# Patient Record
Sex: Female | Born: 1985 | Race: White | Hispanic: No | Marital: Single | Smoking: Never smoker
Health system: Southern US, Community
[De-identification: ages and names within clinical notes are randomized; demographics above are authoritative.]

## PROBLEM LIST (undated history)

## (undated) DIAGNOSIS — I471 Supraventricular tachycardia: Secondary | ICD-10-CM

## (undated) DIAGNOSIS — I469 Cardiac arrest, cause unspecified: Secondary | ICD-10-CM

## (undated) DIAGNOSIS — R001 Bradycardia, unspecified: Secondary | ICD-10-CM

## (undated) DIAGNOSIS — K5090 Crohn's disease, unspecified, without complications: Secondary | ICD-10-CM

## (undated) DIAGNOSIS — Z9289 Personal history of other medical treatment: Secondary | ICD-10-CM

## (undated) DIAGNOSIS — F329 Major depressive disorder, single episode, unspecified: Secondary | ICD-10-CM

## (undated) DIAGNOSIS — T7840XA Allergy, unspecified, initial encounter: Secondary | ICD-10-CM

## (undated) DIAGNOSIS — F32A Depression, unspecified: Secondary | ICD-10-CM

## (undated) DIAGNOSIS — G43909 Migraine, unspecified, not intractable, without status migrainosus: Secondary | ICD-10-CM

## (undated) DIAGNOSIS — D61818 Other pancytopenia: Secondary | ICD-10-CM

## (undated) DIAGNOSIS — Z8679 Personal history of other diseases of the circulatory system: Secondary | ICD-10-CM

## (undated) DIAGNOSIS — Z95810 Presence of automatic (implantable) cardiac defibrillator: Secondary | ICD-10-CM

## (undated) DIAGNOSIS — Z9889 Other specified postprocedural states: Secondary | ICD-10-CM

## (undated) HISTORY — PX: ABLATION: SHX5711

## (undated) HISTORY — DX: Migraine, unspecified, not intractable, without status migrainosus: G43.909

## (undated) HISTORY — DX: Other pancytopenia: D61.818

## (undated) HISTORY — DX: Crohn's disease, unspecified, without complications: K50.90

## (undated) HISTORY — DX: Supraventricular tachycardia: I47.1

## (undated) HISTORY — DX: Allergy, unspecified, initial encounter: T78.40XA

---
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
## 2012-06-29 ENCOUNTER — Other Ambulatory Visit: Payer: Self-pay | Admitting: Gastroenterology

## 2012-06-29 DIAGNOSIS — R109 Unspecified abdominal pain: Secondary | ICD-10-CM

## 2012-07-02 ENCOUNTER — Ambulatory Visit
Admission: RE | Admit: 2012-07-02 | Discharge: 2012-07-02 | Disposition: A | Discharge status: Home / Self Care | Payer: 59 | Source: Ambulatory Visit | Attending: Gastroenterology | Admitting: Gastroenterology

## 2012-07-02 DIAGNOSIS — R109 Unspecified abdominal pain: Secondary | ICD-10-CM

## 2012-09-10 DIAGNOSIS — Z9289 Personal history of other medical treatment: Secondary | ICD-10-CM

## 2012-09-10 HISTORY — DX: Personal history of other medical treatment: Z92.89

## 2012-09-15 ENCOUNTER — Telehealth: Payer: Self-pay | Admitting: Oncology

## 2012-09-15 NOTE — Telephone Encounter (Signed)
C/D 09/15/12 for appt. 09/23/12

## 2012-09-15 NOTE — Telephone Encounter (Signed)
S/W pt mother in re NP appt 11/14 @ 10:30 w/Dr. Alen Blew Referring Dr. Donnie Coffin Dx-Pancytopenia Welcome packet mailed.

## 2012-09-23 ENCOUNTER — Encounter: Payer: Self-pay | Admitting: Oncology

## 2012-09-23 ENCOUNTER — Telehealth: Payer: Self-pay | Admitting: Oncology

## 2012-09-23 ENCOUNTER — Other Ambulatory Visit (HOSPITAL_COMMUNITY)
Admission: RE | Admit: 2012-09-23 | Discharge: 2012-09-23 | Disposition: A | Discharge status: Home / Self Care | Payer: 59 | Source: Ambulatory Visit | Attending: Oncology | Admitting: Oncology

## 2012-09-23 ENCOUNTER — Ambulatory Visit (HOSPITAL_BASED_OUTPATIENT_CLINIC_OR_DEPARTMENT_OTHER): Payer: 59

## 2012-09-23 ENCOUNTER — Ambulatory Visit: Payer: 59 | Admitting: Lab

## 2012-09-23 ENCOUNTER — Other Ambulatory Visit (HOSPITAL_BASED_OUTPATIENT_CLINIC_OR_DEPARTMENT_OTHER): Payer: 59 | Admitting: Lab

## 2012-09-23 ENCOUNTER — Ambulatory Visit (HOSPITAL_BASED_OUTPATIENT_CLINIC_OR_DEPARTMENT_OTHER): Payer: 59 | Admitting: Oncology

## 2012-09-23 ENCOUNTER — Encounter (HOSPITAL_COMMUNITY)
Admission: RE | Admit: 2012-09-23 | Discharge: 2012-09-23 | Disposition: A | Discharge status: Home / Self Care | Payer: 59 | Source: Ambulatory Visit | Attending: Oncology | Admitting: Oncology

## 2012-09-23 VITALS — BP 111/69 | HR 68 | Temp 97.7°F | Resp 20 | Ht 65.5 in | Wt 176.9 lb

## 2012-09-23 DIAGNOSIS — D61818 Other pancytopenia: Secondary | ICD-10-CM | POA: Insufficient documentation

## 2012-09-23 DIAGNOSIS — D72820 Lymphocytosis (symptomatic): Secondary | ICD-10-CM

## 2012-09-23 DIAGNOSIS — E876 Hypokalemia: Secondary | ICD-10-CM

## 2012-09-23 DIAGNOSIS — C8589 Other specified types of non-Hodgkin lymphoma, extranodal and solid organ sites: Secondary | ICD-10-CM | POA: Insufficient documentation

## 2012-09-23 LAB — CBC WITH DIFFERENTIAL/PLATELET
BASO%: 0 % (ref 0.0–2.0)
Basophils Absolute: 0 10*3/uL (ref 0.0–0.1)
EOS%: 2 % (ref 0.0–7.0)
HGB: 5.5 g/dL — CL (ref 11.6–15.9)
MCH: 27 pg (ref 25.1–34.0)
MONO%: 7.4 % (ref 0.0–14.0)
RBC: 2.04 10*6/uL — ABNORMAL LOW (ref 3.70–5.45)
RDW: 14.6 % — ABNORMAL HIGH (ref 11.2–14.5)
lymph#: 1.7 10*3/uL (ref 0.9–3.3)
nRBC: 0 % (ref 0–0)

## 2012-09-23 LAB — COMPREHENSIVE METABOLIC PANEL (CC13)
ALT: <6 U/L (ref 0–55)
AST: 7 U/L (ref 5–34)
Albumin: 3.1 g/dL — ABNORMAL LOW (ref 3.5–5.0)
Alkaline Phosphatase: 47 U/L (ref 40–150)
Glucose: 95 mg/dl (ref 70–99)
Potassium: 2.3 mEq/L — CL (ref 3.5–5.1)
Sodium: 139 mEq/L (ref 136–145)
Total Bilirubin: 0.53 mg/dL (ref 0.20–1.20)
Total Protein: 7 g/dL (ref 6.4–8.3)

## 2012-09-23 LAB — ABO/RH: ABO/RH(D): O NEG

## 2012-09-23 LAB — TECHNOLOGIST REVIEW

## 2012-09-23 MED ORDER — POTASSIUM CHLORIDE ER 10 MEQ PO TBCR: 10.0000 meq | EXTENDED_RELEASE_TABLET | Freq: Two times a day (BID) | ORAL | Status: DC

## 2012-09-23 NOTE — Telephone Encounter (Signed)
appts made and printed for pt aom °

## 2012-09-23 NOTE — Progress Notes (Signed)
CC:   Shirley Friar, MD L. Lupe Carney, M.D.  REASON FOR CONSULTATION:  Pancytopenia.  HISTORY OF PRESENT ILLNESS:  Julie Saunders is a pleasant 26 year old woman with past medical history significant for Crohn disease.  She currently lives in [place] with her parents and was diagnosed with Crohn disease about 11 years ago.  At that time she had presented with malnutrition and appeared to be iron deficient and was losing hair and having diarrhea, and at that time, again, diagnosed with that particular disease.  She was treated with Pentasa on a regular basis. She also was started on Azasan a few years ago, but due to insurance issues, she had been off of it for an extended period of time.  She restarted the Azasan back on September 30th or so, as far as she can remember, due to a flare in her Crohn disease that had been up until that point under reasonable control.  Subsequent to that, in the last 2 months she has had major deterioration in her health including weakness, fatigue, tiredness, and progressive pancytopenia.  Her CBC back in September before she started this particular medication she had a white cell count of 14.5, hemoglobin of 12.5, and platelet count was 371. That was on 07/27/2012.  A lymphocyte percentage at that time was normal at 24.6.  A repeat CBC after that on 08/17/2012 showed her white cell count was down to 3.4, hemoglobin was 11.9, and platelet count of 114, after she had been on Azasan for about a few weeks.  A repeat CBC back on November 5th showed her white cell count of down to 2.4, hemoglobin was 7.3, and platelet count of 86.  At that time, her neutrophil percentage was up to 78.7.  At that time, she started developing more symptoms of weakness, fatigue, tiredness, more nausea, poor p.o. intake. The patient was referred to me for evaluation regarding her pancytopenia.  The patient was instructed to stop Azasan at that time and she is  scheduled for an endoscopy by Dr. Bosie Clos.  She presents today accompanied by her parents.  As mentioned, she has been reporting weakness, fatigue, tiredness.  She is not reporting any chest pain.  She is not reporting any shortness of breath, not reporting any syncopal episode, but really has very poor p.o. intake.  She takes soups.  She has not had any dizzy spells, but she had to use a wheelchair today in fear that she might fall.  She did not report any hematochezia, did not have any melena.  She did not report any hemoptysis, did not report any hematemesis.  REVIEW OF SYSTEMS:  Does not report any headaches, blurred vision, double vision.  Does not report any motor or sensory neuropathy.  Does not report any alteration in mental status.  Does not report any psychiatric issues.  Does report worsening headaches.  Does not report any chest pain, orthopnea, PND, palpitation.  Does not report any cough or hemoptysis.  Does not report any hematochezia.  Does not report any melena.  Does not report any abdominal pain.  Does not report any diarrhea.  Does not report any musculoskeletal complaints.  Does report weakness, fatigue, and generalized achiness.  Does not report any abdominal distention.  Rest of review of systems unremarkable.  PAST MEDICAL HISTORY:  Significant for Crohn disease, migraine headaches.  Also has history of allergies.  ALLERGIES:  Sulfa.  MEDICATIONS:  She is on Pentasa.  She is no longer taking Azasan  as of last week.  She is on folic acid, cetirizine, iron sulfate, Imitrex, Zofran, and Tylenol.  She is on Imodium AD as needed.  SOCIAL HISTORY:  She is single.  She lives with her parents.  Denied any alcohol or tobacco abuse.  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  FAMILY HISTORY:  Not significant for any blood disorders or any Crohn disease.  There is history of hypertension and diabetes.  PHYSICAL EXAM:  General:  Alert, awake, pale-appearing woman.  Did  not appear in any active distress.  Vital Signs:  Her blood pressure today is 111/69, pulse 68, respirations 20, weighs 176 pounds.  HEENT:  Head is normocephalic, atraumatic.  Pupils equal, round, and reactive to light.  Oral mucosa moist and pink.  Neck:  Supple without adenopathy. Heart:  Regular rate and rhythm.  S1 and S2.  Lungs:  Clear to auscultation without wheeze or dullness to percussion.  Abdomen:  Soft, nontender.  No hepatosplenomegaly.  Extremities:  No edema.  LABORATORY DATA:  Showed a hemoglobin of 5.5, white cell count of 2.4, platelet count of 42.  Her lymphocyte percentage is up to 68%, absolute neutrophil count of 500.  Peripheral smear was personally reviewed today, showed an overall generalized pancytopenia.  Could not appreciate any blasts.  Lymphocytes appeared reactive, slightly cleaved, but no evidence of any dysplasia.  Not any schistocytosis or red cell fragmentation or platelet clumping.  ASSESSMENT AND PLAN:  A 26 year old woman with the following issues:  1. Pancytopenia with lymphocytosis.  The differential diagnosis     discussed today with Tychelle and her parents.  I feel the most     likely offending agent is the immunosuppressive drug Azasan, or     azathioprine, or Imuran, a medication that is known to cause     progressive cytopenias and some progressive bone marrow     suppression.  However, she does have lymphocytosis.  I am going to     always believe the possibility of a lymphoproliferative disorder or     leukemia as a possibility.  I think these are unlikely, given the     fact that exactly less than 6 weeks ago she had a normal CBC and     the timing of starting this medication corresponds perfectly.  She     could also be developing an aplastic bone marrow related to that.     In terms of a management standpoint, I will send her peripheral     blood for flow cytometry to make sure it does not have a     lymphoproliferative disorder and I  will support her with     transfusion for the time being.  I have talked to her about a bone     marrow biopsy, if her counts do not recover in the next couple of     weeks.  All her questions were answered today.  She is agreeable to     the plan. 2. Hypokalemia.  Her potassium is low today.  It is probably related     to her poor nutrition, possibly due to her diarrhea.  I have given     her potassium supplements.  I have given her clear instructions     today if she develops any progressive weakness, fatigue, tiredness,     syncope chest pain, to present to the emergency department right     away as she is at risk of more severe metabolic derangements.  ______________________________ Benjiman Core, M.D. FNS/MEDQ  D:  09/23/2012  T:  09/23/2012  Job:  [id number]

## 2012-09-23 NOTE — Progress Notes (Signed)
Note dictated

## 2012-09-23 NOTE — Progress Notes (Signed)
Checked in new patient. No financial issues. °

## 2012-09-24 ENCOUNTER — Ambulatory Visit: Payer: 59

## 2012-09-24 VITALS — BP 105/61 | HR 79 | Temp 97.5°F | Resp 20

## 2012-09-24 DIAGNOSIS — D61818 Other pancytopenia: Secondary | ICD-10-CM

## 2012-09-24 LAB — PREPARE RBC (CROSSMATCH)

## 2012-09-24 MED ORDER — SODIUM CHLORIDE 0.9 % IV SOLN
250.0000 mL | Freq: Once | INTRAVENOUS | Status: AC
  Administered 2012-09-24: 250 mL via INTRAVENOUS

## 2012-09-24 NOTE — Patient Instructions (Addendum)
Blood Transfusion Information WHAT IS A BLOOD TRANSFUSION? A transfusion is the replacement of blood or some of its parts. Blood is made up of multiple cells which provide different functions.  Red blood cells carry oxygen and are used for blood loss replacement.  White blood cells fight against infection.  Platelets control bleeding.  Plasma helps clot blood.  Other blood products are available for specialized needs, such as hemophilia or other clotting disorders. BEFORE THE TRANSFUSION  Who gives blood for transfusions?   You may be able to donate blood to be used at a later date on yourself (autologous donation).  Relatives can be asked to donate blood. This is generally not any safer than if you have received blood from a stranger. The same precautions are taken to ensure safety when a relative's blood is donated.  Healthy volunteers who are fully evaluated to make sure their blood is safe. This is blood bank blood. Transfusion therapy is the safest it has ever been in the practice of medicine. Before blood is taken from a donor, a complete history is taken to make sure that person has no history of diseases nor engages in risky social behavior (examples are intravenous drug use or sexual activity with multiple partners). The donor's travel history is screened to minimize risk of transmitting infections, such as malaria. The donated blood is tested for signs of infectious diseases, such as HIV and hepatitis. The blood is then tested to be sure it is compatible with you in order to minimize the chance of a transfusion reaction. If you or a relative donates blood, this is often done in anticipation of surgery and is not appropriate for emergency situations. It takes many days to process the donated blood. RISKS AND COMPLICATIONS Although transfusion therapy is very safe and saves many lives, the main dangers of transfusion include:   Getting an infectious disease.  Developing a  transfusion reaction. This is an allergic reaction to something in the blood you were given. Every precaution is taken to prevent this. The decision to have a blood transfusion has been considered carefully by your caregiver before blood is given. Blood is not given unless the benefits outweigh the risks. AFTER THE TRANSFUSION  Right after receiving a blood transfusion, you will usually feel much better and more energetic. This is especially true if your red blood cells have gotten low (anemic). The transfusion raises the level of the red blood cells which carry oxygen, and this usually causes an energy increase.  The nurse administering the transfusion will monitor you carefully for complications. HOME CARE INSTRUCTIONS  No special instructions are needed after a transfusion. You may find your energy is better. Speak with your caregiver about any limitations on activity for underlying diseases you may have. SEEK MEDICAL CARE IF:   Your condition is not improving after your transfusion.  You develop redness or irritation at the intravenous (IV) site. SEEK IMMEDIATE MEDICAL CARE IF:  Any of the following symptoms occur over the next 12 hours:  Shaking chills.  You have a temperature by mouth above 102 F (38.9 C), not controlled by medicine.  Chest, back, or muscle pain.  People around you feel you are not acting correctly or are confused.  Shortness of breath or difficulty breathing.  Dizziness and fainting.  You get a rash or develop hives.  You have a decrease in urine output.  Your urine turns a dark color or changes to pink, red, or brown. Any of the following   symptoms occur over the next 10 days:  You have a temperature by mouth above 102 F (38.9 C), not controlled by medicine.  Shortness of breath.  Weakness after normal activity.  The white part of the eye turns yellow (jaundice).  You have a decrease in the amount of urine or are urinating less often.  Your  urine turns a dark color or changes to pink, red, or brown. Document Released: 10/24/2000 Document Revised: 01/19/2012 Document Reviewed: 06/12/2008 Exit[employer] Patient Information 2013 [employer].  

## 2012-09-25 LAB — TYPE AND SCREEN
Antibody Screen: NEGATIVE
Unit division: 0

## 2012-09-29 ENCOUNTER — Other Ambulatory Visit: Payer: Self-pay | Admitting: Gastroenterology

## 2012-09-30 ENCOUNTER — Other Ambulatory Visit: Payer: Self-pay | Admitting: Oncology

## 2012-09-30 DIAGNOSIS — D649 Anemia, unspecified: Secondary | ICD-10-CM

## 2012-10-06 ENCOUNTER — Other Ambulatory Visit (HOSPITAL_BASED_OUTPATIENT_CLINIC_OR_DEPARTMENT_OTHER): Payer: 59 | Admitting: Lab

## 2012-10-06 ENCOUNTER — Ambulatory Visit (HOSPITAL_BASED_OUTPATIENT_CLINIC_OR_DEPARTMENT_OTHER): Payer: 59 | Admitting: Oncology

## 2012-10-06 ENCOUNTER — Telehealth: Payer: Self-pay | Admitting: Oncology

## 2012-10-06 ENCOUNTER — Telehealth: Payer: Self-pay | Admitting: *Deleted

## 2012-10-06 VITALS — BP 118/77 | HR 85 | Temp 98.5°F | Resp 18 | Ht 65.5 in | Wt 179.2 lb

## 2012-10-06 DIAGNOSIS — D61818 Other pancytopenia: Secondary | ICD-10-CM

## 2012-10-06 DIAGNOSIS — D72820 Lymphocytosis (symptomatic): Secondary | ICD-10-CM

## 2012-10-06 DIAGNOSIS — D649 Anemia, unspecified: Secondary | ICD-10-CM

## 2012-10-06 DIAGNOSIS — E876 Hypokalemia: Secondary | ICD-10-CM

## 2012-10-06 LAB — CBC WITH DIFFERENTIAL/PLATELET
BASO%: 0.2 % (ref 0.0–2.0)
EOS%: 0.4 % (ref 0.0–7.0)
HCT: 27 % — ABNORMAL LOW (ref 34.8–46.6)
HGB: 9.2 g/dL — ABNORMAL LOW (ref 11.6–15.9)
MCH: 30.3 pg (ref 25.1–34.0)
MCHC: 34.1 g/dL (ref 31.5–36.0)
MONO#: 1.1 10*3/uL — ABNORMAL HIGH (ref 0.1–0.9)
NEUT%: 43.9 % (ref 38.4–76.8)
RDW: 16.3 % — ABNORMAL HIGH (ref 11.2–14.5)
WBC: 6.6 10*3/uL (ref 3.9–10.3)
lymph#: 2.6 10*3/uL (ref 0.9–3.3)

## 2012-10-06 LAB — COMPREHENSIVE METABOLIC PANEL (CC13)
ALT: 8 U/L (ref 0–55)
AST: 10 U/L (ref 5–34)
Albumin: 3 g/dL — ABNORMAL LOW (ref 3.5–5.0)
Alkaline Phosphatase: 54 U/L (ref 40–150)
BUN: 7 mg/dL (ref 7.0–26.0)
Calcium: 8.9 mg/dL (ref 8.4–10.4)
Chloride: 103 mEq/L (ref 98–107)
Potassium: 4.2 mEq/L (ref 3.5–5.1)
Sodium: 138 mEq/L (ref 136–145)
Total Protein: 6.9 g/dL (ref 6.4–8.3)

## 2012-10-06 NOTE — Progress Notes (Signed)
Hematology and Oncology Follow Up Visit  ADEA GEISEL [id number] Mar 21, 1986 26 y.o. 10/06/2012 8:43 AM No primary provider on file.No ref. provider found   Principle Diagnosis: 26 year old with pancytopenia due to Azasan, resolving now.    Prior Therapy: S/P 2 units of PRBC on 11/14 due to symptomatic anemia.   Current therapy: Supportive care.  Interim History: Ms. Julie Saunders presents today for a follow up visit. She is a nice women I saw back in on 11/14 for pancytopenia which was likely related to Azasan. When I saw her on 11/14 her hgb 5.5 and very symptomatic. After 2 units of transfusion, she felt a lot better. Her headaches has resolved. She is eating better. No diarrhea , weakness or vomiting. No bleeding noted.  Her energy and performance status has improved in the last 2 weeks.   Medications: I have reviewed the patient's current medications. Current outpatient prescriptions:acetaminophen (TYLENOL) 325 MG tablet, Take 650 mg by mouth every 6 (six) hours as needed., Disp: , Rfl: ;  cetirizine (ZYRTEC) 10 MG tablet, Take 10 mg by mouth daily., Disp: , Rfl: ;  fluticasone (FLONASE) 50 MCG/ACT nasal spray, Place 50 mcg into the nose Daily. Stop taking when symptoms improve, Disp: , Rfl: ;  folic acid (FOLVITE) 1 MG tablet, Take 1 mg by mouth daily., Disp: , Rfl:  loperamide (IMODIUM A-D) 2 MG tablet, Take 2 mg by mouth 4 (four) times daily as needed., Disp: , Rfl: ;  mesalamine (PENTASA) 250 MG CR capsule, Take 1,000 mg by mouth 4 (four) times daily., Disp: , Rfl: ;  ondansetron (ZOFRAN) 8 MG tablet, Take 8 mg by mouth Three times daily as needed., Disp: , Rfl: ;  potassium chloride (K-DUR) 10 MEQ tablet, Take 1 tablet (10 mEq total) by mouth 2 (two) times daily., Disp: 60 tablet, Rfl: 1 SUMAtriptan (IMITREX) 100 MG tablet, Take 100 mg by mouth every 2 (two) hours as needed. migraine, Disp: , Rfl:   Allergies:  Allergies  Allergen Reactions  . Sulfa Antibiotics     Past Medical History,  Surgical history, Social history, and Family History were reviewed and updated.  Review of Systems: Constitutional:  Negative for fever, chills, night sweats, anorexia, weight loss, pain. Cardiovascular: no chest pain or dyspnea on exertion Respiratory: no cough, shortness of breath, or wheezing Neurological: negative Dermatological: negative ENT: negative Skin: Negative. Gastrointestinal: negative Genito-Urinary: negative Hematological and Lymphatic: negative Breast: negative Musculoskeletal: negative Remaining ROS negative. Physical Exam: Blood pressure 118/77, pulse 85, temperature 98.5 F (36.9 C), temperature source Oral, resp. rate 18, height 5' 5.5" (1.664 m), weight 179 lb 3.2 oz (81.285 kg). ECOG: 1 General appearance: alert Head: Normocephalic, without obvious abnormality, atraumatic Neck: no adenopathy, no carotid bruit, no JVD, supple, symmetrical, trachea midline and thyroid not enlarged, symmetric, no tenderness/mass/nodules Lymph nodes: Cervical, supraclavicular, and axillary nodes normal. Heart:regular rate and rhythm, S1, S2 normal, no murmur, click, rub or gallop Lung:chest clear, no wheezing, rales, normal symmetric air entry Abdomin: soft, non-tender, without masses or organomegaly EXT:no erythema, induration, or nodules   Lab Results: Lab Results  Component Value Date   WBC 6.6 10/06/2012   HGB 9.2* 10/06/2012   HCT 27.0* 10/06/2012   MCV 88.9 10/06/2012   PLT 303 10/06/2012            Impression and Plan:  A 26 year old woman with the following issues:  1. Pancytopenia with lymphocytosis. This is most likely related to Azasan. Since stopping this medication, her counts  nearly back to normal.  I do not think she has a bone marrow disorder (lymphoma, leukemia, MDS, etc..) Her flow cytometry is normal.  She does not need any transfusions or growth factor support.  I would recommend not to treat her with Azasan any more and try a  non-myelosuppressive agent to treat her IBD.  I will check he counts in 6 weeks. If all still normal, no further hematology work up or follow up will be needed.   2. Hypokalemia: this is related to poor po intake or dehydration. We will check her K levels today and determine weather she needs more supplement.   [facility], MD 11/27/20138:43 AM

## 2012-10-06 NOTE — Progress Notes (Signed)
Pt seen by provider before RN able to do assessment.

## 2012-10-06 NOTE — Telephone Encounter (Signed)
gv and printed pt appt for Jan 2014

## 2012-10-06 NOTE — Telephone Encounter (Signed)
Left pt message that potassium level is normal per Dr Clelia Croft. Instructed to stop taking potassium, to call if has questions

## 2012-11-23 ENCOUNTER — Encounter: Payer: Self-pay | Admitting: Oncology

## 2012-11-23 ENCOUNTER — Ambulatory Visit (HOSPITAL_BASED_OUTPATIENT_CLINIC_OR_DEPARTMENT_OTHER): Payer: 59 | Admitting: Oncology

## 2012-11-23 ENCOUNTER — Telehealth: Payer: Self-pay | Admitting: Oncology

## 2012-11-23 ENCOUNTER — Other Ambulatory Visit (HOSPITAL_BASED_OUTPATIENT_CLINIC_OR_DEPARTMENT_OTHER): Payer: 59

## 2012-11-23 VITALS — BP 136/72 | HR 55 | Temp 98.3°F | Resp 20 | Ht 65.5 in | Wt 190.0 lb

## 2012-11-23 DIAGNOSIS — E876 Hypokalemia: Secondary | ICD-10-CM

## 2012-11-23 DIAGNOSIS — D61818 Other pancytopenia: Secondary | ICD-10-CM

## 2012-11-23 DIAGNOSIS — D649 Anemia, unspecified: Secondary | ICD-10-CM

## 2012-11-23 HISTORY — DX: Other pancytopenia: D61.818

## 2012-11-23 LAB — COMPREHENSIVE METABOLIC PANEL (CC13)
ALT: 10 U/L (ref 0–55)
AST: 11 U/L (ref 5–34)
Albumin: 3.2 g/dL — ABNORMAL LOW (ref 3.5–5.0)
Alkaline Phosphatase: 61 U/L (ref 40–150)
BUN: 7 mg/dL (ref 7.0–26.0)
Chloride: 102 mEq/L (ref 98–107)
Potassium: 3 mEq/L — ABNORMAL LOW (ref 3.5–5.1)
Sodium: 139 mEq/L (ref 136–145)

## 2012-11-23 LAB — CBC WITH DIFFERENTIAL/PLATELET
BASO%: 0.2 % (ref 0.0–2.0)
Basophils Absolute: 0 10*3/uL (ref 0.0–0.1)
EOS%: 1.1 % (ref 0.0–7.0)
MCH: 35.4 pg — ABNORMAL HIGH (ref 25.1–34.0)
MCHC: 34.5 g/dL (ref 31.5–36.0)
MCV: 102.6 fL — ABNORMAL HIGH (ref 79.5–101.0)
MONO%: 13 % (ref 0.0–14.0)
RBC: 3.47 10*6/uL — ABNORMAL LOW (ref 3.70–5.45)
RDW: 14.3 % (ref 11.2–14.5)
lymph#: 2.3 10*3/uL (ref 0.9–3.3)

## 2012-11-23 NOTE — Progress Notes (Signed)
Hematology and Oncology Follow Up Visit  Julie Saunders [id number] 1986-02-18 26 y.o. 11/23/2012 11:39 AM No primary provider on file.No ref. provider found   Principle Diagnosis: 27 year old with pancytopenia due to Azasan, resolving now.   Prior Therapy: S/P 2 units of PRBC on 11/14 due to symptomatic anemia.   Current therapy: Supportive care.  Interim History: Julie Saunders presents today for a follow up visit. She is a nice women seen in 09/2012 for pancytopenia which was likely related to Azasan. When I saw her on 11/14 her hgb 5.5 and very symptomatic. After 2 units of transfusion, she felt a lot better. Her headaches has resolved. She is eating better. No diarrhea , weakness or vomiting. No bleeding noted. She remains off Azasan. Fatigue is better.  Medications: I have reviewed the patient's current medications. Current outpatient prescriptions:acetaminophen (TYLENOL) 325 MG tablet, Take 650 mg by mouth every 6 (six) hours as needed., Disp: , Rfl: ;  cetirizine (ZYRTEC) 10 MG tablet, Take 10 mg by mouth daily., Disp: , Rfl: ;  folic acid (FOLVITE) 1 MG tablet, Take 1 mg by mouth daily., Disp: , Rfl: ;  loperamide (IMODIUM A-D) 2 MG tablet, Take 2 mg by mouth 4 (four) times daily as needed., Disp: , Rfl:  mesalamine (PENTASA) 250 MG CR capsule, Take 1,000 mg by mouth 4 (four) times daily., Disp: , Rfl: ;  ondansetron (ZOFRAN) 8 MG tablet, Take 8 mg by mouth Three times daily as needed., Disp: , Rfl: ;  SUMAtriptan (IMITREX) 100 MG tablet, Take 100 mg by mouth every 2 (two) hours as needed. migraine, Disp: , Rfl:   Allergies:  Allergies  Allergen Reactions  . Sulfa Antibiotics     Past Medical History, Surgical history, Social history, and Family History were reviewed and updated.  Review of Systems: Constitutional:  Negative for fever, chills, night sweats, anorexia, weight loss, pain. Cardiovascular: no chest pain or dyspnea on exertion Respiratory: no cough, shortness of breath, or  wheezing Neurological: negative Dermatological: negative ENT: negative Skin: Negative. Gastrointestinal: negative Genito-Urinary: negative Hematological and Lymphatic: negative Breast: negative Musculoskeletal: negative Remaining ROS negative.  Physical Exam: Blood pressure 136/72, pulse 55, temperature 98.3 F (36.8 C), temperature source Oral, resp. rate 20, height 5' 5.5" (1.664 m), weight 189 lb 15.4 oz (86.165 kg). ECOG: 1 General appearance: alert Head: Normocephalic, without obvious abnormality, atraumatic Neck: no adenopathy, no carotid bruit, no JVD, supple, symmetrical, trachea midline and thyroid not enlarged, symmetric, no tenderness/mass/nodules Lymph nodes: Cervical, supraclavicular, and axillary nodes normal. Heart:regular rate and rhythm, S1, S2 normal, no murmur, click, rub or gallop Lung:chest clear, no wheezing, rales, normal symmetric air entry Abdomen: soft, non-tender, without masses or organomegaly EXT:no erythema, induration, or nodules   Lab Results: Lab Results  Component Value Date   WBC 7.8 11/23/2012   HGB 12.3 11/23/2012   HCT 35.6 11/23/2012   MCV 102.6* 11/23/2012   PLT 220 11/23/2012            Impression and Plan:  A 27 year old woman with the following issues:  1. Pancytopenia with lymphocytosis. This is most likely related to Azasan. Since stopping this medication, her counts have normalized. I do not think she has a bone marrow disorder (lymphoma, leukemia, MDS, etc..) Her flow cytometry is normal. She does not need any transfusions or growth factor support. I would recommend not to treat her with Azasan any more and try a non-myelosuppressive agent to treat her IBD.  2. Hypokalemia: this is  related to poor po intake or dehydration. We will check her K levels today and determine weather she needs more supplement.   3. Follow-up. In 3 months.  [place], [place] 1/14/201411:39 AM

## 2012-11-23 NOTE — Telephone Encounter (Signed)
gv and printed appt schedule for pt for April...the patient aware...the patient requested 4.17.14 due to work schedule

## 2012-11-23 NOTE — Patient Instructions (Addendum)
Results for AMIRACLE, NEISES (MRN [id number]) as of 11/23/2012 10:46  Ref. Range 11/23/2012 10:21  WBC Latest Range: 3.9-10.3 10e3/uL 7.8  RBC Latest Range: 3.70-5.45 10e6/uL 3.47 (L)  Hemoglobin Latest Range: 11.6-15.9 g/dL 40.9  HCT Latest Range: 34.8-46.6 % 35.6  MCV Latest Range: 79.5-101.0 fL 102.6 (H)  MCH Latest Range: 25.1-34.0 pg 35.4 (H)  MCHC Latest Range: 31.5-36.0 g/dL 81.1  RDW Latest Range: 11.2-14.5 % 14.3  Platelets Latest Range: 145-400 10e3/uL 220

## 2013-02-24 ENCOUNTER — Ambulatory Visit (HOSPITAL_BASED_OUTPATIENT_CLINIC_OR_DEPARTMENT_OTHER): Payer: 59 | Admitting: Oncology

## 2013-02-24 ENCOUNTER — Other Ambulatory Visit (HOSPITAL_BASED_OUTPATIENT_CLINIC_OR_DEPARTMENT_OTHER): Payer: 59 | Admitting: Lab

## 2013-02-24 VITALS — BP 146/82 | HR 69 | Temp 97.7°F | Resp 20 | Ht 65.5 in | Wt 199.2 lb

## 2013-02-24 DIAGNOSIS — D61818 Other pancytopenia: Secondary | ICD-10-CM

## 2013-02-24 LAB — CBC WITH DIFFERENTIAL/PLATELET
BASO%: 0.3 % (ref 0.0–2.0)
Basophils Absolute: 0 10*3/uL (ref 0.0–0.1)
EOS%: 1.9 % (ref 0.0–7.0)
HCT: 40.3 % (ref 34.8–46.6)
HGB: 13 g/dL (ref 11.6–15.9)
MCH: 30.7 pg (ref 25.1–34.0)
MCHC: 32.3 g/dL (ref 31.5–36.0)
MCV: 95 fL (ref 79.5–101.0)
MONO%: 12.3 % (ref 0.0–14.0)
NEUT%: 55.9 % (ref 38.4–76.8)
RDW: 12.8 % (ref 11.2–14.5)

## 2013-02-24 LAB — COMPREHENSIVE METABOLIC PANEL (CC13)
ALT: 9 U/L (ref 0–55)
AST: 13 U/L (ref 5–34)
Alkaline Phosphatase: 57 U/L (ref 40–150)
BUN: 6.9 mg/dL — ABNORMAL LOW (ref 7.0–26.0)
Creatinine: 0.8 mg/dL (ref 0.6–1.1)

## 2013-02-24 NOTE — Progress Notes (Signed)
Hematology and Oncology Follow Up Visit  Julie Saunders [id number] 27-Aug-1986 26 y.o. 02/24/2013 10:50 AM Benita Stabile, MDNo ref. provider found   Principle Diagnosis: 27 year old with pancytopenia due to Azasan, resolved now.   Prior Therapy: S/P 2 units of PRBC on 11/14 due to symptomatic anemia.   Current therapy: Supportive care.  Interim History: Julie Saunders presents today for a follow up visit. She is a nice women seen in 09/2012 for pancytopenia which was likely related to Azasan. When I saw her on 11/14 her hgb 5.5 and very symptomatic. After 2 units of transfusion, she felt a lot better. Her headaches has resolved. She is eating better. No diarrhea , weakness or vomiting. No bleeding noted. She remains off Azasan. Fatigue is better. No new complaints at this time and not reporting any colitis flair up.   Medications: I have reviewed the patient's current medications. Current outpatient prescriptions:acetaminophen (TYLENOL) 325 MG tablet, Take 650 mg by mouth every 6 (six) hours as needed., Disp: , Rfl: ;  cetirizine (ZYRTEC) 10 MG tablet, Take 10 mg by mouth daily., Disp: , Rfl: ;  folic acid (FOLVITE) 1 MG tablet, Take 1 mg by mouth daily., Disp: , Rfl: ;  loperamide (IMODIUM A-D) 2 MG tablet, Take 2 mg by mouth 4 (four) times daily as needed., Disp: , Rfl:  mesalamine (PENTASA) 250 MG CR capsule, Take 1,000 mg by mouth 4 (four) times daily., Disp: , Rfl: ;  ondansetron (ZOFRAN) 8 MG tablet, Take 8 mg by mouth Three times daily as needed., Disp: , Rfl: ;  SUMAtriptan (IMITREX) 100 MG tablet, Take 100 mg by mouth every 2 (two) hours as needed. migraine, Disp: , Rfl:   Allergies:  Allergies  Allergen Reactions  . Sulfa Antibiotics     Past Medical History, Surgical history, Social history, and Family History were reviewed and updated.  Review of Systems: Constitutional:  Negative for fever, chills, night sweats, anorexia, weight loss, pain. Cardiovascular: no chest pain or  dyspnea on exertion Respiratory: no cough, shortness of breath, or wheezing Neurological: negative Dermatological: negative ENT: negative Skin: Negative. Gastrointestinal: negative Genito-Urinary: negative Hematological and Lymphatic: negative Breast: negative Musculoskeletal: negative Remaining ROS negative.  Physical Exam: Blood pressure 146/82, pulse 69, temperature 97.7 F (36.5 C), temperature source Oral, resp. rate 20, height 5' 5.5" (1.664 m), weight 199 lb 3.2 oz (90.357 kg). ECOG: 1 General appearance: alert Head: Normocephalic, without obvious abnormality, atraumatic Neck: no adenopathy, no carotid bruit, no JVD, supple, symmetrical, trachea midline and thyroid not enlarged, symmetric, no tenderness/mass/nodules Lymph nodes: Cervical, supraclavicular, and axillary nodes normal. Heart:regular rate and rhythm, S1, S2 normal, no murmur, click, rub or gallop Lung:chest clear, no wheezing, rales, normal symmetric air entry Abdomen: soft, non-tender, without masses or organomegaly EXT:no erythema, induration, or nodules   Lab Results: Lab Results  Component Value Date   WBC 9.8 02/24/2013   HGB 13.0 02/24/2013   HCT 40.3 02/24/2013   MCV 95.0 02/24/2013   PLT 240 02/24/2013            Impression and Plan:  A 27 year old woman with the following issues:  1. Pancytopenia with lymphocytosis. This is most likely related to Azasan. Since stopping this medication, her counts have normalized. I do not think she has a bone marrow disorder (lymphoma, leukemia, MDS, etc..) Her flow cytometry is normal. She does not need any transfusions or growth factor support. I would recommend not to treat her with Azasan any more and try  a non-myelosuppressive agent to treat her IBD.  2. Hypokalemia: this is resolved.   3. Follow-up. As needed.  Arlie Posch 4/17/201410:50 AM

## 2014-08-25 ENCOUNTER — Other Ambulatory Visit: Payer: Self-pay | Admitting: Gastroenterology

## 2014-08-31 ENCOUNTER — Inpatient Hospital Stay (HOSPITAL_COMMUNITY): Payer: 59

## 2014-08-31 ENCOUNTER — Emergency Department (HOSPITAL_COMMUNITY): Payer: 59

## 2014-08-31 ENCOUNTER — Inpatient Hospital Stay (HOSPITAL_COMMUNITY)
Admission: EM | Admit: 2014-08-31 | Discharge: 2014-09-14 | DRG: 226 | Disposition: A | Discharge status: Home Health | Payer: 59 | Attending: Internal Medicine | Admitting: Internal Medicine

## 2014-08-31 ENCOUNTER — Encounter (HOSPITAL_COMMUNITY): Payer: Self-pay | Admitting: Emergency Medicine

## 2014-08-31 DIAGNOSIS — R1010 Upper abdominal pain, unspecified: Secondary | ICD-10-CM

## 2014-08-31 DIAGNOSIS — R739 Hyperglycemia, unspecified: Secondary | ICD-10-CM | POA: Diagnosis present

## 2014-08-31 DIAGNOSIS — K2980 Duodenitis without bleeding: Secondary | ICD-10-CM | POA: Diagnosis present

## 2014-08-31 DIAGNOSIS — R570 Cardiogenic shock: Secondary | ICD-10-CM | POA: Diagnosis present

## 2014-08-31 DIAGNOSIS — D61818 Other pancytopenia: Secondary | ICD-10-CM | POA: Diagnosis present

## 2014-08-31 DIAGNOSIS — R1310 Dysphagia, unspecified: Secondary | ICD-10-CM | POA: Diagnosis present

## 2014-08-31 DIAGNOSIS — D649 Anemia, unspecified: Secondary | ICD-10-CM | POA: Diagnosis present

## 2014-08-31 DIAGNOSIS — I429 Cardiomyopathy, unspecified: Secondary | ICD-10-CM | POA: Diagnosis present

## 2014-08-31 DIAGNOSIS — R451 Restlessness and agitation: Secondary | ICD-10-CM | POA: Diagnosis not present

## 2014-08-31 DIAGNOSIS — Z9889 Other specified postprocedural states: Secondary | ICD-10-CM

## 2014-08-31 DIAGNOSIS — J9601 Acute respiratory failure with hypoxia: Secondary | ICD-10-CM

## 2014-08-31 DIAGNOSIS — D72829 Elevated white blood cell count, unspecified: Secondary | ICD-10-CM | POA: Diagnosis present

## 2014-08-31 DIAGNOSIS — Z781 Physical restraint status: Secondary | ICD-10-CM | POA: Diagnosis not present

## 2014-08-31 DIAGNOSIS — K2970 Gastritis, unspecified, without bleeding: Secondary | ICD-10-CM | POA: Diagnosis present

## 2014-08-31 DIAGNOSIS — R569 Unspecified convulsions: Secondary | ICD-10-CM | POA: Diagnosis present

## 2014-08-31 DIAGNOSIS — K50918 Crohn's disease, unspecified, with other complication: Secondary | ICD-10-CM | POA: Diagnosis present

## 2014-08-31 DIAGNOSIS — G9340 Encephalopathy, unspecified: Secondary | ICD-10-CM

## 2014-08-31 DIAGNOSIS — Z452 Encounter for adjustment and management of vascular access device: Secondary | ICD-10-CM

## 2014-08-31 DIAGNOSIS — I5023 Acute on chronic systolic (congestive) heart failure: Secondary | ICD-10-CM | POA: Diagnosis present

## 2014-08-31 DIAGNOSIS — R4701 Aphasia: Secondary | ICD-10-CM | POA: Diagnosis present

## 2014-08-31 DIAGNOSIS — K5000 Crohn's disease of small intestine without complications: Secondary | ICD-10-CM | POA: Diagnosis present

## 2014-08-31 DIAGNOSIS — G931 Anoxic brain damage, not elsewhere classified: Secondary | ICD-10-CM | POA: Diagnosis present

## 2014-08-31 DIAGNOSIS — Z79899 Other long term (current) drug therapy: Secondary | ICD-10-CM | POA: Diagnosis not present

## 2014-08-31 DIAGNOSIS — E46 Unspecified protein-calorie malnutrition: Secondary | ICD-10-CM | POA: Diagnosis present

## 2014-08-31 DIAGNOSIS — I469 Cardiac arrest, cause unspecified: Secondary | ICD-10-CM

## 2014-08-31 DIAGNOSIS — K7201 Acute and subacute hepatic failure with coma: Secondary | ICD-10-CM | POA: Diagnosis present

## 2014-08-31 DIAGNOSIS — E876 Hypokalemia: Secondary | ICD-10-CM | POA: Diagnosis present

## 2014-08-31 DIAGNOSIS — Z6827 Body mass index (BMI) 27.0-27.9, adult: Secondary | ICD-10-CM | POA: Diagnosis not present

## 2014-08-31 DIAGNOSIS — K7290 Hepatic failure, unspecified without coma: Secondary | ICD-10-CM | POA: Diagnosis present

## 2014-08-31 DIAGNOSIS — E669 Obesity, unspecified: Secondary | ICD-10-CM | POA: Diagnosis present

## 2014-08-31 DIAGNOSIS — M7989 Other specified soft tissue disorders: Secondary | ICD-10-CM | POA: Diagnosis present

## 2014-08-31 DIAGNOSIS — I519 Heart disease, unspecified: Secondary | ICD-10-CM

## 2014-08-31 DIAGNOSIS — I5022 Chronic systolic (congestive) heart failure: Secondary | ICD-10-CM

## 2014-08-31 DIAGNOSIS — E871 Hypo-osmolality and hyponatremia: Secondary | ICD-10-CM | POA: Diagnosis present

## 2014-08-31 DIAGNOSIS — Z95810 Presence of automatic (implantable) cardiac defibrillator: Secondary | ICD-10-CM

## 2014-08-31 DIAGNOSIS — D638 Anemia in other chronic diseases classified elsewhere: Secondary | ICD-10-CM | POA: Diagnosis present

## 2014-08-31 DIAGNOSIS — K50018 Crohn's disease of small intestine with other complication: Secondary | ICD-10-CM

## 2014-08-31 DIAGNOSIS — A419 Sepsis, unspecified organism: Secondary | ICD-10-CM

## 2014-08-31 DIAGNOSIS — N179 Acute kidney failure, unspecified: Secondary | ICD-10-CM | POA: Diagnosis not present

## 2014-08-31 DIAGNOSIS — E872 Acidosis: Secondary | ICD-10-CM | POA: Diagnosis present

## 2014-08-31 DIAGNOSIS — Z9289 Personal history of other medical treatment: Secondary | ICD-10-CM

## 2014-08-31 DIAGNOSIS — R9431 Abnormal electrocardiogram [ECG] [EKG]: Secondary | ICD-10-CM | POA: Diagnosis present

## 2014-08-31 DIAGNOSIS — I4901 Ventricular fibrillation: Principal | ICD-10-CM | POA: Diagnosis present

## 2014-08-31 DIAGNOSIS — E878 Other disorders of electrolyte and fluid balance, not elsewhere classified: Secondary | ICD-10-CM | POA: Diagnosis present

## 2014-08-31 DIAGNOSIS — N170 Acute kidney failure with tubular necrosis: Secondary | ICD-10-CM

## 2014-08-31 DIAGNOSIS — J9600 Acute respiratory failure, unspecified whether with hypoxia or hypercapnia: Secondary | ICD-10-CM

## 2014-08-31 DIAGNOSIS — Z978 Presence of other specified devices: Secondary | ICD-10-CM

## 2014-08-31 DIAGNOSIS — R109 Unspecified abdominal pain: Secondary | ICD-10-CM

## 2014-08-31 DIAGNOSIS — R001 Bradycardia, unspecified: Secondary | ICD-10-CM

## 2014-08-31 DIAGNOSIS — N19 Unspecified kidney failure: Secondary | ICD-10-CM

## 2014-08-31 HISTORY — DX: Cardiac arrest, cause unspecified: I46.9

## 2014-08-31 HISTORY — DX: Bradycardia, unspecified: R00.1

## 2014-08-31 LAB — COMPREHENSIVE METABOLIC PANEL
ALK PHOS: 90 U/L (ref 39–117)
ALT: 24 U/L (ref 0–35)
AST: 66 U/L — ABNORMAL HIGH (ref 0–37)
Albumin: 1.5 g/dL — ABNORMAL LOW (ref 3.5–5.2)
Anion gap: 32 — ABNORMAL HIGH (ref 5–15)
BUN: 4 mg/dL — ABNORMAL LOW (ref 6–23)
CHLORIDE: 93 meq/L — AB (ref 96–112)
CO2: 14 mEq/L — ABNORMAL LOW (ref 19–32)
CREATININE: 0.86 mg/dL (ref 0.50–1.10)
Calcium: 7.6 mg/dL — ABNORMAL LOW (ref 8.4–10.5)
GFR calc non Af Amer: >90 mL/min (ref >90)
GLUCOSE: 415 mg/dL — AB (ref 70–99)
Potassium: 2.6 mEq/L — CL (ref 3.7–5.3)
Sodium: 139 mEq/L (ref 137–147)
TOTAL PROTEIN: 4.7 g/dL — AB (ref 6.0–8.3)
Total Bilirubin: <0.2 mg/dL — ABNORMAL LOW (ref 0.3–1.2)

## 2014-08-31 LAB — CBG MONITORING, ED: Glucose-Capillary: 226 mg/dL — ABNORMAL HIGH (ref 70–99)

## 2014-08-31 LAB — PROTIME-INR
INR: 1.4 (ref 0.00–1.49)
INR: 1.14 (ref 0.00–1.49)
INR: 1.12 (ref 0.00–1.49)
Prothrombin Time: 17.3 seconds — ABNORMAL HIGH (ref 11.6–15.2)
Prothrombin Time: 14.7 seconds (ref 11.6–15.2)
Prothrombin Time: 14.5 seconds (ref 11.6–15.2)

## 2014-08-31 LAB — PHOSPHORUS
PHOSPHORUS: 2.2 mg/dL — AB (ref 2.3–4.6)
Phosphorus: 3.1 mg/dL (ref 2.3–4.6)

## 2014-08-31 LAB — BASIC METABOLIC PANEL
Anion gap: 14 (ref 5–15)
Anion gap: 17 — ABNORMAL HIGH (ref 5–15)
BUN: 4 mg/dL — AB (ref 6–23)
BUN: 4 mg/dL — AB (ref 6–23)
CALCIUM: 6.8 mg/dL — AB (ref 8.4–10.5)
CALCIUM: 6.8 mg/dL — AB (ref 8.4–10.5)
CHLORIDE: 103 meq/L (ref 96–112)
CHLORIDE: 104 meq/L (ref 96–112)
CO2: 22 mEq/L (ref 19–32)
CO2: 18 mEq/L — ABNORMAL LOW (ref 19–32)
CREATININE: 0.98 mg/dL (ref 0.50–1.10)
Creatinine, Ser: 0.91 mg/dL (ref 0.50–1.10)
GFR calc Af Amer: >90 mL/min (ref >90)
GFR calc non Af Amer: 78 mL/min — ABNORMAL LOW (ref >90)
GFR calc non Af Amer: 86 mL/min — ABNORMAL LOW (ref >90)
Glucose, Bld: 172 mg/dL — ABNORMAL HIGH (ref 70–99)
Glucose, Bld: 281 mg/dL — ABNORMAL HIGH (ref 70–99)
Potassium: 2.7 mEq/L — CL (ref 3.7–5.3)
Potassium: 3.2 mEq/L — ABNORMAL LOW (ref 3.7–5.3)
Sodium: 139 mEq/L (ref 137–147)
Sodium: 139 mEq/L (ref 137–147)

## 2014-08-31 LAB — CBC WITH DIFFERENTIAL/PLATELET
Basophils Absolute: 0.2 10*3/uL — ABNORMAL HIGH (ref 0.0–0.1)
Basophils Relative: 1 % (ref 0–1)
Eosinophils Absolute: 0.2 10*3/uL (ref 0.0–0.7)
Eosinophils Relative: 1 % (ref 0–5)
HCT: 37.4 % (ref 36.0–46.0)
Hemoglobin: 12 g/dL (ref 12.0–15.0)
LYMPHS PCT: 49 % — AB (ref 12–46)
Lymphs Abs: 9.3 10*3/uL — ABNORMAL HIGH (ref 0.7–4.0)
MCH: 29.8 pg (ref 26.0–34.0)
MCHC: 32.1 g/dL (ref 30.0–36.0)
MCV: 92.8 fL (ref 78.0–100.0)
MONOS PCT: 7 % (ref 3–12)
Monocytes Absolute: 1.3 10*3/uL — ABNORMAL HIGH (ref 0.1–1.0)
NEUTROS PCT: 42 % — AB (ref 43–77)
Neutro Abs: 8 10*3/uL — ABNORMAL HIGH (ref 1.7–7.7)
PLATELETS: 203 10*3/uL (ref 150–400)
RBC: 4.03 MIL/uL (ref 3.87–5.11)
RDW: 13.8 % (ref 11.5–15.5)
WBC: 19 10*3/uL — AB (ref 4.0–10.5)

## 2014-08-31 LAB — POCT I-STAT, CHEM 8
BUN: <3 mg/dL — ABNORMAL LOW (ref 6–23)
BUN: <3 mg/dL — ABNORMAL LOW (ref 6–23)
BUN: <3 mg/dL — ABNORMAL LOW (ref 6–23)
CALCIUM ION: 1.08 mmol/L — AB (ref 1.12–1.23)
CALCIUM ION: 1 mmol/L — AB (ref 1.12–1.23)
Calcium, Ion: 1.06 mmol/L — ABNORMAL LOW (ref 1.12–1.23)
Calcium, Ion: 1.01 mmol/L — ABNORMAL LOW (ref 1.12–1.23)
Chloride: 100 mEq/L (ref 96–112)
Chloride: 103 mEq/L (ref 96–112)
Chloride: 104 mEq/L (ref 96–112)
Chloride: 103 mEq/L (ref 96–112)
Creatinine, Ser: 1 mg/dL (ref 0.50–1.10)
Creatinine, Ser: 0.9 mg/dL (ref 0.50–1.10)
Creatinine, Ser: 0.9 mg/dL (ref 0.50–1.10)
Creatinine, Ser: 0.8 mg/dL (ref 0.50–1.10)
Glucose, Bld: 176 mg/dL — ABNORMAL HIGH (ref 70–99)
Glucose, Bld: 197 mg/dL — ABNORMAL HIGH (ref 70–99)
Glucose, Bld: 271 mg/dL — ABNORMAL HIGH (ref 70–99)
Glucose, Bld: 347 mg/dL — ABNORMAL HIGH (ref 70–99)
HCT: 40 % (ref 36.0–46.0)
HCT: 44 % (ref 36.0–46.0)
HEMATOCRIT: 41 % (ref 36.0–46.0)
HEMATOCRIT: 44 % (ref 36.0–46.0)
HEMOGLOBIN: 15 g/dL (ref 12.0–15.0)
HEMOGLOBIN: 15 g/dL (ref 12.0–15.0)
Hemoglobin: 13.9 g/dL (ref 12.0–15.0)
Hemoglobin: 13.6 g/dL (ref 12.0–15.0)
POTASSIUM: 2.4 meq/L — AB (ref 3.7–5.3)
POTASSIUM: 2.4 meq/L — AB (ref 3.7–5.3)
POTASSIUM: 3.1 meq/L — AB (ref 3.7–5.3)
Potassium: 2.8 mEq/L — CL (ref 3.7–5.3)
SODIUM: 140 meq/L (ref 137–147)
Sodium: 141 mEq/L (ref 137–147)
Sodium: 143 mEq/L (ref 137–147)
Sodium: 137 mEq/L (ref 137–147)
TCO2: 22 mmol/L (ref 0–100)
TCO2: 20 mmol/L (ref 0–100)
TCO2: 17 mmol/L (ref 0–100)
TCO2: 14 mmol/L (ref 0–100)

## 2014-08-31 LAB — POCT I-STAT 3, ART BLOOD GAS (G3+)
Acid-base deficit: 1 mmol/L (ref 0.0–2.0)
BICARBONATE: 23.6 meq/L (ref 20.0–24.0)
O2 SAT: 96 %
PCO2 ART: 36.7 mmHg (ref 35.0–45.0)
TCO2: 25 mmol/L (ref 0–100)
pH, Arterial: 7.416 (ref 7.350–7.450)
pO2, Arterial: 83 mmHg (ref 80.0–100.0)

## 2014-08-31 LAB — LACTIC ACID, PLASMA: Lactic Acid, Venous: 17.2 mmol/L — ABNORMAL HIGH (ref 0.5–2.2)

## 2014-08-31 LAB — GLUCOSE, CAPILLARY
GLUCOSE-CAPILLARY: 173 mg/dL — AB (ref 70–99)
Glucose-Capillary: 161 mg/dL — ABNORMAL HIGH (ref 70–99)
Glucose-Capillary: 184 mg/dL — ABNORMAL HIGH (ref 70–99)

## 2014-08-31 LAB — AMMONIA
Ammonia: 312 umol/L — ABNORMAL HIGH (ref 11–60)
Ammonia: 43 umol/L (ref 11–60)

## 2014-08-31 LAB — OCCULT BLOOD X 1 CARD TO LAB, STOOL: FECAL OCCULT BLD: POSITIVE — AB

## 2014-08-31 LAB — D-DIMER, QUANTITATIVE (NOT AT ARMC)

## 2014-08-31 LAB — I-STAT CG4 LACTIC ACID, ED: Lactic Acid, Venous: 15.78 mmol/L — ABNORMAL HIGH (ref 0.5–2.2)

## 2014-08-31 LAB — MRSA PCR SCREENING: MRSA BY PCR: NEGATIVE

## 2014-08-31 LAB — MAGNESIUM
MAGNESIUM: 1.5 mg/dL (ref 1.5–2.5)
MAGNESIUM: 1.9 mg/dL (ref 1.5–2.5)

## 2014-08-31 LAB — APTT
APTT: 32 s (ref 24–37)
APTT: 33 s (ref 24–37)

## 2014-08-31 LAB — TROPONIN I: Troponin I: 1.27 ng/mL (ref <0.30)

## 2014-08-31 LAB — PREGNANCY, URINE: Preg Test, Ur: NEGATIVE

## 2014-08-31 LAB — TSH: TSH: 5.65 u[IU]/mL — ABNORMAL HIGH (ref 0.350–4.500)

## 2014-08-31 LAB — ACETAMINOPHEN LEVEL: Acetaminophen (Tylenol), Serum: <15 ug/mL (ref 10–30)

## 2014-08-31 LAB — SALICYLATE LEVEL

## 2014-08-31 MED ORDER — LORAZEPAM 2 MG/ML IJ SOLN
INTRAMUSCULAR | Status: AC
  Filled 2014-08-31: qty 1

## 2014-08-31 MED ORDER — MAGNESIUM SULFATE 50 % IJ SOLN
2.0000 g | Freq: Once | INTRAVENOUS | Status: DC
  Filled 2014-08-31: qty 4

## 2014-08-31 MED ORDER — PIPERACILLIN-TAZOBACTAM 3.375 G IVPB 30 MIN
3.3750 g | Freq: Once | INTRAVENOUS | Status: AC
  Administered 2014-08-31: 3.375 g via INTRAVENOUS
  Filled 2014-08-31: qty 50

## 2014-08-31 MED ORDER — CISATRACURIUM BOLUS VIA INFUSION
0.1000 mg/kg | Freq: Once | INTRAVENOUS | Status: AC
  Administered 2014-08-31: 8.2 mg via INTRAVENOUS
  Filled 2014-08-31: qty 9

## 2014-08-31 MED ORDER — NALOXONE HCL 0.4 MG/ML IJ SOLN
0.4000 mg | Freq: Once | INTRAMUSCULAR | Status: AC
  Administered 2014-08-31: 0.4 mg via INTRAVENOUS

## 2014-08-31 MED ORDER — POTASSIUM CHLORIDE 20 MEQ/15ML (10%) PO LIQD
40.0000 meq | Freq: Once | ORAL | Status: AC
  Administered 2014-08-31: 40 meq via ORAL
  Filled 2014-08-31: qty 30

## 2014-08-31 MED ORDER — CISATRACURIUM BOLUS VIA INFUSION
0.0500 mg/kg | INTRAVENOUS | Status: DC | PRN
  Filled 2014-08-31: qty 5

## 2014-08-31 MED ORDER — SODIUM CHLORIDE 0.9 % IV SOLN
2000.0000 mL | Freq: Once | INTRAVENOUS | Status: AC
  Administered 2014-08-31: 2000 mL via INTRAVENOUS

## 2014-08-31 MED ORDER — SODIUM BICARBONATE 8.4 % IV SOLN
INTRAVENOUS | Status: AC
  Administered 2014-08-31: 50 meq
  Filled 2014-08-31: qty 50

## 2014-08-31 MED ORDER — MAGNESIUM SULFATE 40 MG/ML IJ SOLN
2.0000 g | Freq: Once | INTRAMUSCULAR | Status: AC
  Administered 2014-08-31: 2 g via INTRAVENOUS
  Filled 2014-08-31: qty 50

## 2014-08-31 MED ORDER — ASPIRIN 300 MG RE SUPP: 300.0000 mg | RECTAL | Status: AC

## 2014-08-31 MED ORDER — FENTANYL CITRATE 0.05 MG/ML IJ SOLN
25.0000 ug/h | INTRAMUSCULAR | Status: DC
  Administered 2014-08-31 – 2014-09-02 (×2): 75 ug/h via INTRAVENOUS
  Administered 2014-09-03: 100 ug/h via INTRAVENOUS
  Filled 2014-08-31 (×3): qty 50

## 2014-08-31 MED ORDER — FLUCONAZOLE IN SODIUM CHLORIDE 400-0.9 MG/200ML-% IV SOLN: 400.0000 mg | INTRAVENOUS | Status: DC

## 2014-08-31 MED ORDER — SODIUM CHLORIDE 0.9 % IV SOLN
1.0000 g | Freq: Once | INTRAVENOUS | Status: AC
  Administered 2014-08-31: 1 g via INTRAVENOUS
  Filled 2014-08-31: qty 10

## 2014-08-31 MED ORDER — SODIUM CHLORIDE 0.9 % IV SOLN
1.0000 mg/h | INTRAVENOUS | Status: DC
  Administered 2014-08-31: 2 mg/h via INTRAVENOUS
  Filled 2014-08-31: qty 10

## 2014-08-31 MED ORDER — FENTANYL BOLUS VIA INFUSION
50.0000 ug | INTRAVENOUS | Status: DC | PRN
  Administered 2014-09-02 – 2014-09-05 (×5): 50 ug via INTRAVENOUS
  Filled 2014-08-31: qty 50

## 2014-08-31 MED ORDER — SODIUM CHLORIDE 0.9 % IV SOLN
1.0000 mg/h | INTRAVENOUS | Status: DC
  Administered 2014-08-31 – 2014-09-01 (×3): 2 mg/h via INTRAVENOUS
  Filled 2014-08-31 (×2): qty 10

## 2014-08-31 MED ORDER — INSULIN ASPART 100 UNIT/ML ~~LOC~~ SOLN
0.0000 [IU] | SUBCUTANEOUS | Status: DC
  Administered 2014-08-31: 5 [IU] via SUBCUTANEOUS
  Administered 2014-09-01: 8 [IU] via SUBCUTANEOUS
  Administered 2014-09-01: 11 [IU] via SUBCUTANEOUS
  Administered 2014-09-01: 15 [IU] via SUBCUTANEOUS

## 2014-08-31 MED ORDER — SODIUM CHLORIDE 0.9 % IV SOLN
25.0000 ug/h | INTRAVENOUS | Status: DC
  Administered 2014-08-31: 75 ug/h via INTRAVENOUS
  Filled 2014-08-31: qty 50

## 2014-08-31 MED ORDER — POTASSIUM CHLORIDE 10 MEQ/100ML IV SOLN
10.0000 meq | INTRAVENOUS | Status: AC
  Administered 2014-08-31: 10 meq via INTRAVENOUS
  Filled 2014-08-31: qty 100

## 2014-08-31 MED ORDER — AMIODARONE HCL IN DEXTROSE 360-4.14 MG/200ML-% IV SOLN
INTRAVENOUS | Status: AC
  Filled 2014-08-31: qty 200

## 2014-08-31 MED ORDER — NALOXONE HCL 1 MG/ML IJ SOLN
INTRAMUSCULAR | Status: AC
  Filled 2014-08-31: qty 2

## 2014-08-31 MED ORDER — HEPARIN SODIUM (PORCINE) 5000 UNIT/ML IJ SOLN
5000.0000 [IU] | Freq: Three times a day (TID) | INTRAMUSCULAR | Status: DC
  Administered 2014-08-31 – 2014-09-14 (×40): 5000 [IU] via SUBCUTANEOUS
  Filled 2014-08-31 (×48): qty 1

## 2014-08-31 MED ORDER — PHENYLEPHRINE HCL 10 MG/ML IJ SOLN
30.0000 ug/min | INTRAVENOUS | Status: DC
  Administered 2014-08-31: 120 ug/min via INTRAVENOUS
  Administered 2014-08-31: 30 ug/min via INTRAVENOUS
  Filled 2014-08-31 (×3): qty 1

## 2014-08-31 MED ORDER — AMIODARONE HCL IN DEXTROSE 360-4.14 MG/200ML-% IV SOLN
60.0000 mg/h | INTRAVENOUS | Status: AC
  Administered 2014-08-31: 60 mg/h via INTRAVENOUS
  Filled 2014-08-31: qty 200

## 2014-08-31 MED ORDER — SODIUM BICARBONATE 4.2 % IV SOLN
100.0000 meq | Freq: Once | INTRAVENOUS | Status: AC
  Filled 2014-08-31: qty 200

## 2014-08-31 MED ORDER — SODIUM CHLORIDE 0.9 % IV SOLN
1.0000 ug/kg/min | INTRAVENOUS | Status: DC
  Filled 2014-08-31: qty 20

## 2014-08-31 MED ORDER — ARTIFICIAL TEARS OP OINT
1.0000 "application " | TOPICAL_OINTMENT | Freq: Three times a day (TID) | OPHTHALMIC | Status: DC
  Administered 2014-08-31 – 2014-09-02 (×6): 1 via OPHTHALMIC
  Filled 2014-08-31 (×3): qty 3.5

## 2014-08-31 MED ORDER — FLUCONAZOLE IN SODIUM CHLORIDE 400-0.9 MG/200ML-% IV SOLN
800.0000 mg | Freq: Once | INTRAVENOUS | Status: DC
  Filled 2014-08-31: qty 400

## 2014-08-31 MED ORDER — NALOXONE HCL 0.4 MG/ML IJ SOLN
INTRAMUSCULAR | Status: AC
  Filled 2014-08-31: qty 1

## 2014-08-31 MED ORDER — PHENYLEPHRINE HCL 10 MG/ML IJ SOLN
30.0000 ug/min | INTRAVENOUS | Status: DC
  Administered 2014-09-01: 200 ug/min via INTRAVENOUS
  Administered 2014-09-01: 150 ug/min via INTRAVENOUS
  Administered 2014-09-01 (×2): 200 ug/min via INTRAVENOUS
  Administered 2014-09-01: 150 ug/min via INTRAVENOUS
  Administered 2014-09-01 – 2014-09-02 (×4): 200 ug/min via INTRAVENOUS
  Filled 2014-08-31 (×9): qty 4

## 2014-08-31 MED ORDER — POTASSIUM CHLORIDE 10 MEQ/50ML IV SOLN
10.0000 meq | INTRAVENOUS | Status: AC
  Administered 2014-08-31 – 2014-09-01 (×3): 10 meq via INTRAVENOUS
  Filled 2014-08-31 (×3): qty 50

## 2014-08-31 MED ORDER — SODIUM CHLORIDE 0.9 % IV SOLN: 2000.0000 mL | Freq: Once | INTRAVENOUS | Status: AC

## 2014-08-31 MED ORDER — MIDAZOLAM HCL 2 MG/2ML IJ SOLN: 2.0000 mg | Freq: Once | INTRAMUSCULAR | Status: AC | PRN

## 2014-08-31 MED ORDER — LORAZEPAM 2 MG/ML IJ SOLN
1.0000 mg | Freq: Once | INTRAMUSCULAR | Status: AC
  Administered 2014-08-31: 1 mg via INTRAVENOUS

## 2014-08-31 MED ORDER — NOREPINEPHRINE BITARTRATE 1 MG/ML IV SOLN
0.0000 ug/min | INTRAVENOUS | Status: DC
  Administered 2014-09-01: 40 ug/min via INTRAVENOUS
  Administered 2014-09-01: 20 ug/min via INTRAVENOUS
  Administered 2014-09-02: 26 ug/min via INTRAVENOUS
  Administered 2014-09-02: 40 ug/min via INTRAVENOUS
  Administered 2014-09-02: 26 ug/min via INTRAVENOUS
  Administered 2014-09-04: 6 ug/min via INTRAVENOUS
  Filled 2014-08-31 (×8): qty 16

## 2014-08-31 MED ORDER — PIPERACILLIN-TAZOBACTAM 3.375 G IVPB
3.3750 g | Freq: Three times a day (TID) | INTRAVENOUS | Status: AC
  Administered 2014-08-31 – 2014-09-07 (×22): 3.375 g via INTRAVENOUS
  Filled 2014-08-31 (×22): qty 50

## 2014-08-31 MED ORDER — CISATRACURIUM BOLUS VIA INFUSION
0.1000 mg/kg | Freq: Once | INTRAVENOUS | Status: DC
  Filled 2014-08-31: qty 9

## 2014-08-31 MED ORDER — SODIUM CHLORIDE 0.9 % IV SOLN
1.0000 ug/kg/min | INTRAVENOUS | Status: DC
Start: 2014-08-31 — End: 2014-09-02
  Administered 2014-09-01: 1 ug/kg/min via INTRAVENOUS
  Filled 2014-08-31 (×2): qty 20

## 2014-08-31 MED ORDER — FAMOTIDINE IN NACL 20-0.9 MG/50ML-% IV SOLN
20.0000 mg | Freq: Two times a day (BID) | INTRAVENOUS | Status: DC
  Administered 2014-09-01 – 2014-09-03 (×7): 20 mg via INTRAVENOUS
  Filled 2014-08-31 (×9): qty 50

## 2014-08-31 MED ORDER — SODIUM PHOSPHATE 3 MMOLE/ML IV SOLN
10.0000 mmol | Freq: Once | INTRAVENOUS | Status: AC
  Administered 2014-08-31: 10 mmol via INTRAVENOUS
  Filled 2014-08-31: qty 3.33

## 2014-08-31 MED ORDER — MIDAZOLAM BOLUS VIA INFUSION
2.0000 mg | INTRAVENOUS | Status: DC | PRN
  Filled 2014-08-31: qty 2

## 2014-08-31 MED ORDER — POTASSIUM CHLORIDE 10 MEQ/50ML IV SOLN
10.0000 meq | INTRAVENOUS | Status: AC
  Administered 2014-08-31 (×6): 10 meq via INTRAVENOUS
  Filled 2014-08-31 (×6): qty 50

## 2014-08-31 MED ORDER — AMIODARONE HCL IN DEXTROSE 360-4.14 MG/200ML-% IV SOLN
30.0000 mg/h | INTRAVENOUS | Status: DC
  Administered 2014-08-31 – 2014-09-01 (×3): 30 mg/h via INTRAVENOUS
  Filled 2014-08-31 (×6): qty 200

## 2014-08-31 MED ORDER — NOREPINEPHRINE BITARTRATE 1 MG/ML IV SOLN
0.5000 ug/min | INTRAVENOUS | Status: DC
  Filled 2014-08-31: qty 4

## 2014-08-31 MED ORDER — NOREPINEPHRINE BITARTRATE 1 MG/ML IV SOLN
0.0000 ug/min | INTRAVENOUS | Status: DC
Start: 2014-08-31 — End: 2014-08-31
  Administered 2014-08-31: 20 ug/min via INTRAVENOUS
  Filled 2014-08-31: qty 4

## 2014-08-31 NOTE — Progress Notes (Signed)
Pt heart rate increasing, oxygen saturation decreasing.  Noe Gens notified and here at bedside to assess pt.  Dr. Lake Bells notified of situation.  Will continue to monitor pt closely.  Orders received.

## 2014-08-31 NOTE — Procedures (Signed)
Central Venous Catheter Insertion Procedure Note JOELLA SAEFONG [id number] 1986-01-03  Procedure: Insertion of Central Venous Catheter Indications: Assessment of intravascular volume, Drug and/or fluid administration and Frequent blood sampling  Procedure Details Consent: Risks of procedure as well as the alternatives and risks of each were explained to the (patient/caregiver).  Consent for procedure obtained.  Time Out: Verified patient identification, verified procedure, site/side was marked, verified correct patient position, special equipment/implants available, medications/allergies/relevent history reviewed, required imaging and test results available.  Performed  Maximum sterile technique was used including antiseptics, cap, gloves, gown, hand hygiene, mask and sheet. Skin prep: Chlorhexidine; local anesthetic administered  A triple lumen catheter was placed in the left internal jugular vein using the Seldinger technique to 20 cm, sutured at hub.  Evaluation Blood flow good Complications: No apparent complications Patient tolerated the procedure well. Chest X-ray ordered to verify placement.  CXR: pending.    Procedure performed under direct supervision of Dr. Lake Bells and with ultrasound guidance for real time vessel cannulation.      Noe Gens, NP-C [facility] Pgr: [phone] or [phone]    08/31/2014, 2:30 PM  Attending:  I supervised the procedure  Roselie Awkward, MD [facility] Pager: [phone] Cell: [phone] If no response, call [phone]

## 2014-08-31 NOTE — Progress Notes (Signed)
Chaplain responded to page to provide support to family who had been placed in consultation room A. Per father and grandmother patient collapsed at home. Patient arrived at ED as post CPR. Patient is intubated and going to unit 2 heart.  Provided empathetic listening, ministry of presence, hospitality emotional and spiritual support to family.  Supported Biochemist, clinical and promoted information sharing between staff and family.Will follow as needed and will pass on to unit chaplain to continue support to patient and family.   08/31/14 1400  Clinical Encounter Type  Visited With Patient;Family;Health care provider  Visit Type Initial;Spiritual support;Code;Critical Care;ED  Referral From Nurse  Spiritual Encounters  Spiritual Needs Emotional  Stress Factors  Family Stress Factors Exhausted;Health changes  Jaclynn Major, [place]

## 2014-08-31 NOTE — Procedures (Signed)
Arterial Line Insertion Procedure Note Julie Saunders [id number] 09/16/1986  Procedure: Insertion of Arterial Line Indications: BP monitoring, ABGs  Procedure Details Consent: Unable to obtain consent because of emergent medical necessity. Time Out: Verified patient identification, verified procedure, site/side was marked, verified correct patient position, special equipment/implants available, medications/allergies/relevent history reviewed, required imaging and test results available.  Performed  Maximum sterile technique was used including antiseptics, cap, gloves, gown, hand hygiene, mask and sheet. Skin prep: Chlorhexidine; local anesthetic administered A single lumen arterial catheter was placed in the R femoral vein using the Seldinger technique.  Ultrasound was used to verify the patency of the vein and for real time needle guidance.  Evaluation Blood flow good Complications: No apparent complications Patient did tolerate procedure well.  MCQUAID, DOUGLAS 08/31/2014, 6:09 PM

## 2014-08-31 NOTE — ED Notes (Signed)
Lactic acid results given to Dr. Rulon Abide and Dr. Jacelyn Grip.

## 2014-08-31 NOTE — Progress Notes (Signed)
[facility] Progress Note Patient Name: Julie Saunders DOB: 22-Aug-1986 MRN: [id number]   Date of Service  08/31/2014  HPI/Events of Note  Low K , still for 3 runs Cooled, cold diuresis affect, prior qtc noted  eICU Interventions  Add 4 runs to the 3, await Mag and repeat k after above        Raylene Miyamoto. 08/31/2014, 8:29 PM

## 2014-08-31 NOTE — Progress Notes (Addendum)
ANTIBIOTIC CONSULT NOTE - INITIAL  Pharmacy Consult for Zosyn, Diflucan Indication: r/o abdominal infection  Allergies  Allergen Reactions  . Sulfa Antibiotics     Patient Measurements: Height: 5\' 8"  (172.7 cm) Weight: 180 lb (81.647 kg) IBW/kg (Calculated) : 63.9 Vital Signs: Temp: 96.6 F (35.9 C) (10/22 1427) Temp Source: Oral (10/22 1427) BP: 107/56 mmHg (10/22 1430) Pulse Rate: 81 (10/22 1430) Intake/Output from previous day:   Intake/Output from this shift:    Labs:  Recent Labs  08/31/14 1219  WBC 19.0*  HGB 12.0  PLT 203  CREATININE 0.86   Estimated Creatinine Clearance: 110.1 ml/min (by C-G formula based on Cr of 0.86). No results found for this basename: VANCOTROUGH, VANCOPEAK, VANCORANDOM, GENTTROUGH, GENTPEAK, GENTRANDOM, TOBRATROUGH, TOBRAPEAK, TOBRARND, AMIKACINPEAK, AMIKACINTROU, AMIKACIN,  in the last 72 hours   Microbiology: No results found for this or any previous visit (from the past 720 hour(s)).  Medical History: Past Medical History  Diagnosis Date  . Crohn disease   . Migraine   . Allergy   . Pancytopenia 11/23/2012    Medications:  Anti-infectives   None     Assessment: 28 year old female admitted 08/31/14 after a collapse at home and subsequent cardiac arrest with prolonged CPR now on hypothermia protocol to start Zosyn and Diflucan per pharmacy dosing consult. Notable PMH includes crohn's disease.   WBC is elevated at 19. Lactic acid is 15.78. Currently being cooled.  SCr is 0.86/estimated CrCl >100 mL/min on admission.   Goal of Therapy:  Clinical resolution of infection  Plan:  1. Zosyn 3.375g IV now over 30 min, then 3.375g IV every 8 hours - each dose over 4 hours. 2. Diflucan 800mg  IV now over 240 min, then 400 mg IV every 24 hours.  3. Follow-up culture results and sensitivities.  4. Follow-up renal function and adjust therapy as needed.   Sloan Leiter, PharmD, BCPS Clinical Pharmacist [phone] 08/31/2014,3:46  PM   Addendum: Due to prolonged QT and need for amiodarone, discussed with CCM NP Noe Gens) and will discontinue fluconazole for now.   Sloan Leiter, PharmD, BCPS Clinical Pharmacist [phone] 08/31/2014, 5:02 PM

## 2014-08-31 NOTE — ED Notes (Signed)
Per EMS- Pt was at home, family heard he collapse in the next room, went to find her and found her unresponsive. Fire department arrived started CPR (was down for about 10-15 mins before CPR was started). EMs arrived noted fine v-fib was defibrillated total of 4 times, given 4 EPIs, 1 D 50, narcan, 150 mg amnio. Total of 17 mins CPR from EMS. Return of pulses.  Initial BP 809 systolic. HR 80 SR upon arrival here, EKG unremarkable. Initiated cool NS (250 cc). Only hx is Crohn's. Pt appears pale, feels cool to touch.

## 2014-08-31 NOTE — ED Provider Notes (Signed)
CSN: [id number]     Arrival date & time 08/31/14  1208 History   First MD Initiated Contact with Patient 08/31/14 1216     Chief Complaint  Patient presents with  . Cardiac Arrest     (Consider location/radiation/quality/duration/timing/severity/associated sxs/prior Treatment) Patient is a 28 y.o. female presenting with general illness. The history is provided by the EMS personnel. No language interpreter was used.  Illness Location:  Home Quality:  Witnessed arrest Severity:  Severe Onset quality:  Sudden Timing:  Constant Progression:  Worsening Chronicity:  New Context:  Per father witnessed arrest. heard patient fall. patient found unresponsive. 10-15 minutes prior to CPR initiated by fire department. down for 10-15 minutes prior to ROSC. defib x4 for vfib. intubated. after ROSC itnermittently spontaneous respirations. given amiodarone en route Relieved by:  Nothing Worsened by:  Nothing Ineffective treatments:  None Risk factors:  Crohn's   Past Medical History  Diagnosis Date  . Crohn disease   . Migraine   . Allergy   . Pancytopenia 11/23/2012   History reviewed. No pertinent past surgical history. No family history on file. History  Substance Use Topics  . Smoking status: Never Smoker   . Smokeless tobacco: Not on file  . Alcohol Use: 0.5 oz/week    1 drink(s) per week   OB History   Grav Para Term Preterm Abortions TAB SAB Ect Mult Living                 Review of Systems  Unable to perform ROS: Acuity of condition      Allergies  Sulfa antibiotics  Home Medications   Prior to Admission medications   Medication Sig Start Date End Date Taking? Authorizing Provider  acetaminophen (TYLENOL) 325 MG tablet Take 650 mg by mouth every 6 (six) hours as needed.    Historical Provider, MD  cetirizine (ZYRTEC) 10 MG tablet Take 10 mg by mouth daily.    Historical Provider, MD  folic acid (FOLVITE) 1 MG tablet Take 1 mg by mouth daily.    Historical  Provider, MD  loperamide (IMODIUM A-D) 2 MG tablet Take 2 mg by mouth 4 (four) times daily as needed.    Historical Provider, MD  mesalamine (PENTASA) 250 MG CR capsule Take 1,000 mg by mouth 4 (four) times daily.    Historical Provider, MD  ondansetron (ZOFRAN) 8 MG tablet Take 8 mg by mouth Three times daily as needed. 09/01/12   Historical Provider, MD  promethazine-codeine (PHENERGAN WITH CODEINE) 6.25-10 MG/5ML syrup Take 5 mLs by mouth every 6 (six) hours as needed. 6.25-10 mg per 5 mls 01/17/13   Historical Provider, MD  SUMAtriptan (IMITREX) 100 MG tablet Take 100 mg by mouth every 2 (two) hours as needed. migraine    Historical Provider, MD   BP 95/56  Pulse 67  Temp(Src) 97.4 F (36.3 C) (Rectal)  Resp 23  Ht 5\' 8"  (1.727 m)  Wt 180 lb (81.647 kg)  BMI 27.38 kg/m2  SpO2 100% Physical Exam  Nursing note and vitals reviewed. Constitutional:  Ill appearing  HENT:  Head: Normocephalic.  ETT deep: pulled back 2 cm  Eyes:  Pupils equal 50mm bilaterally sluggish  Neck: Neck supple.  c-collar placed  Cardiovascular: Normal rate.  Exam reveals no gallop and no friction rub.   No murmur heard. Pulmonary/Chest: No respiratory distress. She has no wheezes. She has rales (bases).  Occasional spontaneous breath, course bilaterally in the bases  Abdominal: Soft. She exhibits no distension. There  is no rebound and no guarding.  Musculoskeletal:  Unable to assess: patient unresponsive  Neurological:  intubated  Skin:  Cool, mottled    ED Course  Procedures (including critical care time) Labs Review Labs Reviewed  CBC WITH DIFFERENTIAL - Abnormal; Notable for the following:    WBC 19.0 (*)    Neutrophils Relative % 42 (*)    Lymphocytes Relative 49 (*)    Neutro Abs 8.0 (*)    Lymphs Abs 9.3 (*)    Monocytes Absolute 1.3 (*)    Basophils Absolute 0.2 (*)    All other components within normal limits  COMPREHENSIVE METABOLIC PANEL - Abnormal; Notable for the following:     Potassium 2.6 (*)    Chloride 93 (*)    CO2 14 (*)    Glucose, Bld 415 (*)    BUN 4 (*)    Calcium 7.6 (*)    Total Protein 4.7 (*)    Albumin 1.5 (*)    AST 66 (*)    Total Bilirubin <0.2 (*)    Anion gap 32 (*)    All other components within normal limits  PROTIME-INR - Abnormal; Notable for the following:    Prothrombin Time 17.3 (*)    All other components within normal limits  LACTIC ACID, PLASMA - Abnormal; Notable for the following:    Lactic Acid, Venous 17.2 (*)    All other components within normal limits  AMMONIA - Abnormal; Notable for the following:    Ammonia 312 (*)    All other components within normal limits  SALICYLATE LEVEL - Abnormal; Notable for the following:    Salicylate Lvl <9.3 (*)    All other components within normal limits  TSH - Abnormal; Notable for the following:    TSH 5.650 (*)    All other components within normal limits  D-DIMER, QUANTITATIVE - Abnormal; Notable for the following:    D-Dimer, Quant >20.00 (*)    All other components within normal limits  I-STAT CG4 LACTIC ACID, ED - Abnormal; Notable for the following:    Lactic Acid, Venous 15.78 (*)    All other components within normal limits  CBG MONITORING, ED - Abnormal; Notable for the following:    Glucose-Capillary 226 (*)    All other components within normal limits  POCT I-STAT, CHEM 8 - Abnormal; Notable for the following:    Potassium 2.4 (*)    BUN <3 (*)    Glucose, Bld 176 (*)    Calcium, Ion 1.08 (*)    All other components within normal limits  MRSA PCR SCREENING  PREGNANCY, URINE  ACETAMINOPHEN LEVEL  BLOOD GAS, ARTERIAL  BASIC METABOLIC PANEL  BASIC METABOLIC PANEL  BASIC METABOLIC PANEL  BASIC METABOLIC PANEL  BASIC METABOLIC PANEL  BASIC METABOLIC PANEL  PROTIME-INR  APTT  BASIC METABOLIC PANEL  I-STAT ARTERIAL BLOOD GAS, ED    Imaging Review Ct Head Wo Contrast  08/31/2014   CLINICAL DATA:  28YOF was at home, family heard he collapse in the next  room, went to find her and found her unresponsive. Fire department arrived started CPR (was down for about 10-15 mins before CPR was started). EMs arrived noted fine v-fib was defibrillated total of 4 times  EXAM: CT HEAD WITHOUT CONTRAST  CT CERVICAL SPINE WITHOUT CONTRAST  TECHNIQUE: Multidetector CT imaging of the head and cervical spine was performed following the standard protocol without intravenous contrast. Multiplanar CT image reconstructions of the cervical spine were also  generated.  COMPARISON:  None.  FINDINGS: CT HEAD FINDINGS  There is no evidence of mass effect, midline shift or extra-axial fluid collections. There is no evidence of a space-occupying lesion or intracranial hemorrhage. There is no evidence of a cortical-based area of acute infarction.  The ventricles and sulci are appropriate for the patient's age. Incidental note is made of cavum septum pellucidum. The basal cisterns are patent.  Visualized portions of the orbits are unremarkable. The visualized portions of the paranasal sinuses and mastoid air cells are unremarkable.  The osseous structures are unremarkable.  CT CERVICAL SPINE FINDINGS  The alignment is anatomic. The vertebral body heights are maintained. There is no acute fracture. There is no static listhesis. The prevertebral soft tissues are normal. The intraspinal soft tissues are not fully imaged on this examination due to poor soft tissue contrast, but there is no gross soft tissue abnormality.  The disc spaces are maintained.  There are bilateral apical interstitial and alveolar airspace opacities most consistent with pulmonary edema given the patient's history.  IMPRESSION: 1. No acute intracranial pathology. 2. No acute osseous injury of the cervical spine. 3. Biapical interstitial and alveolar airspace opacities most concerning for pulmonary edema given the patient's history.   Electronically Signed   By: Kathreen Devoid   On: 08/31/2014 13:31   Ct Cervical Spine Wo  Contrast  08/31/2014   CLINICAL DATA:  28YOF was at home, family heard he collapse in the next room, went to find her and found her unresponsive. Fire department arrived started CPR (was down for about 10-15 mins before CPR was started). EMs arrived noted fine v-fib was defibrillated total of 4 times  EXAM: CT HEAD WITHOUT CONTRAST  CT CERVICAL SPINE WITHOUT CONTRAST  TECHNIQUE: Multidetector CT imaging of the head and cervical spine was performed following the standard protocol without intravenous contrast. Multiplanar CT image reconstructions of the cervical spine were also generated.  COMPARISON:  None.  FINDINGS: CT HEAD FINDINGS  There is no evidence of mass effect, midline shift or extra-axial fluid collections. There is no evidence of a space-occupying lesion or intracranial hemorrhage. There is no evidence of a cortical-based area of acute infarction.  The ventricles and sulci are appropriate for the patient's age. Incidental note is made of cavum septum pellucidum. The basal cisterns are patent.  Visualized portions of the orbits are unremarkable. The visualized portions of the paranasal sinuses and mastoid air cells are unremarkable.  The osseous structures are unremarkable.  CT CERVICAL SPINE FINDINGS  The alignment is anatomic. The vertebral body heights are maintained. There is no acute fracture. There is no static listhesis. The prevertebral soft tissues are normal. The intraspinal soft tissues are not fully imaged on this examination due to poor soft tissue contrast, but there is no gross soft tissue abnormality.  The disc spaces are maintained.  There are bilateral apical interstitial and alveolar airspace opacities most consistent with pulmonary edema given the patient's history.  IMPRESSION: 1. No acute intracranial pathology. 2. No acute osseous injury of the cervical spine. 3. Biapical interstitial and alveolar airspace opacities most concerning for pulmonary edema given the patient's history.    Electronically Signed   By: Kathreen Devoid   On: 08/31/2014 13:31   Dg Chest Port 1 View  08/31/2014   CLINICAL DATA:  Central line placement, endotracheal tube placement  EXAM: PORTABLE CHEST - 1 VIEW  COMPARISON:  08/31/2014  FINDINGS: Endotracheal tube with the tip 1.7 above the carina. Left jugular  central venous catheter with the tip projecting over the cavoatrial junction. Nasogastric 2 projecting over the stomach.  Bilateral patchy upper lobe interstitial and alveolar airspace opacities. No pleural effusion or pneumothorax. Stable cardiomediastinal silhouette. Unremarkable osseous structures.  IMPRESSION: 1. Endotracheal tube with the tip 1.7 cm above the carina. 2. Left jugular central venous catheter with the tip projecting over the cavoatrial junction. No pneumothorax. 3. Bilateral upper lobe interstitial and alveolar airspace opacities which may be secondary to pulmonary edema versus pneumonia.   Electronically Signed   By: Kathreen Devoid   On: 08/31/2014 14:40   Dg Chest Portable 1 View  08/31/2014   CLINICAL DATA:  Cardiac arrest.  Endotracheal tube placement.  EXAM: PORTABLE CHEST - 1 VIEW  COMPARISON:  None.  FINDINGS: Endotracheal tube is in place with tip 1.9 cm above the carina. NG tube courses into the stomach and below the inferior margin of the film. Bilateral perihilar and upper lobe airspace opacities are identified. Heart size is normal.  IMPRESSION: ET tube and NG tube project in good position.  Bilateral mid and upper lobe airspace disease could be due to pneumonia or pulmonary edema.   Electronically Signed   By: Inge Rise M.D.   On: 08/31/2014 13:02     EKG Interpretation None      MDM   Final diagnoses:  None    1:02 PM Pt is a 28 y.o. female with pertinent PMHX of Crohn's disease, pancytopenia who presents to the ED with cardiac arrest. Patient was in separate room when father heard patient fall. Patient found unresponsive. Patient down approximately 10-15  minutes until fire department was on scene to initiate CPR. ACLS: by ESM for 15 minutes priro to ROSC. Patient noted to be in fin vfib defibrillated and then course vfib and defibrillated. Patient defrillated a total of 4 times with EMS. Intubated on scene. Given a bolus of amiodarone 150 mg. Narcan and D50 given without change in mental status.   On arrival: patient intermittently breathing spontaneously with 2 mm pupils bilaterally sluggish. Ill appearing. Concern for possible acute hemorrhage versus possible respiratory arrest plan for Ct head wo contrast to rule out possible SAH/SDH. Will place c-collar and also scan cervical spine to rule out acute fracture or dislocation given unwitnessed fall from likely standing. Plan for co-ingestant work up, UDS. CBc, CMP, lactic acid: ABG, ammonia and lactic acid  EKG 1 personally reviewed by myself showed NSR, prolonged QT Rate of 84, PR 167ms, QRS 140ms QT/QTC 488/597ms, normal axis, without evidence of new ischemia. No Comparison showed, indication: cardiac arrest  EKG 2 personally reviewed by myself showed afib with bigeminy, flatening of  Rate of 84, PR NAms, QRS 145ms QT/QTC483/441 ms, normal axis, without evidence of new ischemia. Comparison showed NSR, indication: cardiac arrest  CXR AP portable per my read bilateral pulmonary edema concerning for ARDs, ET tube and OG tube in satisfactory position  Review of labs: Lactic acid: 15.78 UPT: negative INR:1.4 CBC: leukocytosis, H&H 12.0/37.4 CMP: severe hypokalemia: hypochloremia Ammonia: 312 Tylenol: neg TSH: 6.063 Salicylate: <0.1  Ct head wo contrast negative for intracranial abnormalities  Severe hypokalemia: plan for 40 of potassium through Og tube and 20 IV. Will also give 2G of mag for concern for hypomag  Collateral per family: multiple episodes of diarrhea likely cause of hypokalemia and subsequent arrest. Plan for post arrest hypothermia protocol and consult critical care for  admission  Plan for admission to ICU. Critical care at bedside for central  line placement.  Labs, EKG and imaging reviewed by myself and considered in medical decision making if ordered.  Imaging interpreted by radiology. Pt was discussed with my attending, Dr. Michelle Nasuti, MD 08/31/14 [place], MD 08/31/14 [phone]

## 2014-08-31 NOTE — Progress Notes (Signed)
  Echocardiogram 2D Echocardiogram has been performed.  Julie Saunders 08/31/2014, 4:17 PM

## 2014-08-31 NOTE — Progress Notes (Signed)
[place] Progress Note Patient Name: Julie Saunders DOB: 01-23-1986 MRN: [id number]   Date of Service  08/31/2014  HPI/Events of Note  k low ,  Phos low, mag low  eICU Interventions  k supp is on going Have ordered na phos, mag     Intervention Category Intermediate Interventions: Electrolyte abnormality - evaluation and management  Raylene Miyamoto. 08/31/2014, 10:11 PM

## 2014-08-31 NOTE — ED Notes (Signed)
Patient transported to CT 

## 2014-08-31 NOTE — ED Notes (Addendum)
Inward posturing noted to bilateral hands and feet. Critical care at bedside. Question seizure? Spoke with EEG, who is calling Neurology to determine if pt temp is too cool.

## 2014-08-31 NOTE — Progress Notes (Signed)
Due to hypothermia protocol and patients current temp of 96.6 EEG per Dr. Armida Sans in Neurology to be completed when patient is rewarmed to assess for seizure and brain function. ED nurse Sarah notified.

## 2014-08-31 NOTE — Progress Notes (Addendum)
  Amiodarone Drug - Drug Interaction Consult Note  Recommendations: Patient is on both amiodarone and fluconazole which can result in increased amiodarone concentrations and potential for QT prolongation, torsades de pointes, cardiac arrest. Attempt to narrow of fluconazole as soon as possible. Consider change to alternative agent (Micafungin) to avoid this interaction. Patient has prolonged QT on EKG in ED. Also, monitor QT while on therapy.   ** Called and discussed with Noe Gens, NP - Decision to hold Fluconazole at this time.   Amiodarone is metabolized by the cytochrome P450 system and therefore has the potential to cause many drug interactions. Amiodarone has an average plasma half-life of 50 days (range 20 to 100 days).   There is potential for drug interactions to occur several weeks or months after stopping treatment and the onset of drug interactions may be slow after initiating amiodarone.   [x]  Drugs that prolong the QT interval:  Torsades de pointes risk may be increased with concurrent use - avoid if possible.  Monitor QTc, also keep magnesium/potassium WNL if concurrent therapy can't be avoided. Marland Kitchen Antibiotics: e.g. fluoroquinolones, erythromycin. . Antiarrhythmics: e.g. quinidine, procainamide, disopyramide, sotalol. . Antipsychotics: e.g. phenothiazines, haloperidol.  . Lithium, tricyclic antidepressants, and methadone. Thank You,  Brain Hilts  08/31/2014 4:43 PM

## 2014-08-31 NOTE — H&P (Signed)
PULMONARY / CRITICAL CARE MEDICINE   Name: Julie Saunders MRN: [id number] DOB: [birth year]    ADMISSION DATE:  08/31/2014  REFERRING MD :  Dr. Jacelyn Grip   CHIEF COMPLAINT:  Cardiac Arrest    INITIAL PRESENTATION:  28 y/o female admitted 10/22 after collapse with subsequent cardiac arrest at home and prolonged CPR (prolonged down time) when presenting to the ER.  To ICU for hypothermia protocol.  STUDIES:   10/16  Colonoscopy (Per [facility]) >> displays active duodenitis with areas of villous blunting/atrophy in addition to a small granuloma with multinucleated giant cells as seen in one of the fragments. 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  This event was heard by family in next room.  The family was not physically able to assist her as they were elderly. She was unresponsive for approx. 10-15 minutes before fire department arrived and initiated CPR for 15 minutes until EMS arrived and initiated ACLS with CPR for 15 more minutes prior to ROSC.  Pt was defibrillated 4x for vfib, and received 4 rounds of epinephrine, 150 mg amiodarone, narcan, D50, and was intubated, without any increase in MS/responsiveness.  ER evaluation noted altered mental status, tachycardia with episodes of PSVT, posturing and slugish 25m pupils (GCS 5).  Head and C-spine CT were neg for acute process, CXR showed bilateral upper airspace disease worrisome for edema vs infiltrate.  Labs showed a leukocytosis WBC 19.0 with increased lymphocytes,  hypokalemia (2.6), elevated ammonia 312, lactic acid 17.2, and an anion gap of 32.  PCCM called for ICU admission.   At baseline, family reports she does not use drugs or smoke.  Currently enrolled in school but had to take a break due to health issues.  She underwent a colonoscopy on 10/16 with a negative biopsy.  Findings consistent with duodenitis.  Family reports ongoing diarrhea for 3-4 days at home.  Pt had one beer on Sunday prior to admission but not a regular ETOH consumer.    PAST MEDICAL HISTORY :   has a past medical history of Crohn disease; Migraine; Allergy; and Pancytopenia (11/23/2012).  has no past surgical history on file.  HOME MEDICATIONS: Prior to Admission medications   Medication Sig Start Date End Date Taking? Authorizing Provider  acetaminophen (TYLENOL) 325 MG tablet Take 650 mg by mouth every 6 (six) hours as needed.    Historical Provider, MD  cetirizine (ZYRTEC) 10 MG tablet Take 10 mg by mouth daily.    Historical Provider, MD  folic acid (FOLVITE) 1 MG tablet Take 1 mg by mouth daily.    Historical Provider, MD  loperamide (IMODIUM A-D) 2 MG tablet Take 2 mg by mouth 4 (four) times daily as needed.    Historical Provider, MD  mesalamine (PENTASA) 250 MG CR capsule Take 1,000 mg by mouth 4 (four) times daily.    Historical Provider, MD  ondansetron (ZOFRAN) 8 MG tablet Take 8 mg by mouth Three times daily as needed. 09/01/12   Historical Provider, MD  promethazine-codeine (PHENERGAN WITH  CODEINE) 6.25-10 MG/5ML syrup Take 5 mLs by mouth every 6 (six) hours as needed. 6.25-10 mg per 5 mls 01/17/13   Historical Provider, MD  SUMAtriptan (IMITREX) 100 MG tablet Take 100 mg by mouth every 2 (two) hours as needed. migraine    Historical Provider, MD   Allergies  Allergen Reactions  . Sulfa Antibiotics     FAMILY HISTORY:  has no family status information on file.   SOCIAL HISTORY:  reports that she has never smoked. She does not have any smokeless tobacco history  on file. She reports that she drinks about .5 ounces of alcohol per week. She reports that she does not use illicit drugs.  REVIEW OF SYSTEMS:  Unable to assess  SUBJECTIVE:   VITAL SIGNS: Temp:  [96.6 F (35.9 C)-97.4 F (36.3 C)] 96.6 F (35.9 C) (10/22 1427) Pulse Rate:  [36-128] 128 (10/22 1630) Resp:  [12-40] 12 (10/22 1630) BP: (90-146)/(43-95) 146/91 mmHg (10/22 1630) SpO2:  [90 %-100 %] 90 % (10/22 1630) FiO2 (%):  [40 %-100 %] 40 % (10/22 1601) Weight:  [180 lb (81.647 kg)] 180 lb (81.647 kg) (10/22 1228)  HEMODYNAMICS:    VENTILATOR SETTINGS: Vent Mode:  [-] PRVC FiO2 (%):  [40 %-100 %] 40 % Set Rate:  [12 bmp] 12 bmp Vt Set:  [450 mL] 450 mL PEEP:  [5 cmH20] 5 cmH20 Plateau Pressure:  [18 cmH20] 18 cmH20  INTAKE / OUTPUT: No intake or output data in the 24 hours ending 08/31/14 1730  PHYSICAL EXAMINATION: General:  Obese white female, toxic appearing and pale, intubated Neuro:  GCS 5, flexed to painful stimuli, pupils 40m & equal, brisk reactivity to light, general decorticate posturing, some thrusting of tongue / chin  HEENT:  Eyes- fixed upward gaze, pt intubated in C-collar, neck and face with petechia,  Cardiovascular: s1s2 tachy, monitor with bigeminy  Lungs:  Thorax bruised from "thumper", resp's non-labored, Coarse crackles bilaterally A&L Abdomen:  Soft, pale, non-distended +BS Musculoskeletal:  LE warm and pale with pitting edema 1+, bilateral feet in plantar flexion, left arm flexed at side, right arm extended with fist, BL UE weak radial pulses   LABS:  CBC  Recent Labs Lab 08/31/14 1219 08/31/14 1546  WBC 19.0*  --   HGB 12.0 13.9  HCT 37.4 41.0  PLT 203  --    Coag's  Recent Labs Lab 08/31/14 1219  INR 1.40   BMET  Recent Labs Lab 08/31/14 1219 08/31/14 1546 08/31/14 1559  NA 139 141 139  K 2.6* 2.4* 2.7*  CL 93* 100 103  CO2 14*  --  22  BUN 4* <3* 4*  CREATININE 0.86 1.00 0.98  GLUCOSE 415* 176* 172*    Electrolytes  Recent Labs Lab 08/31/14 1219 08/31/14 1559  CALCIUM 7.6* 6.8*   Sepsis Markers  Recent Labs Lab 08/31/14 1219 08/31/14 1233  LATICACIDVEN 17.2* 15.78*   ABG No results found for this basename: PHART, PCO2ART, PO2ART,  in the last 168 hours Liver Enzymes  Recent Labs Lab 08/31/14 1219  AST 66*  ALT 24  ALKPHOS 90  BILITOT <0.2*  ALBUMIN 1.5*   Cardiac Enzymes No results found for this basename: TROPONINI, PROBNP,  in the last 168 hours Glucose  Recent Labs Lab 08/31/14 1327  GLUCAP 226*    Imaging No results found.   ASSESSMENT / PLAN:  PULMONARY  OETT 10/22 >>  A:   Acute respiratory failure - in the setting of cardiac arrest, unclear etiology.  Pulmonary edema - bilateral upper lobe opacities, likely edema but consider infiltrate with aspiration R/O Aspiration R/O PE - hx Chron's P:   Full vent support 8cc/kg Trend ABG per hypothermia protocol  CXR in am - monitor opacities - edema vs infiltrate vs aspiration Assess D-Dimer, LE venous duplex  CARDIOVASCULAR CVL A:  Cardiac Arrest - unclear etiology, volume depletion, electrolyte disturbances  Shock  Tachycardia - with burst of PSVT Bigeminy Prolonged QTc P:  Hypothermia protocol Assess ECHO  Hold Cardiology consult for now Bilateral LE doppler to r/o DVT Neo to support MAP >85 NS per protocol  Amiodarone gtt  Repeat EKG in am   RENAL A:    Hypokalemia Hypochloremia   Lactic Acidosis - in setting of cardiac arrest with prolonged downtime.   Likely Non-Gap Acidosis - with hx of 3-4 days diarrhea PTA.    P:   6 runs IV K 10 mEq now Trend BMP, replace lytes as indicated  Ensure adequate perfusion, NS + MAP > 85 2 amps bicarbonate now, may need bicarb gtt  GASTROINTESTINAL A:  Crohns - colonoscopy done 10/16 with multiple bx sites, neg for malignancy.  C/W Active Gastritis.   LLQ ABD Pain  Diarrhea  Elevated Ammonia   Protein Calorie Malnutrition  P:   GI  prophylaxis with IV pepcid Stool culture, FOB Trend LFT's  CT abd / pelvis once stabilized  STAT 2 view KUB now to assess for free air Monitor stool for melena / hematochezia  Consider TF in am 10/23  Repeat ammonia now, consider cautious usage of lactulose  Hold home medications: pentasa, imodium  HEMATOLOGIC A:   No acute process  P:  Monitor CBC  DVT: SCD's + heparin sq  INFECTIOUS A:   Diarrhea - Crohns vs enterocolitis Possible Peritonitis -post colonoscopy 10/16 P:   BCx2 10/22 >> Stool Culture 10/22 >>  Abx: Zosyn, start date10/22, day 1/x Hold anti-fungal coverage for now with amiodarone and initial EKG with prolonged Qtc  ENDOCRINE A:   Hyperglycemia - likely stress response, no hx of DM P:   Consider SSI if CBG remains > 180  NEUROLOGIC A:   Acute encephalopathy - post arrest + hepatic encephalopathy (ammonia 312) P:   RASS goal: -5 with hypothermia protocol  Possible seizure activity and decorticate posturing Continuous EEG monitoring Neuro consult after hypothermia   GLOBAL:    - Updates: Family updated at bedside 10/22.       TODAY'S SUMMARY: 28 y/o F admitted with cardiac arrest of unclear etiology.  Prolonged down time.  Admitted for hypothermia protocol.  Significant electrolyte abnormalities. Await KUB to r/o free air, ABG, ammonia and troponin.    Noe Gens, NP-C [facility] Pgr: [phone] or [phone]  Attending:  I have seen and examined the patient with nurse practitioner/resident and agree with the note above.   Very sick lady, pupils reactive, GCS 5 on my exam Presumably this is hypokalemia causing Vfib, but will check stat LE doppler given high d-dimer Hypothermia protocol Use neo, not levophed given ectopy Amiodarone, may need cardiology to see [facility] notified of admission  Father updated at bedside.  I have personally obtained a history, examined the patient, evaluated laboratory and imaging  results, formulated the assessment and plan and placed orders. CRITICAL CARE: The patient is critically ill with multiple organ systems failure and requires high complexity decision making for assessment and support, frequent evaluation and titration of therapies, application of advanced monitoring technologies and  extensive interpretation of multiple databases. Critical Care Time devoted to patient care services described in this note is 90_ minutes.   Roselie Awkward, MD [place] [facility] Pager: [phone] Cell: [phone] If no response, call [phone]   08/31/2014, 5:30 PM

## 2014-08-31 NOTE — ED Notes (Signed)
Critical care at bedside. Placing central line.

## 2014-08-31 NOTE — ED Notes (Signed)
Pt. Transported to/from CT-uneventful, RT to monitor.

## 2014-08-31 NOTE — Progress Notes (Signed)
Utilization Review Completed.Julie Saunders Un T10/22/2015  

## 2014-09-01 ENCOUNTER — Inpatient Hospital Stay (HOSPITAL_COMMUNITY): Payer: 59

## 2014-09-01 ENCOUNTER — Encounter (HOSPITAL_COMMUNITY): Payer: Self-pay | Admitting: *Deleted

## 2014-09-01 DIAGNOSIS — R600 Localized edema: Secondary | ICD-10-CM

## 2014-09-01 LAB — GLUCOSE, CAPILLARY
GLUCOSE-CAPILLARY: 316 mg/dL — AB (ref 70–99)
GLUCOSE-CAPILLARY: 288 mg/dL — AB (ref 70–99)
GLUCOSE-CAPILLARY: 242 mg/dL — AB (ref 70–99)
GLUCOSE-CAPILLARY: 224 mg/dL — AB (ref 70–99)
GLUCOSE-CAPILLARY: 156 mg/dL — AB (ref 70–99)
GLUCOSE-CAPILLARY: 138 mg/dL — AB (ref 70–99)
GLUCOSE-CAPILLARY: 109 mg/dL — AB (ref 70–99)
Glucose-Capillary: 121 mg/dL — ABNORMAL HIGH (ref 70–99)
Glucose-Capillary: 127 mg/dL — ABNORMAL HIGH (ref 70–99)
Glucose-Capillary: 132 mg/dL — ABNORMAL HIGH (ref 70–99)
Glucose-Capillary: 168 mg/dL — ABNORMAL HIGH (ref 70–99)
Glucose-Capillary: 194 mg/dL — ABNORMAL HIGH (ref 70–99)
Glucose-Capillary: 208 mg/dL — ABNORMAL HIGH (ref 70–99)
Glucose-Capillary: 250 mg/dL — ABNORMAL HIGH (ref 70–99)
Glucose-Capillary: 281 mg/dL — ABNORMAL HIGH (ref 70–99)
Glucose-Capillary: 331 mg/dL — ABNORMAL HIGH (ref 70–99)
Glucose-Capillary: 350 mg/dL — ABNORMAL HIGH (ref 70–99)
Glucose-Capillary: 357 mg/dL — ABNORMAL HIGH (ref 70–99)
Glucose-Capillary: 364 mg/dL — ABNORMAL HIGH (ref 70–99)

## 2014-09-01 LAB — BASIC METABOLIC PANEL
ANION GAP: 20 — AB (ref 5–15)
ANION GAP: 20 — AB (ref 5–15)
ANION GAP: 18 — AB (ref 5–15)
ANION GAP: 17 — AB (ref 5–15)
ANION GAP: 16 — AB (ref 5–15)
BUN: 6 mg/dL (ref 6–23)
BUN: 7 mg/dL (ref 6–23)
BUN: 7 mg/dL (ref 6–23)
BUN: 7 mg/dL (ref 6–23)
BUN: 7 mg/dL (ref 6–23)
CALCIUM: 6.9 mg/dL — AB (ref 8.4–10.5)
CHLORIDE: 100 meq/L (ref 96–112)
CHLORIDE: 102 meq/L (ref 96–112)
CHLORIDE: 101 meq/L (ref 96–112)
CO2: 13 meq/L — AB (ref 19–32)
CO2: 11 meq/L — AB (ref 19–32)
CO2: 11 meq/L — AB (ref 19–32)
CO2: 11 meq/L — AB (ref 19–32)
CO2: 12 meq/L — AB (ref 19–32)
Calcium: 6.6 mg/dL — ABNORMAL LOW (ref 8.4–10.5)
Calcium: 6.7 mg/dL — ABNORMAL LOW (ref 8.4–10.5)
Calcium: 7.1 mg/dL — ABNORMAL LOW (ref 8.4–10.5)
Calcium: 6.9 mg/dL — ABNORMAL LOW (ref 8.4–10.5)
Chloride: 100 mEq/L (ref 96–112)
Chloride: 100 mEq/L (ref 96–112)
Creatinine, Ser: 1.07 mg/dL (ref 0.50–1.10)
Creatinine, Ser: 1.15 mg/dL — ABNORMAL HIGH (ref 0.50–1.10)
Creatinine, Ser: 1.16 mg/dL — ABNORMAL HIGH (ref 0.50–1.10)
Creatinine, Ser: 1.26 mg/dL — ABNORMAL HIGH (ref 0.50–1.10)
Creatinine, Ser: 1.28 mg/dL — ABNORMAL HIGH (ref 0.50–1.10)
GFR calc Af Amer: 82 mL/min — ABNORMAL LOW (ref >90)
GFR calc Af Amer: 75 mL/min — ABNORMAL LOW (ref >90)
GFR calc Af Amer: 74 mL/min — ABNORMAL LOW (ref >90)
GFR calc Af Amer: 67 mL/min — ABNORMAL LOW (ref >90)
GFR calc Af Amer: 66 mL/min — ABNORMAL LOW (ref >90)
GFR calc non Af Amer: 70 mL/min — ABNORMAL LOW (ref >90)
GFR calc non Af Amer: 65 mL/min — ABNORMAL LOW (ref >90)
GFR calc non Af Amer: 64 mL/min — ABNORMAL LOW (ref >90)
GFR calc non Af Amer: 58 mL/min — ABNORMAL LOW (ref >90)
GFR calc non Af Amer: 57 mL/min — ABNORMAL LOW (ref >90)
Glucose, Bld: 331 mg/dL — ABNORMAL HIGH (ref 70–99)
Glucose, Bld: 257 mg/dL — ABNORMAL HIGH (ref 70–99)
Glucose, Bld: 202 mg/dL — ABNORMAL HIGH (ref 70–99)
Glucose, Bld: 135 mg/dL — ABNORMAL HIGH (ref 70–99)
Glucose, Bld: 107 mg/dL — ABNORMAL HIGH (ref 70–99)
Potassium: 3.2 mEq/L — ABNORMAL LOW (ref 3.7–5.3)
Potassium: 3.3 mEq/L — ABNORMAL LOW (ref 3.7–5.3)
Potassium: 4.7 mEq/L (ref 3.7–5.3)
Potassium: 3.5 mEq/L — ABNORMAL LOW (ref 3.7–5.3)
Potassium: 3.5 mEq/L — ABNORMAL LOW (ref 3.7–5.3)
SODIUM: 133 meq/L — AB (ref 137–147)
SODIUM: 131 meq/L — AB (ref 137–147)
SODIUM: 129 meq/L — AB (ref 137–147)
SODIUM: 130 meq/L — AB (ref 137–147)
Sodium: 129 mEq/L — ABNORMAL LOW (ref 137–147)

## 2014-09-01 LAB — HEPATIC FUNCTION PANEL
ALT: 48 U/L — ABNORMAL HIGH (ref 0–35)
AST: 62 U/L — AB (ref 0–37)
Albumin: 1.5 g/dL — ABNORMAL LOW (ref 3.5–5.2)
Alkaline Phosphatase: 97 U/L (ref 39–117)
BILIRUBIN TOTAL: 0.3 mg/dL (ref 0.3–1.2)
Bilirubin, Direct: <0.2 mg/dL (ref 0.0–0.3)
Total Protein: 5.2 g/dL — ABNORMAL LOW (ref 6.0–8.3)

## 2014-09-01 LAB — POCT I-STAT, CHEM 8
BUN: <3 mg/dL — ABNORMAL LOW (ref 6–23)
CHLORIDE: 103 meq/L (ref 96–112)
Calcium, Ion: 1.03 mmol/L — ABNORMAL LOW (ref 1.12–1.23)
Creatinine, Ser: 0.9 mg/dL (ref 0.50–1.10)
Glucose, Bld: 351 mg/dL — ABNORMAL HIGH (ref 70–99)
HEMATOCRIT: 52 % — AB (ref 36.0–46.0)
Hemoglobin: 17.7 g/dL — ABNORMAL HIGH (ref 12.0–15.0)
Potassium: 3 mEq/L — ABNORMAL LOW (ref 3.7–5.3)
Sodium: 135 mEq/L — ABNORMAL LOW (ref 137–147)
TCO2: 13 mmol/L (ref 0–100)

## 2014-09-01 LAB — POCT I-STAT 3, ART BLOOD GAS (G3+)
Acid-base deficit: 13 mmol/L — ABNORMAL HIGH (ref 0.0–2.0)
Bicarbonate: 13.5 mEq/L — ABNORMAL LOW (ref 20.0–24.0)
O2 Saturation: 100 %
PCO2 ART: 28.1 mmHg — AB (ref 35.0–45.0)
PH ART: 7.269 — AB (ref 7.350–7.450)
Patient temperature: 33.6
TCO2: 14 mmol/L (ref 0–100)
pO2, Arterial: 213 mmHg — ABNORMAL HIGH (ref 80.0–100.0)

## 2014-09-01 LAB — CLOSTRIDIUM DIFFICILE BY PCR: Toxigenic C. Difficile by PCR: NEGATIVE

## 2014-09-01 LAB — TROPONIN I
TROPONIN I: 0.97 ng/mL — AB (ref <0.30)
Troponin I: 1.02 ng/mL (ref <0.30)

## 2014-09-01 LAB — MAGNESIUM: MAGNESIUM: 4 mg/dL — AB (ref 1.5–2.5)

## 2014-09-01 LAB — LACTIC ACID, PLASMA: Lactic Acid, Venous: 4.6 mmol/L — ABNORMAL HIGH (ref 0.5–2.2)

## 2014-09-01 MED ORDER — SODIUM CHLORIDE 0.9 % IV SOLN: INTRAVENOUS | Status: DC | PRN

## 2014-09-01 MED ORDER — POTASSIUM CHLORIDE 10 MEQ/50ML IV SOLN: 10.0000 meq | INTRAVENOUS | Status: DC

## 2014-09-01 MED ORDER — SODIUM CHLORIDE 0.9 % IV SOLN
INTRAVENOUS | Status: DC | PRN
  Administered 2014-09-10 – 2014-09-12 (×4): via INTRAVENOUS

## 2014-09-01 MED ORDER — CETYLPYRIDINIUM CHLORIDE 0.05 % MT LIQD
7.0000 mL | Freq: Four times a day (QID) | OROMUCOSAL | Status: DC
  Administered 2014-09-01 – 2014-09-05 (×19): 7 mL via OROMUCOSAL

## 2014-09-01 MED ORDER — SODIUM CHLORIDE 0.9 % IJ SOLN
10.0000 mL | Freq: Two times a day (BID) | INTRAMUSCULAR | Status: DC
  Administered 2014-09-02 – 2014-09-07 (×9): 10 mL

## 2014-09-01 MED ORDER — SODIUM CHLORIDE 0.9 % IJ SOLN
10.0000 mL | INTRAMUSCULAR | Status: DC | PRN
  Administered 2014-09-09 – 2014-09-13 (×5): 10 mL
  Filled 2014-09-01 (×4): qty 40

## 2014-09-01 MED ORDER — MAGNESIUM SULFATE 40 MG/ML IJ SOLN
2.0000 g | Freq: Once | INTRAMUSCULAR | Status: AC
  Administered 2014-09-01: 2 g via INTRAVENOUS
  Filled 2014-09-01: qty 50

## 2014-09-01 MED ORDER — CHLORHEXIDINE GLUCONATE 0.12 % MT SOLN
15.0000 mL | Freq: Two times a day (BID) | OROMUCOSAL | Status: DC
  Administered 2014-09-01 – 2014-09-05 (×11): 15 mL via OROMUCOSAL
  Filled 2014-09-01 (×11): qty 15

## 2014-09-01 MED ORDER — POTASSIUM CHLORIDE 10 MEQ/50ML IV SOLN
INTRAVENOUS | Status: AC
  Filled 2014-09-01: qty 50

## 2014-09-01 MED ORDER — INSULIN GLARGINE 100 UNIT/ML ~~LOC~~ SOLN
10.0000 [IU] | SUBCUTANEOUS | Status: DC
  Administered 2014-09-01 – 2014-09-02 (×2): 10 [IU] via SUBCUTANEOUS
  Filled 2014-09-01 (×3): qty 0.1

## 2014-09-01 MED ORDER — SODIUM CHLORIDE 0.9 % IV BOLUS (SEPSIS)
500.0000 mL | Freq: Once | INTRAVENOUS | Status: AC
  Administered 2014-09-01: 500 mL via INTRAVENOUS

## 2014-09-01 MED ORDER — DEXTROSE 10 % IV SOLN
INTRAVENOUS | Status: DC | PRN
Start: 2014-09-01 — End: 2014-09-03

## 2014-09-01 MED ORDER — SODIUM CHLORIDE 0.9 % IV SOLN
INTRAVENOUS | Status: DC
  Administered 2014-09-01: 1.5 [IU]/h via INTRAVENOUS
  Filled 2014-09-01: qty 2.5

## 2014-09-01 MED ORDER — SODIUM CHLORIDE 0.9 % IV SOLN
1.0000 g | Freq: Once | INTRAVENOUS | Status: AC
  Administered 2014-09-01: 1 g via INTRAVENOUS
  Filled 2014-09-01: qty 10

## 2014-09-01 MED ORDER — POTASSIUM CHLORIDE 20 MEQ/15ML (10%) PO LIQD
40.0000 meq | Freq: Once | ORAL | Status: AC
  Administered 2014-09-01: 40 meq
  Filled 2014-09-01 (×2): qty 30

## 2014-09-01 MED ORDER — POTASSIUM CHLORIDE 10 MEQ/50ML IV SOLN
10.0000 meq | Freq: Once | INTRAVENOUS | Status: AC
  Administered 2014-09-01: 10 meq via INTRAVENOUS

## 2014-09-01 MED ORDER — POTASSIUM CHLORIDE 10 MEQ/50ML IV SOLN
10.0000 meq | INTRAVENOUS | Status: AC
  Administered 2014-09-01 (×2): 10 meq via INTRAVENOUS
  Filled 2014-09-01 (×2): qty 50

## 2014-09-01 MED ORDER — POTASSIUM CHLORIDE 10 MEQ/50ML IV SOLN
10.0000 meq | INTRAVENOUS | Status: AC
  Administered 2014-09-01 (×3): 10 meq via INTRAVENOUS
  Filled 2014-09-01 (×3): qty 50

## 2014-09-01 NOTE — Progress Notes (Signed)
Pt 2000 BMET lab, K+ 2.8.E Link MD made aware; new order for additional 4 runs of potassium. Pt CBG 250, E Link MD ordered sliding scale with coverage Q4; Orders instruct to call MD again if CBG >400.

## 2014-09-01 NOTE — Progress Notes (Signed)
Pt's pedal and radial pulses absent; unable to palpate or doppler. E-Link MD made aware. Critical Care NP at bedside; No orders placed; Nurse instructed to continue to monitor pt, but findings are to be expected due to hypothermia protocol and pressures running.

## 2014-09-01 NOTE — Progress Notes (Signed)
Versed 1 mg/ml 35cc wasted in sink followed by water flush by 2 RN's.

## 2014-09-01 NOTE — Progress Notes (Signed)
Elink RN notified that for the second hour, glucostabilizer has ordered no insulin.  Requested orders on how to proceed.

## 2014-09-01 NOTE — ED Provider Notes (Signed)
Medical screening examination/treatment/procedure(s) were conducted as a shared visit with resident-physician practitioner(s) and myself.  I personally evaluated the patient during the encounter.  Pt is a 28 y.o. female with pmhx as above presenting after cardiopulmonary arrest. Per EMS report family heard the patient collapsed the bathroom. He went in to find her and she was unresponsive. EMS called. In our department on scene approximately 10 to the Dr. call and initiated CPR. By time of EMS arrival, patient found to be in coarse V. fib. In route she received a total of 4 defibrillations and 3 doses of epinephrine. Upon arrival to the ED patient is unresponsive, intubated, and sinus rhythm with stable blood pressure. EKG w/ bigeminy, T wave flattening.  GCS is 3. Pupils are 2 mm, equal, sluggish.  Pt found to have no signs of external trauma.  She has crackles at bilateral lung bases. Pt found ot have elevated WBC, K 2.6, LA 17.2. Will replaced K, will aslo given mag while waiting for result. CT head w/ acute findings. Plan for hypothermia protocol. CXR w/ possible developing pulm edema (likely early ARDS). CCM consulted for admission.   Spoke w/ family, pt had not been ill recently, had no falls or injuries, but does have chronic d/a from her Crohn's which I believe is the cause of her severe hypokalemia and likely cause of her arrest.   CRITICAL CARE Performed by: Ernestina Patches, E Total critical care time: 40 Critical care time was exclusive of separately billable procedures and treating other patients. Critical care was necessary to treat or prevent imminent or life-threatening deterioration. Critical care was time spent personally by me on the following activities: development of treatment plan with patient and/or surrogate as well as nursing, discussions with consultants, evaluation of patient's response to treatment, examination of patient, obtaining history from patient or surrogate, ordering  and performing treatments and interventions, ordering and review of laboratory studies, ordering and review of radiographic studies, pulse oximetry and re-evaluation of patient's condition.     EKG Interpretation  Date/Time:  Thursday August 31 2014 12:17:39 EDT Ventricular Rate:  84 PR Interval:  154 QRS Duration: 105 QT Interval:  488 QTC Calculation: 577 R Axis:   58 Text Interpretation:  Sinus rhythm Abnormal Q suggests anterior infarct Nonspecific repol abnormality, diffuse leads Prolonged QT interval No prior for comparison Confirmed by DOCHERTY  MD, MEGAN ([id number]) on 09/01/2014 7:20:38 AM        Ernestina Patches, MD 09/01/14 2575

## 2014-09-01 NOTE — Progress Notes (Signed)
Pt maxed on Neo at 200 mcg unable to titrate Levo off. E-link MD made aware and instructed to leave Levo on. Pt. Normal sinus rhythm few PVC's. Will continue to monitor.

## 2014-09-01 NOTE — Progress Notes (Signed)
EEG completed, results pending. 

## 2014-09-01 NOTE — Progress Notes (Signed)
*  PRELIMINARY RESULTS* Vascular Ultrasound Lower extremity venous duplex has been completed.  Preliminary findings: no evidence of DVT.  Landry Mellow, RDMS, RVT  09/01/2014, 10:40 AM

## 2014-09-01 NOTE — Progress Notes (Signed)
PULMONARY / CRITICAL CARE MEDICINE   Name: Julie Saunders MRN: [id number] DOB: 05-22-1986    ADMISSION DATE:  08/31/2014  REFERRING MD :  Dr. Jacelyn Grip   CHIEF COMPLAINT:  Cardiac Arrest    INITIAL PRESENTATION:  28 y/o female admitted 10/22 after VFib arrest at home and prolonged CPR (prolonged down time) when presenting to the ER.  Etiology felt to be hypokalemia from diarrhea.  To ICU for hypothermia protocol.  STUDIES:   10/16  Colonoscopy (Per [facility]) >> displays active duodenitis with areas of villous blunting/atrophy in addition to a small granuloma with multinucleated giant cells as seen in one of the fragments. Given the history of Crohn's disease, the changes are most compatible with involvement by the same disease process 10/22  CT head >> neg 10/22  CT cervical spine >> neg 10/22  ECHO >> RV normal, LVEF 20-25% 10/23 LE Doppler >>  SIGNIFICANT EVENTS: 10/22  Admit after several days diarrhea, cardiac arrest at home with prolonged CPR   SUBJECTIVE: Pulses weak in all four ext, otherwise undergoing hypothermia protocol as written  VITAL SIGNS: Temp:  [89.2 F (31.8 C)-98.4 F (36.9 C)] 91.8 F (33.2 C) (10/23 0700) Pulse Rate:  [36-137] 94 (10/23 0810) Resp:  [12-40] 22 (10/23 0810) BP: (90-147)/(25-95) 116/84 mmHg (10/23 0810) SpO2:  [84 %-100 %] 100 % (10/23 0810) Arterial Line BP: (97-134)/(65-94) 111/76 mmHg (10/23 0700) FiO2 (%):  [40 %-100 %] 60 % (10/23 0810) Weight:  [77 kg (169 lb 12.1 oz)-81.647 kg (180 lb)] 77 kg (169 lb 12.1 oz) (10/22 1503)  HEMODYNAMICS: CVP:  [8 mmHg-24 mmHg] 8 mmHg  VENTILATOR SETTINGS: Vent Mode:  [-] PRVC FiO2 (%):  [40 %-100 %] 60 % Set Rate:  [12 bmp-22 bmp] 22 bmp Vt Set:  [450 mL-460 mL] 460 mL PEEP:  [5 cmH20] 5 cmH20 Plateau Pressure:  [18 cmH20-25 cmH20] 22 cmH20  INTAKE / OUTPUT:  Intake/Output Summary (Last 24 hours) at 09/01/14 0914 Last data filed at 09/01/14 0700  Gross per 24 hour  Intake 3216.43 ml  Output     600 ml  Net 2616.43 ml    PHYSICAL EXAMINATION: General:  Sedated, paralyzed on vent HEENT: NCAT, ETT  PULM: CTA B CV: RRR, no mgr AB: limited exam due to pads Ext: cool, cannot palpate DP/PT pulses bilaterally but good cap refill Neuro: sedated, paralyzed on vent  LABS:  CBC  Recent Labs Lab 08/31/14 1219  08/31/14 2016 08/31/14 2214 09/01/14 0209  WBC 19.0*  --   --   --   --   HGB 12.0  < > 15.0 15.0 17.7*  HCT 37.4  < > 44.0 44.0 52.0*  PLT 203  --   --   --   --   < > = values in this interval not displayed. Coag's  Recent Labs Lab 08/31/14 1219 08/31/14 2015  APTT  --  33  INR 1.40 1.12   BMET  Recent Labs Lab 08/31/14 1559  08/31/14 2043 08/31/14 2214 09/01/14 0209 09/01/14 0609  NA 139  < > 139 137 135* 133*  K 2.7*  < > 3.2* 3.1* 3.0* 3.2*  CL 103  < > 104 103 103 100  CO2 22  --  18*  --   --  13*  BUN 4*  < > 4* <3* <3* 6  CREATININE 0.98  < > 0.91 0.80 0.90 1.07  GLUCOSE 172*  < > 281* 347* 351* 331*  < > =  values in this interval not displayed. Electrolytes  Recent Labs Lab 08/31/14 1559 08/31/14 2043 08/31/14 2121 09/01/14 0609  CALCIUM 6.8* 6.8*  --  6.6*  MG  --   --  1.9  --   PHOS  --   --  2.2*  --    Sepsis Markers  Recent Labs Lab 08/31/14 1219 08/31/14 1233  LATICACIDVEN 17.2* 15.78*   ABG  Recent Labs Lab 08/31/14 1757  PHART 7.416  PCO2ART 36.7  PO2ART 83.0   Liver Enzymes  Recent Labs Lab 08/31/14 1219 09/01/14 0359  AST 66* 62*  ALT 24 48*  ALKPHOS 90 97  BILITOT <0.2* 0.3  ALBUMIN 1.5* 1.5*   Cardiac Enzymes  Recent Labs Lab 08/31/14 1900 08/31/14 2328 09/01/14 0420  TROPONINI 1.27* 1.02* 0.97*   Glucose  Recent Labs Lab 08/31/14 2213 08/31/14 2312 09/01/14 0005 09/01/14 0206 09/01/14 0409 09/01/14 0608  GLUCAP 331* 364* 357* 316* 350* 288*    Imaging 10/22 CXR> ETT, CVL in place, bilateral upper lobe airspace disease  ASSESSMENT / PLAN:  PULMONARY  OETT 10/22 >>   A:   Acute hypoxemic respiratory failure - in the setting of cardiac arrest, doubt PE but d-dimer elevated Bilateral upper lobe opacities> likely edema but consider infiltrate with aspiration P:   Full vent support 8cc/kg Trend ABG per hypothermia protocol  CXR daily Assess LE venous duplex  CARDIOVASCULAR CVL L IJ 10/22 >> A:  VFib arrest - most likely hypokalemia, PE unlikely with normal RV  Shock > cardiogenic Prolonged QTc > resolved NSTEMI> stunned myocardium from  P:  Hypothermia protocol Neo/levophed to support MAP >85 Amiodarone gtt  Repeat EKG in am   RENAL A:    Hypokalemia Hypocalcemia Mixed metabolic acidosis> from diarrhea and lactic acidosis P:   500cc saline bolus now ABG now Lactic acid now Replace calcium now Replace potassium now Check magnesium now   GASTROINTESTINAL A:  Crohns - colonoscopy done 10/16 with multiple bx sites, neg for malignancy.  C/W Active Gastritis.   LLQ ABD Pain  Diarrhea  Elevated Ammonia resolved, due to shock Protein Calorie Malnutrition admission albumine 1.8 P:   GI prophylaxis with IV pepcid KUB now to assess for free air Monitor stool for melena / hematochezia  Consider TF in am 10/24 Hold home medications: pentasa, imodium  HEMATOLOGIC A:   Positive d-dimer with some leg swelling P:  Monitor CBC  DVT: SCD's + heparin sq LE doppler now  INFECTIOUS A:   Diarrhea - Crohns vs enterocolitis Doubt Peritonitis but had recent colonoscopy (admission belly exam normal)  P:   BCx2 10/22 >> Stool Culture 10/22 >>  Abx: Zosyn, start date10/22, day 1/x > consider stopping on 10/24  ENDOCRINE A:   Hyperglycemia - likely stress response, no hx of DM P:   Insulin gtt  NEUROLOGIC A:   Acute anoxic encephalopathy - post arrest  Possible seizure activity and decorticate posturing prior to hypothermia P:   RASS goal: -5 with hypothermia protocol  EEG x1    GLOBAL:    - Updates: Family updated at bedside  10/22.      TODAY'S SUMMARY: 28 y/o F admitted with cardiac arrest due to hypokalemia.  Prolonged down time.  Admitted for hypothermia protocol. High risk for severe anoxic brain injury.    EEG, KUB, LE doppler today   I have personally obtained a history, examined the patient, evaluated laboratory and imaging results, formulated the assessment and plan and placed orders. CRITICAL CARE:  The patient is critically ill with multiple organ systems failure and requires high complexity decision making for assessment and support, frequent evaluation and titration of therapies, application of advanced monitoring technologies and extensive interpretation of multiple databases. Critical Care Time devoted to patient care services described in this note is 45 minutes.   Roselie Awkward, MD [place] [facility] Pager: [phone] Cell: [phone] If no response, call [phone]   09/01/2014, 9:14 AM

## 2014-09-01 NOTE — Progress Notes (Signed)
Chaplain responded to a consult from nurse for support for pt family. Family not present at this time, but chaplain asked nurse to page her when family arrives. Will continue to follow. Pager [phone] Rolly Salter, Chaplain 09/01/2014 1:50 PM

## 2014-09-01 NOTE — Progress Notes (Signed)
[place] Progress Note Patient Name: Julie Saunders DOB: 1986-07-12 MRN: [id number]   Date of Service  09/01/2014  HPI/Events of Note  Called by bedside RN concern for inability to palpate pulses.    eICU Interventions  NP evaluated, difficult to palpate but on two pressors and being cooled.  Will not reverse cooling for now.  Left note for daytime MD to evaluate, will not contact vascular for now.        Toniqua Melamed 09/01/2014, 2:27 AM

## 2014-09-01 NOTE — Progress Notes (Signed)
Chaplain responded to page from nurse. Chaplain introduced herself to pt father. Pt father told chaplain about how he just lost his wife (pt mother) in August. Pt father was emotional at bedside as he told chaplain about his daughter. Chaplain offered prayer for pt. Will refer this to unit chaplain.   09/01/14 2200  Clinical Encounter Type  Visited With Patient and family together  Visit Type Follow-up;Spiritual support  Referral From Nurse;Family  Consult/Referral To Chaplain  Recommendations Follow Up  Spiritual Encounters  Spiritual Needs Emotional;Prayer;Grief support  Stress Factors  Family Stress Factors Major life changes;Loss;Health changes;Family relationships  Stepfanie Yott, Epifanio Lesches 09/01/2014 10:56 PM

## 2014-09-01 NOTE — Progress Notes (Signed)
[place] Progress Note Patient Name: Julie Saunders DOB: 1986-03-02 MRN: [id number]   Date of Service  09/01/2014  HPI/Events of Note  Post arrest patient in rewarming phase of hypothermia with MAP in 60's.  Patient has accelerated junctional rhythm on amiodarone drip, levophed, and neosynephrine.  Cause of arrest was vfib precipitated by hypokalemia.  eICU Interventions  Hold amiodarone drip 500 cc fluid bolus     Intervention Category Intermediate Interventions: Arrhythmia - evaluation and management  Mauri [place], P 09/01/2014, 11:37 PM

## 2014-09-01 NOTE — Progress Notes (Signed)
INITIAL NUTRITION ASSESSMENT  DOCUMENTATION CODES Per approved criteria  -Not Applicable   INTERVENTION: Once pt re-warmed, if unable to be extubated, recommend initiation of Vital AF 1.2 @ 20 ml/hr via OG tube and increase by 10 ml every 4 hours to goal rate of 65 ml/hr.   Tube feeding regimen provides 1872 kcal (103% of needs), 117 grams of protein, and 1316 ml of H2O.   NUTRITION DIAGNOSIS: Inadequate oral intake related to inability to eat as evidenced by NPO.   Goal: Pt to meet >/= 90% of their estimated nutrition needs   Monitor:  Weight trend, NPO, initiation of TF?, I/O's, vent status/settings  Reason for Assessment: Vent  28 y.o. female  Admitting Dx: <principal problem not specified>  ASSESSMENT: 28 y/o female admitted 10/22 after collapse with subsequent cardiac arrest at home and prolonged CPR (prolonged down time) when presenting to the ER. To ICU for hypothermia protocol.  - Pt with history of Crohn's Disease with ongoing diarrhea from 3-4 days at home prior to admission.  - Pt currently on hypothermia protocol. Per RN, pt to start re-warming this evening. Pt with no signs of fat or muscle wasting. Spoke with RN who double-checked that bed was zeroed and weight was accurate. 11 lb difference from weight three hours prior. Suspect admission wt error.   Patient is currently intubated on ventilator support MV: 12.9 L/min Temp (24hrs), Avg:93.5 F (34.2 C), Min:89.2 F (31.8 C), Max:98.4 F (36.9 C) - standard temp of 37 C used in calculation for needs.   Height: Ht Readings from Last 1 Encounters:  08/31/14 5\' 6"  (1.676 m)    Weight: Wt Readings from Last 1 Encounters:  08/31/14 169 lb 12.1 oz (77 kg)    Ideal Body Weight: 63.8 kg  % Ideal Body Weight: 121%  Wt Readings from Last 10 Encounters:  08/31/14 169 lb 12.1 oz (77 kg)  02/24/13 199 lb 3.2 oz (90.357 kg)  11/23/12 189 lb 15.4 oz (86.165 kg)  10/06/12 179 lb 3.2 oz (81.285 kg)  09/23/12 176  lb 14.4 oz (80.241 kg)    Usual Body Weight: unknown  % Usual Body Weight: n/a  BMI:  Body mass index is 27.41 kg/(m^2).  Estimated Nutritional Needs: Kcal: 1826 Protein: 100-120 g Fluid: Per MD  Skin: intact  Diet Order:    EDUCATION NEEDS: -Education not appropriate at this time   Intake/Output Summary (Last 24 hours) at 09/01/14 1013 Last data filed at 09/01/14 0928  Gross per 24 hour  Intake 3266.43 ml  Output    680 ml  Net 2586.43 ml    Last BM: 10/22   Labs:   Recent Labs Lab 08/31/14 1559  08/31/14 2043 08/31/14 2121 08/31/14 2214 09/01/14 0209 09/01/14 0609  NA 139  < > 139  --  137 135* 133*  K 2.7*  < > 3.2*  --  3.1* 3.0* 3.2*  CL 103  < > 104  --  103 103 100  CO2 22  --  18*  --   --   --  13*  BUN 4*  < > 4*  --  <3* <3* 6  CREATININE 0.98  < > 0.91  --  0.80 0.90 1.07  CALCIUM 6.8*  --  6.8*  --   --   --  6.6*  MG  --   --   --  1.9  --   --   --   PHOS  --   --   --  2.2*  --   --   --   GLUCOSE 172*  < > 281*  --  347* 351* 331*  < > = values in this interval not displayed.  CBG (last 3)   Recent Labs  09/01/14 0206 09/01/14 0409 09/01/14 0608  GLUCAP 316* 350* 288*    Scheduled Meds: . antiseptic oral rinse  7 mL Mouth Rinse QID  . artificial tears  1 application Both Eyes 3 times per day  . aspirin  300 mg Rectal NOW  . calcium gluconate  1 g Intravenous Once  . chlorhexidine  15 mL Mouth Rinse BID  . famotidine (PEPCID) IV  20 mg Intravenous Q12H  . heparin subcutaneous  5,000 Units Subcutaneous 3 times per day  . piperacillin-tazobactam (ZOSYN)  IV  3.375 g Intravenous 3 times per day  . potassium chloride  10 mEq Intravenous Q1 Hr x 3  . sodium bicarbonate  100 mEq Intravenous Once    Continuous Infusions: . amiodarone 30 mg/hr (08/31/14 2300)  . cisatracurium (NIMBEX) infusion 1 mcg/kg/min (08/31/14 1543)  . fentaNYL infusion INTRAVENOUS 75 mcg/hr (08/31/14 1601)  . insulin (NOVOLIN-R) infusion    . midazolam  (VERSED) infusion 2 mg/hr (09/01/14 0510)  . norepinephrine (LEVOPHED) Adult infusion 40 mcg/min (09/01/14 0814)  . phenylephrine (NEO-SYNEPHRINE) Adult infusion 200 mcg/min (09/01/14 0915)    Past Medical History  Diagnosis Date  . Crohn disease   . Migraine   . Allergy   . Pancytopenia 11/23/2012    History reviewed. No pertinent past surgical history.  Laurette Schimke RD, LDN

## 2014-09-01 NOTE — Progress Notes (Signed)
CRITICAL VALUE ALERT  Critical value received:  Tropin    Date of notification:  08/31/14  Time of notification:  1900  Critical value read back: yes  Nurse who received alert:  Duanne Moron RN  Responding MD: Titus Mould MD   Time MD responded:  1915 08/31/14

## 2014-09-02 ENCOUNTER — Inpatient Hospital Stay (HOSPITAL_COMMUNITY): Payer: 59

## 2014-09-02 DIAGNOSIS — R1010 Upper abdominal pain, unspecified: Secondary | ICD-10-CM

## 2014-09-02 DIAGNOSIS — J9600 Acute respiratory failure, unspecified whether with hypoxia or hypercapnia: Secondary | ICD-10-CM

## 2014-09-02 LAB — HEPATIC FUNCTION PANEL
ALT: 44 U/L — ABNORMAL HIGH (ref 0–35)
AST: 69 U/L — ABNORMAL HIGH (ref 0–37)
Albumin: 2.1 g/dL — ABNORMAL LOW (ref 3.5–5.2)
Alkaline Phosphatase: 68 U/L (ref 39–117)
BILIRUBIN TOTAL: 0.4 mg/dL (ref 0.3–1.2)
Total Protein: 5.1 g/dL — ABNORMAL LOW (ref 6.0–8.3)

## 2014-09-02 LAB — BASIC METABOLIC PANEL
ANION GAP: 16 — AB (ref 5–15)
Anion gap: 14 (ref 5–15)
Anion gap: 15 (ref 5–15)
Anion gap: 20 — ABNORMAL HIGH (ref 5–15)
BUN: 8 mg/dL (ref 6–23)
BUN: 8 mg/dL (ref 6–23)
BUN: 8 mg/dL (ref 6–23)
BUN: 10 mg/dL (ref 6–23)
CALCIUM: 7.1 mg/dL — AB (ref 8.4–10.5)
CHLORIDE: 95 meq/L — AB (ref 96–112)
CHLORIDE: 92 meq/L — AB (ref 96–112)
CO2: 14 meq/L — AB (ref 19–32)
CO2: 13 mEq/L — ABNORMAL LOW (ref 19–32)
CO2: 13 mEq/L — ABNORMAL LOW (ref 19–32)
CO2: 15 mEq/L — ABNORMAL LOW (ref 19–32)
CREATININE: 1.37 mg/dL — AB (ref 0.50–1.10)
Calcium: 7 mg/dL — ABNORMAL LOW (ref 8.4–10.5)
Calcium: 7.2 mg/dL — ABNORMAL LOW (ref 8.4–10.5)
Calcium: 7.1 mg/dL — ABNORMAL LOW (ref 8.4–10.5)
Chloride: 100 mEq/L (ref 96–112)
Chloride: 98 mEq/L (ref 96–112)
Creatinine, Ser: 1.43 mg/dL — ABNORMAL HIGH (ref 0.50–1.10)
Creatinine, Ser: 1.59 mg/dL — ABNORMAL HIGH (ref 0.50–1.10)
Creatinine, Ser: 1.9 mg/dL — ABNORMAL HIGH (ref 0.50–1.10)
GFR calc Af Amer: 58 mL/min — ABNORMAL LOW (ref >90)
GFR calc Af Amer: 51 mL/min — ABNORMAL LOW (ref >90)
GFR calc Af Amer: 41 mL/min — ABNORMAL LOW (ref >90)
GFR calc non Af Amer: 52 mL/min — ABNORMAL LOW (ref >90)
GFR calc non Af Amer: 44 mL/min — ABNORMAL LOW (ref >90)
GFR calc non Af Amer: 35 mL/min — ABNORMAL LOW (ref >90)
GFR, EST AFRICAN AMERICAN: 61 mL/min — AB (ref >90)
GFR, EST NON AFRICAN AMERICAN: 50 mL/min — AB (ref >90)
GLUCOSE: 90 mg/dL (ref 70–99)
GLUCOSE: 102 mg/dL — AB (ref 70–99)
Glucose, Bld: 98 mg/dL (ref 70–99)
Glucose, Bld: 87 mg/dL (ref 70–99)
POTASSIUM: 4.4 meq/L (ref 3.7–5.3)
Potassium: 3.3 mEq/L — ABNORMAL LOW (ref 3.7–5.3)
Potassium: 4.4 mEq/L (ref 3.7–5.3)
Potassium: 4.2 mEq/L (ref 3.7–5.3)
SODIUM: 128 meq/L — AB (ref 137–147)
SODIUM: 127 meq/L — AB (ref 137–147)
Sodium: 123 mEq/L — ABNORMAL LOW (ref 137–147)
Sodium: 127 mEq/L — ABNORMAL LOW (ref 137–147)

## 2014-09-02 LAB — MAGNESIUM: Magnesium: 2.3 mg/dL (ref 1.5–2.5)

## 2014-09-02 LAB — GLUCOSE, CAPILLARY
GLUCOSE-CAPILLARY: 82 mg/dL (ref 70–99)
Glucose-Capillary: 114 mg/dL — ABNORMAL HIGH (ref 70–99)
Glucose-Capillary: 124 mg/dL — ABNORMAL HIGH (ref 70–99)
Glucose-Capillary: 82 mg/dL (ref 70–99)
Glucose-Capillary: 85 mg/dL (ref 70–99)
Glucose-Capillary: 96 mg/dL (ref 70–99)

## 2014-09-02 LAB — CBC WITH DIFFERENTIAL/PLATELET
BASOS ABS: 0 10*3/uL (ref 0.0–0.1)
Basophils Relative: 0 % (ref 0–1)
EOS PCT: 0 % (ref 0–5)
Eosinophils Absolute: 0 10*3/uL (ref 0.0–0.7)
HCT: 41.6 % (ref 36.0–46.0)
Hemoglobin: 14.9 g/dL (ref 12.0–15.0)
Lymphocytes Relative: 6 % — ABNORMAL LOW (ref 12–46)
Lymphs Abs: 2 10*3/uL (ref 0.7–4.0)
MCH: 30.2 pg (ref 26.0–34.0)
MCHC: 35.8 g/dL (ref 30.0–36.0)
MCV: 84.2 fL (ref 78.0–100.0)
Monocytes Absolute: 2 10*3/uL — ABNORMAL HIGH (ref 0.1–1.0)
Monocytes Relative: 6 % (ref 3–12)
NEUTROS PCT: 88 % — AB (ref 43–77)
Neutro Abs: 29.1 10*3/uL — ABNORMAL HIGH (ref 1.7–7.7)
PLATELETS: 171 10*3/uL (ref 150–400)
RBC: 4.94 MIL/uL (ref 3.87–5.11)
RDW: 13.5 % (ref 11.5–15.5)
WBC MORPHOLOGY: INCREASED
WBC: 33.1 10*3/uL — ABNORMAL HIGH (ref 4.0–10.5)

## 2014-09-02 LAB — CBC
HCT: 40.6 % (ref 36.0–46.0)
Hemoglobin: 14.8 g/dL (ref 12.0–15.0)
MCH: 30.6 pg (ref 26.0–34.0)
MCHC: 36.5 g/dL — ABNORMAL HIGH (ref 30.0–36.0)
MCV: 84.1 fL (ref 78.0–100.0)
Platelets: 172 10*3/uL (ref 150–400)
RBC: 4.83 MIL/uL (ref 3.87–5.11)
RDW: 13.4 % (ref 11.5–15.5)
WBC: 35.8 10*3/uL — ABNORMAL HIGH (ref 4.0–10.5)

## 2014-09-02 LAB — POCT I-STAT 3, ART BLOOD GAS (G3+)
Acid-base deficit: 11 mmol/L — ABNORMAL HIGH (ref 0.0–2.0)
BICARBONATE: 12.4 meq/L — AB (ref 20.0–24.0)
O2 Saturation: 98 %
PCO2 ART: 22.8 mmHg — AB (ref 35.0–45.0)
PH ART: 7.345 — AB (ref 7.350–7.450)
PO2 ART: 110 mmHg — AB (ref 80.0–100.0)
Patient temperature: 37.2
TCO2: 13 mmol/L (ref 0–100)

## 2014-09-02 LAB — CORTISOL: Cortisol, Plasma: 110.7 ug/dL

## 2014-09-02 MED ORDER — VITAL HIGH PROTEIN PO LIQD
1000.0000 mL | ORAL | Status: DC
  Filled 2014-09-02 (×2): qty 1000

## 2014-09-02 MED ORDER — VANCOMYCIN HCL IN DEXTROSE 750-5 MG/150ML-% IV SOLN
750.0000 mg | Freq: Two times a day (BID) | INTRAVENOUS | Status: DC
  Administered 2014-09-02 – 2014-09-03 (×3): 750 mg via INTRAVENOUS
  Filled 2014-09-02 (×4): qty 150

## 2014-09-02 MED ORDER — VITAL AF 1.2 CAL PO LIQD
1000.0000 mL | ORAL | Status: DC
  Filled 2014-09-02 (×2): qty 1000

## 2014-09-02 MED ORDER — HYDROCORTISONE NA SUCCINATE PF 100 MG IJ SOLR
50.0000 mg | Freq: Four times a day (QID) | INTRAMUSCULAR | Status: DC
  Administered 2014-09-02 – 2014-09-03 (×4): 50 mg via INTRAVENOUS
  Filled 2014-09-02 (×8): qty 1

## 2014-09-02 MED ORDER — SODIUM CHLORIDE 0.9 % IV BOLUS (SEPSIS)
500.0000 mL | Freq: Once | INTRAVENOUS | Status: AC
  Administered 2014-09-01: 500 mL via INTRAVENOUS

## 2014-09-02 MED ORDER — VITAL AF 1.2 CAL PO LIQD
1000.0000 mL | ORAL | Status: DC
  Administered 2014-09-03: 1000 mL
  Filled 2014-09-02 (×8): qty 1000

## 2014-09-02 MED ORDER — POTASSIUM CHLORIDE 10 MEQ/50ML IV SOLN
10.0000 meq | INTRAVENOUS | Status: AC
  Administered 2014-09-02 (×3): 10 meq via INTRAVENOUS
  Filled 2014-09-02 (×3): qty 50

## 2014-09-02 MED ORDER — SODIUM CHLORIDE 0.9 % IV BOLUS (SEPSIS)
500.0000 mL | Freq: Once | INTRAVENOUS | Status: AC
  Administered 2014-09-02: 500 mL via INTRAVENOUS

## 2014-09-02 MED ORDER — VASOPRESSIN 20 UNIT/ML IV SOLN
0.0300 [IU]/min | INTRAVENOUS | Status: DC
  Administered 2014-09-02 – 2014-09-03 (×2): 0.03 [IU]/min via INTRAVENOUS
  Filled 2014-09-02 (×2): qty 2

## 2014-09-02 MED ORDER — ALBUMIN HUMAN 25 % IV SOLN
25.0000 g | Freq: Once | INTRAVENOUS | Status: AC
  Administered 2014-09-02: 25 g via INTRAVENOUS
  Filled 2014-09-02: qty 100

## 2014-09-02 MED ORDER — IOHEXOL 300 MG/ML  SOLN
25.0000 mL | INTRAMUSCULAR | Status: AC
  Administered 2014-09-02 (×2): 25 mL via ORAL

## 2014-09-02 MED ORDER — ONDANSETRON HCL 4 MG/2ML IJ SOLN
4.0000 mg | Freq: Once | INTRAMUSCULAR | Status: AC
  Administered 2014-09-02: 4 mg via INTRAVENOUS
  Filled 2014-09-02: qty 2

## 2014-09-02 MED ORDER — SODIUM CHLORIDE 0.9 % IV SOLN
1250.0000 mg | Freq: Once | INTRAVENOUS | Status: AC
  Administered 2014-09-02: 1250 mg via INTRAVENOUS
  Filled 2014-09-02: qty 1250

## 2014-09-02 MED ORDER — SODIUM CHLORIDE 0.9 % IV SOLN
100.0000 mg | INTRAVENOUS | Status: DC
  Administered 2014-09-02: 100 mg via INTRAVENOUS
  Filled 2014-09-02 (×2): qty 100

## 2014-09-02 MED ORDER — IOHEXOL 300 MG/ML  SOLN
80.0000 mL | Freq: Once | INTRAMUSCULAR | Status: AC | PRN
  Administered 2014-09-02: 80 mL via INTRAVENOUS

## 2014-09-02 MED ORDER — SODIUM CHLORIDE 0.9 % IV SOLN: 100.0000 mg | Freq: Every day | INTRAVENOUS | Status: DC

## 2014-09-02 MED ORDER — SODIUM BICARBONATE 8.4 % IV SOLN
INTRAVENOUS | Status: DC
  Administered 2014-09-02 – 2014-09-03 (×2): via INTRAVENOUS
  Filled 2014-09-02 (×8): qty 150

## 2014-09-02 MED ORDER — POTASSIUM CHLORIDE 10 MEQ/100ML IV SOLN
10.0000 meq | INTRAVENOUS | Status: DC
  Administered 2014-09-02: 10 meq via INTRAVENOUS
  Filled 2014-09-02: qty 100

## 2014-09-02 NOTE — Progress Notes (Signed)
Patient again hypotensive with BP upper 70s/40s to 90s/50s. MAP not at goal of 85. Dr. Tamala Julian notified, will start vasopressin gtt and give albumin

## 2014-09-02 NOTE — Progress Notes (Signed)
NUTRITION FOLLOW UP  Intervention:   Initiate Vital AF 1.2 @ 20 ml/hr via OG tube and increase by 10 ml every 4 hours to goal rate of 65 ml/hr.   Tube feeding regimen provides 1872 kcal (103% of needs), 117 grams of protein, and 1316 ml of H2O.   Nutrition Dx:   Inadequate oral intake related to inability to eat as evidenced by NPO.   Goal:   Pt to meet >/= 90% of their estimated nutrition needs   Monitor:   Weight trend, NPO, initiation of TF?, I/O's, vent status/settings  Assessment:   28 y/o female admitted 10/22 after collapse with subsequent cardiac arrest at home and prolonged CPR (prolonged down time) when presenting to the ER. To ICU for hypothermia protocol. RD received consult for TF initiation and management.  Reviewed RD note on 09/01/14. Hypothermia protocol was activated. Pt has now been rewarmed.  Patient remains intubated on ventilator support. OGT remains in place.  MV: 13.2 L/min Temp (24hrs), Avg:93 F (33.9 C), Min:89.2 F (31.8 C), Max:98.2 F (36.8 C)  Propofol: n/a   Adult TF Protocol has been initiated. Noted orders for Vital High Protein @ 40 ml/hr, which will provide 960 kcals, 84 grams protein, and 803 grams protein daily.  Labs reviewed. Na: 127, CO2: 13, Creat: 1.43 Calcium: 7.1, Phos: 2.2 (08/31/14). Mg and K WDL.   Height: Ht Readings from Last 1 Encounters:  09/01/14 5\' 6"  (1.676 m)    Weight Status:   Wt Readings from Last 1 Encounters:  08/31/14 169 lb 12.1 oz (77 kg)    Re-estimated needs:  Kcal: 1804.8 Protein: 100-110 grams Fluid: >1.8 L  Skin: Intact  Diet Order:  NPO   Intake/Output Summary (Last 24 hours) at 09/02/14 1030 Last data filed at 09/02/14 0700  Gross per 24 hour  Intake 4234.86 ml  Output    624 ml  Net 3610.86 ml    Last BM: 09/01/14   Labs:   Recent Labs Lab 08/31/14 1219  08/31/14 1413  08/31/14 2121  09/01/14 1000  09/01/14 2148 09/02/14 0157 09/02/14 0525  NA 139  --   --   < >  --   < >  131*  < > 129* 128* 127*  K 2.6*  --   --   < >  --   < > 3.3*  < > 3.5* 3.3* 4.4  CL 93*  --   --   < >  --   < > 100  < > 101 100 98  CO2 14*  --   --   < >  --   < > 11*  < > 12* 14* 13*  BUN 4*  --   --   < >  --   < > 7  < > 7 8 8   CREATININE 0.86  --   --   < >  --   < > 1.15*  < > 1.28* 1.37* 1.43*  CALCIUM 7.6*  --   --   < >  --   < > 6.7*  < > 6.9* 7.0* 7.1*  MG  --   < > 1.5  --  1.9  --  4.0*  --   --   --  2.3  PHOS  --   --  3.1  --  2.2*  --   --   --   --   --   --   GLUCOSE 415*  --   --   < >  --   < >  257*  < > 107* 98 87  < > = values in this interval not displayed.  CBG (last 3)   Recent Labs  09/02/14 0006 09/02/14 0407 09/02/14 0729  GLUCAP 96 85 82    Scheduled Meds: . antiseptic oral rinse  7 mL Mouth Rinse QID  . artificial tears  1 application Both Eyes 3 times per day  . chlorhexidine  15 mL Mouth Rinse BID  . famotidine (PEPCID) IV  20 mg Intravenous Q12H  . feeding supplement (VITAL HIGH PROTEIN)  1,000 mL Per Tube Q24H  . heparin subcutaneous  5,000 Units Subcutaneous 3 times per day  . hydrocortisone sodium succinate  50 mg Intravenous Q6H  . insulin glargine  10 Units Subcutaneous Q24H  . micafungin (MYCAMINE) IV  100 mg Intravenous Q24H  . piperacillin-tazobactam (ZOSYN)  IV  3.375 g Intravenous 3 times per day  . sodium chloride  500 mL Intravenous Once  . sodium chloride  10-40 mL Intracatheter Q12H  . vancomycin  1,250 mg Intravenous Once  . vancomycin  750 mg Intravenous Q12H    Continuous Infusions: . sodium chloride    . sodium chloride    . amiodarone Stopped (09/01/14 2330)  . dextrose    . fentaNYL infusion INTRAVENOUS Stopped (09/02/14 0645)  . norepinephrine (LEVOPHED) Adult infusion 26 mcg/min (09/02/14 0859)  . phenylephrine (NEO-SYNEPHRINE) Adult infusion 200 mcg/min (09/02/14 0845)  .  sodium bicarbonate  infusion 1000 mL    . vasopressin (PITRESSIN) infusion - *FOR SHOCK* 0.03 Units/min (09/02/14 0412)    Jaden Abreu A.  Jimmye Norman, RD, LDN Pager: [phone] After hours Pager: [phone]

## 2014-09-02 NOTE — Progress Notes (Signed)
[facility] Progress Note Patient Name: FLORRIE RAMIRES DOB: Jun 18, 1986 MRN: [id number]   Date of Service  09/02/2014  HPI/Events of Note  CT noted.  Edema, no perf.  I see no reason to not restart TF.  eICU Interventions  Start TF per nutrition recommendations and check residuals per protocol.        YACOUB,WESAM 09/02/2014, 3:54 PM

## 2014-09-02 NOTE — Progress Notes (Addendum)
PULMONARY / CRITICAL CARE MEDICINE   Name: Julie Saunders MRN: [id number] DOB: 07-Sep-1986    ADMISSION DATE:  08/31/2014  REFERRING MD :  Dr. Jacelyn Grip   CHIEF COMPLAINT:  Cardiac Arrest    INITIAL PRESENTATION:  28 y/o female admitted 10/22 after VFib arrest at home and prolonged CPR (prolonged down time) when presenting to the ER.  Etiology felt to be hypokalemia from diarrhea.  To ICU for hypothermia protocol.  STUDIES:   10/16  Colonoscopy (Per [facility]) >> displays active duodenitis with areas of villous blunting/atrophy in addition to a small granuloma with multinucleated giant cells as seen in one of the fragments. Given the history of Crohn's disease, the changes are most compatible with involvement by the same disease process 10/22  CT head >> neg 10/22  CT cervical spine >> neg 10/22  ECHO >> RV normal, LVEF 20-25% 10/23 LE Doppler >>neg  SIGNIFICANT EVENTS: 10/22  Admit after several days diarrhea, cardiac arrest at home with prolonged CPR 10/24- rewarmed, follows commands  SUBJECTIVE: follows commands  VITAL SIGNS: Temp:  [89.2 F (31.8 C)-98.2 F (36.8 C)] 98.2 F (36.8 C) (10/24 0800) Pulse Rate:  [73-104] 104 (10/24 0752) Resp:  [22-29] 28 (10/24 0752) BP: (95-112)/(66-82) 109/66 mmHg (10/24 0752) SpO2:  [93 %-100 %] 100 % (10/24 0800) Arterial Line BP: (82-119)/(53-88) 106/64 mmHg (10/24 0700) FiO2 (%):  [40 %] 40 % (10/24 0800)  HEMODYNAMICS: CVP:  [6 mmHg-11 mmHg] 10 mmHg  VENTILATOR SETTINGS: Vent Mode:  [-] PRVC FiO2 (%):  [40 %] 40 % Set Rate:  [28 bmp] 28 bmp Vt Set:  [420 mL] 420 mL PEEP:  [5 cmH20] 5 cmH20 Plateau Pressure:  [18 cmH20-21 cmH20] 18 cmH20  INTAKE / OUTPUT:  Intake/Output Summary (Last 24 hours) at 09/02/14 0958 Last data filed at 09/02/14 0700  Gross per 24 hour  Intake 4424.76 ml  Output    624 ml  Net 3800.76 ml    PHYSICAL EXAMINATION: General:  No pain, awakens, vented HEENT: no pain to palpation neck, collar PULM:  ronchi CV: RRR, no mgr AB: BS hypo, no r/g, soft Ext: good cap refil, color wnl and improved all ext Neuro: rass -1, sedation was given  LABS:  CBC  Recent Labs Lab 08/31/14 1219  09/01/14 0209 09/02/14 0525 09/02/14 0735  WBC 19.0*  --   --  33.1* 35.8*  HGB 12.0  < > 17.7* 14.9 14.8  HCT 37.4  < > 52.0* 41.6 40.6  PLT 203  --   --  171 172  < > = values in this interval not displayed. Coag's  Recent Labs Lab 08/31/14 1219 08/31/14 1413 08/31/14 2015  APTT  --  32 33  INR 1.40 1.14 1.12   BMET  Recent Labs Lab 09/01/14 2148 09/02/14 0157 09/02/14 0525  NA 129* 128* 127*  K 3.5* 3.3* 4.4  CL 101 100 98  CO2 12* 14* 13*  BUN 7 8 8   CREATININE 1.28* 1.37* 1.43*  GLUCOSE 107* 98 87   Electrolytes  Recent Labs Lab 08/31/14 1219  08/31/14 1413  08/31/14 2121  09/01/14 1000  09/01/14 2148 09/02/14 0157 09/02/14 0525  CALCIUM 7.6*  --   --   < >  --   < > 6.7*  < > 6.9* 7.0* 7.1*  MG  --   < > 1.5  --  1.9  --  4.0*  --   --   --  2.3  PHOS  --   --  3.1  --  2.2*  --   --   --   --   --   --   < > = values in this interval not displayed. Sepsis Markers  Recent Labs Lab 08/31/14 1219 08/31/14 1233 09/01/14 1027  LATICACIDVEN 17.2* 15.78* 4.6*   ABG  Recent Labs Lab 08/31/14 1757 09/01/14 0955  PHART 7.416 7.269*  PCO2ART 36.7 28.1*  PO2ART 83.0 213.0*   Liver Enzymes  Recent Labs Lab 08/31/14 1219 09/01/14 0359 09/02/14 0525  AST 66* 62* 69*  ALT 24 48* 44*  ALKPHOS 90 97 68  BILITOT <0.2* 0.3 0.4  ALBUMIN 1.5* 1.5* 2.1*   Cardiac Enzymes  Recent Labs Lab 08/31/14 1900 08/31/14 2328 09/01/14 0420  TROPONINI 1.27* 1.02* 0.97*   Glucose  Recent Labs Lab 09/01/14 1753 09/01/14 1854 09/01/14 2000 09/02/14 0006 09/02/14 0407 09/02/14 0729  GLUCAP 132* 121* 127* 96 85 82    Imaging 10/24CXR> ETT, cvl noted, diffuse int infiltrate rt greater left  ASSESSMENT / PLAN:  PULMONARY  OETT 10/22 >>  A:   Acute  hypoxemic respiratory failure - in the setting of cardiac arrest, likely secondary to QTC from hypoK Bilateral upper lobe opacities> likely edema but consider infiltrate with aspiration P:   Full vent support 8cc/kg abg now for correction acidosis, goal to reduce guaranteed MV Would like to wean cpap 5 ps 5, goal 2 hrs, hemodynamics may be an issue for extubaition Assess LE venous duplex - neg pcxr follow up in am Even balance remains as goal or pos with current BP  CARDIOVASCULAR CVL L IJ 10/22 >> A:  VFib arrest - most likely hypokalemia, PE unlikely with normal RV  Shock > cardiogenic likely IS this septic shock? See ID Prolonged QTc > resolved NSTEMI> stunned myocardium from  R/o rel AI Reduced EF, septic cardiomyoapthy? takasub? P:  Hypothermia protocol off, now rewarmed MAP 65 remains with ext hypoperfusion Amiodarone gtt off as JXNAL / brady association Repeat EKG in am  Consider dc neo and increase levo if needed as neo weak pressor Keep vaso Cortisol then empiric roids tsh ensure done cvp noted, bolus Will need echo repeat in 1 week Will need cards input soon  RENAL A:    Hypokalemia Hypocalcemia Mixed metabolic acidosis> from diarrhea and lactic acidosis P:   500cc saline bolus now ABG now Lactic acid is encouraging Replace potassium to stop as K 4.4 Add bicarb drip for NONA G with active stools Chem in pm and am   GASTROINTESTINAL A:  Crohns - colonoscopy done 10/16 with multiple bx sites, neg for malignancy.  C/W Active Gastritis.   LLQ ABD Pain - r/o septic source Diarrhea  Elevated Ammonia resolved, due to shock Protein Calorie Malnutrition admission albumine 1.8 P:   GI prophylaxis with IV pepcid Start TF Hold home medications: pentasa, imodium  HEMATOLOGIC A:   dvt prevention P:  Monitor CBC in am   DVT: SCD's + heparin sq LE doppler neg  INFECTIOUS A:   Diarrhea - Crohns vs enterocolitis R/o abdo source (remains in shock,  leukocytosis) P:   BCx2 10/22 >> Stool Culture 10/22 >>  Abx: Zosyn 10/22>>> vanc 10/24>> casp 10/24>>>  Add vanc Add caspo CT abdo required  ENDOCRINE A:   Hyperglycemia - likely stress response, no hx of DM P:   Insulin gtt off ssi Get cortisol  NEUROLOGIC A:   Acute anoxic encephalopathy - post arrest  Possible seizure activity and decorticate posturing prior to  hypothermia Improved neuro P:   RASS goal: 0 fent wua Dc collar as clinical and CT cleared   GLOBAL:    - Updates: Family updated at bedside 10/24   TODAY'S SUMMARY: remains in shock, add vanc, caspo, CT abdo, wenaing, NO extubation, dc neo, use levo / vaso   I have personally obtained a history, examined the patient, evaluated laboratory and imaging results, formulated the assessment and plan and placed orders. CRITICAL CARE: The patient is critically ill with multiple organ systems failure and requires high complexity decision making for assessment and support, frequent evaluation and titration of therapies, application of advanced monitoring technologies and extensive interpretation of multiple databases. Critical Care Time devoted to patient care services described in this note is 30 minutes.   Lavon Paganini. Titus Mould, MD, [place] Pgr: [facility]

## 2014-09-02 NOTE — Progress Notes (Signed)
35cc Versed drip (1mg  per ml) wasted in sink followed by water flush by 2  RN.

## 2014-09-02 NOTE — Progress Notes (Signed)
[facility] Physician-Brief Progress Note Patient Name: Julie Saunders DOB: 08-14-1986 MRN: [id number]   Date of Service  09/02/2014  HPI/Events of Note  hypokalemia  eICU Interventions  replaced     Intervention Category Minor Interventions: Electrolytes abnormality - evaluation and management  Mauri Brooklyn, P 09/02/2014, 2:50 AM

## 2014-09-02 NOTE — Progress Notes (Signed)
Dr. Nelda Marseille notified of pt vomiting prior to starting tube feedings.  Orders received to hold tube feedings until tomorrow and place og to low intermittent suction.  Also notified of decreasing urine output.  Will continue to monitor pt closely.

## 2014-09-02 NOTE — Progress Notes (Signed)
ANTIBIOTIC CONSULT NOTE - INITIAL  Pharmacy Consult for Zosyn, Vancomycin Indication: r/o abdominal infection/sepsis  Allergies  Allergen Reactions  . Sulfa Antibiotics     Patient Measurements: Height: 5' 6"  (167.6 cm) Weight: 169 lb 12.1 oz (77 kg) IBW/kg (Calculated) : 59.3 Vital Signs: Temp: 98.2 F (36.8 C) (10/24 0800) Temp Source: Core (Comment) (10/24 0800) BP: 109/66 mmHg (10/24 0752) Pulse Rate: 104 (10/24 0752) Intake/Output from previous day: 10/23 0701 - 10/24 0700 In: 4810.5 [I.V.:3310.5; NG/GT:50; IV Piggyback:1450] Out: 704 [YYQMG:500] Intake/Output from this shift:    Labs:  Recent Labs  08/31/14 1219  09/01/14 0209  09/01/14 2148 09/02/14 0157 09/02/14 0525 09/02/14 0735  WBC 19.0*  --   --   --   --   --  33.1* 35.8*  HGB 12.0  < > 17.7*  --   --   --  14.9 14.8  PLT 203  --   --   --   --   --  171 172  CREATININE 0.86  < > 0.90  < > 1.28* 1.37* 1.43*  --   < > = values in this interval not displayed. Estimated Creatinine Clearance: 61.9 ml/min (by C-G formula based on Cr of 1.43). No results found for this basename: VANCOTROUGH, Corlis Leak, VANCORANDOM, [place], [place], [place], [place], [place], TOBRARND, AMIKACINPEAK, AMIKACINTROU, AMIKACIN,  in the last 72 hours   Microbiology: Recent Results (from the past 720 hour(s))  MRSA PCR SCREENING     Status: None   Collection Time    08/31/14  3:52 PM      Result Value Ref Range Status   MRSA by PCR NEGATIVE  NEGATIVE Final   Comment:            The GeneXpert MRSA Assay (FDA     approved for NASAL specimens     only), is one component of a     comprehensive MRSA colonization     surveillance program. It is not     intended to diagnose MRSA     infection nor to guide or     monitor treatment for     MRSA infections.  CLOSTRIDIUM DIFFICILE BY PCR     Status: None   Collection Time    09/01/14  2:37 AM      Result Value Ref Range Status   C difficile by pcr NEGATIVE   NEGATIVE Final    Medical History: Past Medical History  Diagnosis Date  . Crohn disease   . Migraine   . Allergy   . Pancytopenia 11/23/2012    Medications:  Anti-infectives   Start     Dose/Rate Route Frequency Ordered Stop   09/02/14 2300  vancomycin (VANCOCIN) IVPB 750 mg/150 ml premix     750 mg 150 mL/hr over 60 Minutes Intravenous Every 12 hours 09/02/14 1026     09/02/14 1130  micafungin (MYCAMINE) 100 mg in sodium chloride 0.9 % 100 mL IVPB     100 mg 100 mL/hr over 1 Hours Intravenous Every 24 hours 09/02/14 1014     09/02/14 1100  vancomycin (VANCOCIN) 1,250 mg in sodium chloride 0.9 % 250 mL IVPB     1,250 mg 166.7 mL/hr over 90 Minutes Intravenous  Once 09/02/14 1026     09/02/14 1015  micafungin (MYCAMINE) 100 mg in sodium chloride 0.9 % 100 mL IVPB  Status:  Discontinued     100 mg 100 mL/hr over 1 Hours Intravenous Daily 09/02/14 1014 09/02/14 1015  09/01/14 1600  fluconazole (DIFLUCAN) IVPB 400 mg  Status:  Discontinued     400 mg 100 mL/hr over 120 Minutes Intravenous Every 24 hours 08/31/14 1556 08/31/14 1701   08/31/14 2200  piperacillin-tazobactam (ZOSYN) IVPB 3.375 g     3.375 g 12.5 mL/hr over 240 Minutes Intravenous 3 times per day 08/31/14 1556     08/31/14 1600  piperacillin-tazobactam (ZOSYN) IVPB 3.375 g     3.375 g 100 mL/hr over 30 Minutes Intravenous  Once 08/31/14 1550 08/31/14 1902   08/31/14 1600  fluconazole (DIFLUCAN) IVPB 800 mg  Status:  Discontinued     800 mg 100 mL/hr over 240 Minutes Intravenous  Once 08/31/14 1556 08/31/14 1701     Assessment: 28 year old female admitted 08/31/14 after a collapse at home and subsequent cardiac arrest with prolonged CPR initiated on hypothermia protocol to start IV abx per pharmacy for r/o abdominal infection. Notable PMH includes crohn's disease.   Patient is now rewarmed, WBC continue to trend up to 35.89 (noted patient on Solu-Cortef), tmax 98.2. With continued leukocytosis, antibiotics broadened  to include vancomycin and micafungin. SCr continues to trend up to 1.43 (CrCl ~60 ml/min).  10/22 Zosyn>> 10/24 Vanc>> 10/24 micafungin>>  Stool Culture 10/22 >>  Cdiff PCR 10/23>>NEG  Goal of Therapy:  Vancomycin level: 15-20  Clinical resolution of infection  Plan:  - Start Vancomycin 1250 mg IV x 1, followed by 750 mg IV q12h - Continue Zosyn 3.375 gm IV q8h (4 hr infusion) - Micafungin 100 mg IV q24h per MD - Monitor renal function, temp, WBC, C&S, VT as indicated  Harolyn Rutherford, PharmD Clinical Pharmacist - Resident Pager: [phone] Pharmacy: [phone] 09/02/2014 10:32 AM

## 2014-09-03 ENCOUNTER — Encounter (HOSPITAL_COMMUNITY): Payer: Self-pay | Admitting: Gastroenterology

## 2014-09-03 ENCOUNTER — Inpatient Hospital Stay (HOSPITAL_COMMUNITY): Payer: 59

## 2014-09-03 LAB — GLUCOSE, CAPILLARY
GLUCOSE-CAPILLARY: 85 mg/dL (ref 70–99)
Glucose-Capillary: 103 mg/dL — ABNORMAL HIGH (ref 70–99)
Glucose-Capillary: 120 mg/dL — ABNORMAL HIGH (ref 70–99)
Glucose-Capillary: 129 mg/dL — ABNORMAL HIGH (ref 70–99)
Glucose-Capillary: 85 mg/dL (ref 70–99)

## 2014-09-03 LAB — BLOOD GAS, ARTERIAL
ACID-BASE DEFICIT: 3.8 mmol/L — AB (ref 0.0–2.0)
Bicarbonate: 18.8 mEq/L — ABNORMAL LOW (ref 20.0–24.0)
FIO2: 0.4 %
LHR: 28 {breaths}/min
O2 SAT: 99 %
PCO2 ART: 23.6 mmHg — AB (ref 35.0–45.0)
PEEP: 5 cmH2O
Patient temperature: 98.6
TCO2: 19.5 mmol/L (ref 0–100)
VT: 400 mL
pH, Arterial: 7.511 — ABNORMAL HIGH (ref 7.350–7.450)
pO2, Arterial: 142 mmHg — ABNORMAL HIGH (ref 80.0–100.0)

## 2014-09-03 LAB — COMPREHENSIVE METABOLIC PANEL
ALK PHOS: 88 U/L (ref 39–117)
ALT: 59 U/L — ABNORMAL HIGH (ref 0–35)
AST: 127 U/L — AB (ref 0–37)
Albumin: 1.6 g/dL — ABNORMAL LOW (ref 3.5–5.2)
Anion gap: 18 — ABNORMAL HIGH (ref 5–15)
BILIRUBIN TOTAL: 0.6 mg/dL (ref 0.3–1.2)
BUN: 12 mg/dL (ref 6–23)
CO2: 18 mEq/L — ABNORMAL LOW (ref 19–32)
CREATININE: 2 mg/dL — AB (ref 0.50–1.10)
Calcium: 7.1 mg/dL — ABNORMAL LOW (ref 8.4–10.5)
Chloride: 92 mEq/L — ABNORMAL LOW (ref 96–112)
GFR calc Af Amer: 38 mL/min — ABNORMAL LOW (ref >90)
GFR calc non Af Amer: 33 mL/min — ABNORMAL LOW (ref >90)
Glucose, Bld: 117 mg/dL — ABNORMAL HIGH (ref 70–99)
POTASSIUM: 4.3 meq/L (ref 3.7–5.3)
Sodium: 128 mEq/L — ABNORMAL LOW (ref 137–147)
Total Protein: 4.4 g/dL — ABNORMAL LOW (ref 6.0–8.3)

## 2014-09-03 LAB — POCT I-STAT 3, ART BLOOD GAS (G3+)
Acid-base deficit: 5 mmol/L — ABNORMAL HIGH (ref 0.0–2.0)
Bicarbonate: 19.9 mEq/L — ABNORMAL LOW (ref 20.0–24.0)
O2 Saturation: 100 %
PCO2 ART: 34.8 mmHg — AB (ref 35.0–45.0)
PO2 ART: 176 mmHg — AB (ref 80.0–100.0)
Patient temperature: 36.9
TCO2: 21 mmol/L (ref 0–100)
pH, Arterial: 7.364 (ref 7.350–7.450)

## 2014-09-03 LAB — BASIC METABOLIC PANEL
Anion gap: 15 (ref 5–15)
BUN: 14 mg/dL (ref 6–23)
CHLORIDE: 93 meq/L — AB (ref 96–112)
CO2: 21 meq/L (ref 19–32)
Calcium: 7.1 mg/dL — ABNORMAL LOW (ref 8.4–10.5)
Creatinine, Ser: 2.13 mg/dL — ABNORMAL HIGH (ref 0.50–1.10)
GFR calc Af Amer: 36 mL/min — ABNORMAL LOW (ref >90)
GFR calc non Af Amer: 31 mL/min — ABNORMAL LOW (ref >90)
Glucose, Bld: 98 mg/dL (ref 70–99)
Potassium: 4 mEq/L (ref 3.7–5.3)
Sodium: 129 mEq/L — ABNORMAL LOW (ref 137–147)

## 2014-09-03 LAB — MAGNESIUM: MAGNESIUM: 2.1 mg/dL (ref 1.5–2.5)

## 2014-09-03 MED ORDER — FENTANYL CITRATE 0.05 MG/ML IJ SOLN
50.0000 ug/h | INTRAMUSCULAR | Status: DC
  Administered 2014-09-04: 25 ug/h via INTRAVENOUS
  Filled 2014-09-03: qty 50

## 2014-09-03 MED ORDER — MIDAZOLAM HCL 2 MG/2ML IJ SOLN
2.0000 mg | INTRAMUSCULAR | Status: DC | PRN
  Administered 2014-09-03 – 2014-09-04 (×2): 2 mg via INTRAVENOUS
  Filled 2014-09-03: qty 2

## 2014-09-03 MED ORDER — MIDAZOLAM HCL 2 MG/2ML IJ SOLN
INTRAMUSCULAR | Status: AC
  Filled 2014-09-03: qty 2

## 2014-09-03 MED ORDER — PROPOFOL 10 MG/ML IV EMUL
INTRAVENOUS | Status: AC
  Filled 2014-09-03: qty 100

## 2014-09-03 MED ORDER — PROPOFOL 10 MG/ML IV EMUL
5.0000 ug/kg/min | INTRAVENOUS | Status: DC
Start: 2014-09-03 — End: 2014-09-06
  Administered 2014-09-03 – 2014-09-04 (×2): 10 ug/kg/min via INTRAVENOUS
  Filled 2014-09-03: qty 100

## 2014-09-03 NOTE — Progress Notes (Signed)
Pt placed on CPAP/PS( 5/10) to wean.  Pt tolerating well. RT will continue to monitor.

## 2014-09-03 NOTE — Progress Notes (Signed)
[facility] Progress Note Patient Name: Julie Saunders DOB: Aug 05, 1986 MRN: [id number]   Date of Service  09/03/2014  HPI/Events of Note  Patient with intermittent agitation resulting in pulling out feeding tube and attempting to pull out other tube.  She is restrained for her safety.  She is on fentanyl drip at 122mcg/hr and intermittent versed also needed.  eICU Interventions  Will restart propofol drip to avoid increasing need for benzo in hopes of possible extubation tomorrow     Intervention Category Minor Interventions: Agitation / anxiety - evaluation and management  Mauri Brooklyn, P 09/03/2014, 5:38 PM

## 2014-09-03 NOTE — Progress Notes (Signed)
PULMONARY / CRITICAL CARE MEDICINE   Name: Julie Saunders MRN: [id number] DOB: 08/15/1986    ADMISSION DATE:  08/31/2014  REFERRING MD :  Dr. Jacelyn Grip   CHIEF COMPLAINT:  Cardiac Arrest    INITIAL PRESENTATION:  28 y/o female admitted 10/22 after VFib arrest at home and prolonged CPR (prolonged down time) when presenting to the ER.  Etiology felt to be hypokalemia from diarrhea.  To ICU for hypothermia protocol.  STUDIES:   10/16  Colonoscopy (Per [facility]) >> displays active duodenitis with areas of villous blunting/atrophy in addition to a small granuloma with multinucleated giant cells as seen in one of the fragments. Given the history of Crohn's disease, the changes are most compatible with involvement by the same disease process 10/22  CT head >> neg 10/22  CT cervical spine >> neg 10/22  ECHO >> RV normal, LVEF 20-25% 10/23 LE Doppler >>neg 10./24 ct>>Bowel wall thickening of terminal ileum and significant portions ofparticularly the distal colon compatible with enteritis, favor Crohn's disease in light of history.<Bibasilar effusions, atelectasis and suspected lower lobe  SIGNIFICANT EVENTS: 10/22  Admit after several days diarrhea, cardiac arrest at home with prolonged CPR 10/24- rewarmed, follows commands 10/25- improved levo major  SUBJECTIVE: pressors down sig  VITAL SIGNS: Temp:  [97.5 F (36.4 C)-99 F (37.2 C)] 98.6 F (37 C) (10/25 0800) Pulse Rate:  [59-88] 88 (10/25 0750) Resp:  [25-30] 28 (10/25 0750) BP: (95-125)/(50-72) 103/55 mmHg (10/25 0750) SpO2:  [70 %-100 %] 98 % (10/25 0750) Arterial Line BP: (83-115)/(44-67) 99/56 mmHg (10/25 0730) FiO2 (%):  [40 %] 40 % (10/25 0800) Weight:  [87.7 kg (193 lb 5.5 oz)] 87.7 kg (193 lb 5.5 oz) (10/25 0321)  HEMODYNAMICS: CVP:  [10 mmHg-15 mmHg] 10 mmHg  VENTILATOR SETTINGS: Vent Mode:  [-] PRVC FiO2 (%):  [40 %] 40 % Set Rate:  [28 bmp] 28 bmp Vt Set:  [420 mL] 420 mL PEEP:  [5 cmH20] 5 cmH20 Plateau Pressure:   [16 cmH20-18 cmH20] 16 cmH20  INTAKE / OUTPUT:  Intake/Output Summary (Last 24 hours) at 09/03/14 0944 Last data filed at 09/03/14 0800  Gross per 24 hour  Intake 4390.67 ml  Output    580 ml  Net 3810.67 ml    PHYSICAL EXAMINATION: General:  No pain, awake follows commands HEENT: no pain, jvd wnl and increased PULM: coarse CV: RRR, no mgr AB: BS wnl, no r/g, soft, pads wnl Ext: good cap refil, color wnl and improved all ext, dopler wnl now on ext Neuro: rass -1, follows commands  LABS:  CBC  Recent Labs Lab 09/02/14 0525 09/02/14 0735 09/03/14 0335  WBC 33.1* 35.8* 34.8*  HGB 14.9 14.8 11.8*  HCT 41.6 40.6 32.5*  PLT 171 172 175   Coag's  Recent Labs Lab 08/31/14 1219 08/31/14 1413 08/31/14 2015  APTT  --  32 33  INR 1.40 1.14 1.12   BMET  Recent Labs Lab 09/02/14 1052 09/02/14 2230 09/03/14 0335  NA 123* 127* 128*  K 4.4 4.2 4.3  CL 95* 92* 92*  CO2 13* 15* 18*  BUN 8 10 12   CREATININE 1.59* 1.90* 2.00*  GLUCOSE 90 102* 117*   Electrolytes  Recent Labs Lab 08/31/14 1219  08/31/14 1413  08/31/14 2121  09/01/14 1000  09/02/14 0525 09/02/14 1052 09/02/14 2230 09/03/14 0335  CALCIUM 7.6*  --   --   < >  --   < > 6.7*  < > 7.1* 7.2* 7.1* 7.1*  MG  --   < > 1.5  --  1.9  --  4.0*  --  2.3  --   --  2.1  PHOS  --   --  3.1  --  2.2*  --   --   --   --   --   --   --   < > = values in this interval not displayed. Sepsis Markers  Recent Labs Lab 08/31/14 1219 08/31/14 1233 09/01/14 1027  LATICACIDVEN 17.2* 15.78* 4.6*   ABG  Recent Labs Lab 09/01/14 0955 09/02/14 1119 09/03/14 0355  PHART 7.269* 7.345* 7.511*  PCO2ART 28.1* 22.8* 23.6*  PO2ART 213.0* 110.0* 142.0*   Liver Enzymes  Recent Labs Lab 09/01/14 0359 09/02/14 0525 09/03/14 0335  AST 62* 69* 127*  ALT 48* 44* 59*  ALKPHOS 97 68 88  BILITOT 0.3 0.4 0.6  ALBUMIN 1.5* 2.1* 1.6*   Cardiac Enzymes  Recent Labs Lab 08/31/14 1900 08/31/14 2328 09/01/14 0420   TROPONINI 1.27* 1.02* 0.97*   Glucose  Recent Labs Lab 09/02/14 1105 09/02/14 1747 09/02/14 2031 09/03/14 0019 09/03/14 0335 09/03/14 0729  GLUCAP 82 114* 124* 129* 120* 85    Imaging 10/24CXR> ETT, cvl noted, diffuse int infiltrate rt greater left  ASSESSMENT / PLAN:  PULMONARY  OETT 10/22 >>  A:   Acute hypoxemic respiratory failure - in the setting of cardiac arrest, likely secondary to QTC from hypoK Bilateral basilar infiltrates / atx P:   abg reviewed, reduce MV now, rate to 12 Wean cpap 5 ps 5, goal 2 hrs, assess rsbi abg to follow pcxr in am  Adjust ET out 1 cm  CARDIOVASCULAR CVL L IJ 10/22 >> A:  VFib arrest - most likely hypokalemia, PE unlikely with normal RV  Shock > cardiogenic likely IS this septic shock? See ID Prolonged QTc > resolved NSTEMI> stunned myocardium from  No evidence of rel AI Reduced EF, septic cardiomyoapthy? takasub? P:  MAP 65 remains with ext hypoperfusion Will need echo repeat in 1 week Will need cards input in am for EF evaluation follow up needed Dc vaso, levo to off as goal today likely lasix when off pressors  RENAL A:    Hypokalemia Hypocalcemia Mixed metabolic acidosis> from diarrhea and lactic acidosis P:   Maintain bicarb for NONAG until co2 to 24 to 50 cc/hr Lasix likely in am   GASTROINTESTINAL A:  Crohns - colonoscopy done 10/16 with multiple bx sites, neg for malignancy.  C/W Active Gastritis.   LLQ ABD Pain - r/o septic source Diarrhea  Elevated Ammonia resolved, due to shock Protein Calorie Malnutrition admission albumine 1.8 P:   GI prophylaxis with IV pepcid Hold home medications: pentasa, imodium Re attempt TF at 10 cc/hr May need GI consult for treatment chrons flare, will call Will consider steroids  HEMATOLOGIC A:   dvt prevention, leukocytosis P:  Monitor CBC in am   DVT: SCD's + heparin sq LE doppler neg  INFECTIOUS A:   Diarrhea - Crohns vs enterocolitis R/o abdo source  (remains in shock, leukocytosis) P:   BCx2 10/22 >> Stool Culture 10/22 >>  Abx: Zosyn 10/22>>> vanc 10/24>> casp 10/24>>>  Dc caspo as neg perf abscess CT CT reviewed  ENDOCRINE A:   Hyperglycemia - likely stress response, no hx of DM P:   ssi No role stress steroids  NEUROLOGIC A:   Acute anoxic encephalopathy - post arrest  Possible seizure activity and decorticate posturing prior to hypothermia Improved neuro  Neck cleared clinically and by CT P:   RASS goal: 0 fent wua   GLOBAL:    - Updates: Family updated at bedside 10/24, 10/25   TODAY'S SUMMARY: WBC remains up, GI consult to treat chron flare, weaning, lasix in am, dc pressors in full as able  I have personally obtained a history, examined the patient, evaluated laboratory and imaging results, formulated the assessment and plan and placed orders. CRITICAL CARE: The patient is critically ill with multiple organ systems failure and requires high complexity decision making for assessment and support, frequent evaluation and titration of therapies, application of advanced monitoring technologies and extensive interpretation of multiple databases. Critical Care Time devoted to patient care services described in this note is 30 minutes.   Lavon Paganini. Titus Mould, MD, [place] Pgr: [facility]

## 2014-09-03 NOTE — Consult Note (Signed)
Reason for Consult: Crohn's disease Referring Physician: ICU team  Julie Saunders is an 28 y.o. female.  HPI: Patient known to my partner Dr. Bosie Clos with history of Crohn's disease and recent colonoscopy and endoscopy in the office on October 16 with biopsy results on this hospital computer and only very minimal signs of active Crohn's disease who had the unfortunate arrest and is now intubated in the ICU and is not yet answering questions and her CT shows mild Crohn's disease as well and we are consulted to assist with management as she improves and is weaning from the ventilator and she was on just Pentasa as an outpatient according to our office chart  Past Medical History  Diagnosis Date  . Crohn disease   . Migraine   . Allergy   . Pancytopenia 11/23/2012    History reviewed. No pertinent past surgical history.  Family History  Problem Relation Age of Onset  . Heart attack Mother     Social History:  reports that she has never smoked. She does not have any smokeless tobacco history on file. She reports that she drinks about .5 ounces of alcohol per week. She reports that she does not use illicit drugs.  Allergies:  Allergies  Allergen Reactions  . Sulfa Antibiotics     Medications: I have reviewed the patient's current medications.  Results for orders placed during the hospital encounter of 08/31/14 (from the past 48 hour(s))  GLUCOSE, CAPILLARY     Status: Abnormal   Collection Time    09/01/14  1:11 PM      Result Value Ref Range   Glucose-Capillary 168 (*) 70 - 99 mg/dL   Comment 1 Arterial Sample    GLUCOSE, CAPILLARY     Status: Abnormal   Collection Time    09/01/14  2:04 PM      Result Value Ref Range   Glucose-Capillary 194 (*) 70 - 99 mg/dL   Comment 1 Arterial Sample    BASIC METABOLIC PANEL     Status: Abnormal   Collection Time    09/01/14  2:05 PM      Result Value Ref Range   Sodium 129 (*) 137 - 147 mEq/L   Potassium 4.7  3.7 - 5.3 mEq/L    Comment: DELTA CHECK NOTED   Chloride 100  96 - 112 mEq/L   CO2 11 (*) 19 - 32 mEq/L   Glucose, Bld 202 (*) 70 - 99 mg/dL   BUN 7  6 - 23 mg/dL   Creatinine, Ser 8.41 (*) 0.50 - 1.10 mg/dL   Calcium 7.1 (*) 8.4 - 10.5 mg/dL   GFR calc non Af Amer 64 (*) >90 mL/min   GFR calc Af Amer 74 (*) >90 mL/min   Comment: (NOTE)     The eGFR has been calculated using the CKD EPI equation.     This calculation has not been validated in all clinical situations.     eGFR's persistently <90 mL/min signify possible Chronic Kidney     Disease.   Anion gap 18 (*) 5 - 15  GLUCOSE, CAPILLARY     Status: Abnormal   Collection Time    09/01/14  3:02 PM      Result Value Ref Range   Glucose-Capillary 156 (*) 70 - 99 mg/dL  GLUCOSE, CAPILLARY     Status: Abnormal   Collection Time    09/01/14  3:59 PM      Result Value Ref Range   Glucose-Capillary  138 (*) 70 - 99 mg/dL   Comment 1 Arterial Sample    GLUCOSE, CAPILLARY     Status: Abnormal   Collection Time    09/01/14  4:54 PM      Result Value Ref Range   Glucose-Capillary 109 (*) 70 - 99 mg/dL  BASIC METABOLIC PANEL     Status: Abnormal   Collection Time    09/01/14  5:35 PM      Result Value Ref Range   Sodium 130 (*) 137 - 147 mEq/L   Potassium 3.5 (*) 3.7 - 5.3 mEq/L   Chloride 102  96 - 112 mEq/L   CO2 11 (*) 19 - 32 mEq/L   Glucose, Bld 135 (*) 70 - 99 mg/dL   BUN 7  6 - 23 mg/dL   Creatinine, Ser 1.26 (*) 0.50 - 1.10 mg/dL   Calcium 6.9 (*) 8.4 - 10.5 mg/dL   GFR calc non Af Amer 58 (*) >90 mL/min   GFR calc Af Amer 67 (*) >90 mL/min   Comment: (NOTE)     The eGFR has been calculated using the CKD EPI equation.     This calculation has not been validated in all clinical situations.     eGFR's persistently <90 mL/min signify possible Chronic Kidney     Disease.   Anion gap 17 (*) 5 - 15  GLUCOSE, CAPILLARY     Status: Abnormal   Collection Time    09/01/14  5:53 PM      Result Value Ref Range   Glucose-Capillary 132 (*) 70 - 99  mg/dL  GLUCOSE, CAPILLARY     Status: Abnormal   Collection Time    09/01/14  6:54 PM      Result Value Ref Range   Glucose-Capillary 121 (*) 70 - 99 mg/dL  GLUCOSE, CAPILLARY     Status: Abnormal   Collection Time    09/01/14  8:00 PM      Result Value Ref Range   Glucose-Capillary 127 (*) 70 - 99 mg/dL  BASIC METABOLIC PANEL     Status: Abnormal   Collection Time    09/01/14  9:48 PM      Result Value Ref Range   Sodium 129 (*) 137 - 147 mEq/L   Potassium 3.5 (*) 3.7 - 5.3 mEq/L   Chloride 101  96 - 112 mEq/L   CO2 12 (*) 19 - 32 mEq/L   Glucose, Bld 107 (*) 70 - 99 mg/dL   BUN 7  6 - 23 mg/dL   Creatinine, Ser 1.28 (*) 0.50 - 1.10 mg/dL   Calcium 6.9 (*) 8.4 - 10.5 mg/dL   GFR calc non Af Amer 57 (*) >90 mL/min   GFR calc Af Amer 66 (*) >90 mL/min   Comment: (NOTE)     The eGFR has been calculated using the CKD EPI equation.     This calculation has not been validated in all clinical situations.     eGFR's persistently <90 mL/min signify possible Chronic Kidney     Disease.   Anion gap 16 (*) 5 - 15  GLUCOSE, CAPILLARY     Status: None   Collection Time    09/02/14 12:06 AM      Result Value Ref Range   Glucose-Capillary 96  70 - 99 mg/dL   Comment 1 Arterial Sample    BASIC METABOLIC PANEL     Status: Abnormal   Collection Time    09/02/14  1:57 AM  Result Value Ref Range   Sodium 128 (*) 137 - 147 mEq/L   Potassium 3.3 (*) 3.7 - 5.3 mEq/L   Chloride 100  96 - 112 mEq/L   CO2 14 (*) 19 - 32 mEq/L   Glucose, Bld 98  70 - 99 mg/dL   BUN 8  6 - 23 mg/dL   Creatinine, Ser 1.37 (*) 0.50 - 1.10 mg/dL   Calcium 7.0 (*) 8.4 - 10.5 mg/dL   GFR calc non Af Amer 52 (*) >90 mL/min   GFR calc Af Amer 61 (*) >90 mL/min   Comment: (NOTE)     The eGFR has been calculated using the CKD EPI equation.     This calculation has not been validated in all clinical situations.     eGFR's persistently <90 mL/min signify possible Chronic Kidney     Disease.   Anion gap 14  5 -  15  GLUCOSE, CAPILLARY     Status: None   Collection Time    09/02/14  4:07 AM      Result Value Ref Range   Glucose-Capillary 85  70 - 99 mg/dL   Comment 1 Arterial Sample    CBC WITH DIFFERENTIAL     Status: Abnormal   Collection Time    09/02/14  5:25 AM      Result Value Ref Range   WBC 33.1 (*) 4.0 - 10.5 K/uL   RBC 4.94  3.87 - 5.11 MIL/uL   Hemoglobin 14.9  12.0 - 15.0 g/dL   HCT 41.6  36.0 - 46.0 %   MCV 84.2  78.0 - 100.0 fL   Comment: VERIFIED WITH RECOLLECT   MCH 30.2  26.0 - 34.0 pg   MCHC 35.8  30.0 - 36.0 g/dL   RDW 13.5  11.5 - 15.5 %   Platelets 171  150 - 400 K/uL   Neutrophils Relative % 88 (*) 43 - 77 %   Lymphocytes Relative 6 (*) 12 - 46 %   Monocytes Relative 6  3 - 12 %   Eosinophils Relative 0  0 - 5 %   Basophils Relative 0  0 - 1 %   Neutro Abs 29.1 (*) 1.7 - 7.7 K/uL   Lymphs Abs 2.0  0.7 - 4.0 K/uL   Monocytes Absolute 2.0 (*) 0.1 - 1.0 K/uL   Eosinophils Absolute 0.0  0.0 - 0.7 K/uL   Basophils Absolute 0.0  0.0 - 0.1 K/uL   RBC Morphology BURR CELLS     Comment: POLYCHROMASIA PRESENT   WBC Morphology INCREASED BANDS (>20% BANDS)     Smear Review LARGE PLATELETS PRESENT    MAGNESIUM     Status: None   Collection Time    09/02/14  5:25 AM      Result Value Ref Range   Magnesium 2.3  1.5 - 2.5 mg/dL  HEPATIC FUNCTION PANEL     Status: Abnormal   Collection Time    09/02/14  5:25 AM      Result Value Ref Range   Total Protein 5.1 (*) 6.0 - 8.3 g/dL   Albumin 2.1 (*) 3.5 - 5.2 g/dL   AST 69 (*) 0 - 37 U/L   ALT 44 (*) 0 - 35 U/L   Alkaline Phosphatase 68  39 - 117 U/L   Total Bilirubin 0.4  0.3 - 1.2 mg/dL   Bilirubin, Direct <0.2  0.0 - 0.3 mg/dL   Indirect Bilirubin NOT CALCULATED  0.3 - 0.9 mg/dL  BASIC METABOLIC PANEL     Status: Abnormal   Collection Time    09/02/14  5:25 AM      Result Value Ref Range   Sodium 127 (*) 137 - 147 mEq/L   Potassium 4.4  3.7 - 5.3 mEq/L   Comment: DELTA CHECK NOTED   Chloride 98  96 - 112 mEq/L    CO2 13 (*) 19 - 32 mEq/L   Glucose, Bld 87  70 - 99 mg/dL   BUN 8  6 - 23 mg/dL   Creatinine, Ser 1.43 (*) 0.50 - 1.10 mg/dL   Calcium 7.1 (*) 8.4 - 10.5 mg/dL   GFR calc non Af Amer 50 (*) >90 mL/min   GFR calc Af Amer 58 (*) >90 mL/min   Comment: (NOTE)     The eGFR has been calculated using the CKD EPI equation.     This calculation has not been validated in all clinical situations.     eGFR's persistently <90 mL/min signify possible Chronic Kidney     Disease.   Anion gap 16 (*) 5 - 15  GLUCOSE, CAPILLARY     Status: None   Collection Time    09/02/14  7:29 AM      Result Value Ref Range   Glucose-Capillary 82  70 - 99 mg/dL   Comment 1 Arterial Sample    CBC     Status: Abnormal   Collection Time    09/02/14  7:35 AM      Result Value Ref Range   WBC 35.8 (*) 4.0 - 10.5 K/uL   RBC 4.83  3.87 - 5.11 MIL/uL   Hemoglobin 14.8  12.0 - 15.0 g/dL   HCT 40.6  36.0 - 46.0 %   MCV 84.1  78.0 - 100.0 fL   Comment: RESULTS VERIFIED VIA RECOLLECT   MCH 30.6  26.0 - 34.0 pg   MCHC 36.5 (*) 30.0 - 36.0 g/dL   RDW 13.4  11.5 - 15.5 %   Platelets 172  150 - 400 K/uL  CORTISOL     Status: None   Collection Time    09/02/14 10:52 AM      Result Value Ref Range   Cortisol, Plasma 110.7     Comment: (NOTE)     Result confirmed by automatic dilution.     AM:  4.3 - 22.4 ug/dL     PM:  3.1 - 16.7 ug/dL     Performed at [place]     Status: Abnormal   Collection Time    09/02/14 10:52 AM      Result Value Ref Range   Sodium 123 (*) 137 - 147 mEq/L   Potassium 4.4  3.7 - 5.3 mEq/L   Chloride 95 (*) 96 - 112 mEq/L   CO2 13 (*) 19 - 32 mEq/L   Glucose, Bld 90  70 - 99 mg/dL   BUN 8  6 - 23 mg/dL   Creatinine, Ser 1.59 (*) 0.50 - 1.10 mg/dL   Calcium 7.2 (*) 8.4 - 10.5 mg/dL   GFR calc non Af Amer 44 (*) >90 mL/min   GFR calc Af Amer 51 (*) >90 mL/min   Comment: (NOTE)     The eGFR has been calculated using the CKD EPI equation.     This  calculation has not been validated in all clinical situations.     eGFR's persistently <90 mL/min signify possible Chronic Kidney  Disease.   Anion gap 15  5 - 15  GLUCOSE, CAPILLARY     Status: None   Collection Time    09/02/14 11:05 AM      Result Value Ref Range   Glucose-Capillary 82  70 - 99 mg/dL   Comment 1 Arterial Sample    POCT I-STAT 3, ART BLOOD GAS (G3+)     Status: Abnormal   Collection Time    09/02/14 11:19 AM      Result Value Ref Range   pH, Arterial 7.345 (*) 7.350 - 7.450   pCO2 arterial 22.8 (*) 35.0 - 45.0 mmHg   pO2, Arterial 110.0 (*) 80.0 - 100.0 mmHg   Bicarbonate 12.4 (*) 20.0 - 24.0 mEq/L   TCO2 13  0 - 100 mmol/L   O2 Saturation 98.0     Acid-base deficit 11.0 (*) 0.0 - 2.0 mmol/L   Patient temperature 37.2 C     Collection site RADIAL, ALLEN'S TEST ACCEPTABLE     Drawn by VENIPUNCTURE     Sample type ARTERIAL    GLUCOSE, CAPILLARY     Status: Abnormal   Collection Time    09/02/14  5:47 PM      Result Value Ref Range   Glucose-Capillary 114 (*) 70 - 99 mg/dL   Comment 1 Arterial Sample    GLUCOSE, CAPILLARY     Status: Abnormal   Collection Time    09/02/14  8:31 PM      Result Value Ref Range   Glucose-Capillary 124 (*) 70 - 99 mg/dL  BASIC METABOLIC PANEL     Status: Abnormal   Collection Time    09/02/14 10:30 PM      Result Value Ref Range   Sodium 127 (*) 137 - 147 mEq/L   Potassium 4.2  3.7 - 5.3 mEq/L   Chloride 92 (*) 96 - 112 mEq/L   CO2 15 (*) 19 - 32 mEq/L   Glucose, Bld 102 (*) 70 - 99 mg/dL   BUN 10  6 - 23 mg/dL   Creatinine, Ser 1.90 (*) 0.50 - 1.10 mg/dL   Calcium 7.1 (*) 8.4 - 10.5 mg/dL   GFR calc non Af Amer 35 (*) >90 mL/min   GFR calc Af Amer 41 (*) >90 mL/min   Comment: (NOTE)     The eGFR has been calculated using the CKD EPI equation.     This calculation has not been validated in all clinical situations.     eGFR's persistently <90 mL/min signify possible Chronic Kidney     Disease.   Anion gap 20 (*) 5  - 15  GLUCOSE, CAPILLARY     Status: Abnormal   Collection Time    09/03/14 12:19 AM      Result Value Ref Range   Glucose-Capillary 129 (*) 70 - 99 mg/dL  MAGNESIUM     Status: None   Collection Time    09/03/14  3:35 AM      Result Value Ref Range   Magnesium 2.1  1.5 - 2.5 mg/dL  COMPREHENSIVE METABOLIC PANEL     Status: Abnormal   Collection Time    09/03/14  3:35 AM      Result Value Ref Range   Sodium 128 (*) 137 - 147 mEq/L   Potassium 4.3  3.7 - 5.3 mEq/L   Chloride 92 (*) 96 - 112 mEq/L   CO2 18 (*) 19 - 32 mEq/L   Glucose, Bld 117 (*) 70 - 99  mg/dL   BUN 12  6 - 23 mg/dL   Creatinine, Ser 2.00 (*) 0.50 - 1.10 mg/dL   Calcium 7.1 (*) 8.4 - 10.5 mg/dL   Total Protein 4.4 (*) 6.0 - 8.3 g/dL   Albumin 1.6 (*) 3.5 - 5.2 g/dL   AST 127 (*) 0 - 37 U/L   ALT 59 (*) 0 - 35 U/L   Alkaline Phosphatase 88  39 - 117 U/L   Total Bilirubin 0.6  0.3 - 1.2 mg/dL   GFR calc non Af Amer 33 (*) >90 mL/min   GFR calc Af Amer 38 (*) >90 mL/min   Comment: (NOTE)     The eGFR has been calculated using the CKD EPI equation.     This calculation has not been validated in all clinical situations.     eGFR's persistently <90 mL/min signify possible Chronic Kidney     Disease.   Anion gap 18 (*) 5 - 15  CBC WITH DIFFERENTIAL     Status: Abnormal   Collection Time    09/03/14  3:35 AM      Result Value Ref Range   WBC 34.8 (*) 4.0 - 10.5 K/uL   RBC 3.97  3.87 - 5.11 MIL/uL   Hemoglobin 11.8 (*) 12.0 - 15.0 g/dL   HCT 32.5 (*) 36.0 - 46.0 %   MCV 81.9  78.0 - 100.0 fL   MCH 29.7  26.0 - 34.0 pg   MCHC 36.3 (*) 30.0 - 36.0 g/dL   RDW 13.8  11.5 - 15.5 %   Platelets 175  150 - 400 K/uL   Neutrophils Relative % 90 (*) 43 - 77 %   Neutro Abs 31.3 (*) 1.7 - 7.7 K/uL   Lymphocytes Relative 4 (*) 12 - 46 %   Lymphs Abs 1.4  0.7 - 4.0 K/uL   Monocytes Relative 6  3 - 12 %   Monocytes Absolute 2.0 (*) 0.1 - 1.0 K/uL   Eosinophils Relative 0  0 - 5 %   Eosinophils Absolute 0.0  0.0 - 0.7  K/uL   Basophils Relative 0  0 - 1 %   Basophils Absolute 0.0  0.0 - 0.1 K/uL  GLUCOSE, CAPILLARY     Status: Abnormal   Collection Time    09/03/14  3:35 AM      Result Value Ref Range   Glucose-Capillary 120 (*) 70 - 99 mg/dL  BLOOD GAS, ARTERIAL     Status: Abnormal   Collection Time    09/03/14  3:55 AM      Result Value Ref Range   FIO2 0.40     Delivery systems VENTILATOR     Mode PRESSURE REGULATED VOLUME CONTROL     VT 400     Rate 28     Peep/cpap 5.0     pH, Arterial 7.511 (*) 7.350 - 7.450   pCO2 arterial 23.6 (*) 35.0 - 45.0 mmHg   pO2, Arterial 142.0 (*) 80.0 - 100.0 mmHg   Bicarbonate 18.8 (*) 20.0 - 24.0 mEq/L   TCO2 19.5  0 - 100 mmol/L   Acid-base deficit 3.8 (*) 0.0 - 2.0 mmol/L   O2 Saturation 99.0     Patient temperature 98.6     Collection site A-LINE     Drawn by RN     Sample type ARTERIAL DRAW    GLUCOSE, CAPILLARY     Status: None   Collection Time    09/03/14  7:29 AM  Result Value Ref Range   Glucose-Capillary 85  70 - 99 mg/dL   Comment 1 Capillary Sample    BASIC METABOLIC PANEL     Status: Abnormal   Collection Time    09/03/14 10:35 AM      Result Value Ref Range   Sodium 129 (*) 137 - 147 mEq/L   Potassium 4.0  3.7 - 5.3 mEq/L   Chloride 93 (*) 96 - 112 mEq/L   CO2 21  19 - 32 mEq/L   Glucose, Bld 98  70 - 99 mg/dL   BUN 14  6 - 23 mg/dL   Creatinine, Ser 2.13 (*) 0.50 - 1.10 mg/dL   Calcium 7.1 (*) 8.4 - 10.5 mg/dL   GFR calc non Af Amer 31 (*) >90 mL/min   GFR calc Af Amer 36 (*) >90 mL/min   Comment: (NOTE)     The eGFR has been calculated using the CKD EPI equation.     This calculation has not been validated in all clinical situations.     eGFR's persistently <90 mL/min signify possible Chronic Kidney     Disease.   Anion gap 15  5 - 15  POCT I-STAT 3, ART BLOOD GAS (G3+)     Status: Abnormal   Collection Time    09/03/14 12:06 PM      Result Value Ref Range   pH, Arterial 7.364  7.350 - 7.450   pCO2 arterial 34.8  (*) 35.0 - 45.0 mmHg   pO2, Arterial 176.0 (*) 80.0 - 100.0 mmHg   Bicarbonate 19.9 (*) 20.0 - 24.0 mEq/L   TCO2 21  0 - 100 mmol/L   O2 Saturation 100.0     Acid-base deficit 5.0 (*) 0.0 - 2.0 mmol/L   Patient temperature 36.9 C     Collection site RADIAL, ALLEN'S TEST ACCEPTABLE     Drawn by VENIPUNCTURE     Sample type ARTERIAL      Ct Abdomen Pelvis W Contrast  09/02/2014   CLINICAL DATA:  Patient with Crohn's disease and acute sepsis, cardiac arrest, high suspicion of intra-abdominal abscess due to Crohn's and sepsis, impaired renal function  EXAM: CT ABDOMEN AND PELVIS WITH CONTRAST  TECHNIQUE: Multidetector CT imaging of the abdomen and pelvis was performed using the standard protocol following bolus administration of intravenous contrast. Sagittal and coronal MPR images reconstructed from axial data set.  CONTRAST:  17mL OMNIPAQUE IOHEXOL 300 MG/ML SOLN. Dilute oral contrast.  COMPARISON:  None  FINDINGS: BILATERAL pleural effusions and basilar volume loss with consolidation in both lower lobes.  Diffuse fatty infiltration of liver.  Scattered beam hardening artifacts from inclusion of patient's arms in imaged field.  Liver, spleen, pancreas, kidneys, and adrenal glands otherwise normal.  Diffuse wall thickening of the terminal ileum and multiple sites of the colon consistent with history of Crohn's disease.  Air and Foley catheter within decompressed urinary bladder.  Unremarkable uterus and adnexae for age.  Significant free fluid in abdomen and pelvis including perihepatic and perisplenic.  No definite free intraperitoneal air identified however.  Rectal tube and Foley catheter present.  No mass, adenopathy, or hernia.  No acute osseous findings.  Gallbladder appears distended without calcification.  Stomach and remaining bowel loops unremarkable.  Scattered subcutaneous edema.  IMPRESSION: Bowel wall thickening of terminal ileum and significant portions of particularly the distal colon  compatible with enteritis, favor Crohn's disease in light of history.  Bibasilar effusions, atelectasis and suspected lower lobe consolidation.  Associated significant  ascites but no discrete abscess collection or free air identified.  Scattered subcutaneous edema and soft tissue edema which may be due to hypoproteinemia, anasarca or fluid overload.   Electronically Signed   By: Lavonia Dana M.D.   On: 09/02/2014 14:38   Dg Chest Port 1 View  09/03/2014   CLINICAL DATA:  28 year old female with acute respiratory failure.  EXAM: PORTABLE CHEST - 1 VIEW  COMPARISON:  Chest x-ray 09/02/2014.  FINDINGS: An endotracheal tube is in place with tip 2.3 cm above the carina. There is a left-sided internal jugular central venous catheter with tip terminating in the right atrium approximately 1.5 cm distal to the superior cavoatrial junction. A nasogastric tube is seen extending into the stomach, however, the tip of the nasogastric tube extends below the lower margin of the image. Transcutaneous defibrillator pads project over the lower thorax and upper abdomen on the left. Lung volumes are low. Patchy bibasilar opacities are noted, compatible with a combination of atelectasis and/or consolidation. Small bilateral pleural effusions. There is a lucency and peripheral linear opacity in the lateral aspect of the right mid to lower lung, favored to represent a skin fold. Heart size is normal. The patient is rotated to the left on today's exam, resulting in distortion of the mediastinal contours and reduced diagnostic sensitivity and specificity for mediastinal pathology.  IMPRESSION: 1. Support apparatus, as above. 2. Persistent multifocal patchy opacities throughout the mid to lower lungs bilaterally, compatible with a combination of atelectasis and/or consolidation, with superimposed small bilateral pleural effusions. 3. New low vertically oriented lucency and opacity in the periphery of the right lung compatible with a skin  fold artifact. This is present in a different configuration on the subsequent follow-up chest x-ray obtained at 6:27 a.m.   Electronically Signed   By: Vinnie Langton M.D.   On: 09/03/2014 07:21   Dg Chest Port 1 View  09/03/2014   CLINICAL DATA:  Adjustment of endotracheal tube.  EXAM: PORTABLE CHEST - 1 VIEW  COMPARISON:  09/03/2014 at 4:19 a.m.  FINDINGS: Endotracheal tube tip projects 1 cm above the carina. This is without significant change from the earlier exam.  Left internal jugular central venous line tip lies at the caval atrial junction, stable. Nasogastric tube passes below the diaphragm into the stomach.  Cardiac silhouette is normal in size. Normal mediastinal and hilar contours.  Lung base opacity, most evident on the left, may all be atelectasis. Pneumonia is possible. Hazy airspace opacity noted on the previous day's study, most evident in the right upper lobe, has mildly improved. This suggest improved pulmonary edema. No pneumothorax.  IMPRESSION: 1. Endotracheal tube is well positioned. Tip projects 1 cm above the carina. Remaining support apparatus is stable and well positioned. 2. Remaining support apparatus is also well positioned. 3. Left lung base opacity stable from the previous day's exam, likely atelectasis. Pneumonia is possible. Other areas of lung opacity that is likely due to asymmetric edema mildly improved.   Electronically Signed   By: Lajean Manes M.D.   On: 09/03/2014 07:10   Dg Chest Port 1 View  09/02/2014   CLINICAL DATA:  Acute respiratory failure.  Hypoxemia.  EXAM: PORTABLE CHEST - 1 VIEW  COMPARISON:  08/31/2014.  FINDINGS: Endotracheal tube tip now projects at the origin of the right mainstem bronchus, within the right margin of the carina. Recommend retracting 1-2 cm for more optimal positioning.  Nasogastric tube extends below the diaphragm into the stomach. Right internal jugular central  venous line tip lies just above the caval atrial junction.  Right upper  lobe airspace opacity is less dense than on the prior study. Left upper lobe airspace opacity seen previously has mostly resolved. There is also perihilar and lower lung zone airspace opacity, greatest at the left lung base, increased from the prior exam. Lung findings may reflect pulmonary edema with an altered pattern secondary to differences in patient positioning. This is favored. Multifocal pneumonia is felt less likely.  Cardiac silhouette is normal in size. No mediastinal or hilar masses. No pneumothorax.  IMPRESSION: 1. Endotracheal tube tip now lies at the origin the right mainstem bronchus. It does not fully into the right mainstem bronchus. Recommend retracting 1-2 cm. Critical Value/emergent results were called by telephone at the time of interpretation on 09/02/2014 at 8:31 am to this patient's nurse, who verbally acknowledged these results. 2. Changing pattern of lung infiltrates as detailed above. This supports pulmonary edema as the etiology of the pulmonary infiltrates. Upper lung airspace opacities have improved, while there are new lower lung zone airspace opacities.   Electronically Signed   By: Lajean Manes M.D.   On: 09/02/2014 08:32    ROS unable to obtain no family available in the room Blood pressure 93/51, pulse 77, temperature 98.6 F (37 C), temperature source Core (Comment), resp. rate 14, height _0  (1.676 m), weight 87.7 kg (193 lb 5.5 oz), SpO2 99.00%. Physical Exam seemingly stable overall heavily sedated abdomen is soft no obvious tenderness office computer and hospital computer reviewed  Assessment/Plan: Crohn's disease Plan: When okay from an ICU standpoint would restart her Pentasa orally and if significant Crohn's symptoms when she resumes feeding would use steroids first and could use Solu-Medrol now if signs of Crohn's disease which I cannot tell if she has currently based on her current condition and we will follow with you  [facility] E 09/03/2014, 12:42 PM

## 2014-09-03 NOTE — Progress Notes (Signed)
Patient attempted to self-extubate.  ET tube was pulled to 23 at the lip.  RT advanced tube back to 26 at teeth.  BBS showed good air movement and bilateral chest rise.  CXR ordered to check for correct placement. Patient SPO2 at 100% with tidal volumes of 4.3 L. RT will continue to monitor.

## 2014-09-03 NOTE — Progress Notes (Addendum)
Pt became agitated on 50 mcg fent during vent weaning pulling out foley catheter and left AC IV with safety mitts on. Rass +2, HR 120's. Gradually increased Fent to 150 mcg until patient seemed comfortable. Replaced foley catheter per protocol with Devoria Glassing, RN and Coralie Keens, RN. Pt tolerated procedure. Pt resting now and vent turned back to full support. Educated pt's father at bedside.  RN will continue to monitor.

## 2014-09-04 ENCOUNTER — Inpatient Hospital Stay (HOSPITAL_COMMUNITY): Payer: 59

## 2014-09-04 DIAGNOSIS — Z789 Other specified health status: Secondary | ICD-10-CM

## 2014-09-04 DIAGNOSIS — I059 Rheumatic mitral valve disease, unspecified: Secondary | ICD-10-CM

## 2014-09-04 LAB — GLUCOSE, CAPILLARY
GLUCOSE-CAPILLARY: 93 mg/dL (ref 70–99)
GLUCOSE-CAPILLARY: 93 mg/dL (ref 70–99)
GLUCOSE-CAPILLARY: 87 mg/dL (ref 70–99)
Glucose-Capillary: 70 mg/dL (ref 70–99)
Glucose-Capillary: 66 mg/dL — ABNORMAL LOW (ref 70–99)
Glucose-Capillary: 94 mg/dL (ref 70–99)
Glucose-Capillary: 90 mg/dL (ref 70–99)

## 2014-09-04 LAB — CBC WITH DIFFERENTIAL/PLATELET
BASOS ABS: 0 10*3/uL (ref 0.0–0.1)
BASOS ABS: 0 10*3/uL (ref 0.0–0.1)
Basophils Relative: 0 % (ref 0–1)
Basophils Relative: 0 % (ref 0–1)
EOS PCT: 0 % (ref 0–5)
EOS PCT: 0 % (ref 0–5)
Eosinophils Absolute: 0 10*3/uL (ref 0.0–0.7)
Eosinophils Absolute: 0 10*3/uL (ref 0.0–0.7)
HCT: 32.5 % — ABNORMAL LOW (ref 36.0–46.0)
HCT: 28.4 % — ABNORMAL LOW (ref 36.0–46.0)
Hemoglobin: 11.8 g/dL — ABNORMAL LOW (ref 12.0–15.0)
Hemoglobin: 10 g/dL — ABNORMAL LOW (ref 12.0–15.0)
LYMPHS ABS: 2.8 10*3/uL (ref 0.7–4.0)
LYMPHS PCT: 10 % — AB (ref 12–46)
Lymphocytes Relative: 4 % — ABNORMAL LOW (ref 12–46)
Lymphs Abs: 1.4 10*3/uL (ref 0.7–4.0)
MCH: 29.7 pg (ref 26.0–34.0)
MCH: 29.9 pg (ref 26.0–34.0)
MCHC: 36.3 g/dL — ABNORMAL HIGH (ref 30.0–36.0)
MCHC: 35.2 g/dL (ref 30.0–36.0)
MCV: 81.9 fL (ref 78.0–100.0)
MCV: 84.8 fL (ref 78.0–100.0)
MONOS PCT: 6 % (ref 3–12)
Monocytes Absolute: 2 10*3/uL — ABNORMAL HIGH (ref 0.1–1.0)
Monocytes Absolute: 1.7 10*3/uL — ABNORMAL HIGH (ref 0.1–1.0)
Monocytes Relative: 6 % (ref 3–12)
NEUTROS PCT: 84 % — AB (ref 43–77)
Neutro Abs: 31.3 10*3/uL — ABNORMAL HIGH (ref 1.7–7.7)
Neutro Abs: 23.4 10*3/uL — ABNORMAL HIGH (ref 1.7–7.7)
Neutrophils Relative %: 90 % — ABNORMAL HIGH (ref 43–77)
PLATELETS: 152 10*3/uL (ref 150–400)
Platelets: 175 10*3/uL (ref 150–400)
RBC: 3.97 MIL/uL (ref 3.87–5.11)
RBC: 3.35 MIL/uL — AB (ref 3.87–5.11)
RDW: 13.8 % (ref 11.5–15.5)
RDW: 14.5 % (ref 11.5–15.5)
WBC: 34.8 10*3/uL — AB (ref 4.0–10.5)
WBC: 27.9 10*3/uL — AB (ref 4.0–10.5)

## 2014-09-04 LAB — BASIC METABOLIC PANEL
Anion gap: 13 (ref 5–15)
BUN: 19 mg/dL (ref 6–23)
CHLORIDE: 92 meq/L — AB (ref 96–112)
CO2: 24 mEq/L (ref 19–32)
CREATININE: 2.44 mg/dL — AB (ref 0.50–1.10)
Calcium: 7.7 mg/dL — ABNORMAL LOW (ref 8.4–10.5)
GFR calc Af Amer: 30 mL/min — ABNORMAL LOW (ref >90)
GFR calc non Af Amer: 26 mL/min — ABNORMAL LOW (ref >90)
GLUCOSE: 92 mg/dL (ref 70–99)
Potassium: 3.7 mEq/L (ref 3.7–5.3)
Sodium: 129 mEq/L — ABNORMAL LOW (ref 137–147)

## 2014-09-04 LAB — COMPREHENSIVE METABOLIC PANEL
ALT: 97 U/L — ABNORMAL HIGH (ref 0–35)
ANION GAP: 11 (ref 5–15)
AST: 211 U/L — AB (ref 0–37)
Albumin: 1.5 g/dL — ABNORMAL LOW (ref 3.5–5.2)
Alkaline Phosphatase: 66 U/L (ref 39–117)
BUN: 20 mg/dL (ref 6–23)
CALCIUM: 7.8 mg/dL — AB (ref 8.4–10.5)
CO2: 25 meq/L (ref 19–32)
CREATININE: 2.49 mg/dL — AB (ref 0.50–1.10)
Chloride: 93 mEq/L — ABNORMAL LOW (ref 96–112)
GFR calc Af Amer: 29 mL/min — ABNORMAL LOW (ref >90)
GFR, EST NON AFRICAN AMERICAN: 25 mL/min — AB (ref >90)
Glucose, Bld: 91 mg/dL (ref 70–99)
Potassium: 3.2 mEq/L — ABNORMAL LOW (ref 3.7–5.3)
Sodium: 129 mEq/L — ABNORMAL LOW (ref 137–147)
Total Bilirubin: 0.5 mg/dL (ref 0.3–1.2)
Total Protein: 4.2 g/dL — ABNORMAL LOW (ref 6.0–8.3)

## 2014-09-04 LAB — STOOL CULTURE

## 2014-09-04 LAB — VANCOMYCIN, TROUGH: Vancomycin Tr: 38.2 ug/mL (ref 10.0–20.0)

## 2014-09-04 LAB — MAGNESIUM: MAGNESIUM: 2.2 mg/dL (ref 1.5–2.5)

## 2014-09-04 MED ORDER — FUROSEMIDE 10 MG/ML IJ SOLN
40.0000 mg | Freq: Three times a day (TID) | INTRAMUSCULAR | Status: AC
  Administered 2014-09-04 (×2): 40 mg via INTRAVENOUS
  Filled 2014-09-04 (×2): qty 4

## 2014-09-04 MED ORDER — SODIUM CHLORIDE 0.9 % IJ SOLN
10.0000 mL | Freq: Two times a day (BID) | INTRAMUSCULAR | Status: DC
Start: 2014-09-04 — End: 2014-09-04

## 2014-09-04 MED ORDER — ACETAMINOPHEN 160 MG/5ML PO SOLN
650.0000 mg | Freq: Four times a day (QID) | ORAL | Status: DC | PRN
  Filled 2014-09-04: qty 20.3

## 2014-09-04 MED ORDER — POTASSIUM CHLORIDE 10 MEQ/50ML IV SOLN: 10.0000 meq | INTRAVENOUS | Status: DC

## 2014-09-04 MED ORDER — SODIUM CHLORIDE 0.9 % IJ SOLN: 10.0000 mL | INTRAMUSCULAR | Status: DC | PRN

## 2014-09-04 MED ORDER — DEXTROSE 50 % IV SOLN
25.0000 mL | Freq: Once | INTRAVENOUS | Status: AC | PRN
  Administered 2014-09-04: 25 mL via INTRAVENOUS

## 2014-09-04 MED ORDER — FAMOTIDINE IN NACL 20-0.9 MG/50ML-% IV SOLN
20.0000 mg | INTRAVENOUS | Status: DC
  Administered 2014-09-04 – 2014-09-07 (×4): 20 mg via INTRAVENOUS
  Filled 2014-09-04 (×5): qty 50

## 2014-09-04 MED ORDER — DEXTROSE 50 % IV SOLN
INTRAVENOUS | Status: AC
  Filled 2014-09-04: qty 50

## 2014-09-04 MED ORDER — POTASSIUM CHLORIDE 10 MEQ/50ML IV SOLN
10.0000 meq | INTRAVENOUS | Status: AC
  Administered 2014-09-04 (×4): 10 meq via INTRAVENOUS
  Filled 2014-09-04 (×4): qty 50

## 2014-09-04 MED ORDER — POTASSIUM CHLORIDE 20 MEQ/15ML (10%) PO LIQD
40.0000 meq | Freq: Once | ORAL | Status: AC
  Administered 2014-09-04: 40 meq
  Filled 2014-09-04 (×2): qty 30

## 2014-09-04 NOTE — Progress Notes (Signed)
PULMONARY / CRITICAL CARE MEDICINE   Name: Julie Saunders MRN: [id number] DOB: 24-Nov-1985    ADMISSION DATE:  08/31/2014  REFERRING MD :  Dr. Jacelyn Grip   CHIEF COMPLAINT:  Cardiac Arrest    INITIAL PRESENTATION:  28 y/o female admitted 10/22 after VFib arrest at home and prolonged CPR (prolonged down time) when presenting to the ER.  Etiology felt to be hypokalemia from diarrhea.  To ICU for hypothermia protocol.  STUDIES:   10/16  Colonoscopy (Per [facility]) >> displays active duodenitis with areas of villous blunting/atrophy in addition to a small granuloma with multinucleated giant cells as seen in one of the fragments. Given the history of Crohn's disease, the changes are most compatible with involvement by the same disease process 10/22  CT head >> neg 10/22  CT cervical spine >> neg 10/22  ECHO >> RV normal, LVEF 20-25% 10/23 LE Doppler >>neg 10./24 ct>>Bowel wall thickening of terminal ileum and significant portions ofparticularly the distal colon compatible with enteritis, favor Crohn's disease in light of history.<Bibasilar effusions, atelectasis and suspected lower lobe  SIGNIFICANT EVENTS: 10/22  Admit after several days diarrhea, cardiac arrest at home with prolonged CPR 10/24- rewarmed, follows commands 10/25- improved levo major  SUBJECTIVE: pressors down sig  VITAL SIGNS: Temp:  [97.5 F (36.4 C)-99.1 F (37.3 C)] 98.4 F (36.9 C) (10/26 0400) Pulse Rate:  [52-127] 67 (10/26 0900) Resp:  [8-28] 13 (10/26 0900) BP: (93-121)/(51-60) 121/60 mmHg (10/26 0839) SpO2:  [99 %-100 %] 100 % (10/26 0900) Arterial Line BP: (83-136)/(46-87) 115/57 mmHg (10/26 0900) FiO2 (%):  [40 %] 40 % (10/26 0839)  HEMODYNAMICS: CVP:  [6 mmHg-9 mmHg] 9 mmHg  VENTILATOR SETTINGS: Vent Mode:  [-] PSV;CPAP FiO2 (%):  [40 %] 40 % Set Rate:  [12 bmp] 12 bmp Vt Set:  [420 mL] 420 mL PEEP:  [5 cmH20] 5 cmH20 Pressure Support:  [5 cmH20-10 cmH20] 5 cmH20 Plateau Pressure:  [14 cmH20-17 cmH20]  17 cmH20  INTAKE / OUTPUT:  Intake/Output Summary (Last 24 hours) at 09/04/14 0959 Last data filed at 09/04/14 0900  Gross per 24 hour  Intake 2663.24 ml  Output   2170 ml  Net 493.24 ml    PHYSICAL EXAMINATION: General:  No pain, awake follows commands HEENT: no pain, jvd wnl and increased PULM: coarse CV: RRR, no mgr AB: BS wnl, no r/g, soft, pads wnl Ext: good cap refil, color wnl and improved all ext, dopler wnl now on ext Neuro: rass -1, follows commands  LABS:  CBC  Recent Labs Lab 09/02/14 0735 09/03/14 0335 09/04/14 0515  WBC 35.8* 34.8* 27.9*  HGB 14.8 11.8* 10.0*  HCT 40.6 32.5* 28.4*  PLT 172 175 152   Coag's  Recent Labs Lab 08/31/14 1219 08/31/14 1413 08/31/14 2015  APTT  --  32 33  INR 1.40 1.14 1.12   BMET  Recent Labs Lab 09/03/14 1035 09/03/14 2325 09/04/14 0515  NA 129* 129* 129*  K 4.0 3.7 3.2*  CL 93* 92* 93*  CO2 21 24 25   BUN 14 19 20   CREATININE 2.13* 2.44* 2.49*  GLUCOSE 98 92 91   Electrolytes  Recent Labs Lab 08/31/14 1219  08/31/14 1413  08/31/14 2121  09/02/14 0525  09/03/14 0335 09/03/14 1035 09/03/14 2325 09/04/14 0515  CALCIUM 7.6*  --   --   < >  --   < > 7.1*  < > 7.1* 7.1* 7.7* 7.8*  MG  --   < > 1.5  --  1.9  < > 2.3  --  2.1  --   --  2.2  PHOS  --   --  3.1  --  2.2*  --   --   --   --   --   --   --   < > = values in this interval not displayed. Sepsis Markers  Recent Labs Lab 08/31/14 1219 08/31/14 1233 09/01/14 1027  LATICACIDVEN 17.2* 15.78* 4.6*   ABG  Recent Labs Lab 09/02/14 1119 09/03/14 0355 09/03/14 1206  PHART 7.345* 7.511* 7.364  PCO2ART 22.8* 23.6* 34.8*  PO2ART 110.0* 142.0* 176.0*   Liver Enzymes  Recent Labs Lab 09/02/14 0525 09/03/14 0335 09/04/14 0515  AST 69* 127* 211*  ALT 44* 59* 97*  ALKPHOS 68 88 66  BILITOT 0.4 0.6 0.5  ALBUMIN 2.1* 1.6* 1.5*   Cardiac Enzymes  Recent Labs Lab 08/31/14 1900 08/31/14 2328 09/01/14 0420  TROPONINI 1.27* 1.02*  0.97*   Glucose  Recent Labs Lab 09/03/14 0729 09/03/14 1145 09/03/14 2105 09/03/14 2328 09/04/14 0514 09/04/14 0722  GLUCAP 85 103* 85 93 93 87    Imaging 10/24CXR> ETT, cvl noted, diffuse int infiltrate rt greater left  ASSESSMENT / PLAN:  PULMONARY  OETT 10/22 >>  A:   Acute hypoxemic respiratory failure - in the setting of cardiac arrest, likely secondary to QTC from hypoK Bilateral basilar infiltrates / atx P:   Continue PS trials but no extubation given mental status and agitation ABG to follow pCXR in am   CARDIOVASCULAR CVL L IJ 10/22 >> A:  VFib arrest - most likely hypokalemia, PE unlikely with normal RV  Shock > cardiogenic likely IS this septic shock? See ID Prolonged QTc > resolved NSTEMI> stunned myocardium from  No evidence of rel AI Reduced EF, septic cardiomyoapthy? takasub? P:  Wean pressors as able. Repeat echo today. Cards consult called 10/26  RENAL A:    Hypokalemia Hypocalcemia Mixed metabolic acidosis> from diarrhea and lactic acidosis P:   D/C bicarb drip Low dose lasix today  GASTROINTESTINAL A:  Crohns - colonoscopy done 10/16 with multiple bx sites, neg for malignancy.  C/W Active Gastritis.   LLQ ABD Pain - r/o septic source Diarrhea  Elevated Ammonia resolved, due to shock Protein Calorie Malnutrition admission albumine 1.8 P:   GI prophylaxis with IV pepcid Hold home medications: pentasa, imodium Increase TF to goal Will consider steroids if GI feels necessary but hold off for now.  HEMATOLOGIC A:   dvt prevention, leukocytosis P:  Monitor CBC in am   DVT: SCD's + heparin sq LE doppler neg  INFECTIOUS A:   Diarrhea - Crohns vs enterocolitis R/o abdo source (remains in shock, leukocytosis) P:   BCx2 10/22 >> Stool Culture 10/22 >>  Abx: Zosyn 10/22>>> vanc 10/24>>10/26 casp 10/24>>>10/25  Dced caspo as neg perf abscess CT CT reviewed Vanc d/ced on 10/26  ENDOCRINE A:   Hyperglycemia - likely  stress response, no hx of DM P:   SSI No role stress steroids  NEUROLOGIC A:   Acute anoxic encephalopathy - post arrest  Possible seizure activity and decorticate posturing prior to hypothermia Improved neuro Neck cleared clinically and by CT P:   RASS goal: 0 Fent drip. Propofol drip WUA  GLOBAL:    - Updates: Family updated at bedside 10/26  TODAY'S SUMMARY: Continue zosyn for now, will repeat echo and call cards for consult today.  I have personally obtained a history, examined the patient, evaluated laboratory and  imaging results, formulated the assessment and plan and placed orders.  CRITICAL CARE: The patient is critically ill with multiple organ systems failure and requires high complexity decision making for assessment and support, frequent evaluation and titration of therapies, application of advanced monitoring technologies and extensive interpretation of multiple databases. Critical Care Time devoted to patient care services described in this note is 35 minutes.   Rush Farmer, M.D. [facility] Pulmonary/Critical Care Medicine. Pager: [phone]. After hours pager: [phone].

## 2014-09-04 NOTE — Progress Notes (Signed)
Echocardiogram 2D Echocardiogram has been performed.   Julie Saunders 09/04/2014, 11:44 AM

## 2014-09-04 NOTE — Progress Notes (Signed)
[facility] PROGRESS NOTE Subjective Still some diarrhea, ? Waking some  Objective: Vital signs in last 24 hours: Temp:  [98 F (36.7 C)-99.1 F (37.3 C)] 98 F (36.7 C) (10/26 1108) Pulse Rate:  [52-127] 58 (10/26 1200) Resp:  [8-24] 15 (10/26 1200) BP: (105-121)/(52-60) 106/52 mmHg (10/26 1108) SpO2:  [100 %] 100 % (10/26 1200) Arterial Line BP: (83-136)/(46-87) 116/57 mmHg (10/26 1200) FiO2 (%):  [40 %] 40 % (10/26 1137) Last BM Date: 09/04/14 (flexi seal)  Intake/Output from previous day: 10/25 0701 - 10/26 0700 In: 2755.1 [I.V.:1925.1; NG/GT:280; IV Piggyback:550] Out: 2030 [Urine:1330; Stool:700] Intake/Output this shift: Total I/O In: 346.3 [I.V.:228.8; NG/GT:67.5; IV Piggyback:50] Out: 200 [Urine:200]  PE: General--entubated nonresponsive  Abdomen--soft nontender  Lab Results:  Recent Labs  09/02/14 0525 09/02/14 0735 09/03/14 0335 09/04/14 0515  WBC 33.1* 35.8* 34.8* 27.9*  HGB 14.9 14.8 11.8* 10.0*  HCT 41.6 40.6 32.5* 28.4*  PLT 171 172 175 152   BMET  Recent Labs  09/02/14 2230 09/03/14 0335 09/03/14 1035 09/03/14 2325 09/04/14 0515  NA 127* 128* 129* 129* 129*  K 4.2 4.3 4.0 3.7 3.2*  CL 92* 92* 93* 92* 93*  CO2 15* 18* 21 24 25   CREATININE 1.90* 2.00* 2.13* 2.44* 2.49*   LFT  Recent Labs  09/02/14 0525 09/03/14 0335 09/04/14 0515  PROT 5.1* 4.4* 4.2*  AST 69* 127* 211*  ALT 44* 59* 97*  ALKPHOS 68 88 66  BILITOT 0.4 0.6 0.5  BILIDIR <0.2  --   --   IBILI NOT CALCULATED  --   --    PT/INR No results found for this basename: LABPROT, INR,  in the last 72 hours PANCREAS No results found for this basename: LIPASE,  in the last 72 hours       Studies/Results: Ct Abdomen Pelvis W Contrast  09/02/2014   CLINICAL DATA:  Patient with Crohn's disease and acute sepsis, cardiac arrest, high suspicion of intra-abdominal abscess due to Crohn's and sepsis, impaired renal function  EXAM: CT ABDOMEN AND PELVIS WITH  CONTRAST  TECHNIQUE: Multidetector CT imaging of the abdomen and pelvis was performed using the standard protocol following bolus administration of intravenous contrast. Sagittal and coronal MPR images reconstructed from axial data set.  CONTRAST:  71mL OMNIPAQUE IOHEXOL 300 MG/ML SOLN. Dilute oral contrast.  COMPARISON:  None  FINDINGS: BILATERAL pleural effusions and basilar volume loss with consolidation in both lower lobes.  Diffuse fatty infiltration of liver.  Scattered beam hardening artifacts from inclusion of patient's arms in imaged field.  Liver, spleen, pancreas, kidneys, and adrenal glands otherwise normal.  Diffuse wall thickening of the terminal ileum and multiple sites of the colon consistent with history of Crohn's disease.  Air and Foley catheter within decompressed urinary bladder.  Unremarkable uterus and adnexae for age.  Significant free fluid in abdomen and pelvis including perihepatic and perisplenic.  No definite free intraperitoneal air identified however.  Rectal tube and Foley catheter present.  No mass, adenopathy, or hernia.  No acute osseous findings.  Gallbladder appears distended without calcification.  Stomach and remaining bowel loops unremarkable.  Scattered subcutaneous edema.  IMPRESSION: Bowel wall thickening of terminal ileum and significant portions of particularly the distal colon compatible with enteritis, favor Crohn's disease in light of history.  Bibasilar effusions, atelectasis and suspected lower lobe consolidation.  Associated significant ascites but no discrete abscess collection or free air identified.  Scattered subcutaneous edema and soft tissue edema which may be due to hypoproteinemia, anasarca  or fluid overload.   Electronically Signed   By: Lavonia Dana M.D.   On: 09/02/2014 14:38   Dg Chest Port 1 View  09/04/2014   CLINICAL DATA:  Cardiac arrest.  Assess support apparatus.  EXAM: PORTABLE CHEST - 1 VIEW  COMPARISON:  09/03/2014.  FINDINGS: Support  apparatus: Unchanged.  Cardiomediastinal Silhouette:  Unchanged.  Lungs: Slight improvement in bilateral airspace disease compatible with pulmonary edema. Some of the airspace disease has shifted. More prominent basilar atelectasis. No pneumothorax.  Effusions:  None.  Other: LEFT costophrenic angle excluded from view. Defibrillator pads remain present.  IMPRESSION: 1. Unchanged support apparatus. 2. Shifting airspace disease compatible with pulmonary edema.   Electronically Signed   By: Dereck Ligas M.D.   On: 09/04/2014 07:54   Dg Chest Port 1 View  09/03/2014   CLINICAL DATA:  28 year old female with acute respiratory failure.  EXAM: PORTABLE CHEST - 1 VIEW  COMPARISON:  Chest x-ray 09/02/2014.  FINDINGS: An endotracheal tube is in place with tip 2.3 cm above the carina. There is a left-sided internal jugular central venous catheter with tip terminating in the right atrium approximately 1.5 cm distal to the superior cavoatrial junction. A nasogastric tube is seen extending into the stomach, however, the tip of the nasogastric tube extends below the lower margin of the image. Transcutaneous defibrillator pads project over the lower thorax and upper abdomen on the left. Lung volumes are low. Patchy bibasilar opacities are noted, compatible with a combination of atelectasis and/or consolidation. Small bilateral pleural effusions. There is a lucency and peripheral linear opacity in the lateral aspect of the right mid to lower lung, favored to represent a skin fold. Heart size is normal. The patient is rotated to the left on today's exam, resulting in distortion of the mediastinal contours and reduced diagnostic sensitivity and specificity for mediastinal pathology.  IMPRESSION: 1. Support apparatus, as above. 2. Persistent multifocal patchy opacities throughout the mid to lower lungs bilaterally, compatible with a combination of atelectasis and/or consolidation, with superimposed small bilateral pleural  effusions. 3. New low vertically oriented lucency and opacity in the periphery of the right lung compatible with a skin fold artifact. This is present in a different configuration on the subsequent follow-up chest x-ray obtained at 6:27 a.m.   Electronically Signed   By: Vinnie Langton M.D.   On: 09/03/2014 07:21   Dg Chest Port 1 View  09/03/2014   CLINICAL DATA:  Adjustment of endotracheal tube.  EXAM: PORTABLE CHEST - 1 VIEW  COMPARISON:  09/03/2014 at 4:19 a.m.  FINDINGS: Endotracheal tube tip projects 1 cm above the carina. This is without significant change from the earlier exam.  Left internal jugular central venous line tip lies at the caval atrial junction, stable. Nasogastric tube passes below the diaphragm into the stomach.  Cardiac silhouette is normal in size. Normal mediastinal and hilar contours.  Lung base opacity, most evident on the left, may all be atelectasis. Pneumonia is possible. Hazy airspace opacity noted on the previous day's study, most evident in the right upper lobe, has mildly improved. This suggest improved pulmonary edema. No pneumothorax.  IMPRESSION: 1. Endotracheal tube is well positioned. Tip projects 1 cm above the carina. Remaining support apparatus is stable and well positioned. 2. Remaining support apparatus is also well positioned. 3. Left lung base opacity stable from the previous day's exam, likely atelectasis. Pneumonia is possible. Other areas of lung opacity that is likely due to asymmetric edema mildly improved.  Electronically Signed   By: Lajean Manes M.D.   On: 09/03/2014 07:10    Medications: I have reviewed the patient's current medications.  Assessment/Plan: 1. Diarrhea. Probably crohns but will check for c diff   Doreatha Offer JR,Rayn Shorb L 09/04/2014, 12:26 PM

## 2014-09-04 NOTE — Consult Note (Signed)
Primary cardiologist: New  HPI: 28 year old female with past medical history of Crohn's and migraine headaches for evaluation status post cardiac arrest. Patient is presently intubated and sedated. Minimal response to questions. She was admitted on October 22 after collapsing at home. She apparently was down for 10-15 minutes before emergency personnel arrived. CPR was initiated and continued for 15 minutes by fire department personnel. EMS continued ACLS protocol for 15 minutes after their arrival with return of spontaneous circulation. She was defibrillated 4 times for ventricular fibrillation but no strips are available. The patient was cooled by critical care medicine. An echocardiogram has revealed an ejection fraction of 25-30%. Cardiology was asked to evaluate. Note her potassium on admission was 2.6. Initial electrocardiogram showed sinus rhythm with prolonged QT interval. The patient slowly shakes her head no to chest pain or dyspnea.  Medications Prior to Admission  Medication Sig Dispense Refill  . acetaminophen (TYLENOL) 325 MG tablet Take 650 mg by mouth every 6 (six) hours as needed for mild pain.       Marland Kitchen ALPRAZolam (XANAX) 0.25 MG tablet Take 0.25 mg by mouth 3 (three) times daily as needed for anxiety.      . dimenhyDRINATE (DRAMAMINE) 50 MG tablet Take 50 mg by mouth daily as needed for dizziness.      . mesalamine (PENTASA) 250 MG CR capsule Take 1,000 mg by mouth 4 (four) times daily.      . promethazine (PHENERGAN) 25 MG tablet Take 25 mg by mouth every 4 (four) hours as needed for nausea or vomiting.      Marland Kitchen amoxicillin-clavulanate (AUGMENTIN) 875-125 MG per tablet Take 1 tablet by mouth every 12 (twelve) hours. Farther states she had to take for a short amount of time. He thinks she completed course of medication two weeks ago from today (08-31-14)      . doxycycline (VIBRA-TABS) 100 MG tablet Take 100 mg by mouth 2 (two) times daily. Take for short amount of time per farther.  Farther states he thinks she completed course of medication  couple weeks ago from today (08-31-14)        Allergies  Allergen Reactions  . Sulfa Antibiotics     Past Medical History  Diagnosis Date  . Crohn disease   . Migraine   . Allergy   . Pancytopenia 11/23/2012    History reviewed. No pertinent past surgical history.  History   Social History  . Marital Status: Single    Spouse Name: N/A    Number of Children: N/A  . Years of Education: N/A   Occupational History  . Not on file.   Social History Main Topics  . Smoking status: Never Smoker   . Smokeless tobacco: Not on file  . Alcohol Use: 0.5 oz/week    1 drink(s) per week  . Drug Use: No  . Sexual Activity: Not on file   Other Topics Concern  . Not on file   Social History Narrative  . No narrative on file    Family History  Problem Relation Age of Onset  . Heart attack Mother     ROS:  Unobtainable as patient is intubated and sedated.  Physical Exam:   Blood pressure 106/52, pulse 58, temperature 98 F (36.7 C), temperature source Core (Comment), resp. rate 15, height 5' 6"  (1.676 m), weight 193 lb 5.5 oz (87.7 kg), SpO2 100.00%.  General:  Well developed/well nourished; intubated Skin warm/dry Patient not depressed No peripheral clubbing Back-normal HEENT-normal/normal eyelids  Neck supple/normal carotid upstroke bilaterally; no bruits; no JVD; no thyromegaly chest - CTA/ normal expansion CV - RRR/normal S1 and S2; no murmurs, rubs or gallops;  PMI nondisplaced Abdomen -NT/ND, no HSM, no mass, + bowel sounds, no bruit 2+ femoral pulses, no bruits Ext-1+ ankle edema, no chords, 2+ DP Neuro-grossly nonfocal  Admission-ECG Sinus rhythm, prolonged QT interval. Electrocardiogram today shows sinus rhythm, anterior lateral T-wave inversion and markedly prolonged QT interval.  Results for orders placed during the hospital encounter of 08/31/14 (from the past 48 hour(s))  GLUCOSE, CAPILLARY      Status: Abnormal   Collection Time    09/02/14  5:47 PM      Result Value Ref Range   Glucose-Capillary 114 (*) 70 - 99 mg/dL   Comment 1 Arterial Sample    GLUCOSE, CAPILLARY     Status: Abnormal   Collection Time    09/02/14  8:31 PM      Result Value Ref Range   Glucose-Capillary 124 (*) 70 - 99 mg/dL  BASIC METABOLIC PANEL     Status: Abnormal   Collection Time    09/02/14 10:30 PM      Result Value Ref Range   Sodium 127 (*) 137 - 147 mEq/L   Potassium 4.2  3.7 - 5.3 mEq/L   Chloride 92 (*) 96 - 112 mEq/L   CO2 15 (*) 19 - 32 mEq/L   Glucose, Bld 102 (*) 70 - 99 mg/dL   BUN 10  6 - 23 mg/dL   Creatinine, Ser 1.90 (*) 0.50 - 1.10 mg/dL   Calcium 7.1 (*) 8.4 - 10.5 mg/dL   GFR calc non Af Amer 35 (*) >90 mL/min   GFR calc Af Amer 41 (*) >90 mL/min   Comment: (NOTE)     The eGFR has been calculated using the CKD EPI equation.     This calculation has not been validated in all clinical situations.     eGFR's persistently <90 mL/min signify possible Chronic Kidney     Disease.   Anion gap 20 (*) 5 - 15  GLUCOSE, CAPILLARY     Status: Abnormal   Collection Time    09/03/14 12:19 AM      Result Value Ref Range   Glucose-Capillary 129 (*) 70 - 99 mg/dL  MAGNESIUM     Status: None   Collection Time    09/03/14  3:35 AM      Result Value Ref Range   Magnesium 2.1  1.5 - 2.5 mg/dL  COMPREHENSIVE METABOLIC PANEL     Status: Abnormal   Collection Time    09/03/14  3:35 AM      Result Value Ref Range   Sodium 128 (*) 137 - 147 mEq/L   Potassium 4.3  3.7 - 5.3 mEq/L   Chloride 92 (*) 96 - 112 mEq/L   CO2 18 (*) 19 - 32 mEq/L   Glucose, Bld 117 (*) 70 - 99 mg/dL   BUN 12  6 - 23 mg/dL   Creatinine, Ser 2.00 (*) 0.50 - 1.10 mg/dL   Calcium 7.1 (*) 8.4 - 10.5 mg/dL   Total Protein 4.4 (*) 6.0 - 8.3 g/dL   Albumin 1.6 (*) 3.5 - 5.2 g/dL   AST 127 (*) 0 - 37 U/L   ALT 59 (*) 0 - 35 U/L   Alkaline Phosphatase 88  39 - 117 U/L   Total Bilirubin 0.6  0.3 - 1.2 mg/dL   GFR  calc non  Af Amer 33 (*) >90 mL/min   GFR calc Af Amer 38 (*) >90 mL/min   Comment: (NOTE)     The eGFR has been calculated using the CKD EPI equation.     This calculation has not been validated in all clinical situations.     eGFR's persistently <90 mL/min signify possible Chronic Kidney     Disease.   Anion gap 18 (*) 5 - 15  CBC WITH DIFFERENTIAL     Status: Abnormal   Collection Time    09/03/14  3:35 AM      Result Value Ref Range   WBC 34.8 (*) 4.0 - 10.5 K/uL   RBC 3.97  3.87 - 5.11 MIL/uL   Hemoglobin 11.8 (*) 12.0 - 15.0 g/dL   HCT 32.5 (*) 36.0 - 46.0 %   MCV 81.9  78.0 - 100.0 fL   MCH 29.7  26.0 - 34.0 pg   MCHC 36.3 (*) 30.0 - 36.0 g/dL   RDW 13.8  11.5 - 15.5 %   Platelets 175  150 - 400 K/uL   Neutrophils Relative % 90 (*) 43 - 77 %   Neutro Abs 31.3 (*) 1.7 - 7.7 K/uL   Lymphocytes Relative 4 (*) 12 - 46 %   Lymphs Abs 1.4  0.7 - 4.0 K/uL   Monocytes Relative 6  3 - 12 %   Monocytes Absolute 2.0 (*) 0.1 - 1.0 K/uL   Eosinophils Relative 0  0 - 5 %   Eosinophils Absolute 0.0  0.0 - 0.7 K/uL   Basophils Relative 0  0 - 1 %   Basophils Absolute 0.0  0.0 - 0.1 K/uL  GLUCOSE, CAPILLARY     Status: Abnormal   Collection Time    09/03/14  3:35 AM      Result Value Ref Range   Glucose-Capillary 120 (*) 70 - 99 mg/dL  BLOOD GAS, ARTERIAL     Status: Abnormal   Collection Time    09/03/14  3:55 AM      Result Value Ref Range   FIO2 0.40     Delivery systems VENTILATOR     Mode PRESSURE REGULATED VOLUME CONTROL     VT 400     Rate 28     Peep/cpap 5.0     pH, Arterial 7.511 (*) 7.350 - 7.450   pCO2 arterial 23.6 (*) 35.0 - 45.0 mmHg   pO2, Arterial 142.0 (*) 80.0 - 100.0 mmHg   Bicarbonate 18.8 (*) 20.0 - 24.0 mEq/L   TCO2 19.5  0 - 100 mmol/L   Acid-base deficit 3.8 (*) 0.0 - 2.0 mmol/L   O2 Saturation 99.0     Patient temperature 98.6     Collection site A-LINE     Drawn by RN     Sample type ARTERIAL DRAW    GLUCOSE, CAPILLARY     Status: None    Collection Time    09/03/14  7:29 AM      Result Value Ref Range   Glucose-Capillary 85  70 - 99 mg/dL   Comment 1 Capillary Sample    BASIC METABOLIC PANEL     Status: Abnormal   Collection Time    09/03/14 10:35 AM      Result Value Ref Range   Sodium 129 (*) 137 - 147 mEq/L   Potassium 4.0  3.7 - 5.3 mEq/L   Chloride 93 (*) 96 - 112 mEq/L   CO2 21  19 - 32 mEq/L  Glucose, Bld 98  70 - 99 mg/dL   BUN 14  6 - 23 mg/dL   Creatinine, Ser 2.13 (*) 0.50 - 1.10 mg/dL   Calcium 7.1 (*) 8.4 - 10.5 mg/dL   GFR calc non Af Amer 31 (*) >90 mL/min   GFR calc Af Amer 36 (*) >90 mL/min   Comment: (NOTE)     The eGFR has been calculated using the CKD EPI equation.     This calculation has not been validated in all clinical situations.     eGFR's persistently <90 mL/min signify possible Chronic Kidney     Disease.   Anion gap 15  5 - 15  GLUCOSE, CAPILLARY     Status: Abnormal   Collection Time    09/03/14 11:45 AM      Result Value Ref Range   Glucose-Capillary 103 (*) 70 - 99 mg/dL   Comment 1 Capillary Sample    POCT I-STAT 3, ART BLOOD GAS (G3+)     Status: Abnormal   Collection Time    09/03/14 12:06 PM      Result Value Ref Range   pH, Arterial 7.364  7.350 - 7.450   pCO2 arterial 34.8 (*) 35.0 - 45.0 mmHg   pO2, Arterial 176.0 (*) 80.0 - 100.0 mmHg   Bicarbonate 19.9 (*) 20.0 - 24.0 mEq/L   TCO2 21  0 - 100 mmol/L   O2 Saturation 100.0     Acid-base deficit 5.0 (*) 0.0 - 2.0 mmol/L   Patient temperature 36.9 C     Collection site RADIAL, ALLEN'S TEST ACCEPTABLE     Drawn by VENIPUNCTURE     Sample type ARTERIAL    GLUCOSE, CAPILLARY     Status: None   Collection Time    09/03/14  9:05 PM      Result Value Ref Range   Glucose-Capillary 85  70 - 99 mg/dL  BASIC METABOLIC PANEL     Status: Abnormal   Collection Time    09/03/14 11:25 PM      Result Value Ref Range   Sodium 129 (*) 137 - 147 mEq/L   Potassium 3.7  3.7 - 5.3 mEq/L   Chloride 92 (*) 96 - 112 mEq/L   CO2  24  19 - 32 mEq/L   Glucose, Bld 92  70 - 99 mg/dL   BUN 19  6 - 23 mg/dL   Creatinine, Ser 2.44 (*) 0.50 - 1.10 mg/dL   Calcium 7.7 (*) 8.4 - 10.5 mg/dL   GFR calc non Af Amer 26 (*) >90 mL/min   GFR calc Af Amer 30 (*) >90 mL/min   Comment: (NOTE)     The eGFR has been calculated using the CKD EPI equation.     This calculation has not been validated in all clinical situations.     eGFR's persistently <90 mL/min signify possible Chronic Kidney     Disease.   Anion gap 13  5 - 15  GLUCOSE, CAPILLARY     Status: None   Collection Time    09/03/14 11:28 PM      Result Value Ref Range   Glucose-Capillary 93  70 - 99 mg/dL  GLUCOSE, CAPILLARY     Status: None   Collection Time    09/04/14  5:14 AM      Result Value Ref Range   Glucose-Capillary 93  70 - 99 mg/dL   Comment 1 Arterial Sample    MAGNESIUM     Status:  None   Collection Time    09/04/14  5:15 AM      Result Value Ref Range   Magnesium 2.2  1.5 - 2.5 mg/dL  COMPREHENSIVE METABOLIC PANEL     Status: Abnormal   Collection Time    09/04/14  5:15 AM      Result Value Ref Range   Sodium 129 (*) 137 - 147 mEq/L   Potassium 3.2 (*) 3.7 - 5.3 mEq/L   Chloride 93 (*) 96 - 112 mEq/L   CO2 25  19 - 32 mEq/L   Glucose, Bld 91  70 - 99 mg/dL   BUN 20  6 - 23 mg/dL   Creatinine, Ser 2.49 (*) 0.50 - 1.10 mg/dL   Calcium 7.8 (*) 8.4 - 10.5 mg/dL   Total Protein 4.2 (*) 6.0 - 8.3 g/dL   Albumin 1.5 (*) 3.5 - 5.2 g/dL   AST 211 (*) 0 - 37 U/L   ALT 97 (*) 0 - 35 U/L   Alkaline Phosphatase 66  39 - 117 U/L   Total Bilirubin 0.5  0.3 - 1.2 mg/dL   GFR calc non Af Amer 25 (*) >90 mL/min   GFR calc Af Amer 29 (*) >90 mL/min   Comment: (NOTE)     The eGFR has been calculated using the CKD EPI equation.     This calculation has not been validated in all clinical situations.     eGFR's persistently <90 mL/min signify possible Chronic Kidney     Disease.   Anion gap 11  5 - 15  CBC WITH DIFFERENTIAL     Status: Abnormal    Collection Time    09/04/14  5:15 AM      Result Value Ref Range   WBC 27.9 (*) 4.0 - 10.5 K/uL   RBC 3.35 (*) 3.87 - 5.11 MIL/uL   Hemoglobin 10.0 (*) 12.0 - 15.0 g/dL   HCT 28.4 (*) 36.0 - 46.0 %   MCV 84.8  78.0 - 100.0 fL   MCH 29.9  26.0 - 34.0 pg   MCHC 35.2  30.0 - 36.0 g/dL   RDW 14.5  11.5 - 15.5 %   Platelets 152  150 - 400 K/uL   Neutrophils Relative % 84 (*) 43 - 77 %   Lymphocytes Relative 10 (*) 12 - 46 %   Monocytes Relative 6  3 - 12 %   Eosinophils Relative 0  0 - 5 %   Basophils Relative 0  0 - 1 %   Neutro Abs 23.4 (*) 1.7 - 7.7 K/uL   Lymphs Abs 2.8  0.7 - 4.0 K/uL   Monocytes Absolute 1.7 (*) 0.1 - 1.0 K/uL   Eosinophils Absolute 0.0  0.0 - 0.7 K/uL   Basophils Absolute 0.0  0.0 - 0.1 K/uL   RBC Morphology BURR CELLS     WBC Morphology TOXIC GRANULATION    GLUCOSE, CAPILLARY     Status: None   Collection Time    09/04/14  7:22 AM      Result Value Ref Range   Glucose-Capillary 87  70 - 99 mg/dL   Comment 1 Capillary Sample    GLUCOSE, CAPILLARY     Status: None   Collection Time    09/04/14 11:11 AM      Result Value Ref Range   Glucose-Capillary 70  70 - 99 mg/dL   Comment 1 Capillary Sample      Ct Abdomen Pelvis W Contrast  09/02/2014  CLINICAL DATA:  Patient with Crohn's disease and acute sepsis, cardiac arrest, high suspicion of intra-abdominal abscess due to Crohn's and sepsis, impaired renal function  EXAM: CT ABDOMEN AND PELVIS WITH CONTRAST  TECHNIQUE: Multidetector CT imaging of the abdomen and pelvis was performed using the standard protocol following bolus administration of intravenous contrast. Sagittal and coronal MPR images reconstructed from axial data set.  CONTRAST:  55m OMNIPAQUE IOHEXOL 300 MG/ML SOLN. Dilute oral contrast.  COMPARISON:  None  FINDINGS: BILATERAL pleural effusions and basilar volume loss with consolidation in both lower lobes.  Diffuse fatty infiltration of liver.  Scattered beam hardening artifacts from inclusion of  patient's arms in imaged field.  Liver, spleen, pancreas, kidneys, and adrenal glands otherwise normal.  Diffuse wall thickening of the terminal ileum and multiple sites of the colon consistent with history of Crohn's disease.  Air and Foley catheter within decompressed urinary bladder.  Unremarkable uterus and adnexae for age.  Significant free fluid in abdomen and pelvis including perihepatic and perisplenic.  No definite free intraperitoneal air identified however.  Rectal tube and Foley catheter present.  No mass, adenopathy, or hernia.  No acute osseous findings.  Gallbladder appears distended without calcification.  Stomach and remaining bowel loops unremarkable.  Scattered subcutaneous edema.  IMPRESSION: Bowel wall thickening of terminal ileum and significant portions of particularly the distal colon compatible with enteritis, favor Crohn's disease in light of history.  Bibasilar effusions, atelectasis and suspected lower lobe consolidation.  Associated significant ascites but no discrete abscess collection or free air identified.  Scattered subcutaneous edema and soft tissue edema which may be due to hypoproteinemia, anasarca or fluid overload.   Electronically Signed   By: MLavonia DanaM.D.   On: 09/02/2014 14:38   Dg Chest Port 1 View  09/04/2014   CLINICAL DATA:  Cardiac arrest.  Assess support apparatus.  EXAM: PORTABLE CHEST - 1 VIEW  COMPARISON:  09/03/2014.  FINDINGS: Support apparatus: Unchanged.  Cardiomediastinal Silhouette:  Unchanged.  Lungs: Slight improvement in bilateral airspace disease compatible with pulmonary edema. Some of the airspace disease has shifted. More prominent basilar atelectasis. No pneumothorax.  Effusions:  None.  Other: LEFT costophrenic angle excluded from view. Defibrillator pads remain present.  IMPRESSION: 1. Unchanged support apparatus. 2. Shifting airspace disease compatible with pulmonary edema.   Electronically Signed   By: GDereck LigasM.D.   On: 09/04/2014  07:54   Dg Chest Port 1 View  09/03/2014   CLINICAL DATA:  27year old female with acute respiratory failure.  EXAM: PORTABLE CHEST - 1 VIEW  COMPARISON:  Chest x-ray 09/02/2014.  FINDINGS: An endotracheal tube is in place with tip 2.3 cm above the carina. There is a left-sided internal jugular central venous catheter with tip terminating in the right atrium approximately 1.5 cm distal to the superior cavoatrial junction. A nasogastric tube is seen extending into the stomach, however, the tip of the nasogastric tube extends below the lower margin of the image. Transcutaneous defibrillator pads project over the lower thorax and upper abdomen on the left. Lung volumes are low. Patchy bibasilar opacities are noted, compatible with a combination of atelectasis and/or consolidation. Small bilateral pleural effusions. There is a lucency and peripheral linear opacity in the lateral aspect of the right mid to lower lung, favored to represent a skin fold. Heart size is normal. The patient is rotated to the left on today's exam, resulting in distortion of the mediastinal contours and reduced diagnostic sensitivity and specificity for mediastinal pathology.  IMPRESSION: 1. Support apparatus, as above. 2. Persistent multifocal patchy opacities throughout the mid to lower lungs bilaterally, compatible with a combination of atelectasis and/or consolidation, with superimposed small bilateral pleural effusions. 3. New low vertically oriented lucency and opacity in the periphery of the right lung compatible with a skin fold artifact. This is present in a different configuration on the subsequent follow-up chest x-ray obtained at 6:27 a.m.   Electronically Signed   By: Vinnie Langton M.D.   On: 09/03/2014 07:21   Dg Chest Port 1 View  09/03/2014   CLINICAL DATA:  Adjustment of endotracheal tube.  EXAM: PORTABLE CHEST - 1 VIEW  COMPARISON:  09/03/2014 at 4:19 a.m.  FINDINGS: Endotracheal tube tip projects 1 cm above the  carina. This is without significant change from the earlier exam.  Left internal jugular central venous line tip lies at the caval atrial junction, stable. Nasogastric tube passes below the diaphragm into the stomach.  Cardiac silhouette is normal in size. Normal mediastinal and hilar contours.  Lung base opacity, most evident on the left, may all be atelectasis. Pneumonia is possible. Hazy airspace opacity noted on the previous day's study, most evident in the right upper lobe, has mildly improved. This suggest improved pulmonary edema. No pneumothorax.  IMPRESSION: 1. Endotracheal tube is well positioned. Tip projects 1 cm above the carina. Remaining support apparatus is stable and well positioned. 2. Remaining support apparatus is also well positioned. 3. Left lung base opacity stable from the previous day's exam, likely atelectasis. Pneumonia is possible. Other areas of lung opacity that is likely due to asymmetric edema mildly improved.   Electronically Signed   By: Lajean Manes M.D.   On: 09/03/2014 07:10    Assessment/Plan 28 year old female admitted following cardiac arrest at home. Prolonged down time. Patient was cooled. Initial electrocardiogram showed prolonged QT interval and initial potassium 2.6. Potassium supplemented but QT remains prolonged. Ejection fraction decreased at approximately 25%.  1 status post cardiac arrest-I do not have strips available but by report the patient was in ventricular fibrillation. Her initial potassium was 2.6 and her QT was prolonged. However her potassium has been supplemented and her QT remains prolonged. She also has a cardiomyopathy. Note she had a prolonged downtime but her mental status appears to be improving. We will follow and if she normalizes following extubation she would most likely require ICD (while her cardiomyopathy may be stress related her QT is prolonged despite supplementing potassium.). I will need to discuss with electrophysiology. 2  elevated troponin-most likely related to CPR prior to admission. However she would most likely require cardiac catheterization prior to ICD. 3 prolonged QT interval-it does not appear she was on significant medications that would cause prolonged QT interval at the time of admission. I do not have an old electrocardiogram for comparison. There is no family present to provide family history. She will most likely require ICD. 4 Cardiomyopathy-etiology unclear. Question stress related. TSH was mildly elevated at time of admission. Would add beta blockade and ACE inhibitor later once blood pressure allows and renal function improves. 5 acute renal failure-possibly secondary to ATN from recent cardiac arrest. 6 acute systolic congestive heart failure-the patient is volume overloaded. Continue Lasix and follow renal function. 7 VDRF-management per critical care medicine. 8 Crohn's disease 9. Anoxic encephalopathy-appears to be improving  Kirk Ruths MD 09/04/2014, 12:15 PM

## 2014-09-04 NOTE — Progress Notes (Signed)
eLink Physician-Brief Progress Note Patient Name: Julie Saunders DOB: November 03, 1986 MRN: [id number]   Date of Service  09/04/2014  HPI/Events of Note  Hypokalemia Received 2 dose of furosemide today Received 4 runs KCl today On HCO3 gtt (Note indicates was to be stopped) Serum HCO3 25  eICU Interventions  KCl 40 mEq per tube X 1 DC HCO3 gtt Repeat BMET @ 10 PM     Intervention Category Major Interventions: Electrolyte abnormality - evaluation and management  Merton Border 09/04/2014, 7:23 PM

## 2014-09-05 ENCOUNTER — Inpatient Hospital Stay (HOSPITAL_COMMUNITY): Payer: 59

## 2014-09-05 DIAGNOSIS — I519 Heart disease, unspecified: Secondary | ICD-10-CM

## 2014-09-05 DIAGNOSIS — A419 Sepsis, unspecified organism: Secondary | ICD-10-CM

## 2014-09-05 LAB — BLOOD GAS, ARTERIAL
ACID-BASE DEFICIT: 0.8 mmol/L (ref 0.0–2.0)
Bicarbonate: 24.7 mEq/L — ABNORMAL HIGH (ref 20.0–24.0)
DRAWN BY: 41977
FIO2: 0.4 %
LHR: 12 {breaths}/min
O2 SAT: 98.8 %
PATIENT TEMPERATURE: 98.5
PCO2 ART: 51 mmHg — AB (ref 35.0–45.0)
PEEP: 5 cmH2O
TCO2: 26.3 mmol/L (ref 0–100)
VT: 420 mL
pH, Arterial: 7.307 — ABNORMAL LOW (ref 7.350–7.450)
pO2, Arterial: 152 mmHg — ABNORMAL HIGH (ref 80.0–100.0)

## 2014-09-05 LAB — POCT I-STAT 3, ART BLOOD GAS (G3+)
Acid-Base Excess: 7 mmol/L — ABNORMAL HIGH (ref 0.0–2.0)
Bicarbonate: 33.3 mEq/L — ABNORMAL HIGH (ref 20.0–24.0)
O2 Saturation: 99 %
PH ART: 7.418 (ref 7.350–7.450)
TCO2: 35 mmol/L (ref 0–100)
pCO2 arterial: 51.1 mmHg — ABNORMAL HIGH (ref 35.0–45.0)
pO2, Arterial: 154 mmHg — ABNORMAL HIGH (ref 80.0–100.0)

## 2014-09-05 LAB — GLUCOSE, CAPILLARY
GLUCOSE-CAPILLARY: 83 mg/dL (ref 70–99)
GLUCOSE-CAPILLARY: 88 mg/dL (ref 70–99)
Glucose-Capillary: 100 mg/dL — ABNORMAL HIGH (ref 70–99)
Glucose-Capillary: 96 mg/dL (ref 70–99)
Glucose-Capillary: 82 mg/dL (ref 70–99)
Glucose-Capillary: 76 mg/dL (ref 70–99)
Glucose-Capillary: 77 mg/dL (ref 70–99)

## 2014-09-05 LAB — BASIC METABOLIC PANEL
Anion gap: 9 (ref 5–15)
BUN: 24 mg/dL — AB (ref 6–23)
CO2: 31 meq/L (ref 19–32)
Calcium: 7.9 mg/dL — ABNORMAL LOW (ref 8.4–10.5)
Chloride: 98 mEq/L (ref 96–112)
Creatinine, Ser: 2.83 mg/dL — ABNORMAL HIGH (ref 0.50–1.10)
GFR calc Af Amer: 25 mL/min — ABNORMAL LOW (ref >90)
GFR calc non Af Amer: 22 mL/min — ABNORMAL LOW (ref >90)
GLUCOSE: 83 mg/dL (ref 70–99)
POTASSIUM: 3.9 meq/L (ref 3.7–5.3)
Sodium: 138 mEq/L (ref 137–147)

## 2014-09-05 LAB — MAGNESIUM: MAGNESIUM: 2.1 mg/dL (ref 1.5–2.5)

## 2014-09-05 LAB — CBC
HEMATOCRIT: 28.5 % — AB (ref 36.0–46.0)
HEMOGLOBIN: 9.8 g/dL — AB (ref 12.0–15.0)
MCH: 29.3 pg (ref 26.0–34.0)
MCHC: 34.4 g/dL (ref 30.0–36.0)
MCV: 85.3 fL (ref 78.0–100.0)
Platelets: 96 10*3/uL — ABNORMAL LOW (ref 150–400)
RBC: 3.34 MIL/uL — AB (ref 3.87–5.11)
RDW: 14.4 % (ref 11.5–15.5)
WBC: 14.3 10*3/uL — ABNORMAL HIGH (ref 4.0–10.5)

## 2014-09-05 LAB — PHOSPHORUS: Phosphorus: 3.6 mg/dL (ref 2.3–4.6)

## 2014-09-05 MED ORDER — FUROSEMIDE 10 MG/ML IJ SOLN: 40.0000 mg | Freq: Four times a day (QID) | INTRAMUSCULAR | Status: DC

## 2014-09-05 MED ORDER — POTASSIUM CHLORIDE 10 MEQ/50ML IV SOLN
10.0000 meq | INTRAVENOUS | Status: AC
  Administered 2014-09-05 (×3): 10 meq via INTRAVENOUS
  Filled 2014-09-05 (×3): qty 50

## 2014-09-05 MED ORDER — CETYLPYRIDINIUM CHLORIDE 0.05 % MT LIQD
7.0000 mL | Freq: Two times a day (BID) | OROMUCOSAL | Status: DC
  Administered 2014-09-06 – 2014-09-14 (×17): 7 mL via OROMUCOSAL

## 2014-09-05 MED ORDER — FUROSEMIDE 10 MG/ML IJ SOLN
40.0000 mg | Freq: Three times a day (TID) | INTRAMUSCULAR | Status: AC
  Administered 2014-09-05 (×2): 40 mg via INTRAVENOUS
  Filled 2014-09-05 (×2): qty 4

## 2014-09-05 NOTE — Procedures (Signed)
Extubation Procedure Note  Patient Details:   Name: Julie Saunders DOB: 11-10-1986 MRN: [id number]   Airway Documentation:  Airway 7 mm (Active)  Secured at (cm) 24 cm 09/05/2014  7:35 AM  Measured From Lips 09/05/2014  7:35 AM  Secured Location Left 09/05/2014  7:35 AM  Secured By [employer] 09/05/2014  7:35 AM  Tube Holder Repositioned Yes 09/05/2014  7:35 AM  Cuff Pressure (cm H2O) 24 cm H2O 09/04/2014 11:50 PM  Site Condition Dry 09/05/2014  3:38 AM    Evaluation  O2 sats: stable throughout Complications: No apparent complications Patient did tolerate procedure well. Bilateral Breath Sounds: Diminished Suctioning: Airway Yes- Pt is oriented to person, and place.     No complications and patient is stableon 4L   Beatris Si D 09/05/2014, 10:28 AM

## 2014-09-05 NOTE — Progress Notes (Signed)
PULMONARY / CRITICAL CARE MEDICINE   Name: Julie Saunders MRN: [id number] DOB: October 25, 1986    ADMISSION DATE:  08/31/2014  REFERRING MD :  Dr. Jacelyn Grip   CHIEF COMPLAINT:  Cardiac Arrest    INITIAL PRESENTATION:  28 y/o female admitted 10/22 after VFib arrest at home and prolonged CPR (prolonged down time) when presenting to the ER.  Etiology felt to be hypokalemia from diarrhea.  To ICU for hypothermia protocol.  STUDIES:   10/16  Colonoscopy (Per [facility]) >> displays active duodenitis with areas of villous blunting/atrophy in addition to a small granuloma with multinucleated giant cells as seen in one of the fragments. Given the history of Crohn's disease, the changes are most compatible with involvement by the same disease process 10/22  CT head >> neg 10/22  CT cervical spine >> neg 10/22  ECHO >> RV normal, LVEF 20-25% 10/23 LE Doppler >>neg 10/24 ct>>Bowel wall thickening of terminal ileum and significant portions ofparticularly the distal colon compatible with enteritis, favor Crohn's disease in light of history.<Bibasilar effusions, atelectasis and suspected lower lobe  SIGNIFICANT EVENTS: 10/22  Admit after several days diarrhea, cardiac arrest at home with prolonged CPR 10/24- rewarmed, follows commands 10/25- improved levo major 10/26: weaned on PS all day 10/27: extubate  SUBJECTIVE: pressors down sig  VITAL SIGNS: Temp:  [98 F (36.7 C)-98.7 F (37.1 C)] 98.7 F (37.1 C) (10/27 0715) Pulse Rate:  [50-89] 88 (10/27 0800) Resp:  [10-27] 27 (10/27 0800) BP: (106-143)/(52-70) 130/70 mmHg (10/27 0735) SpO2:  [100 %] 100 % (10/27 0800) Arterial Line BP: (100-165)/(49-89) 156/84 mmHg (10/27 0800) FiO2 (%):  [30 %-40 %] 30 % (10/27 0735) Weight:  [84.9 kg (187 lb 2.7 oz)] 84.9 kg (187 lb 2.7 oz) (10/27 0500)  HEMODYNAMICS: CVP:  [9 mmHg-12 mmHg] 10 mmHg  VENTILATOR SETTINGS: Vent Mode:  [-] PSV;CPAP FiO2 (%):  [30 %-40 %] 30 % Set Rate:  [12 bmp] 12 bmp Vt Set:   [420 mL] 420 mL PEEP:  [5 cmH20] 5 cmH20 Pressure Support:  [5 cmH20-8 cmH20] 5 cmH20 Plateau Pressure:  [11 cmH20-14 cmH20] 12 cmH20  INTAKE / OUTPUT:  Intake/Output Summary (Last 24 hours) at 09/05/14 0916 Last data filed at 09/05/14 0800  Gross per 24 hour  Intake 1541.33 ml  Output   5100 ml  Net -3558.67 ml    PHYSICAL EXAMINATION: General:  No pain, awake follows commands HEENT: no pain PULM: diminshed at bases CV: RRR, no mgr AB: BS wnl, no r/g, soft, pads wnl Ext: good cap refil, color wnl  Neuro: alert, FSC MAE  LABS:  CBC  Recent Labs Lab 09/03/14 0335 09/04/14 0515 09/05/14 0400  WBC 34.8* 27.9* 14.3*  HGB 11.8* 10.0* 9.8*  HCT 32.5* 28.4* 28.5*  PLT 175 152 96*   Coag's  Recent Labs Lab 08/31/14 1219 08/31/14 1413 08/31/14 2015  APTT  --  32 33  INR 1.40 1.14 1.12   BMET  Recent Labs Lab 09/03/14 2325 09/04/14 0515 09/05/14 0400  NA 129* 129* 138  K 3.7 3.2* 3.9  CL 92* 93* 98  CO2 24 25 31   BUN 19 20 24*  CREATININE 2.44* 2.49* 2.83*  GLUCOSE 92 91 83   Electrolytes  Recent Labs Lab 08/31/14 1413  08/31/14 2121  09/03/14 0335  09/03/14 2325 09/04/14 0515 09/05/14 0400  CALCIUM  --   < >  --   < > 7.1*  < > 7.7* 7.8* 7.9*  MG 1.5  --  1.9  < > 2.1  --   --  2.2 2.1  PHOS 3.1  --  2.2*  --   --   --   --   --  3.6  < > = values in this interval not displayed. Sepsis Markers  Recent Labs Lab 08/31/14 1219 08/31/14 1233 09/01/14 1027  LATICACIDVEN 17.2* 15.78* 4.6*   ABG  Recent Labs Lab 09/03/14 1206 09/05/14 0442 09/05/14 0831  PHART 7.364 7.307* 7.418  PCO2ART 34.8* 51.0* 51.1*  PO2ART 176.0* 152.0* 154.0*   Liver Enzymes  Recent Labs Lab 09/02/14 0525 09/03/14 0335 09/04/14 0515  AST 69* 127* 211*  ALT 44* 59* 97*  ALKPHOS 68 88 66  BILITOT 0.4 0.6 0.5  ALBUMIN 2.1* 1.6* 1.5*   Cardiac Enzymes  Recent Labs Lab 08/31/14 1900 08/31/14 2328 09/01/14 0420  TROPONINI 1.27* 1.02* 0.97*    Glucose  Recent Labs Lab 09/04/14 1111 09/04/14 1643 09/04/14 1716 09/04/14 1932 09/05/14 0011 09/05/14 0343  GLUCAP 70 66* 94 90 83 88    Imaging 10/24CXR> ETT, cvl noted, diffuse int infiltrate rt greater left  ASSESSMENT / PLAN:  PULMONARY  OETT 10/22 >> 10/27 A:   Acute hypoxemic respiratory failure - in the setting of cardiac arrest, likely secondary to QTC from hypoK Bilateral basilar infiltrates / atx ABG on wean acceptable, SBI 43, nodding appropriate, no secretions P:   - Extubate - Titrate O2. - If fails then will reintubate. - IS per RT protocol.  CARDIOVASCULAR CVL L IJ 10/22 >> A:  VFib arrest - most likely hypokalemia  Shock > cardiogenic likely Prolonged QTc > resolved NSTEMI> stunned myocardium from  No evidence of rel AI Reduced EF, septic cardiomyoapthy? takasub? P:  - Cards consult - Rec EP eval and discussion of ICD and angio prior to DC. Hold beta blocker now for bradycardia - Anti-HTN as ordered.  RENAL A:    AKI- creatinine at 2.83, on admission 0.9 Hypokalemia Hypocalcemia Hyponatremia resolved since diuretics  P:   - Trend BMET and BMP - Replace electrolytes as indicated. - Lasix 40 mg IV q8 hours x2 hours. - Renal U/S.  GASTROINTESTINAL A:  Crohns - colonoscopy done 10/16 with multiple bx sites, neg for malignancy.  C/W Active Gastritis.   Diarrhea  Elevated Ammonia resolved, due to shock Protein Calorie Malnutrition admission albumin 1.8 P:   - SUP: pepcid - Hold home medications: pentasa, imodium - Will consider steroids if GI feels necessary but hold off for now. - NPO after extubation and holding for swallow eval  HEMATOLOGIC A:   dvt prevention Leukocytosis improving Anemia with no overt bleeding at this time P:  - Monitor CBC in am   - DVT: SCD's + heparin sq - LE doppler neg  INFECTIOUS A:   Diarrhea - Crohns- improving with minimal output today  P:   BCx2 10/22 >> Stool Culture 10/22 >>  negative Abx: Zosyn 10/22>>> vanc 10/24>>10/26 casp 10/24>>>10/25  ENDOCRINE A:   Hyperglycemia - likely stress response, no hx of DM P:   - SSI - No role stress steroids  NEUROLOGIC A:   Acute anoxic encephalopathy - post arrest  Now alert and oriented to all; slow to respond  P:   - RASS goal: 0 - Fent drip DC - Propofol drip DC - Fentanyl pushes prn as needed; careful with sedative medications  GLOBAL:    - Updates: Family updated at bedside 10/27, if fails extubation will reintubate and trach/peg.  TODAY'S SUMMARY:  Extubation and support, if fails then reintubate and trach/peg.   CC time 35 min.  Patient seen and examined, agree with above note.  I dictated the care and orders written for this patient under my direction.  Rush Farmer, MD [phone]

## 2014-09-05 NOTE — Progress Notes (Signed)
Subjective:  Day  #5 SCD/Artic Sun/VDRF. Intubated. Alert and follows commands  Objective:  Temp:  [98 F (36.7 C)-98.7 F (37.1 C)] 98.7 F (37.1 C) (10/27 0715) Pulse Rate:  [50-89] 63 (10/27 0735) Resp:  [10-24] 14 (10/27 0735) BP: (106-143)/(52-70) 130/70 mmHg (10/27 0735) SpO2:  [100 %] 100 % (10/27 0735) Arterial Line BP: (100-165)/(49-89) 133/62 mmHg (10/27 0700) FiO2 (%):  [30 %-40 %] 30 % (10/27 0735) Weight:  [187 lb 2.7 oz (84.9 kg)] 187 lb 2.7 oz (84.9 kg) (10/27 0500) Weight change: -16 lb 15.6 oz (-7.7 kg)  Intake/Output from previous day: 10/26 0701 - 10/27 0700 In: 1632.7 [I.V.:479.3; NG/GT:803.3; IV Piggyback:350] Out: 5300 [Urine:5200; Stool:100]  Intake/Output from this shift:    Physical Exam: General appearance: alert, no distress, mild distress and toxic Neck: no adenopathy, no carotid bruit, no JVD, supple, symmetrical, trachea midline and thyroid not enlarged, symmetric, no tenderness/mass/nodules Lungs: clear to auscultation bilaterally Heart: regular rate and rhythm, S1, S2 normal, no murmur, click, rub or gallop Extremities: edema No edema  Lab Results: Results for orders placed during the hospital encounter of 08/31/14 (from the past 48 hour(s))  BASIC METABOLIC PANEL     Status: Abnormal   Collection Time    09/03/14 10:35 AM      Result Value Ref Range   Sodium 129 (*) 137 - 147 mEq/L   Potassium 4.0  3.7 - 5.3 mEq/L   Chloride 93 (*) 96 - 112 mEq/L   CO2 21  19 - 32 mEq/L   Glucose, Bld 98  70 - 99 mg/dL   BUN 14  6 - 23 mg/dL   Creatinine, Ser 2.13 (*) 0.50 - 1.10 mg/dL   Calcium 7.1 (*) 8.4 - 10.5 mg/dL   GFR calc non Af Amer 31 (*) >90 mL/min   GFR calc Af Amer 36 (*) >90 mL/min   Comment: (NOTE)     The eGFR has been calculated using the CKD EPI equation.     This calculation has not been validated in all clinical situations.     eGFR's persistently <90 mL/min signify possible Chronic Kidney     Disease.   Anion gap 15   5 - 15  GLUCOSE, CAPILLARY     Status: Abnormal   Collection Time    09/03/14 11:45 AM      Result Value Ref Range   Glucose-Capillary 103 (*) 70 - 99 mg/dL   Comment 1 Capillary Sample    POCT I-STAT 3, ART BLOOD GAS (G3+)     Status: Abnormal   Collection Time    09/03/14 12:06 PM      Result Value Ref Range   pH, Arterial 7.364  7.350 - 7.450   pCO2 arterial 34.8 (*) 35.0 - 45.0 mmHg   pO2, Arterial 176.0 (*) 80.0 - 100.0 mmHg   Bicarbonate 19.9 (*) 20.0 - 24.0 mEq/L   TCO2 21  0 - 100 mmol/L   O2 Saturation 100.0     Acid-base deficit 5.0 (*) 0.0 - 2.0 mmol/L   Patient temperature 36.9 C     Collection site RADIAL, ALLEN'S TEST ACCEPTABLE     Drawn by VENIPUNCTURE     Sample type ARTERIAL    GLUCOSE, CAPILLARY     Status: None   Collection Time    09/03/14  9:05 PM      Result Value Ref Range   Glucose-Capillary 85  70 - 99 mg/dL  BASIC METABOLIC PANEL  Status: Abnormal   Collection Time    09/03/14 11:25 PM      Result Value Ref Range   Sodium 129 (*) 137 - 147 mEq/L   Potassium 3.7  3.7 - 5.3 mEq/L   Chloride 92 (*) 96 - 112 mEq/L   CO2 24  19 - 32 mEq/L   Glucose, Bld 92  70 - 99 mg/dL   BUN 19  6 - 23 mg/dL   Creatinine, Ser 2.44 (*) 0.50 - 1.10 mg/dL   Calcium 7.7 (*) 8.4 - 10.5 mg/dL   GFR calc non Af Amer 26 (*) >90 mL/min   GFR calc Af Amer 30 (*) >90 mL/min   Comment: (NOTE)     The eGFR has been calculated using the CKD EPI equation.     This calculation has not been validated in all clinical situations.     eGFR's persistently <90 mL/min signify possible Chronic Kidney     Disease.   Anion gap 13  5 - 15  GLUCOSE, CAPILLARY     Status: None   Collection Time    09/03/14 11:28 PM      Result Value Ref Range   Glucose-Capillary 93  70 - 99 mg/dL  GLUCOSE, CAPILLARY     Status: None   Collection Time    09/04/14  5:14 AM      Result Value Ref Range   Glucose-Capillary 93  70 - 99 mg/dL   Comment 1 Arterial Sample    MAGNESIUM     Status: None     Collection Time    09/04/14  5:15 AM      Result Value Ref Range   Magnesium 2.2  1.5 - 2.5 mg/dL  COMPREHENSIVE METABOLIC PANEL     Status: Abnormal   Collection Time    09/04/14  5:15 AM      Result Value Ref Range   Sodium 129 (*) 137 - 147 mEq/L   Potassium 3.2 (*) 3.7 - 5.3 mEq/L   Chloride 93 (*) 96 - 112 mEq/L   CO2 25  19 - 32 mEq/L   Glucose, Bld 91  70 - 99 mg/dL   BUN 20  6 - 23 mg/dL   Creatinine, Ser 2.49 (*) 0.50 - 1.10 mg/dL   Calcium 7.8 (*) 8.4 - 10.5 mg/dL   Total Protein 4.2 (*) 6.0 - 8.3 g/dL   Albumin 1.5 (*) 3.5 - 5.2 g/dL   AST 211 (*) 0 - 37 U/L   ALT 97 (*) 0 - 35 U/L   Alkaline Phosphatase 66  39 - 117 U/L   Total Bilirubin 0.5  0.3 - 1.2 mg/dL   GFR calc non Af Amer 25 (*) >90 mL/min   GFR calc Af Amer 29 (*) >90 mL/min   Comment: (NOTE)     The eGFR has been calculated using the CKD EPI equation.     This calculation has not been validated in all clinical situations.     eGFR's persistently <90 mL/min signify possible Chronic Kidney     Disease.   Anion gap 11  5 - 15  CBC WITH DIFFERENTIAL     Status: Abnormal   Collection Time    09/04/14  5:15 AM      Result Value Ref Range   WBC 27.9 (*) 4.0 - 10.5 K/uL   RBC 3.35 (*) 3.87 - 5.11 MIL/uL   Hemoglobin 10.0 (*) 12.0 - 15.0 g/dL   HCT 28.4 (*) 36.0 -  46.0 %   MCV 84.8  78.0 - 100.0 fL   MCH 29.9  26.0 - 34.0 pg   MCHC 35.2  30.0 - 36.0 g/dL   RDW 14.5  11.5 - 15.5 %   Platelets 152  150 - 400 K/uL   Neutrophils Relative % 84 (*) 43 - 77 %   Lymphocytes Relative 10 (*) 12 - 46 %   Monocytes Relative 6  3 - 12 %   Eosinophils Relative 0  0 - 5 %   Basophils Relative 0  0 - 1 %   Neutro Abs 23.4 (*) 1.7 - 7.7 K/uL   Lymphs Abs 2.8  0.7 - 4.0 K/uL   Monocytes Absolute 1.7 (*) 0.1 - 1.0 K/uL   Eosinophils Absolute 0.0  0.0 - 0.7 K/uL   Basophils Absolute 0.0  0.0 - 0.1 K/uL   RBC Morphology BURR CELLS     WBC Morphology TOXIC GRANULATION    GLUCOSE, CAPILLARY     Status: None    Collection Time    09/04/14  7:22 AM      Result Value Ref Range   Glucose-Capillary 87  70 - 99 mg/dL   Comment 1 Capillary Sample    GLUCOSE, CAPILLARY     Status: None   Collection Time    09/04/14 11:11 AM      Result Value Ref Range   Glucose-Capillary 70  70 - 99 mg/dL   Comment 1 Capillary Sample    VANCOMYCIN, TROUGH     Status: Abnormal   Collection Time    09/04/14 11:20 AM      Result Value Ref Range   Vancomycin Tr 38.2 (*) 10.0 - 20.0 ug/mL   Comment: CRITICAL RESULT CALLED TO, READ BACK BY AND VERIFIED WITH:     T.OLEARY,RN 1240 09/04/14 CLARK,S  GLUCOSE, CAPILLARY     Status: Abnormal   Collection Time    09/04/14  4:43 PM      Result Value Ref Range   Glucose-Capillary 66 (*) 70 - 99 mg/dL   Comment 1 Capillary Sample    GLUCOSE, CAPILLARY     Status: None   Collection Time    09/04/14  5:16 PM      Result Value Ref Range   Glucose-Capillary 94  70 - 99 mg/dL  GLUCOSE, CAPILLARY     Status: None   Collection Time    09/04/14  7:32 PM      Result Value Ref Range   Glucose-Capillary 90  70 - 99 mg/dL   Comment 1 Capillary Sample    GLUCOSE, CAPILLARY     Status: None   Collection Time    09/05/14 12:11 AM      Result Value Ref Range   Glucose-Capillary 83  70 - 99 mg/dL   Comment 1 Arterial Sample    GLUCOSE, CAPILLARY     Status: None   Collection Time    09/05/14  3:43 AM      Result Value Ref Range   Glucose-Capillary 88  70 - 99 mg/dL  MAGNESIUM     Status: None   Collection Time    09/05/14  4:00 AM      Result Value Ref Range   Magnesium 2.1  1.5 - 2.5 mg/dL  CBC     Status: Abnormal   Collection Time    09/05/14  4:00 AM      Result Value Ref Range   WBC 14.3 (*) 4.0 -  10.5 K/uL   RBC 3.34 (*) 3.87 - 5.11 MIL/uL   Hemoglobin 9.8 (*) 12.0 - 15.0 g/dL   HCT 28.5 (*) 36.0 - 46.0 %   MCV 85.3  78.0 - 100.0 fL   MCH 29.3  26.0 - 34.0 pg   MCHC 34.4  30.0 - 36.0 g/dL   RDW 14.4  11.5 - 15.5 %   Platelets 96 (*) 150 - 400 K/uL   Comment:  PLATELET COUNT CONFIRMED BY SMEAR  BASIC METABOLIC PANEL     Status: Abnormal   Collection Time    09/05/14  4:00 AM      Result Value Ref Range   Sodium 138  137 - 147 mEq/L   Comment: DELTA CHECK NOTED   Potassium 3.9  3.7 - 5.3 mEq/L   Comment: DELTA CHECK NOTED     NO VISIBLE HEMOLYSIS   Chloride 98  96 - 112 mEq/L   CO2 31  19 - 32 mEq/L   Glucose, Bld 83  70 - 99 mg/dL   BUN 24 (*) 6 - 23 mg/dL   Creatinine, Ser 2.83 (*) 0.50 - 1.10 mg/dL   Calcium 7.9 (*) 8.4 - 10.5 mg/dL   GFR calc non Af Amer 22 (*) >90 mL/min   GFR calc Af Amer 25 (*) >90 mL/min   Comment: (NOTE)     The eGFR has been calculated using the CKD EPI equation.     This calculation has not been validated in all clinical situations.     eGFR's persistently <90 mL/min signify possible Chronic Kidney     Disease.   Anion gap 9  5 - 15  PHOSPHORUS     Status: None   Collection Time    09/05/14  4:00 AM      Result Value Ref Range   Phosphorus 3.6  2.3 - 4.6 mg/dL  BLOOD GAS, ARTERIAL     Status: Abnormal   Collection Time    09/05/14  4:42 AM      Result Value Ref Range   FIO2 0.40     Delivery systems VENTILATOR     Mode PRESSURE REGULATED VOLUME CONTROL     VT 420     Rate 12     Peep/cpap 5.0     pH, Arterial 7.307 (*) 7.350 - 7.450   pCO2 arterial 51.0 (*) 35.0 - 45.0 mmHg   pO2, Arterial 152.0 (*) 80.0 - 100.0 mmHg   Bicarbonate 24.7 (*) 20.0 - 24.0 mEq/L   TCO2 26.3  0 - 100 mmol/L   Acid-base deficit 0.8  0.0 - 2.0 mmol/L   O2 Saturation 98.8     Patient temperature 98.5     Collection site A-LINE     Drawn by [phone]     Sample type ARTERIAL DRAW     Allens test (pass/fail) PASS  PASS    Imaging: Imaging results have been reviewed  Tele: SR 60s  CVP: 9-10 mm HG  Assessment/Plan:   1. Active Problems: 2.   Cardiac arrest 3.   Hypokalemia 4.   Acute respiratory failure with hypoxia 5.   Acute encephalopathy 6. LV dysfunction 7. Long QT 8. Acute Renal failure/ ATN 9. Anoxic  Encephalopathy  Time Spent Directly with Patient:  30 minutes  Length of Stay:  LOS: 5 days   !Marland Kitchen SCD- Pt is Day #5 SCD/ Artic Sun, LV dysfunction by 2D (EF 30%). Trop level low. Suspect stunned myocardium. VSS. EKG shows new AL  TWI worrisome for ischemia vs metabolic. Will need EP to eval and discuss ICD Rx. Will probably require cor angio prior to D/C and or consideration of ICD placement. No BB for now given relative bradycardia.   2. VDRF- Per PCCM, hopefully will be extubatable.today. Getting weaned now. Pt is off of sedation.  3. Anoxic encephalopathy- Despite 30 minutes of down time, the patient is alert and follows commands. Continue to follow and evaluate  4. Acute renal failure/ATN- SCr 2.8. Was 1.07 on admission. Suspect it will return to near baseline.  Good diuresis to IV lasix. I/O total up 7 liters since admission. CXR relatively clear.  Lorretta Harp 09/05/2014, 8:21 AM

## 2014-09-05 NOTE — Progress Notes (Signed)
fANTIBIOTIC CONSULT NOTE - INITIAL  Pharmacy Consult for Zosyn Indication: r/o abdominal infection/sepsis  Allergies  Allergen Reactions  . Sulfa Antibiotics     Patient Measurements: Height: 5' 6"  (167.6 cm) Weight: 187 lb 2.7 oz (84.9 kg) IBW/kg (Calculated) : 59.3 Vital Signs: Temp: 98.7 F (37.1 C) (10/27 0715) Temp Source: Oral (10/27 0715) BP: 130/70 mmHg (10/27 0735) Pulse Rate: 63 (10/27 0735) Intake/Output from previous day: 10/26 0701 - 10/27 0700 In: 1632.7 [I.V.:479.3; NG/GT:803.3; IV Piggyback:350] Out: 5300 [Urine:5200; Stool:100] Intake/Output from this shift:    Labs:  Recent Labs  09/03/14 0335  09/03/14 2325 09/04/14 0515 09/05/14 0400  WBC 34.8*  --   --  27.9* 14.3*  HGB 11.8*  --   --  10.0* 9.8*  PLT 175  --   --  152 96*  CREATININE 2.00*  < > 2.44* 2.49* 2.83*  < > = values in this interval not displayed. Estimated Creatinine Clearance: 32.8 ml/min (by C-G formula based on Cr of 2.83).  Recent Labs  09/04/14 1120  [place] 38.2*     Microbiology: Recent Results (from the past 720 hour(s))  MRSA PCR SCREENING     Status: None   Collection Time    08/31/14  3:52 PM      Result Value Ref Range Status   MRSA by PCR NEGATIVE  NEGATIVE Final   Comment:            The GeneXpert MRSA Assay (FDA     approved for NASAL specimens     only), is one component of a     comprehensive MRSA colonization     surveillance program. It is not     intended to diagnose MRSA     infection nor to guide or     monitor treatment for     MRSA infections.  STOOL CULTURE     Status: None   Collection Time    08/31/14 10:00 PM      Result Value Ref Range Status   Specimen Description STOOL   Final   Special Requests NONE   Final   Culture     Final   Value: NO SALMONELLA, SHIGELLA, CAMPYLOBACTER, YERSINIA, OR E.COLI 0157:H7 ISOLATED     Performed at [facility]   Report Status 09/04/2014 FINAL   Final  CLOSTRIDIUM DIFFICILE BY PCR      Status: None   Collection Time    09/01/14  2:37 AM      Result Value Ref Range Status   C difficile by pcr NEGATIVE  NEGATIVE Final    Medical History: Past Medical History  Diagnosis Date  . Crohn disease   . Migraine   . Allergy   . Pancytopenia 11/23/2012    Medications:  Anti-infectives   Start     Dose/Rate Route Frequency Ordered Stop   09/02/14 2300  vancomycin (VANCOCIN) IVPB 750 mg/150 ml premix  Status:  Discontinued     750 mg 150 mL/hr over 60 Minutes Intravenous Every 12 hours 09/02/14 1026 09/04/14 0755   09/02/14 1130  micafungin (MYCAMINE) 100 mg in sodium chloride 0.9 % 100 mL IVPB  Status:  Discontinued     100 mg 100 mL/hr over 1 Hours Intravenous Every 24 hours 09/02/14 1014 09/03/14 1001   09/02/14 1100  vancomycin (VANCOCIN) 1,250 mg in sodium chloride 0.9 % 250 mL IVPB     1,250 mg 166.7 mL/hr over 90 Minutes Intravenous  Once 09/02/14 1026 09/02/14 1340  09/02/14 1015  micafungin (MYCAMINE) 100 mg in sodium chloride 0.9 % 100 mL IVPB  Status:  Discontinued     100 mg 100 mL/hr over 1 Hours Intravenous Daily 09/02/14 1014 09/02/14 1015   09/01/14 1600  fluconazole (DIFLUCAN) IVPB 400 mg  Status:  Discontinued     400 mg 100 mL/hr over 120 Minutes Intravenous Every 24 hours 08/31/14 1556 08/31/14 1701   08/31/14 2200  piperacillin-tazobactam (ZOSYN) IVPB 3.375 g     3.375 g 12.5 mL/hr over 240 Minutes Intravenous 3 times per day 08/31/14 1556     08/31/14 1600  piperacillin-tazobactam (ZOSYN) IVPB 3.375 g     3.375 g 100 mL/hr over 30 Minutes Intravenous  Once 08/31/14 1550 08/31/14 1902   08/31/14 1600  fluconazole (DIFLUCAN) IVPB 800 mg  Status:  Discontinued     800 mg 100 mL/hr over 240 Minutes Intravenous  Once 08/31/14 1556 08/31/14 1701     Assessment: 28 year old female admitted 08/31/14 after a collapse at home and subsequent cardiac arrest with prolonged CPR initiated on hypothermia protocol to start IV abx per pharmacy for r/o abdominal  infection. Notable PMH includes crohn's disease.   Patient is now rewarmed, WBC has now trended down to 14.3 (now off Solu-Cortef), tmax 98.7. Day #6 Zosyn, now off vanc and micafungin. SCr continues to trend up to 2.83.  10/22 Zosyn>> 10/24 Vanc>> Vanc level= 38.2. vanc d/c 10/26 per MD 10/24 micafungin>>10/25  Stool Culture 10/22 >> NEG Cdiff PCR 10/23>>NEG Stool Cx 10/26>>  Goal of Therapy:  Clinical resolution of infection  Plan:  - Continue Zosyn 3.375 gm IV q8h (4 hr infusion) - Monitor renal function, temp, WBC, C&S, LOT  Harolyn Rutherford, PharmD Clinical Pharmacist - Resident Pager: [phone] Pharmacy: [phone] 09/05/2014 8:19 AM

## 2014-09-05 NOTE — Progress Notes (Signed)
150 mL of fentanyl wasted in sink with Paula Compton, Therapist, sports. Roxan Hockey, RN

## 2014-09-05 NOTE — Progress Notes (Signed)
ET tube had become dislodged to 19cm.  ET tube was pushed back to 23 cm. PT has a strong cough and keeps coughing and this is the reason for dislodge.  RT is aware and will continue to monitor til extubation.

## 2014-09-05 NOTE — Progress Notes (Signed)
[facility] PROGRESS NOTE Subjective Pt extubated and alert. Swallowing study is pending Dad in room and long talk with him Pt was having her baseline diarrhea after the colon but felt well and went to a festival outdoors over the weekend w/o problems with diarrhea and was eating and drinking well. Arrest occurred 1 week later. K in office before the colon was 4.5., was 2.6 on admission to ER  Objective: Vital signs in last 24 hours: Temp:  [98.3 F (36.8 C)-98.7 F (37.1 C)] 98.3 F (36.8 C) (10/27 1127) Pulse Rate:  [50-89] 73 (10/27 1200) Resp:  [10-31] 31 (10/27 1200) BP: (130-143)/(65-70) 136/70 mmHg (10/27 1127) SpO2:  [96 %-100 %] 96 % (10/27 1200) Arterial Line BP: (116-165)/(54-89) 139/71 mmHg (10/27 1200) FiO2 (%):  [30 %-40 %] 30 % (10/27 0735) Weight:  [84.9 kg (187 lb 2.7 oz)] 84.9 kg (187 lb 2.7 oz) (10/27 0500) Last BM Date: 09/04/14  Intake/Output from previous day: 10/26 0701 - 10/27 0700 In: 1632.7 [I.V.:479.3; NG/GT:803.3; IV Piggyback:350] Out: 5300 [Urine:5200; Stool:100] Intake/Output this shift: Total I/O In: 191.5 [I.V.:51.5; NG/GT:90; IV Piggyback:50] Out: 725 [Urine:725]  PE: General--alert oriented answers questions.  Liquid stool in bag  Lungs--clear Abdomen--soft and nontender  Lab Results:  Recent Labs  09/03/14 0335 09/04/14 0515 09/05/14 0400  WBC 34.8* 27.9* 14.3*  HGB 11.8* 10.0* 9.8*  HCT 32.5* 28.4* 28.5*  PLT 175 152 96*   BMET  Recent Labs  09/03/14 0335 09/03/14 1035 09/03/14 2325 09/04/14 0515 09/05/14 0400  NA 128* 129* 129* 129* 138  K 4.3 4.0 3.7 3.2* 3.9  CL 92* 93* 92* 93* 98  CO2 18* 21 24 25 31   CREATININE 2.00* 2.13* 2.44* 2.49* 2.83*   LFT  Recent Labs  09/03/14 0335 09/04/14 0515  PROT 4.4* 4.2*  AST 127* 211*  ALT 59* 97*  ALKPHOS 88 66  BILITOT 0.6 0.5   PT/INR No results found for this basename: LABPROT, INR,  in the last 72 hours PANCREAS No results found for this basename:  LIPASE,  in the last 72 hours       Studies/Results: Dg Chest Port 1 View  09/05/2014   CLINICAL DATA:  Hypoxia  EXAM: PORTABLE CHEST - 1 VIEW  COMPARISON:  September 04, 2014  FINDINGS: Endotracheal tube tip is 6.5 cm above the carina. Central catheter tip is at the cavoatrial junction level. Nasogastric tube tip and side port are below the diaphragm. No pneumothorax. There is persistent left lower lobe consolidation. Right lung is clear. Heart is upper normal in size with pulmonary vascularity within normal limits. No adenopathy. No bone lesions.  IMPRESSION: Tube and catheter positions as described without pneumothorax. Persistent left lower lobe consolidation. No new opacity. No change in cardiac silhouette.   Electronically Signed   By: Lowella Grip M.D.   On: 09/05/2014 07:30   Dg Chest Port 1 View  09/04/2014   CLINICAL DATA:  Cardiac arrest.  Assess support apparatus.  EXAM: PORTABLE CHEST - 1 VIEW  COMPARISON:  09/03/2014.  FINDINGS: Support apparatus: Unchanged.  Cardiomediastinal Silhouette:  Unchanged.  Lungs: Slight improvement in bilateral airspace disease compatible with pulmonary edema. Some of the airspace disease has shifted. More prominent basilar atelectasis. No pneumothorax.  Effusions:  None.  Other: LEFT costophrenic angle excluded from view. Defibrillator pads remain present.  IMPRESSION: 1. Unchanged support apparatus. 2. Shifting airspace disease compatible with pulmonary edema.   Electronically Signed   By: Dereck Ligas M.D.   On:  09/04/2014 07:54    Medications: I have reviewed the patient's current medications.  Assessment/Plan: 1. Diarrhea. Crohns of TI and patchy in colon. Will need to start back on pentasa and? Prednisone if swallowing  Ok. ? If the colon prep whole reason for low K in view of fathers history about week after the colon. ? If something else may be contributing.   [name],Gilma Bessette L 09/05/2014, 12:17 PM

## 2014-09-06 DIAGNOSIS — D61818 Other pancytopenia: Secondary | ICD-10-CM

## 2014-09-06 DIAGNOSIS — N19 Unspecified kidney failure: Secondary | ICD-10-CM

## 2014-09-06 LAB — GLUCOSE, CAPILLARY
GLUCOSE-CAPILLARY: 68 mg/dL — AB (ref 70–99)
GLUCOSE-CAPILLARY: 101 mg/dL — AB (ref 70–99)
GLUCOSE-CAPILLARY: 75 mg/dL (ref 70–99)
GLUCOSE-CAPILLARY: 80 mg/dL (ref 70–99)
GLUCOSE-CAPILLARY: 87 mg/dL (ref 70–99)
Glucose-Capillary: 65 mg/dL — ABNORMAL LOW (ref 70–99)
Glucose-Capillary: 115 mg/dL — ABNORMAL HIGH (ref 70–99)

## 2014-09-06 LAB — PHOSPHORUS: Phosphorus: 3.5 mg/dL (ref 2.3–4.6)

## 2014-09-06 LAB — CBC
HCT: 27.2 % — ABNORMAL LOW (ref 36.0–46.0)
Hemoglobin: 9.3 g/dL — ABNORMAL LOW (ref 12.0–15.0)
MCH: 29.7 pg (ref 26.0–34.0)
MCHC: 34.2 g/dL (ref 30.0–36.0)
MCV: 86.9 fL (ref 78.0–100.0)
PLATELETS: 99 10*3/uL — AB (ref 150–400)
RBC: 3.13 MIL/uL — ABNORMAL LOW (ref 3.87–5.11)
RDW: 14.5 % (ref 11.5–15.5)
WBC: 9.4 10*3/uL (ref 4.0–10.5)

## 2014-09-06 LAB — BASIC METABOLIC PANEL
Anion gap: 12 (ref 5–15)
BUN: 25 mg/dL — ABNORMAL HIGH (ref 6–23)
CALCIUM: 8.2 mg/dL — AB (ref 8.4–10.5)
CO2: 34 mEq/L — ABNORMAL HIGH (ref 19–32)
Chloride: 98 mEq/L (ref 96–112)
Creatinine, Ser: 2.93 mg/dL — ABNORMAL HIGH (ref 0.50–1.10)
GFR calc Af Amer: 24 mL/min — ABNORMAL LOW (ref >90)
GFR, EST NON AFRICAN AMERICAN: 21 mL/min — AB (ref >90)
Glucose, Bld: 71 mg/dL (ref 70–99)
Potassium: 3.5 mEq/L — ABNORMAL LOW (ref 3.7–5.3)
SODIUM: 144 meq/L (ref 137–147)

## 2014-09-06 LAB — PRO B NATRIURETIC PEPTIDE: PRO B NATRI PEPTIDE: 43778 pg/mL — AB (ref 0–125)

## 2014-09-06 LAB — MAGNESIUM: Magnesium: 2 mg/dL (ref 1.5–2.5)

## 2014-09-06 MED ORDER — METHYLPREDNISOLONE SODIUM SUCC 40 MG IJ SOLR
40.0000 mg | Freq: Two times a day (BID) | INTRAMUSCULAR | Status: DC
  Administered 2014-09-06 – 2014-09-10 (×8): 40 mg via INTRAVENOUS
  Filled 2014-09-06 (×10): qty 1

## 2014-09-06 MED ORDER — POTASSIUM CHLORIDE 10 MEQ/50ML IV SOLN
10.0000 meq | INTRAVENOUS | Status: AC
  Administered 2014-09-06 (×2): 10 meq via INTRAVENOUS
  Filled 2014-09-06 (×2): qty 50

## 2014-09-06 MED ORDER — DEXTROSE 50 % IV SOLN
25.0000 mL | Freq: Once | INTRAVENOUS | Status: AC | PRN
  Administered 2014-09-06: 25 mL via INTRAVENOUS

## 2014-09-06 MED ORDER — DEXTROSE 50 % IV SOLN
INTRAVENOUS | Status: AC
  Filled 2014-09-06: qty 50

## 2014-09-06 MED ORDER — RESOURCE THICKENUP CLEAR PO POWD
ORAL | Status: DC | PRN
  Filled 2014-09-06: qty 125

## 2014-09-06 MED ORDER — DEXTROSE 50 % IV SOLN
25.0000 mL | Freq: Once | INTRAVENOUS | Status: AC | PRN
  Administered 2014-09-06: 25 mL via INTRAVENOUS
  Filled 2014-09-06: qty 50

## 2014-09-06 NOTE — Evaluation (Signed)
Physical Therapy Evaluation Patient Details Name: Julie Saunders MRN: [id number] DOB: [birth year] Today's Date: 09/06/2014   History of Present Illness  Pt adm after cardiac arrest at home likely due to low K due to diarrhea. Pt with prolonged down time. Hypothermia protocol. Pt extubated 10/27. Pt with history of Crohns.  Clinical Impression  Pt admitted with above. Pt currently with functional limitations due to the deficits listed below (see PT Problem List).  Pt will benefit from skilled PT to increase their independence and safety with mobility to allow discharge to the venue listed below. Pt with significant motor and cognitive symptoms after extended down time. Feel pt will need extensive rehab. Pt with great difficulty using strength in a functional manner.     Follow Up Recommendations CIR    Equipment Recommendations  Wheelchair (measurements PT);Wheelchair cushion (measurements PT)    Recommendations for Other Services Rehab consult;OT consult     Precautions / Restrictions Precautions Precautions: Fall      Mobility  Bed Mobility Overal bed mobility: Needs Assistance Bed Mobility: Rolling;Supine to Sit;Sit to Sidelying Rolling: Mod assist   Supine to sit: +2 for physical assistance;Max assist   Sit to sidelying: +2 for physical assistance;Max assist General bed mobility comments: Verbal/tactile cues to reach with UE for rolling. Assist to manage trunk and legs.  Transfers                 General transfer comment: Bed to chair with maximove.  Ambulation/Gait                Stairs            Wheelchair Mobility    Modified Rankin (Stroke Patients Only)       Balance Overall balance assessment: Needs assistance Sitting-balance support: Feet supported Sitting balance-Leahy Scale: Zero Sitting balance - Comments: Pt sat EOB x 12-15 minutes with max A. Pt required max A to hold head upright. Not able to use arms functionally in sitting  to support self or assist with balance. Postural control: Posterior lean;Right lateral lean;Left lateral lean                                   Pertinent Vitals/Pain Pain Assessment: No/denies pain    Home Living Family/patient expects to be discharged to:: Private residence Living Arrangements: Parent Available Help at Discharge: Family Type of Home: Mobile home Home Access: Stairs to enter Entrance Stairs-Rails: Right Entrance Stairs-Number of Steps: 3-4 Home Layout: One level Home Equipment: None      Prior Function Level of Independence: Independent               Hand Dominance   Dominant Hand: Left    Extremity/Trunk Assessment   Upper Extremity Assessment: Defer to OT evaluation           Lower Extremity Assessment: RLE deficits/detail;LLE deficits/detail RLE Deficits / Details: Strength grossly 3/5 but very uncoordinated and delayed movements to command. LLE Deficits / Details: Strength grossly 3/5 but very uncoordinated and delayed movements to command.  Cervical / Trunk Assessment: Other exceptions  Communication      Cognition Arousal/Alertness: Awake/alert Behavior During Therapy: WFL for tasks assessed/performed Overall Cognitive Status: Impaired/Different from baseline Area of Impairment: Orientation;Attention;Following commands;Safety/judgement;Awareness;Problem solving Orientation Level: Disoriented to;Time;Situation Current Attention Level: Sustained Memory: Decreased short-term memory Following Commands: Follows one step commands inconsistently (requires tactile cuing) Safety/Judgement: Decreased awareness of safety;Decreased awareness  of deficits   Problem Solving: Slow processing;Decreased initiation;Difficulty sequencing;Requires verbal cues;Requires tactile cues      General Comments      Exercises        Assessment/Plan    PT Assessment Patient needs continued PT services  PT Diagnosis Generalized  weakness;Altered mental status;Difficulty walking   PT Problem List Decreased strength;Decreased activity tolerance;Decreased balance;Decreased mobility;Decreased coordination;Decreased cognition;Decreased knowledge of use of DME;Decreased safety awareness;Decreased knowledge of precautions;Impaired tone  PT Treatment Interventions DME instruction;Functional mobility training;Therapeutic activities;Therapeutic exercise;Wheelchair mobility training;Patient/family education;Cognitive remediation;Neuromuscular re-education;Balance training   PT Goals (Current goals can be found in the Care Plan section) Acute Rehab PT Goals Patient Stated Goal: Pt wants to go home. PT Goal Formulation: With patient/family Time For Goal Achievement: 09/20/14 Potential to Achieve Goals: Fair    Frequency Min 3X/week   Barriers to discharge        Co-evaluation               End of Session Equipment Utilized During Treatment: Other (comment) (maximove) Activity Tolerance: Patient tolerated treatment well Patient left: in chair;with call bell/phone within reach;with family/visitor present Nurse Communication: Mobility status;Need for lift equipment         Time: [phone] PT Time Calculation (min): 40 min   Charges:   PT Evaluation $Initial PT Evaluation Tier I: 1 Procedure PT Treatments $Therapeutic Activity: 23-37 mins   PT G Codes:          Aila Terra 10-06-2014, 11:36 AM  Suanne Marker PT [phone]

## 2014-09-06 NOTE — Progress Notes (Signed)
NUTRITION FOLLOW UP  Intervention:   Magic cup TID with meals, each supplement provides 290 kcal and 9 grams of protein  Nutrition Dx:   Inadequate oral intake now related to cognition and dysphagia as evidenced by dysphagia diet; ongoing.    Goal:   Pt to meet >/= 90% of their estimated nutrition needs; not met.    Monitor:   Weight trend, PO intake, supplement acceptance  Assessment:   28 y/o female admitted 10/22 after collapse with subsequent cardiac arrest at home and prolonged CPR (prolonged down time).  Per MD notes Vfib arrest likely due to diarrhea with resulting hypokalemia. s/p hypothermia protocol.  Pt extubated 10/27. Seen by SLP today (10/28) and starting a PO diet.  Potassium low  Spoke with pt and her dad. Per dad pt had lost a little weight and had a decreased appetite right before admission due to a Chrohn's flare.  Per pt she usually tolerates milk and ice cream.   Height: Ht Readings from Last 1 Encounters:  09/01/14 5' 6" (1.676 m)    Weight Status:   Wt Readings from Last 1 Encounters:  09/06/14 174 lb 13.2 oz (79.3 kg)  Admission weight: 169 lb (77 kg) 10/22  Re-estimated needs:  Kcal: 1700-1900 Protein: 80-100 grams Fluid: >1.8 L  Skin: Intact  Diet Order: Dysphagia 2 with Nectar Thickened Liquids   Intake/Output Summary (Last 24 hours) at 09/06/14 1055 Last data filed at 09/06/14 1000  Gross per 24 hour  Intake    450 ml  Output   3965 ml  Net  -3515 ml    Last BM: rectal pouch inserted 10/22 10/27: no documentation 10/26: 100 ml  10/25: 700 ml  Labs:   Recent Labs Lab 08/31/14 2121  09/04/14 0515 09/05/14 0400 09/06/14 0411  NA  --   < > 129* 138 144  K  --   < > 3.2* 3.9 3.5*  CL  --   < > 93* 98 98  CO2  --   < > 25 31 34*  BUN  --   < > 20 24* 25*  CREATININE  --   < > 2.49* 2.83* 2.93*  CALCIUM  --   < > 7.8* 7.9* 8.2*  MG 1.9  < > 2.2 2.1 2.0  PHOS 2.2*  --   --  3.6 3.5  GLUCOSE  --   < > 91 83 71  < > = values  in this interval not displayed.  CBG (last 3)   Recent Labs  09/06/14 0437 09/06/14 0822 09/06/14 0918  GLUCAP 101* 65* 75    Scheduled Meds: . antiseptic oral rinse  7 mL Mouth Rinse BID  . famotidine (PEPCID) IV  20 mg Intravenous Q24H  . heparin subcutaneous  5,000 Units Subcutaneous 3 times per day  . piperacillin-tazobactam (ZOSYN)  IV  3.375 g Intravenous 3 times per day  . sodium chloride  10-40 mL Intracatheter Q12H    Continuous Infusions: . sodium chloride Stopped (09/05/14 1600)  . sodium chloride Stopped (09/02/14 1152)    [place], [place], CNSC [phone] Pager [phone] After Hours Pager

## 2014-09-06 NOTE — Progress Notes (Signed)
Rehab Admissions Coordinator Note:  Patient was screened by Cleatrice Burke for appropriateness for an Inpatient Acute Rehab Consult per PT recommendation.   At this time, we are recommending Inpatient Rehab consult. For insurance authorization, we will also need SLP cognition evaluation as well as OT eval. Please place orders. Thanks  Cleatrice Burke 09/06/2014, 2:21 PM  I can be reached at [phone].

## 2014-09-06 NOTE — Progress Notes (Signed)
Subjective:  Day # 7 SCD/ Artic Sun/ VDRF-----> Extubated yesterday. Alert and oriented. Follows commands  Objective:  Temp:  [98.3 F (36.8 C)-98.9 F (37.2 C)] 98.3 F (36.8 C) (10/28 0800) Pulse Rate:  [43-88] 49 (10/28 0800) Resp:  [10-22] 14 (10/28 0800) BP: (93-136)/(46-71) 109/56 mmHg (10/28 0800) SpO2:  [94 %-100 %] 98 % (10/28 0800) Arterial Line BP: (133-151)/(62-80) 139/69 mmHg (10/27 1600) Weight:  [174 lb 13.2 oz (79.3 kg)] 174 lb 13.2 oz (79.3 kg) (10/28 0500) Weight change: -12 lb 5.5 oz (-5.6 kg)  Intake/Output from previous day: 10/27 0701 - 10/28 0700 In: 461.5 [I.V.:121.5; NG/GT:90; IV Piggyback:250] Out: 4656 [Urine:4125]  Intake/Output from this shift: Total I/O In: 50 [IV Piggyback:50] Out: 15 [Urine:15]  Physical Exam: General appearance: alert, no distress and slowed mentation Neck: no adenopathy, no carotid bruit, no JVD, supple, symmetrical, trachea midline and thyroid not enlarged, symmetric, no tenderness/mass/nodules Lungs: clear to auscultation bilaterally Heart: regular rate and rhythm, S1, S2 normal, no murmur, click, rub or gallop Extremities: extremities normal, atraumatic, no cyanosis or edema  Lab Results: Results for orders placed during the hospital encounter of 08/31/14 (from the past 48 hour(s))  GLUCOSE, CAPILLARY     Status: None   Collection Time    09/04/14 11:11 AM      Result Value Ref Range   Glucose-Capillary 70  70 - 99 mg/dL   Comment 1 Capillary Sample    VANCOMYCIN, TROUGH     Status: Abnormal   Collection Time    09/04/14 11:20 AM      Result Value Ref Range   Vancomycin Tr 38.2 (*) 10.0 - 20.0 ug/mL   Comment: CRITICAL RESULT CALLED TO, READ BACK BY AND VERIFIED WITH:     T.OLEARY,RN 1240 09/04/14 CLARK,S  GLUCOSE, CAPILLARY     Status: Abnormal   Collection Time    09/04/14  4:43 PM      Result Value Ref Range   Glucose-Capillary 66 (*) 70 - 99 mg/dL   Comment 1 Capillary Sample    GLUCOSE, CAPILLARY      Status: None   Collection Time    09/04/14  5:16 PM      Result Value Ref Range   Glucose-Capillary 94  70 - 99 mg/dL  GLUCOSE, CAPILLARY     Status: None   Collection Time    09/04/14  7:32 PM      Result Value Ref Range   Glucose-Capillary 90  70 - 99 mg/dL   Comment 1 Capillary Sample    GLUCOSE, CAPILLARY     Status: None   Collection Time    09/05/14 12:11 AM      Result Value Ref Range   Glucose-Capillary 83  70 - 99 mg/dL   Comment 1 Arterial Sample    GLUCOSE, CAPILLARY     Status: None   Collection Time    09/05/14  3:43 AM      Result Value Ref Range   Glucose-Capillary 88  70 - 99 mg/dL  MAGNESIUM     Status: None   Collection Time    09/05/14  4:00 AM      Result Value Ref Range   Magnesium 2.1  1.5 - 2.5 mg/dL  CBC     Status: Abnormal   Collection Time    09/05/14  4:00 AM      Result Value Ref Range   WBC 14.3 (*) 4.0 - 10.5 K/uL   RBC  3.34 (*) 3.87 - 5.11 MIL/uL   Hemoglobin 9.8 (*) 12.0 - 15.0 g/dL   HCT 28.5 (*) 36.0 - 46.0 %   MCV 85.3  78.0 - 100.0 fL   MCH 29.3  26.0 - 34.0 pg   MCHC 34.4  30.0 - 36.0 g/dL   RDW 14.4  11.5 - 15.5 %   Platelets 96 (*) 150 - 400 K/uL   Comment: PLATELET COUNT CONFIRMED BY SMEAR  BASIC METABOLIC PANEL     Status: Abnormal   Collection Time    09/05/14  4:00 AM      Result Value Ref Range   Sodium 138  137 - 147 mEq/L   Comment: DELTA CHECK NOTED   Potassium 3.9  3.7 - 5.3 mEq/L   Comment: DELTA CHECK NOTED     NO VISIBLE HEMOLYSIS   Chloride 98  96 - 112 mEq/L   CO2 31  19 - 32 mEq/L   Glucose, Bld 83  70 - 99 mg/dL   BUN 24 (*) 6 - 23 mg/dL   Creatinine, Ser 2.83 (*) 0.50 - 1.10 mg/dL   Calcium 7.9 (*) 8.4 - 10.5 mg/dL   GFR calc non Af Amer 22 (*) >90 mL/min   GFR calc Af Amer 25 (*) >90 mL/min   Comment: (NOTE)     The eGFR has been calculated using the CKD EPI equation.     This calculation has not been validated in all clinical situations.     eGFR's persistently <90 mL/min signify possible  Chronic Kidney     Disease.   Anion gap 9  5 - 15  PHOSPHORUS     Status: None   Collection Time    09/05/14  4:00 AM      Result Value Ref Range   Phosphorus 3.6  2.3 - 4.6 mg/dL  BLOOD GAS, ARTERIAL     Status: Abnormal   Collection Time    09/05/14  4:42 AM      Result Value Ref Range   FIO2 0.40     Delivery systems VENTILATOR     Mode PRESSURE REGULATED VOLUME CONTROL     VT 420     Rate 12     Peep/cpap 5.0     pH, Arterial 7.307 (*) 7.350 - 7.450   pCO2 arterial 51.0 (*) 35.0 - 45.0 mmHg   pO2, Arterial 152.0 (*) 80.0 - 100.0 mmHg   Bicarbonate 24.7 (*) 20.0 - 24.0 mEq/L   TCO2 26.3  0 - 100 mmol/L   Acid-base deficit 0.8  0.0 - 2.0 mmol/L   O2 Saturation 98.8     Patient temperature 98.5     Collection site A-LINE     Drawn by [phone]     Sample type ARTERIAL DRAW     Allens test (pass/fail) PASS  PASS  GLUCOSE, CAPILLARY     Status: Abnormal   Collection Time    09/05/14  7:18 AM      Result Value Ref Range   Glucose-Capillary 100 (*) 70 - 99 mg/dL   Comment 1 Capillary Sample    POCT I-STAT 3, ART BLOOD GAS (G3+)     Status: Abnormal   Collection Time    09/05/14  8:31 AM      Result Value Ref Range   pH, Arterial 7.418  7.350 - 7.450   pCO2 arterial 51.1 (*) 35.0 - 45.0 mmHg   pO2, Arterial 154.0 (*) 80.0 - 100.0 mmHg   Bicarbonate  33.3 (*) 20.0 - 24.0 mEq/L   TCO2 35  0 - 100 mmol/L   O2 Saturation 99.0     Acid-Base Excess 7.0 (*) 0.0 - 2.0 mmol/L   Patient temperature 97.1 F     Sample type ARTERIAL    GLUCOSE, CAPILLARY     Status: None   Collection Time    09/05/14 11:29 AM      Result Value Ref Range   Glucose-Capillary 96  70 - 99 mg/dL   Comment 1 Capillary Sample    GLUCOSE, CAPILLARY     Status: None   Collection Time    09/05/14  4:49 PM      Result Value Ref Range   Glucose-Capillary 82  70 - 99 mg/dL  GLUCOSE, CAPILLARY     Status: None   Collection Time    09/05/14  7:52 PM      Result Value Ref Range   Glucose-Capillary 76  70 -  99 mg/dL   Comment 1 Capillary Sample    GLUCOSE, CAPILLARY     Status: None   Collection Time    09/05/14 11:31 PM      Result Value Ref Range   Glucose-Capillary 77  70 - 99 mg/dL   Comment 1 Capillary Sample    GLUCOSE, CAPILLARY     Status: Abnormal   Collection Time    09/06/14  3:49 AM      Result Value Ref Range   Glucose-Capillary 68 (*) 70 - 99 mg/dL   Comment 1 Capillary Sample    BASIC METABOLIC PANEL     Status: Abnormal   Collection Time    09/06/14  4:11 AM      Result Value Ref Range   Sodium 144  137 - 147 mEq/L   Potassium 3.5 (*) 3.7 - 5.3 mEq/L   Chloride 98  96 - 112 mEq/L   CO2 34 (*) 19 - 32 mEq/L   Glucose, Bld 71  70 - 99 mg/dL   BUN 25 (*) 6 - 23 mg/dL   Creatinine, Ser 2.93 (*) 0.50 - 1.10 mg/dL   Calcium 8.2 (*) 8.4 - 10.5 mg/dL   GFR calc non Af Amer 21 (*) >90 mL/min   GFR calc Af Amer 24 (*) >90 mL/min   Comment: (NOTE)     The eGFR has been calculated using the CKD EPI equation.     This calculation has not been validated in all clinical situations.     eGFR's persistently <90 mL/min signify possible Chronic Kidney     Disease.   Anion gap 12  5 - 15  CBC     Status: Abnormal   Collection Time    09/06/14  4:11 AM      Result Value Ref Range   WBC 9.4  4.0 - 10.5 K/uL   RBC 3.13 (*) 3.87 - 5.11 MIL/uL   Hemoglobin 9.3 (*) 12.0 - 15.0 g/dL   HCT 27.2 (*) 36.0 - 46.0 %   MCV 86.9  78.0 - 100.0 fL   MCH 29.7  26.0 - 34.0 pg   MCHC 34.2  30.0 - 36.0 g/dL   RDW 14.5  11.5 - 15.5 %   Platelets 99 (*) 150 - 400 K/uL   Comment: CONSISTENT WITH PREVIOUS RESULT  MAGNESIUM     Status: None   Collection Time    09/06/14  4:11 AM      Result Value Ref Range   Magnesium 2.0  1.5 - 2.5 mg/dL  PHOSPHORUS     Status: None   Collection Time    09/06/14  4:11 AM      Result Value Ref Range   Phosphorus 3.5  2.3 - 4.6 mg/dL  GLUCOSE, CAPILLARY     Status: Abnormal   Collection Time    09/06/14  4:37 AM      Result Value Ref Range    Glucose-Capillary 101 (*) 70 - 99 mg/dL   Comment 1 Capillary Sample    GLUCOSE, CAPILLARY     Status: Abnormal   Collection Time    09/06/14  8:22 AM      Result Value Ref Range   Glucose-Capillary 65 (*) 70 - 99 mg/dL    Imaging: Imaging results have been reviewed  Tele: NSR   Assessment/Plan:   1. Active Problems: 2.   Cardiac arrest 3.   Hypokalemia 4.   Acute respiratory failure with hypoxia 5.   Acute encephalopathy 6.   Time Spent Directly with Patient:  20 minutes  Length of Stay:  LOS: 6 days   1. Day # 6 SCD/ Artic Sun/ VDRF. Extubated. Good sats. VSS. NSR. EF was 30% by last 2D on 10/26. Will re check beginning of next week. Suspect slow improvement over time. HR in 50s, SBP 109. No BB for now. Recheck 12 lead EKG. Will need formal EP eval +/- cath prior to D/C  2. Anoxic encephalopathy- Amazing neuro recovery. Pt is alert, oriented and follows commands. Will see how much cognitive recovery she will have. Swallowing study per PCCM  3. ARF/ATN- Scr still slightly worse. Baseline SCr 1. Good UOP to IV lasix yesterday (over 3 liters). Continue to follow.  4. Hypokalemia- K= 3.5. Repletion per PCCM. Mg 2.   Lorretta Harp 09/06/2014, 9:17 AM

## 2014-09-06 NOTE — Progress Notes (Addendum)
PULMONARY / CRITICAL CARE MEDICINE   Name: Julie Saunders MRN: [id number] DOB: 26-May-1986    ADMISSION DATE:  08/31/2014  REFERRING MD :  Dr. Jacelyn Grip   CHIEF COMPLAINT:  Cardiac Arrest    INITIAL PRESENTATION:  28 y/o female admitted 10/22 after VFib arrest at home and prolonged CPR (prolonged down time) when presenting to the ER.  Etiology felt to be hypokalemia from diarrhea.  To ICU for hypothermia protocol.  STUDIES:   10/16  Colonoscopy (Per [facility]) >> displays active duodenitis with areas of villous blunting/atrophy in addition to a small granuloma with multinucleated giant cells as seen in one of the fragments. Given the history of Crohn's disease, the changes are most compatible with involvement by the same disease process 10/22  CT head >> neg 10/22  CT cervical spine >> neg 10/22  ECHO >> RV normal, LVEF 20-25% 10/23 LE Doppler >>neg 10/24 ct>>Bowel wall thickening of terminal ileum and significant portions ofparticularly the distal colon compatible with enteritis, favor Crohn's disease in light of history.<Bibasilar effusions, atelectasis and suspected lower lobe  SIGNIFICANT EVENTS: 10/22  Admit after several days diarrhea, cardiac arrest at home with prolonged CPR 10/24- rewarmed, follows commands 10/25- improved levo major 10/26: weaned on PS all day 10/27: extubated  SUBJECTIVE: Extubated and communicative, no events overnight and no new complaints.  VITAL SIGNS: Temp:  [98.3 F (36.8 C)-98.9 F (37.2 C)] 98.6 F (37 C) (10/28 0349) Pulse Rate:  [43-88] 51 (10/28 0700) Resp:  [10-27] 13 (10/28 0700) BP: (93-143)/(46-71) 104/55 mmHg (10/28 0700) SpO2:  [94 %-100 %] 97 % (10/28 0700) Arterial Line BP: (133-156)/(62-84) 139/69 mmHg (10/27 1600) FiO2 (%):  [30 %] 30 % (10/27 0735) Weight:  [79.3 kg (174 lb 13.2 oz)] 79.3 kg (174 lb 13.2 oz) (10/28 0500)  HEMODYNAMICS: CVP:  [6 mmHg-12 mmHg] 6 mmHg  VENTILATOR SETTINGS: Vent Mode:  [-] PSV;CPAP FiO2 (%):  [30  %] 30 % PEEP:  [5 cmH20] 5 cmH20 Pressure Support:  [5 cmH20] 5 cmH20  INTAKE / OUTPUT:  Intake/Output Summary (Last 24 hours) at 09/06/14 0709 Last data filed at 09/06/14 0603  Gross per 24 hour  Intake 461.45 ml  Output   4125 ml  Net -3663.55 ml    PHYSICAL EXAMINATION: General:  No pain, awake follows commands, communicating and protecting airway HEENT: [place]/AT, PERRL, EOM-I and MMM PULM: Diminshed at bases but otherwise clear. CV: RRR, no mgr AB: BS wnl, no r/g, soft, pads wnl Ext: Good cap refil, color wnl  Neuro: alert, FSC MAE  LABS:  CBC  Recent Labs Lab 09/04/14 0515 09/05/14 0400 09/06/14 0411  WBC 27.9* 14.3* 9.4  HGB 10.0* 9.8* 9.3*  HCT 28.4* 28.5* 27.2*  PLT 152 96* 99*   Coag's  Recent Labs Lab 08/31/14 1219 08/31/14 1413 08/31/14 2015  APTT  --  32 33  INR 1.40 1.14 1.12   BMET  Recent Labs Lab 09/04/14 0515 09/05/14 0400 09/06/14 0411  NA 129* 138 144  K 3.2* 3.9 3.5*  CL 93* 98 98  CO2 25 31 34*  BUN 20 24* 25*  CREATININE 2.49* 2.83* 2.93*  GLUCOSE 91 83 71   Electrolytes  Recent Labs Lab 08/31/14 2121  09/04/14 0515 09/05/14 0400 09/06/14 0411  CALCIUM  --   < > 7.8* 7.9* 8.2*  MG 1.9  < > 2.2 2.1 2.0  PHOS 2.2*  --   --  3.6 3.5  < > = values in this interval  not displayed. Sepsis Markers  Recent Labs Lab 08/31/14 1219 08/31/14 1233 09/01/14 1027  LATICACIDVEN 17.2* 15.78* 4.6*   ABG  Recent Labs Lab 09/03/14 1206 09/05/14 0442 09/05/14 0831  PHART 7.364 7.307* 7.418  PCO2ART 34.8* 51.0* 51.1*  PO2ART 176.0* 152.0* 154.0*   Liver Enzymes  Recent Labs Lab 09/02/14 0525 09/03/14 0335 09/04/14 0515  AST 69* 127* 211*  ALT 44* 59* 97*  ALKPHOS 68 88 66  BILITOT 0.4 0.6 0.5  ALBUMIN 2.1* 1.6* 1.5*   Cardiac Enzymes  Recent Labs Lab 08/31/14 1900 08/31/14 2328 09/01/14 0420  TROPONINI 1.27* 1.02* 0.97*   Glucose  Recent Labs Lab 09/05/14 1129 09/05/14 1649 09/05/14 1952  09/05/14 2331 09/06/14 0349 09/06/14 0437  GLUCAP 96 82 76 77 68* 101*    Imaging 10/24CXR> ETT, cvl noted, diffuse int infiltrate rt greater left  ASSESSMENT / PLAN:  PULMONARY  OETT 10/22 >> 10/27 A:   Acute hypoxemic respiratory failure - in the setting of cardiac arrest, likely secondary to QTC from hypoK Bilateral basilar infiltrates / atx ABG on wean acceptable, SBI 43, nodding appropriate, no secretions P:   - Titrate O2. - Swallow evaluation today. - IS per RT protocol. - Ambulate.  CARDIOVASCULAR CVL L IJ 10/22 >> A:  VFib arrest - most likely hypokalemia  Shock > cardiogenic likely Prolonged QTc > resolved NSTEMI> stunned myocardium from  No evidence of rel AI Reduced EF, septic cardiomyoapthy? takasub? P:  - Cards consult - Rec EP eval and discussion of ICD and angio prior to DC. Hold beta blocker now for bradycardia - Anti-HTN as ordered. - D/C pressors.  RENAL A:    AKI- creatinine at 2.83, on admission 0.9 Hypokalemia Hypocalcemia Hyponatremia resolved since diuretics  P:   - Trend BMET and BMP - Replace electrolytes as indicated. - D/C further lasix and allow Cr to normalize - Renal U/S WNL  GASTROINTESTINAL A:  Crohns - colonoscopy done 10/16 with multiple bx sites, neg for malignancy.  C/W Active Gastritis.   Diarrhea  Elevated Ammonia resolved, due to shock Protein Calorie Malnutrition admission albumin 1.8 P:   - SUP: pepcid. - Hold home medications: pentasa, imodium, will start once able to take PO - Will consider steroids if GI feels necessary but hold off for now. - Swallow evaluation ordered.  HEMATOLOGIC A:   dvt prevention Leukocytosis improving Anemia with no overt bleeding at this time P:  - Monitor CBC in am   - DVT: SCD's + heparin sq - LE doppler neg  INFECTIOUS A:   Diarrhea - Crohns- improving with minimal output today  P:   BCx2 10/22 >> Stool Culture 10/22 >> negative Abx: Zosyn 10/22>>>10/23 vanc  10/24>>10/26 casp 10/24>>>10/25  ENDOCRINE A:   Hyperglycemia - likely stress response, no hx of DM P:   - SSI - No role stress steroids  NEUROLOGIC A:   Acute anoxic encephalopathy - post arrest  Now alert and oriented to all; slow to respond  P:   - RASS goal: 0 - Fent drip DC - Propofol drip DC - Fentanyl pushes prn as needed; careful with sedative medications  GLOBAL:    - Updates: Father and patient updated bedside.  TODAY'S SUMMARY: PT/OT, titrate O2 down, continue zosyn until tomorrows dose then d/c, hold in ICU for monitoring overnight.   Rush Farmer, M.D. [facility] Pulmonary/Critical Care Medicine. Pager: [phone]. After hours pager: [phone].

## 2014-09-06 NOTE — Evaluation (Signed)
Clinical/Bedside Swallow Evaluation Patient Details  Name: Julie Saunders MRN: [id number] Date of Birth: [birth year]  Today's Date: 09/06/2014 Time: 1009-1033 SLP Time Calculation (min): 24 min  Past Medical History:  Past Medical History  Diagnosis Date  . Crohn disease   . Migraine   . Allergy   . Pancytopenia 11/23/2012   Past Surgical History: History reviewed. No pertinent past surgical history. HPI:  28 y/o female admitted 10/22 after VFib arrest at home and prolonged CPR (prolonged down time) when presenting to the ER.  Etiology felt to be hypokalemia from diarrhea.  To ICU for hypothermia protocol. No h/o swallowing difficulty reported.   Assessment / Plan / Recommendation Clinical Impression  Pt presents with signs of dysphagia likely due to current cognitive status as well as recent 5-day intubation. She exhibits oral holding, decreased bolus awareness, and suspected delayed swallow initiation requiring Max cues from SLP for initiation and Mod cues for sustained attention. Subtle signs of airway compromise were noted with thin liquids, and patient is at a heightened risk for aspiration given the above. Recommend to initiate Dys 2 textures and nectar thick liquids by cup sips only, with full supervision. SLP to follow for tolerance and readiness to advance. Also recommend SLP cognitive-linguistic evaluation given the aforementioned cognitive deficits.    Aspiration Risk  Moderate    Diet Recommendation Dysphagia 2 (Fine chop);Nectar-thick liquid   Liquid Administration via: Cup;No straw Medication Administration: Crushed with puree Supervision: Staff to assist with self feeding;Full supervision/cueing for compensatory strategies Compensations: Slow rate;Small sips/bites;Check for pocketing;Check for anterior loss Postural Changes and/or Swallow Maneuvers: Seated upright 90 degrees    Other  Recommendations Oral Care Recommendations: Oral care BID Other Recommendations:  Order thickener from pharmacy;Prohibited food (jello, ice cream, thin soups);Remove water pitcher   Follow Up Recommendations  Inpatient Rehab;24 hour supervision/assistance    Frequency and Duration min 2x/week  2 weeks   Pertinent Vitals/Pain n/a    SLP Swallow Goals     Swallow Study Prior Functional Status       General Date of Onset: 08/31/14 HPI: 28 y/o female admitted 10/22 after VFib arrest at home and prolonged CPR (prolonged down time) when presenting to the ER.  Etiology felt to be hypokalemia from diarrhea.  To ICU for hypothermia protocol. No h/o swallowing difficulty reported. Type of Study: Bedside swallow evaluation Previous Swallow Assessment: none in chart Diet Prior to this Study: NPO Temperature Spikes Noted: No Respiratory Status: Room air History of Recent Intubation: Yes Length of Intubations (days): 5 days Date extubated: 09/05/14 Behavior/Cognition: Alert;Cooperative;Requires cueing;Other (comment) (slow initiation) Oral Cavity - Dentition: Adequate natural dentition Self-Feeding Abilities: Needs assist Patient Positioning: Upright in chair Baseline Vocal Quality: Low vocal intensity;Hoarse (mild hoarseness) Volitional Cough: Weak    Oral/Motor/Sensory Function     Ice Chips Ice chips: Impaired Presentation: Spoon Oral Phase Functional Implications: Oral holding Pharyngeal Phase Impairments: Suspected delayed Swallow   Thin Liquid Thin Liquid: Impaired Presentation: Cup;Self Fed Oral Phase Impairments: Poor awareness of bolus Oral Phase Functional Implications: Right anterior spillage;Left anterior spillage;Oral holding Pharyngeal  Phase Impairments: Suspected delayed Swallow;Throat Clearing - Delayed    Nectar Thick Nectar Thick Liquid: Impaired Presentation: Cup;Self Fed;Straw Oral phase functional implications: Oral holding Pharyngeal Phase Impairments: Suspected delayed Swallow   Honey Thick Honey Thick Liquid: Not tested   Puree Puree:  Impaired Presentation: Self Fed;Spoon Oral Phase Functional Implications: Oral holding Pharyngeal Phase Impairments: Suspected delayed Swallow   Solid   GO  Solid: Impaired Presentation: Self Fed Oral Phase Impairments: Impaired mastication Oral Phase Functional Implications: Oral holding Pharyngeal Phase Impairments: Suspected delayed Swallow      Germain Osgood, M.A. CCC-SLP [phone]  Germain Osgood 09/06/2014,10:51 AM

## 2014-09-06 NOTE — Progress Notes (Signed)
[facility] PROGRESS NOTE Subjective speech pathology evaluation noted. Patient apparently has some cognitive impairment after her recent arrest and intubation. She is still having significant diarrhea and probably will need to be on steroids to control her Crohn's disease.  Objective: Vital signs in last 24 hours: Temp:  [98.1 F (36.7 C)-98.9 F (37.2 C)] 98.1 F (36.7 C) (10/28 1140) Pulse Rate:  [43-73] 55 (10/28 1200) Resp:  [10-22] 16 (10/28 1200) BP: (93-128)/(46-99) 111/58 mmHg (10/28 1200) SpO2:  [94 %-100 %] 100 % (10/28 1200) Arterial Line BP: (133-144)/(62-75) 139/69 mmHg (10/27 1600) Weight:  [79.3 kg (174 lb 13.2 oz)] 79.3 kg (174 lb 13.2 oz) (10/28 0500) Last BM Date: 09/05/14  Intake/Output from previous day: 10/27 0701 - 10/28 0700 In: 461.5 [I.V.:121.5; NG/GT:90; IV Piggyback:250] Out: 4125 [Urine:4125] Intake/Output this shift: Total I/O In: 130 [I.V.:30; IV Piggyback:100] Out: 360 [Urine:360]    Lab Results:  Recent Labs  09/04/14 0515 09/05/14 0400 09/06/14 0411  WBC 27.9* 14.3* 9.4  HGB 10.0* 9.8* 9.3*  HCT 28.4* 28.5* 27.2*  PLT 152 96* 99*   BMET  Recent Labs  09/03/14 2325 09/04/14 0515 09/05/14 0400 09/06/14 0411  NA 129* 129* 138 144  K 3.7 3.2* 3.9 3.5*  CL 92* 93* 98 98  CO2 24 25 31  34*  CREATININE 2.44* 2.49* 2.83* 2.93*   LFT  Recent Labs  09/04/14 0515  PROT 4.2*  AST 211*  ALT 97*  ALKPHOS 66  BILITOT 0.5   PT/INR No results found for this basename: LABPROT, INR,  in the last 72 hours PANCREAS No results found for this basename: LIPASE,  in the last 72 hours       Studies/Results: US Renal Port  09/05/2014   CLINICAL DATA:  Renal failure. History of Crohn's disease. Pancytopenia history.  EXAM: RENAL/URINARY TRACT ULTRASOUND COMPLETE  COMPARISON:  CT, 09/02/2014.  FINDINGS: Right Kidney:  Length: 12.4 cm. Echogenicity within normal limits. No mass or hydronephrosis visualized.  Left Kidney:   Length: 11.8 cm. Echogenicity within normal limits. No mass or hydronephrosis visualized.  Bladder:  Mildly distended.  Foley catheter in place, otherwise unremarkable.  Small left pleural effusion.  IMPRESSION: Normal renal ultrasound.  No hydronephrosis.   Electronically Signed   By: Lajean Manes M.D.   On: 09/05/2014 13:35   Dg Chest Port 1 View  09/05/2014   CLINICAL DATA:  Hypoxia  EXAM: PORTABLE CHEST - 1 VIEW  COMPARISON:  September 04, 2014  FINDINGS: Endotracheal tube tip is 6.5 cm above the carina. Central catheter tip is at the cavoatrial junction level. Nasogastric tube tip and side port are below the diaphragm. No pneumothorax. There is persistent left lower lobe consolidation. Right lung is clear. Heart is upper normal in size with pulmonary vascularity within normal limits. No adenopathy. No bone lesions.  IMPRESSION: Tube and catheter positions as described without pneumothorax. Persistent left lower lobe consolidation. No new opacity. No change in cardiac silhouette.   Electronically Signed   By: Lowella Grip M.D.   On: 09/05/2014 07:30    Medications: I have reviewed the patient's current medications.  Assessment/Plan: 1. Crohn's disease. Patient is continuing to have diarrhea and I think should be treated. It's not clear when she will be able to take oral medicines for that reason we will go ahead and start her on some obviously Solu-Medrol. Her stool cultures and C. difficile toxin have all been negative. When she is adequately taking oral medications, we will change her  over to oral prednisone and Pentasa.   Eknoor Novack JR,Zowie Lundahl L 09/06/2014, 12:06 PM

## 2014-09-07 DIAGNOSIS — I4901 Ventricular fibrillation: Principal | ICD-10-CM

## 2014-09-07 LAB — BASIC METABOLIC PANEL
Anion gap: 16 — ABNORMAL HIGH (ref 5–15)
BUN: 24 mg/dL — ABNORMAL HIGH (ref 6–23)
CHLORIDE: 97 meq/L (ref 96–112)
CO2: 33 mEq/L — ABNORMAL HIGH (ref 19–32)
Calcium: 8.2 mg/dL — ABNORMAL LOW (ref 8.4–10.5)
Creatinine, Ser: 2.54 mg/dL — ABNORMAL HIGH (ref 0.50–1.10)
GFR calc non Af Amer: 25 mL/min — ABNORMAL LOW (ref >90)
GFR, EST AFRICAN AMERICAN: 29 mL/min — AB (ref >90)
Glucose, Bld: 87 mg/dL (ref 70–99)
POTASSIUM: 3.7 meq/L (ref 3.7–5.3)
SODIUM: 146 meq/L (ref 137–147)

## 2014-09-07 LAB — CBC
HEMATOCRIT: 25.7 % — AB (ref 36.0–46.0)
Hemoglobin: 8.8 g/dL — ABNORMAL LOW (ref 12.0–15.0)
MCH: 29.8 pg (ref 26.0–34.0)
MCHC: 34.2 g/dL (ref 30.0–36.0)
MCV: 87.1 fL (ref 78.0–100.0)
PLATELETS: 122 10*3/uL — AB (ref 150–400)
RBC: 2.95 MIL/uL — ABNORMAL LOW (ref 3.87–5.11)
RDW: 14.5 % (ref 11.5–15.5)
WBC: 9 10*3/uL (ref 4.0–10.5)

## 2014-09-07 LAB — PHOSPHORUS: PHOSPHORUS: 12.9 mg/dL — AB (ref 2.3–4.6)

## 2014-09-07 LAB — GLUCOSE, CAPILLARY
GLUCOSE-CAPILLARY: 88 mg/dL (ref 70–99)
GLUCOSE-CAPILLARY: 150 mg/dL — AB (ref 70–99)
Glucose-Capillary: 104 mg/dL — ABNORMAL HIGH (ref 70–99)
Glucose-Capillary: 110 mg/dL — ABNORMAL HIGH (ref 70–99)
Glucose-Capillary: 181 mg/dL — ABNORMAL HIGH (ref 70–99)
Glucose-Capillary: 115 mg/dL — ABNORMAL HIGH (ref 70–99)

## 2014-09-07 LAB — MAGNESIUM: MAGNESIUM: 1.9 mg/dL (ref 1.5–2.5)

## 2014-09-07 MED ORDER — POTASSIUM CHLORIDE 10 MEQ/50ML IV SOLN
10.0000 meq | INTRAVENOUS | Status: AC
  Administered 2014-09-07 (×2): 10 meq via INTRAVENOUS
  Filled 2014-09-07 (×2): qty 50

## 2014-09-07 MED ORDER — MAGNESIUM OXIDE 400 (241.3 MG) MG PO TABS
400.0000 mg | ORAL_TABLET | Freq: Two times a day (BID) | ORAL | Status: DC
  Administered 2014-09-07: 400 mg via ORAL
  Filled 2014-09-07 (×2): qty 1

## 2014-09-07 MED ORDER — MESALAMINE ER 250 MG PO CPCR
500.0000 mg | ORAL_CAPSULE | Freq: Three times a day (TID) | ORAL | Status: DC
  Administered 2014-09-07 – 2014-09-14 (×21): 500 mg via ORAL
  Filled 2014-09-07 (×24): qty 2

## 2014-09-07 MED ORDER — MAGNESIUM SULFATE 40 MG/ML IJ SOLN
2.0000 g | Freq: Once | INTRAMUSCULAR | Status: AC
  Administered 2014-09-07: 2 g via INTRAVENOUS
  Filled 2014-09-07: qty 50

## 2014-09-07 NOTE — Progress Notes (Signed)
PULMONARY / CRITICAL CARE MEDICINE   Name: Julie Saunders MRN: [id number] DOB: February 07, 1986    ADMISSION DATE:  08/31/2014  REFERRING MD :  Dr. Jacelyn Grip   CHIEF COMPLAINT:  Cardiac Arrest    INITIAL PRESENTATION:  28 y/o female admitted 10/22 after VFib arrest at home and prolonged CPR (prolonged down time) when presenting to the ER.  Etiology felt to be hypokalemia from diarrhea.  To ICU for hypothermia protocol.  STUDIES:   10/16  Colonoscopy (Per [facility]) >> displays active duodenitis with areas of villous blunting/atrophy in addition to a small granuloma with multinucleated giant cells as seen in one of the fragments. Given the history of Crohn's disease, the changes are most compatible with involvement by the same disease process 10/22  CT head >> neg 10/22  CT cervical spine >> neg 10/22  ECHO >> RV normal, LVEF 20-25% 10/23 LE Doppler >>neg 10/24 ct>>Bowel wall thickening of terminal ileum and significant portions ofparticularly the distal colon compatible with enteritis, favor Crohn's disease in light of history.<Bibasilar effusions, atelectasis and suspected lower lobe  SIGNIFICANT EVENTS: 10/22  Admit after several days diarrhea, cardiac arrest at home with prolonged CPR 10/24- rewarmed, follows commands 10/25- improved levo major 10/26: weaned on PS all day 10/27: extubated 10/29 transfer to tele  SUBJECTIVE: Extubated and communicative, no events overnight and no new complaints.  Ambulating and doing well.  VITAL SIGNS: Temp:  [97.6 F (36.4 C)-98.7 F (37.1 C)] 98.6 F (37 C) (10/29 0800) Pulse Rate:  [40-113] 49 (10/29 0900) Resp:  [16-27] 20 (10/29 0900) BP: (86-120)/(43-70) 109/55 mmHg (10/29 0900) SpO2:  [89 %-100 %] 95 % (10/29 0900) Weight:  [78.7 kg (173 lb 8 oz)] 78.7 kg (173 lb 8 oz) (10/29 0600)  HEMODYNAMICS: CVP:  [3 mmHg-6 mmHg] 3 mmHg  VENTILATOR SETTINGS:    INTAKE / OUTPUT:  Intake/Output Summary (Last 24 hours) at 09/07/14 1048 Last data  filed at 09/07/14 1000  Gross per 24 hour  Intake    600 ml  Output   1505 ml  Net   -905 ml    PHYSICAL EXAMINATION: General:  No pain, awake follows commands, communicating and protecting airway HEENT: [place]/AT, PERRL, EOM-I and MMM PULM: Diminshed at bases but otherwise clear. CV: RRR, no mgr AB: BS wnl, no r/g, soft, pads wnl Ext: Good cap refil, color wnl  Neuro: alert, FSC MAE  LABS:  CBC  Recent Labs Lab 09/05/14 0400 09/06/14 0411 09/07/14 0400  WBC 14.3* 9.4 9.0  HGB 9.8* 9.3* 8.8*  HCT 28.5* 27.2* 25.7*  PLT 96* 99* 122*   Coag's  Recent Labs Lab 08/31/14 1219 08/31/14 1413 08/31/14 2015  APTT  --  32 33  INR 1.40 1.14 1.12   BMET  Recent Labs Lab 09/05/14 0400 09/06/14 0411 09/07/14 0400  NA 138 144 146  K 3.9 3.5* 3.7  CL 98 98 97  CO2 31 34* 33*  BUN 24* 25* 24*  CREATININE 2.83* 2.93* 2.54*  GLUCOSE 83 71 87   Electrolytes  Recent Labs Lab 09/05/14 0400 09/06/14 0411 09/07/14 0400  CALCIUM 7.9* 8.2* 8.2*  MG 2.1 2.0 1.9  PHOS 3.6 3.5 12.9*   Sepsis Markers  Recent Labs Lab 08/31/14 1219 08/31/14 1233 09/01/14 1027  LATICACIDVEN 17.2* 15.78* 4.6*   ABG  Recent Labs Lab 09/03/14 1206 09/05/14 0442 09/05/14 0831  PHART 7.364 7.307* 7.418  PCO2ART 34.8* 51.0* 51.1*  PO2ART 176.0* 152.0* 154.0*   Liver Enzymes  Recent  Labs Lab 09/02/14 0525 09/03/14 0335 09/04/14 0515  AST 69* 127* 211*  ALT 44* 59* 97*  ALKPHOS 68 88 66  BILITOT 0.4 0.6 0.5  ALBUMIN 2.1* 1.6* 1.5*   Cardiac Enzymes  Recent Labs Lab 08/31/14 1900 08/31/14 2328 09/01/14 0420 09/06/14 0411  TROPONINI 1.27* 1.02* 0.97*  --   PROBNP  --   --   --  72182.8*   Glucose  Recent Labs Lab 09/06/14 1153 09/06/14 1617 09/06/14 2050 09/06/14 2322 09/07/14 0341 09/07/14 0839  GLUCAP 80 87 115* 104* 88 110*    Imaging 10/24CXR> ETT, cvl noted, diffuse int infiltrate rt greater left  ASSESSMENT / PLAN:  PULMONARY  OETT 10/22 >>  10/27 A:   Acute hypoxemic respiratory failure - in the setting of cardiac arrest, likely secondary to QTC from hypoK Bilateral basilar infiltrates / atx ABG on wean acceptable, SBI 43, nodding appropriate, no secretions P:   - Titrate O2. - Swallow evaluation today. - IS per RT protocol. - Ambulate.  CARDIOVASCULAR CVL L IJ 10/22 >> A:  VFib arrest - most likely hypokalemia  Shock > cardiogenic likely Prolonged QTc > resolved NSTEMI> stunned myocardium from  No evidence of rel AI Reduced EF, septic cardiomyoapthy? takasub? P:  - Cards consult - Rec EP eval and discussion of ICD and angio prior to DC. Hold beta blocker now for bradycardia - Anti-HTN as ordered. - D/C pressors.  RENAL A:    AKI- creatinine at 2.83, on admission 0.9 Hypokalemia Hypocalcemia Hyponatremia resolved since diuretics  P:   - Trend BMET and BMP - Replace electrolytes as indicated. - D/C further lasix and allow Cr to normalize - Renal U/S WNL  GASTROINTESTINAL A:  Crohns - colonoscopy done 10/16 with multiple bx sites, neg for malignancy.  C/W Active Gastritis.   Diarrhea  Elevated Ammonia resolved, due to shock Protein Calorie Malnutrition admission albumin 1.8 P:   - SUP: pepcid. - Restart pentasa. - Will consider steroids if GI feels necessary but hold off for now.  HEMATOLOGIC A:   dvt prevention Leukocytosis improving Anemia with no overt bleeding at this time P:  - Monitor CBC in am   - DVT: SCD's + heparin sq - LE doppler neg  INFECTIOUS A:   Diarrhea - Crohns- improving with minimal output today  P:   BCx2 10/22 >> Stool Culture 10/22 >> negative Abx: Zosyn 10/22>>>10/29 vanc 10/24>>10/26 casp 10/24>>>10/25  ENDOCRINE A:   Hyperglycemia - likely stress response, no hx of DM P:   - SSI - No role stress steroids  NEUROLOGIC A:   Acute anoxic encephalopathy - post arrest  Now alert and oriented to all; slow to respond  P:   - RASS goal: 0 - Fent drip  DC - Propofol drip DC - Fentanyl pushes prn as needed; careful with sedative medications  GLOBAL:    - Updates: Father and patient updated bedside.  TODAY'S SUMMARY: Transfer to tele and TRH, PCCM will sign off, please call back if needed.   Rush Farmer, M.D. [facility] Pulmonary/Critical Care Medicine. Pager: [phone]. After hours pager: [phone].

## 2014-09-07 NOTE — Evaluation (Signed)
Speech Language Pathology Evaluation Patient Details Name: Julie Saunders MRN: [id number] DOB: [birth year] Today's Date: 09/07/2014 Time: 4098-1191 SLP Time Calculation (min): 20 min  Problem List:  Patient Active Problem List   Diagnosis Date Noted  . Cardiac arrest 08/31/2014  . Hypokalemia 08/31/2014  . Acute respiratory failure with hypoxia 08/31/2014  . Acute encephalopathy 08/31/2014  . Pancytopenia 11/23/2012   Past Medical History:  Past Medical History  Diagnosis Date  . Crohn disease   . Migraine   . Allergy   . Pancytopenia 11/23/2012   Past Surgical History: History reviewed. No pertinent past surgical history. HPI:  28 y/o female admitted 10/22 after VFib arrest at home and prolonged CPR (prolonged down time) when presenting to the ER.  Etiology felt to be hypokalemia from diarrhea.  To ICU for hypothermia protocol. No h/o swallowing difficulty reported.   Assessment / Plan / Recommendation Clinical Impression  Pt. exhbits moderate cognitive impairments characteristic of a pt. with an anoxic brain injury.  Impairments evidenced in the areas of working and prospective memory, temporal orientation, problem solving (verbal > functional), awareness, sustained attention and pragmatics (exhibits childlike mannerisms).  Educated initiated briefly with dad re: diagnosis and recovery. ST will treat pt. to facilitate cognitive abilities s/p anoxic brain injury.        SLP Assessment  Patient needs continued Speech Lanaguage Pathology Services    Follow Up Recommendations  Inpatient Rehab    Frequency and Duration min 3x week  2 weeks   Pertinent Vitals/Pain Pain Assessment: No/denies pain   SLP Goals  Progression toward goals: Progressing toward goals Potential to Achieve Goals: Good  SLP Evaluation Prior Functioning  Cognitive/Linguistic Baseline: Within functional limits (per father) Baseline deficit details:  (N/A) Type of Home: Mobile home  Lives With: Family  (dad) Available Help at Discharge: Family Education:  ("was going back to school" per dad; will receive clarificati) Vocation: Unemployed   Cognition  Overall Cognitive Status: Impaired/Different from baseline Arousal/Alertness: Awake/alert Orientation Level: Oriented to person;Disoriented to time;Oriented to situation Attention: Sustained Sustained Attention: Impaired Sustained Attention Impairment: Verbal basic;Functional basic Memory: Impaired Memory Impairment: Prospective memory;Decreased short term memory;Decreased recall of new information;Retrieval deficit Decreased Short Term Memory: Verbal basic;Functional basic Awareness: Impaired Awareness Impairment: Intellectual impairment;Emergent impairment;Anticipatory impairment Problem Solving: Impaired Problem Solving Impairment: Verbal basic;Functional basic (verbal better than functional) Executive Function: Self Correcting;Self Monitoring;Initiating;Organizing;Reasoning;Decision Making Behaviors: Restless (immature; childlike) Safety/Judgment: Impaired    Comprehension  Auditory Comprehension Overall Auditory Comprehension: Appears within functional limits for tasks assessed Yes/No Questions: Within Functional Limits Commands: Within Functional Limits (followed one 3 step command; recalled/out of sequence) Conversation: Simple Interfering Components: Attention;Processing speed;Working Curator: Not tested Reading Comprehension Reading Status:  (TBA)    Expression Expression Primary Mode of Expression: Verbal Verbal Expression Overall Verbal Expression: Impaired Initiation: No impairment Level of Generative/Spontaneous Verbalization: Sentence Repetition: No impairment Naming:  (not specifically assessed) Pragmatics: Impairment Impairments: Abnormal affect;Dysprosody Written Expression Dominant Hand: Left (dad says can also write with right hand) Written Expression:   (functional for name, address, assess high level in tx)   Oral / Motor Oral Motor/Sensory Function Overall Oral Motor/Sensory Function:  (see bedside eval) Motor Speech Overall Motor Speech: Impaired Respiration: Within functional limits Phonation: Low vocal intensity Resonance: Within functional limits Articulation: Within functional limitis Intelligibility: Intelligibility reduced Word: 75-100% accurate Phrase: 75-100% accurate Sentence: 75-100% accurate Motor Planning: Witnin functional limits   GO     [name] 09/07/2014, 2:39 PM  Lattie Haw  Jannifer [employer] [phone]

## 2014-09-07 NOTE — Progress Notes (Signed)
Pharmacist Heart Failure Core Measure Documentation  Assessment: Julie Saunders has an EF documented as 25-30% on 09/04/14 by Echo.  Rationale: Heart failure patients with left ventricular systolic dysfunction (LVSD) and an EF < 40% should be prescribed an angiotensin converting enzyme inhibitor (ACEI) or angiotensin receptor blocker (ARB) at discharge unless a contraindication is documented in the medical record.  This patient is not currently on an ACEI or ARB for HF.  This note is being placed in the record in order to provide documentation that a contraindication to the use of these agents is present for this encounter.  ACE Inhibitor or Angiotensin Receptor Blocker is contraindicated (specify all that apply)  []   ACEI allergy AND ARB allergy []   Angioedema []   Moderate or severe aortic stenosis []   Hyperkalemia [x]   Hypotension []   Renal artery stenosis [x]   Worsening renal function, preexisting renal disease or dysfunction  Hildred Laser, Pharm D 09/07/2014 8:33 AM

## 2014-09-07 NOTE — Progress Notes (Signed)
Patient ID: Julie Saunders, female   DOB: [birth year], 28 y.o.   MRN: [id number]  Patient seen for social visit yesterday. She was lethargic but able to follow simple commands and knew she was at [facility]. Talked with father. Agree with IV Solumedrol. Resume Pentasa when able to take POs adequately. Appreciate the excellent care from all of her hospital doctors.

## 2014-09-07 NOTE — Progress Notes (Signed)
Subjective:  Pt extubated, somnolent this AM. Father reports that she is alert, oriented   Objective:  Temp:  [97.6 F (36.4 C)-98.7 F (37.1 C)] 97.6 F (36.4 C) (10/29 0300) Pulse Rate:  [42-113] 42 (10/29 0703) Resp:  [16-27] 22 (10/29 0703) BP: (86-120)/(43-99) 106/60 mmHg (10/29 0703) SpO2:  [89 %-100 %] 95 % (10/29 0703) Weight:  [173 lb 8 oz (78.7 kg)] 173 lb 8 oz (78.7 kg) (10/29 0600) Weight change: -1 lb 5.2 oz (-0.6 kg)  Intake/Output from previous day: 10/28 0701 - 10/29 0700 In: 400 [I.V.:100; IV Piggyback:300] Out: 1720 [Urine:970; Stool:750]  Intake/Output from this shift:    Physical Exam: General appearance: alert and no distress Neck: no adenopathy, no carotid bruit, no JVD, supple, symmetrical, trachea midline and thyroid not enlarged, symmetric, no tenderness/mass/nodules Lungs: clear to auscultation bilaterally Heart: regular rate and rhythm, S1, S2 normal, no murmur, click, rub or gallop Extremities: extremities normal, atraumatic, no cyanosis or edema  Lab Results: Results for orders placed during the hospital encounter of 08/31/14 (from the past 48 hour(s))  POCT I-STAT 3, ART BLOOD GAS (G3+)     Status: Abnormal   Collection Time    09/05/14  8:31 AM      Result Value Ref Range   pH, Arterial 7.418  7.350 - 7.450   pCO2 arterial 51.1 (*) 35.0 - 45.0 mmHg   pO2, Arterial 154.0 (*) 80.0 - 100.0 mmHg   Bicarbonate 33.3 (*) 20.0 - 24.0 mEq/L   TCO2 35  0 - 100 mmol/L   O2 Saturation 99.0     Acid-Base Excess 7.0 (*) 0.0 - 2.0 mmol/L   Patient temperature 97.1 F     Sample type ARTERIAL    GLUCOSE, CAPILLARY     Status: None   Collection Time    09/05/14 11:29 AM      Result Value Ref Range   Glucose-Capillary 96  70 - 99 mg/dL   Comment 1 Capillary Sample    GLUCOSE, CAPILLARY     Status: None   Collection Time    09/05/14  4:49 PM      Result Value Ref Range   Glucose-Capillary 82  70 - 99 mg/dL  GLUCOSE, CAPILLARY     Status:  None   Collection Time    09/05/14  7:52 PM      Result Value Ref Range   Glucose-Capillary 76  70 - 99 mg/dL   Comment 1 Capillary Sample    GLUCOSE, CAPILLARY     Status: None   Collection Time    09/05/14 11:31 PM      Result Value Ref Range   Glucose-Capillary 77  70 - 99 mg/dL   Comment 1 Capillary Sample    GLUCOSE, CAPILLARY     Status: Abnormal   Collection Time    09/06/14  3:49 AM      Result Value Ref Range   Glucose-Capillary 68 (*) 70 - 99 mg/dL   Comment 1 Capillary Sample    PRO B NATRIURETIC PEPTIDE     Status: Abnormal   Collection Time    09/06/14  4:11 AM      Result Value Ref Range   Pro B Natriuretic peptide (BNP) 43778.0 (*) 0 - 125 pg/mL  BASIC METABOLIC PANEL     Status: Abnormal   Collection Time    09/06/14  4:11 AM      Result Value Ref Range   Sodium 144  137 - 147 mEq/L   Potassium 3.5 (*) 3.7 - 5.3 mEq/L   Chloride 98  96 - 112 mEq/L   CO2 34 (*) 19 - 32 mEq/L   Glucose, Bld 71  70 - 99 mg/dL   BUN 25 (*) 6 - 23 mg/dL   Creatinine, Ser 2.93 (*) 0.50 - 1.10 mg/dL   Calcium 8.2 (*) 8.4 - 10.5 mg/dL   GFR calc non Af Amer 21 (*) >90 mL/min   GFR calc Af Amer 24 (*) >90 mL/min   Comment: (NOTE)     The eGFR has been calculated using the CKD EPI equation.     This calculation has not been validated in all clinical situations.     eGFR's persistently <90 mL/min signify possible Chronic Kidney     Disease.   Anion gap 12  5 - 15  CBC     Status: Abnormal   Collection Time    09/06/14  4:11 AM      Result Value Ref Range   WBC 9.4  4.0 - 10.5 K/uL   RBC 3.13 (*) 3.87 - 5.11 MIL/uL   Hemoglobin 9.3 (*) 12.0 - 15.0 g/dL   HCT 27.2 (*) 36.0 - 46.0 %   MCV 86.9  78.0 - 100.0 fL   MCH 29.7  26.0 - 34.0 pg   MCHC 34.2  30.0 - 36.0 g/dL   RDW 14.5  11.5 - 15.5 %   Platelets 99 (*) 150 - 400 K/uL   Comment: CONSISTENT WITH PREVIOUS RESULT  MAGNESIUM     Status: None   Collection Time    09/06/14  4:11 AM      Result Value Ref Range    Magnesium 2.0  1.5 - 2.5 mg/dL  PHOSPHORUS     Status: None   Collection Time    09/06/14  4:11 AM      Result Value Ref Range   Phosphorus 3.5  2.3 - 4.6 mg/dL  GLUCOSE, CAPILLARY     Status: Abnormal   Collection Time    09/06/14  4:37 AM      Result Value Ref Range   Glucose-Capillary 101 (*) 70 - 99 mg/dL   Comment 1 Capillary Sample    GLUCOSE, CAPILLARY     Status: Abnormal   Collection Time    09/06/14  8:22 AM      Result Value Ref Range   Glucose-Capillary 65 (*) 70 - 99 mg/dL  GLUCOSE, CAPILLARY     Status: None   Collection Time    09/06/14  9:18 AM      Result Value Ref Range   Glucose-Capillary 75  70 - 99 mg/dL   Comment 1 Capillary Sample    GLUCOSE, CAPILLARY     Status: None   Collection Time    09/06/14 11:53 AM      Result Value Ref Range   Glucose-Capillary 80  70 - 99 mg/dL   Comment 1 Capillary Sample    GLUCOSE, CAPILLARY     Status: None   Collection Time    09/06/14  4:17 PM      Result Value Ref Range   Glucose-Capillary 87  70 - 99 mg/dL  GLUCOSE, CAPILLARY     Status: Abnormal   Collection Time    09/06/14  8:50 PM      Result Value Ref Range   Glucose-Capillary 115 (*) 70 - 99 mg/dL  GLUCOSE, CAPILLARY     Status: Abnormal  Collection Time    09/06/14 11:22 PM      Result Value Ref Range   Glucose-Capillary 104 (*) 70 - 99 mg/dL   Comment 1 Capillary Sample    CBC     Status: Abnormal   Collection Time    09/07/14  4:00 AM      Result Value Ref Range   WBC 9.0  4.0 - 10.5 K/uL   RBC 2.95 (*) 3.87 - 5.11 MIL/uL   Hemoglobin 8.8 (*) 12.0 - 15.0 g/dL   HCT 25.7 (*) 36.0 - 46.0 %   MCV 87.1  78.0 - 100.0 fL   MCH 29.8  26.0 - 34.0 pg   MCHC 34.2  30.0 - 36.0 g/dL   RDW 14.5  11.5 - 15.5 %   Platelets 122 (*) 150 - 400 K/uL  BASIC METABOLIC PANEL     Status: Abnormal   Collection Time    09/07/14  4:00 AM      Result Value Ref Range   Sodium 146  137 - 147 mEq/L   Potassium 3.7  3.7 - 5.3 mEq/L   Chloride 97  96 - 112 mEq/L    CO2 33 (*) 19 - 32 mEq/L   Glucose, Bld 87  70 - 99 mg/dL   BUN 24 (*) 6 - 23 mg/dL   Creatinine, Ser 2.54 (*) 0.50 - 1.10 mg/dL   Calcium 8.2 (*) 8.4 - 10.5 mg/dL   GFR calc non Af Amer 25 (*) >90 mL/min   GFR calc Af Amer 29 (*) >90 mL/min   Comment: (NOTE)     The eGFR has been calculated using the CKD EPI equation.     This calculation has not been validated in all clinical situations.     eGFR's persistently <90 mL/min signify possible Chronic Kidney     Disease.   Anion gap 16 (*) 5 - 15  MAGNESIUM     Status: None   Collection Time    09/07/14  4:00 AM      Result Value Ref Range   Magnesium 1.9  1.5 - 2.5 mg/dL  PHOSPHORUS     Status: Abnormal   Collection Time    09/07/14  4:00 AM      Result Value Ref Range   Phosphorus 12.9 (*) 2.3 - 4.6 mg/dL    Imaging: Imaging results have been reviewed  Tele: SB in the 40s  Assessment/Plan:   1. Active Problems: 2.   Cardiac arrest 3.   Hypokalemia 4.   Acute respiratory failure with hypoxia 5.   Acute encephalopathy 6.   Time Spent Directly with Patient:  20 minutes  Length of Stay:  LOS: 7 days   Day # 8 SCD/Artic Sun/ VDRF. Successfully extubated. Sats 97% on RA. VSS except HR slow (on no neg chronotropic agents). Can't use BB. Renal FXN slowly improving. Was OOB in chair yesterday. On no drips. D2 diet. Her EKG shows Lat TWI. Will re check 2D tomorrow. EP to see to eval for ICD Rx. May need cath prior to that once renal Fxn normalizes.   Julie Saunders 09/07/2014, 8:04 AM

## 2014-09-07 NOTE — Progress Notes (Signed)
Speech Language Pathology Treatment: Dysphagia  Patient Details Name: Julie Saunders MRN: [id number] DOB: Jul 16, 1986 Today's Date: 09/07/2014 Time: 7681-1572 SLP Time Calculation (min): 8 min  Assessment / Plan / Recommendation Clinical Impression  Brief dysphagia session as pt. just completed meal.  Intermittent oral holding of nectar liquids bolus due to cognitive deficits.  No indications of laryngeal penetration or aspiration.  Mild verbal cues to transit bolus for swallow initiation. Will continue to follow and recommend diet/liquid upgrade when appropriate.   HPI HPI: 28 y/o female admitted 10/22 after VFib arrest at home and prolonged CPR (prolonged down time) when presenting to the ER.  Etiology felt to be hypokalemia from diarrhea.  To ICU for hypothermia protocol. No h/o swallowing difficulty reported.   Pertinent Vitals Pain Assessment: No/denies pain  SLP Plan  Continue with current plan of care    Recommendations Diet recommendations: Dysphagia 2 (fine chop);Nectar-thick liquid Liquids provided via: Cup;No straw Medication Administration: Crushed with puree Supervision: Full supervision/cueing for compensatory strategies;Patient able to self feed;Staff to assist with self feeding Compensations: Slow rate;Small sips/bites;Check for pocketing;Check for anterior loss Postural Changes and/or Swallow Maneuvers: Seated upright 90 degrees              Oral Care Recommendations: Oral care BID Follow up Recommendations: Inpatient Rehab Plan: Continue with current plan of care    GO     Julie Saunders 09/07/2014, 2:21 PM  [name] [name] [phone]

## 2014-09-07 NOTE — Progress Notes (Signed)
Physical Therapy Treatment Patient Details Name: Julie Saunders MRN: [id number] DOB: 1986/06/06 Today's Date: 09/07/2014    History of Present Illness Pt adm after cardiac arrest at home likely due to low K due to diarrhea. Pt with prolonged down time. Hypothermia protocol. Pt extubated 10/27. Pt with history of Crohns.    PT Comments    Pt greatly improved but continues to have significant cognitive and motor deficits. Excellent CIR candidate.  Follow Up Recommendations  CIR     Equipment Recommendations  Other (comment) (to be assessed in next venue)    Recommendations for Other Services       Precautions / Restrictions Precautions Precautions: Fall Precaution Comments: flexiseal Restrictions Weight Bearing Restrictions: No    Mobility  Bed Mobility Overal bed mobility: Needs Assistance Bed Mobility: Supine to Sit     Supine to sit: Mod assist;HOB elevated     General bed mobility comments: Verbal/tactile cues to sequence, physical assist for LEs.  Transfers Overall transfer level: Needs assistance Equipment used:  (stedy) Transfers: Stand Pivot Transfers Sit to Stand: +2 physical assistance;Mod assist Stand pivot transfers: +2 physical assistance;Mod assist       General transfer comment: Verbal cues to wait until therapist ready to initiate standing. Assist for balance and to bring hips up. When returning to sitting pt with knees in hyperextension and when cued to bend knees pt with uncontrolled descent to chair.  Ambulation/Gait Ambulation/Gait assistance: +2 physical assistance;Max assist Ambulation Distance (Feet): 2 Feet Assistive device: Rolling walker (2 wheeled) Gait Pattern/deviations: Step-through pattern;Scissoring;Narrow base of support   Gait velocity interpretation: Below normal speed for age/gender General Gait Details: Pt with difficulty grading movement of lower extremiities. Pt taking too large of steps putting her feet way in front of  walker and also scissoring legs. Pt with knees hyperextended and decr trunk control. Amb halted after 2' and worked on static standing.   Stairs            Wheelchair Mobility    Modified Rankin (Stroke Patients Only)       Balance Overall balance assessment: Needs assistance Sitting-balance support: Feet supported Sitting balance-Leahy Scale: Poor Sitting balance - Comments: sat EOB with min assist x 10 minutes, performed wide range reaching Postural control: Other (comment) (swaying, poor midline orientation) Standing balance support: Bilateral upper extremity supported Standing balance-Leahy Scale: Zero                      Cognition Arousal/Alertness: Awake/alert Behavior During Therapy: Impulsive Overall Cognitive Status: Impaired/Different from baseline Area of Impairment: Orientation;Attention;Following commands;Safety/judgement;Awareness;Problem solving Orientation Level: Disoriented to;Time;Situation Current Attention Level: Sustained Memory: Decreased short-term memory Following Commands: Follows one step commands inconsistently (requires multi modal cues) Safety/Judgement: Decreased awareness of safety;Decreased awareness of deficits Awareness:  (pre intellectual) Problem Solving: Slow processing;Decreased initiation;Difficulty sequencing;Requires verbal cues;Requires tactile cues      Exercises      General Comments        Pertinent Vitals/Pain Pain Assessment: No/denies pain    Home Living Family/patient expects to be discharged to:: Private residence Living Arrangements: Parent Available Help at Discharge: Family Type of Home: Mobile home Home Access: Stairs to enter Entrance Stairs-Rails: Right Home Layout: One level Home Equipment: None      Prior Function Level of Independence: Independent          PT Goals (current goals can now be found in the care plan section) Acute Rehab PT Goals Patient Stated Goal: Pt wants  to go  home. PT Goal Formulation: With patient Time For Goal Achievement: 09/21/14 Potential to Achieve Goals: Good Progress towards PT goals: Goals met and updated - see care plan    Frequency  Min 3X/week    PT Plan Current plan remains appropriate    Co-evaluation PT/OT/SLP Co-Evaluation/Treatment: Yes Reason for Co-Treatment: Complexity of the patient's impairments (multi-system involvement);Necessary to address cognition/behavior during functional activity;For patient/therapist safety PT goals addressed during session: Mobility/safety with mobility;Balance;Proper use of DME OT goals addressed during session: Strengthening/ROM     End of Session Equipment Utilized During Treatment: Gait belt;Other (comment) Charlaine Dalton) Activity Tolerance: Patient tolerated treatment well Patient left: in chair;with call bell/phone within reach;with family/visitor present     Time: 1126-1203 PT Time Calculation (min): 37 min  Charges:  $Gait Training: 8-22 mins $Therapeutic Activity: 8-22 mins                    G Codes:      Brina Umeda 09/30/14, 2:25 PM  [employer] PT [phone]

## 2014-09-07 NOTE — Consult Note (Signed)
ELECTROPHYSIOLOGY CONSULT NOTE    Patient ID: Julie Saunders MRN: [id number], DOB/AGE: Aug 19, 1986 28 y.o.  Admit date: 08/31/2014 Date of Consult: 09-07-14  Primary Physician: Donnie Coffin, MD Primary Cardiologist: new to [facility]  Reason for Consultation: VF arrest  HPI:  Julie Saunders is a 28 y.o. female with a past medical history significant for Chron's disease and migraine headaches.  She underwent colonoscopy 08-25-14 which demonstrated minimal signs of active Chron's.  Following colonscopy, she had several days of diarrhea.  On 08-31-14, she collapsed at home and had a prolonged downtime of at least 30 minutes.  AED defibrillated patient 4 times for presumed VF (no strips available).  She was brought to [facility] and cooled.  Initial K was 2.6. She had acute renal failure which is now trending down.  Echocardiogram demonstrated EF 25-30%, no RWMA, mild MR.  QT has been prolonged during admission.  QTc now normalized with relative bradycardia.   Notably, she has a history of recurrent hypokalemia in the past. Potassium levels have been recorded as low as 2.3. Renal function has previously been normal. Creatinine is now recovering in the mid twos.  There no prior ECGs.  She has been rewarmed and extubated.  She answers questions appropriately but has no recall of the events following her colonoscopy 08-25-14.    Cath is pending normalization of renal function.   Lab work is also notable for Hgb of 14.9--->> 8.8 during this admission.   She currently denies chest pain, shortness of breath, palpitations.  She does not remember recent fevers or chills.  No current nausea or vomiting.  She does state that she is fatigued.  She has had occasional tachypalpitations in the past which lasts minutes and have not been associated with dizziness or pre-syncope.   There is no family history of sudden death, drownings, crib death.   The patient has had one episode of lightheadedness  without syncope.  EP has been asked to evaluate for treatment options.   Past Medical History  Diagnosis Date  . Crohn disease   . Migraine   . Allergy   . Pancytopenia 11/23/2012     Surgical History: History reviewed. No pertinent past surgical history.   Prescriptions prior to admission  Medication Sig Dispense Refill  . acetaminophen (TYLENOL) 325 MG tablet Take 650 mg by mouth every 6 (six) hours as needed for mild pain.       Marland Kitchen ALPRAZolam (XANAX) 0.25 MG tablet Take 0.25 mg by mouth 3 (three) times daily as needed for anxiety.      . dimenhyDRINATE (DRAMAMINE) 50 MG tablet Take 50 mg by mouth daily as needed for dizziness.      . mesalamine (PENTASA) 250 MG CR capsule Take 1,000 mg by mouth 4 (four) times daily.      . promethazine (PHENERGAN) 25 MG tablet Take 25 mg by mouth every 4 (four) hours as needed for nausea or vomiting.      Marland Kitchen amoxicillin-clavulanate (AUGMENTIN) 875-125 MG per tablet Take 1 tablet by mouth every 12 (twelve) hours. Farther states she had to take for a short amount of time. He thinks she completed course of medication two weeks ago from today (08-31-14)      . doxycycline (VIBRA-TABS) 100 MG tablet Take 100 mg by mouth 2 (two) times daily. Take for short amount of time per farther. Farther states he thinks she completed course of medication  couple weeks ago from today (08-31-14)  Inpatient Medications:  . antiseptic oral rinse  7 mL Mouth Rinse BID  . famotidine (PEPCID) IV  20 mg Intravenous Q24H  . heparin subcutaneous  5,000 Units Subcutaneous 3 times per day  . magnesium oxide  400 mg Oral BID  . methylPREDNISolone (SOLU-MEDROL) injection  40 mg Intravenous Q12H  . piperacillin-tazobactam (ZOSYN)  IV  3.375 g Intravenous 3 times per day  . potassium chloride  10 mEq Intravenous Q1 Hr x 2  . sodium chloride  10-40 mL Intracatheter Q12H    Allergies:  Allergies  Allergen Reactions  . Sulfa Antibiotics     History   Social History  .  Marital Status: Single    Spouse Name: N/A    Number of Children: N/A  . Years of Education: N/A   Occupational History  . Not on file.   Social History Main Topics  . Smoking status: Never Smoker   . Smokeless tobacco: Not on file  . Alcohol Use: 0.5 oz/week    1 drink(s) per week  . Drug Use: No  . Sexual Activity: Not on file   Other Topics Concern  . Not on file   Social History Narrative  . No narrative on file     Family History  Problem Relation Age of Onset  . Heart attack Mother     BP 106/60  Pulse 42  Temp(Src) 97.6 F (36.4 C) (Oral)  Resp 22  Ht 5' 6"  (1.676 m)  Wt 173 lb 8 oz (78.7 kg)  BMI 28.02 kg/m2  SpO2 95%  Physical Exam: Well developed and nourished in no acute distress HENT normal Neck supple with JVP-flat Clear Regular rate and rhythm, no murmurs or gallops Abd-soft with active BS No Clubbing cyanosis edema Skin-warm and dry A & Oriented  Grossly normal sensory and motor function   Labs:   Lab Results  Component Value Date   WBC 9.0 09/07/2014   HGB 8.8* 09/07/2014   HCT 25.7* 09/07/2014   MCV 87.1 09/07/2014   PLT 122* 09/07/2014    Recent Labs Lab 09/04/14 0515  09/07/14 0400  NA 129*  < > 146  K 3.2*  < > 3.7  CL 93*  < > 97  CO2 25  < > 33*  BUN 20  < > 24*  CREATININE 2.49*  < > 2.54*  CALCIUM 7.8*  < > 8.2*  PROT 4.2*  --   --   BILITOT 0.5  --   --   ALKPHOS 66  --   --   ALT 97*  --   --   AST 211*  --   --   GLUCOSE 91  < > 87  < > = values in this interval not displayed.  Radiology/Studies: Ct Head Wo Contrast 08/31/2014   CLINICAL DATA:  28YOF was at home, family heard he collapse in the next room, went to find her and found her unresponsive. Fire department arrived started CPR (was down for about 10-15 mins before CPR was started). EMs arrived noted fine v-fib was defibrillated total of 4 times  EXAM: CT HEAD WITHOUT CONTRAST  CT CERVICAL SPINE WITHOUT CONTRAST  TECHNIQUE: Multidetector CT imaging of the  head and cervical spine was performed following the standard protocol without intravenous contrast. Multiplanar CT image reconstructions of the cervical spine were also generated.  COMPARISON:  None.  FINDINGS: CT HEAD FINDINGS  There is no evidence of mass effect, midline shift or extra-axial fluid collections. There is  no evidence of a space-occupying lesion or intracranial hemorrhage. There is no evidence of a cortical-based area of acute infarction.  The ventricles and sulci are appropriate for the patient's age. Incidental note is made of cavum septum pellucidum. The basal cisterns are patent.  Visualized portions of the orbits are unremarkable. The visualized portions of the paranasal sinuses and mastoid air cells are unremarkable.  The osseous structures are unremarkable.  CT CERVICAL SPINE FINDINGS  The alignment is anatomic. The vertebral body heights are maintained. There is no acute fracture. There is no static listhesis. The prevertebral soft tissues are normal. The intraspinal soft tissues are not fully imaged on this examination due to poor soft tissue contrast, but there is no gross soft tissue abnormality.  The disc spaces are maintained.  There are bilateral apical interstitial and alveolar airspace opacities most consistent with pulmonary edema given the patient's history.  IMPRESSION: 1. No acute intracranial pathology. 2. No acute osseous injury of the cervical spine. 3. Biapical interstitial and alveolar airspace opacities most concerning for pulmonary edema given the patient's history.   Electronically Signed   By: Kathreen Devoid   On: 08/31/2014 13:31   US Renal Port 09/05/2014   CLINICAL DATA:  Renal failure. History of Crohn's disease. Pancytopenia history.  EXAM: RENAL/URINARY TRACT ULTRASOUND COMPLETE  COMPARISON:  CT, 09/02/2014.  FINDINGS: Right Kidney:  Length: 12.4 cm. Echogenicity within normal limits. No mass or hydronephrosis visualized.  Left Kidney:  Length: 11.8 cm. Echogenicity  within normal limits. No mass or hydronephrosis visualized.  Bladder:  Mildly distended.  Foley catheter in place, otherwise unremarkable.  Small left pleural effusion.  IMPRESSION: Normal renal ultrasound.  No hydronephrosis.   Electronically Signed   By: Lajean Manes M.D.   On: 09/05/2014 13:35   Dg Chest Port 1 View 09/05/2014   CLINICAL DATA:  Hypoxia  EXAM: PORTABLE CHEST - 1 VIEW  COMPARISON:  September 04, 2014  FINDINGS: Endotracheal tube tip is 6.5 cm above the carina. Central catheter tip is at the cavoatrial junction level. Nasogastric tube tip and side port are below the diaphragm. No pneumothorax. There is persistent left lower lobe consolidation. Right lung is clear. Heart is upper normal in size with pulmonary vascularity within normal limits. No adenopathy. No bone lesions.  IMPRESSION: Tube and catheter positions as described without pneumothorax. Persistent left lower lobe consolidation. No new opacity. No change in cardiac silhouette.   Electronically Signed   By: Lowella Grip M.D.   On: 09/05/2014 07:30   QIH:KVQQV brady, rate 46, QT 577, inferolateral T wave inversions  TELEMETRY: sinus bradycardia with occasional PVC's closely coupled to preceding QRS complex  Impression/plan  Aborted cardiac arrest  Hypokalemia associated with #1  Sinus bradycardia   Recurrent hypokalemia  Cardiomyopathy ejection fraction 30-35%  Renal insufficiency  The challenging issue here is the constellation of potential precipitating factors for ventricular fibrillation which could also be a consequence of cardiac arrest. Specifically hypokalemia can be a consequence of cardiac arrest, this is less impressive than the situation with her antecedent history of recurrent hypokalemia.  In addition, her cardiomyopathic process may be consequential to her cardiac arrest as suggested by the evolution of her ECG changes.  Still however, or uncertainty in this regard may prompt the need for protection  against the risk of recurrent ventricular fibrillation.  Moreover, the sinus bradycardia in the absence of an explanation also suggests again a cardiomyopathic process or channelopathy   I have broached with the family  the issue of multiple unanswerable questions and the likely recommendation for ICD implantation.  Catheterization is pending hopefully with resolution of her renal dysfunction. I think a Myoview scan was sufficient information. We will also however need to make sure that coronary artery ostia are in the proper location.

## 2014-09-07 NOTE — Consult Note (Signed)
Physical Medicine and Rehabilitation Consult Reason for Consult: Cardiac arrest/VDRF Referring Physician: Critical care   HPI: Julie Saunders is a 28 y.o. right handed female with history of Crohn's disease with recent ongoing bouts of diarrhea. Patient independent prior to admission living with her father. Admitted 08/31/2014 after collapse witnessed by her father with subsequent cardiac arrest at home with prolonged CPR by EMS. Patient found to be in V. fib received a total of 4 defibrillations and 3 doses of epinephrine. EKG with bigeminy, T-wave flattening. Troponin 1.27. Cranial CT scan negative for acute changes. Noted hypokalemia 2.6 with supplement added. Cardiology follow-up. Venous Doppler studies lower extremities negative. Echocardiogram with ejection fraction of 30% no wall motion abnormalities. Patient required intubation was extubated 09/05/2014. Cardiac arrest felt to be secondary to hypokalemia. Hospital course patient bouts of confusion felt to be secondary to acute encephalopathy with progressive improvement. Follow-up gastroenterology services for patient's Crohn's disease ongoing bouts of diarrhea and started on Solu-Medrol. Her stool cultures and C. difficile toxins all remained negative. She is currently on a dysphagia 2 nectar thick liquid diet. Subcutaneous heparin for DVT prophylaxis. Physical and occupational therapy evaluations completed 09/06/2014 with recommendations of physical medicine rehabilitation consult.  Father notes that the patient has been very tired and sleepy since hospitalization. Also notes some memory problems. Patient states her problems started after her colonoscopy however the patient had been home for 1 week following the colonoscopy. Review of Systems  Gastrointestinal: Positive for diarrhea.  Neurological: Positive for weakness and headaches.  All other systems reviewed and are negative.  Past Medical History  Diagnosis Date  . Crohn  disease   . Migraine   . Allergy   . Pancytopenia 11/23/2012   History reviewed. No pertinent past surgical history. Family History  Problem Relation Age of Onset  . Heart attack Mother    Social History:  reports that she has never smoked. She does not have any smokeless tobacco history on file. She reports that she drinks about .5 ounces of alcohol per week. She reports that she does not use illicit drugs. Allergies:  Allergies  Allergen Reactions  . Sulfa Antibiotics    Medications Prior to Admission  Medication Sig Dispense Refill  . acetaminophen (TYLENOL) 325 MG tablet Take 650 mg by mouth every 6 (six) hours as needed for mild pain.       Marland Kitchen ALPRAZolam (XANAX) 0.25 MG tablet Take 0.25 mg by mouth 3 (three) times daily as needed for anxiety.      . dimenhyDRINATE (DRAMAMINE) 50 MG tablet Take 50 mg by mouth daily as needed for dizziness.      . mesalamine (PENTASA) 250 MG CR capsule Take 1,000 mg by mouth 4 (four) times daily.      . promethazine (PHENERGAN) 25 MG tablet Take 25 mg by mouth every 4 (four) hours as needed for nausea or vomiting.      Marland Kitchen amoxicillin-clavulanate (AUGMENTIN) 875-125 MG per tablet Take 1 tablet by mouth every 12 (twelve) hours. Farther states she had to take for a short amount of time. He thinks she completed course of medication two weeks ago from today (08-31-14)      . doxycycline (VIBRA-TABS) 100 MG tablet Take 100 mg by mouth 2 (two) times daily. Take for short amount of time per farther. Farther states he thinks she completed course of medication  couple weeks ago from today (08-31-14)        Home: Home Living Family/patient  expects to be discharged to:: Private residence Living Arrangements: Parent Available Help at Discharge: Family Type of Home: Mobile home Home Access: Stairs to enter Technical brewer of Steps: 3-4 Entrance Stairs-Rails: Right Home Layout: One level Home Equipment: None  Functional History: Prior Function Level  of Independence: Independent Functional Status:  Mobility: Bed Mobility Overal bed mobility: Needs Assistance Bed Mobility: Rolling;Supine to Sit;Sit to Sidelying Rolling: Mod assist Supine to sit: +2 for physical assistance;Max assist Sit to sidelying: +2 for physical assistance;Max assist General bed mobility comments: Verbal/tactile cues to reach with UE for rolling. Assist to manage trunk and legs. Transfers General transfer comment: Bed to chair with maximove.      ADL:    Cognition: Cognition Overall Cognitive Status: Impaired/Different from baseline Orientation Level: Oriented X4 Cognition Arousal/Alertness: Awake/alert Behavior During Therapy: WFL for tasks assessed/performed Overall Cognitive Status: Impaired/Different from baseline Area of Impairment: Orientation;Attention;Following commands;Safety/judgement;Awareness;Problem solving Orientation Level: Disoriented to;Time;Situation Current Attention Level: Sustained Memory: Decreased short-term memory Following Commands: Follows one step commands inconsistently (requires tactile cuing) Safety/Judgement: Decreased awareness of safety;Decreased awareness of deficits Problem Solving: Slow processing;Decreased initiation;Difficulty sequencing;Requires verbal cues;Requires tactile cues  Blood pressure 108/52, pulse 60, temperature 97.6 F (36.4 C), temperature source Oral, resp. rate 19, height 5' 6"  (1.676 m), weight 79.3 kg (174 lb 13.2 oz), SpO2 100.00%. Physical Exam  Vitals reviewed. Constitutional: She appears well-developed.  HENT:  Head: Normocephalic.  Eyes:  Pupils sluggish to light  Neck: Normal range of motion. Neck supple. No thyromegaly present.  Cardiovascular: Normal rate and regular rhythm.   Respiratory: Effort normal and breath sounds normal. No respiratory distress.  GI: Soft. Bowel sounds are normal. She exhibits no distension.  Neurological:  Patient is lethargic but arousable. She does make  good eye contact with examiner. She would not initiate conversation but was able to provide her name, age and date of birth. She follows simple commands. She does have some decreased awareness in her deficits as well as recall of events  Skin: Skin is warm and dry.  Several beats of horizontal nystagmus looking to the right as well as looking to the left. Pupils equal round and reactive to light Decreased accommodation Motor strength is 4/5 bilateral deltoids, biceps, triceps, grip, hip flexor, knee extensor, ankle dorsiflexor and plantar flexor Sensory intact to light touch bilateral upper and lower limbs Oriented to day of week but not month Speech without dysarthria Cerebellar shows mild dysmetria left greater than right on finger-nose-finger testing. No evidence of dysmetria on heel-to-shin testing  Results for orders placed during the hospital encounter of 08/31/14 (from the past 24 hour(s))  GLUCOSE, CAPILLARY     Status: Abnormal   Collection Time    09/06/14  8:22 AM      Result Value Ref Range   Glucose-Capillary 65 (*) 70 - 99 mg/dL  GLUCOSE, CAPILLARY     Status: None   Collection Time    09/06/14  9:18 AM      Result Value Ref Range   Glucose-Capillary 75  70 - 99 mg/dL   Comment 1 Capillary Sample    GLUCOSE, CAPILLARY     Status: None   Collection Time    09/06/14 11:53 AM      Result Value Ref Range   Glucose-Capillary 80  70 - 99 mg/dL   Comment 1 Capillary Sample    GLUCOSE, CAPILLARY     Status: None   Collection Time    09/06/14  4:17 PM  Result Value Ref Range   Glucose-Capillary 87  70 - 99 mg/dL  GLUCOSE, CAPILLARY     Status: Abnormal   Collection Time    09/06/14  8:50 PM      Result Value Ref Range   Glucose-Capillary 115 (*) 70 - 99 mg/dL  CBC     Status: Abnormal   Collection Time    09/07/14  4:00 AM      Result Value Ref Range   WBC 9.0  4.0 - 10.5 K/uL   RBC 2.95 (*) 3.87 - 5.11 MIL/uL   Hemoglobin 8.8 (*) 12.0 - 15.0 g/dL   HCT 25.7 (*)  36.0 - 46.0 %   MCV 87.1  78.0 - 100.0 fL   MCH 29.8  26.0 - 34.0 pg   MCHC 34.2  30.0 - 36.0 g/dL   RDW 14.5  11.5 - 15.5 %   Platelets 122 (*) 150 - 400 K/uL  BASIC METABOLIC PANEL     Status: Abnormal   Collection Time    09/07/14  4:00 AM      Result Value Ref Range   Sodium 146  137 - 147 mEq/L   Potassium 3.7  3.7 - 5.3 mEq/L   Chloride 97  96 - 112 mEq/L   CO2 33 (*) 19 - 32 mEq/L   Glucose, Bld 87  70 - 99 mg/dL   BUN 24 (*) 6 - 23 mg/dL   Creatinine, Ser 2.54 (*) 0.50 - 1.10 mg/dL   Calcium 8.2 (*) 8.4 - 10.5 mg/dL   GFR calc non Af Amer 25 (*) >90 mL/min   GFR calc Af Amer 29 (*) >90 mL/min   Anion gap 16 (*) 5 - 15  MAGNESIUM     Status: None   Collection Time    09/07/14  4:00 AM      Result Value Ref Range   Magnesium 1.9  1.5 - 2.5 mg/dL  PHOSPHORUS     Status: Abnormal   Collection Time    09/07/14  4:00 AM      Result Value Ref Range   Phosphorus 12.9 (*) 2.3 - 4.6 mg/dL   US Renal Port  09/05/2014   CLINICAL DATA:  Renal failure. History of Crohn's disease. Pancytopenia history.  EXAM: RENAL/URINARY TRACT ULTRASOUND COMPLETE  COMPARISON:  CT, 09/02/2014.  FINDINGS: Right Kidney:  Length: 12.4 cm. Echogenicity within normal limits. No mass or hydronephrosis visualized.  Left Kidney:  Length: 11.8 cm. Echogenicity within normal limits. No mass or hydronephrosis visualized.  Bladder:  Mildly distended.  Foley catheter in place, otherwise unremarkable.  Small left pleural effusion.  IMPRESSION: Normal renal ultrasound.  No hydronephrosis.   Electronically Signed   By: Lajean Manes M.D.   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 Does the need for close, 24 hr/day medical supervision in concert with the patient's rehab needs make it unreasonable for this patient to be served in a less intensive setting? Yes 2. Co-Morbidities requiring supervision/potential complications: Crohn's disease, history of  severe hypokalemia 3. Due to bladder management, bowel management, safety, skin/wound care, disease management, medication administration, pain management and patient education, does the patient require 24 hr/day rehab nursing? Yes 4. Does the patient require coordinated care of a physician, rehab nurse, PT (1-2 hrs/day, 5 days/week), OT (1-2 hrs/day, 5 days/week) and SLP (0.5-1 hrs/day, 5 days/week) to address physical and functional deficits in the context of the above medical diagnosis(es)? Yes Addressing  deficits in the following areas: balance, endurance, locomotion, strength, transferring, bowel/bladder control, bathing, dressing, feeding, grooming, toileting and cognition 5. Can the patient actively participate in an intensive therapy program of at least 3 hrs of therapy per day at least 5 days per week? Yes 6. The potential for patient to make measurable gains while on inpatient rehab is excellent 7. Anticipated functional outcomes upon discharge from inpatient rehab are supervision  with PT, supervision with OT, supervision with SLP. 8. Estimated rehab length of stay to reach the above functional goals is: 14-20 days 9. Does the patient have adequate social supports to accommodate these discharge functional goals? Yes 10. Anticipated D/C setting: Home 11. Anticipated post D/C treatments: Outpatient therapy 12. Overall Rehab/Functional Prognosis: excellent  RECOMMENDATIONS: This patient's condition is appropriate for continued rehabilitative care in the following setting: CIR Patient has agreed to participate in recommended program. Potentially Note that insurance prior authorization may be required for reimbursement for recommended care.  Comment: Father works but is supportive    09/07/2014

## 2014-09-07 NOTE — Evaluation (Signed)
Occupational Therapy Evaluation Patient Details Name: Julie Saunders MRN: [id number] DOB: 17-Oct-1986 Today's Date: 09/07/2014    History of Present Illness Pt adm after cardiac arrest at home likely due to low K due to diarrhea. Pt with prolonged down time. Hypothermia protocol. Pt extubated 10/27. Pt with history of Crohns.   Clinical Impression   Pt was independent prior to admission.  Pt presents with generalized weakness, impaired coordination, decreased balance, and impaired cognition interfering with ability to perform ADL and ADL transfers.  She requires +2 assist for OOB.  Pt will benefit from intense rehab.  Will follow acutely.    Follow Up Recommendations  CIR    Equipment Recommendations  Other (comment) (to be determined in next environment)    Recommendations for Other Services       Precautions / Restrictions Precautions Precautions: Fall Precaution Comments: flexiseal Restrictions Weight Bearing Restrictions: No      Mobility Bed Mobility Overal bed mobility: Needs Assistance Bed Mobility: Supine to Sit     Supine to sit: Mod assist;HOB elevated     General bed mobility comments: Verbal/tactile cues to sequence, physical assist for LEs.  Transfers Overall transfer level: Needs assistance Equipment used:  (stedy) Transfers: Sit to/from Stand Sit to Stand: +2 physical assistance;Mod assist         General transfer comment: with Stedy x 1 and from chair x 1    Balance Overall balance assessment: Needs assistance Sitting-balance support: Feet supported Sitting balance-Leahy Scale: Poor Sitting balance - Comments: sat EOB with min assist x 10 minutes, performed wide range reaching Postural control: Other (comment) (swaying, poor midline orientation) Standing balance support: Bilateral upper extremity supported Standing balance-Leahy Scale: Zero                              ADL Overall ADL's : Needs  assistance/impaired Eating/Feeding: Minimal assistance;Sitting (supported sitting)   Grooming: Oral care;Bed level;Minimal assistance Grooming Details (indicate cue type and reason): used her R hand Upper Body Bathing: Maximal assistance;Sitting   Lower Body Bathing: +2 for physical assistance;Maximal assistance;Sit to/from stand   Upper Body Dressing : Maximal assistance;Sitting   Lower Body Dressing: +2 for physical assistance;Maximal assistance;Sit to/from stand                 General ADL Comments: Used Stedy to transfer to chair.     Vision                     Perception     Praxis      Pertinent Vitals/Pain Pain Assessment: No/denies pain     Hand Dominance Left (pt uses both hands equally per dad)   Extremity/Trunk Assessment Upper Extremity Assessment Upper Extremity Assessment: RUE deficits/detail;LUE deficits/detail RUE Deficits / Details: ataxia, appears to have underlying low muscle tone RUE Coordination: decreased gross motor;decreased fine motor LUE Deficits / Details: ataxia, appears to have underlying low muscle tone LUE Coordination: decreased fine motor;decreased gross motor   Lower Extremity Assessment Lower Extremity Assessment: Defer to PT evaluation   Cervical / Trunk Assessment Cervical / Trunk Assessment: Normal (proximal weakness) Cervical / Trunk Exceptions: able to sit with head up x 10 minutes with min assist, increased swaying movements with decreased gradation   Communication Communication Communication: Other (comment) (slurred speech)   Cognition Arousal/Alertness: Awake/alert Behavior During Therapy: Impulsive Overall Cognitive Status: Impaired/Different from baseline Area of Impairment: Orientation;Attention;Following commands;Safety/judgement;Awareness;Problem solving Orientation Level: Disoriented  to;Time;Situation Current Attention Level: Sustained Memory: Decreased short-term memory Following Commands: Follows  one step commands inconsistently (requires multi modal cues) Safety/Judgement: Decreased awareness of safety;Decreased awareness of deficits Awareness:  (pre intellectual) Problem Solving: Slow processing;Decreased initiation;Difficulty sequencing;Requires verbal cues;Requires tactile cues     General Comments       Exercises       Shoulder Instructions      Home Living Family/patient expects to be discharged to:: Private residence Living Arrangements: Parent Available Help at Discharge: Family Type of Home: Mobile home Home Access: Stairs to enter Entrance Stairs-Number of Steps: 3-4 Entrance Stairs-Rails: Right Home Layout: One level     Bathroom Shower/Tub: Tub/shower unit;Walk-in shower   Bathroom Toilet: Standard     Home Equipment: None          Prior Functioning/Environment Level of Independence: Independent             OT Diagnosis: Generalized weakness;Cognitive deficits;Ataxia   OT Problem List: Decreased strength;Decreased activity tolerance;Impaired balance (sitting and/or standing);Decreased coordination;Decreased cognition;Decreased safety awareness;Decreased knowledge of use of DME or AE;Impaired tone;Impaired UE functional use   OT Treatment/Interventions: Self-care/ADL training;Neuromuscular education;Therapeutic activities;DME and/or AE instruction;Cognitive remediation/compensation;Patient/family education;Balance training    OT Goals(Current goals can be found in the care plan section) Acute Rehab OT Goals Patient Stated Goal: Pt wants to go home. OT Goal Formulation: With patient Time For Goal Achievement: 09/21/14 Potential to Achieve Goals: Good ADL Goals Pt Will Perform Eating: with supervision;sitting Pt Will Perform Grooming: with supervision;sitting Pt Will Perform Upper Body Bathing: with min assist;sitting Pt Will Perform Upper Body Dressing: with min assist;sitting Pt Will Transfer to Toilet: with min assist;stand pivot  transfer;bedside commode Additional ADL Goal #1: Pt will perform bed mobility with supervision as precursor to ADL. Additional ADL Goal #2: Pt will sit with supervision at EOB x 10 minutes while engaged in ADL.  OT Frequency: Min 2X/week   Barriers to D/C:            Co-evaluation PT/OT/SLP Co-Evaluation/Treatment: Yes Reason for Co-Treatment: Complexity of the patient's impairments (multi-system involvement);Necessary to address cognition/behavior during functional activity;For patient/therapist safety   OT goals addressed during session: Strengthening/ROM      End of Session Equipment Utilized During Treatment: Gait belt Nurse Communication: Mobility status  Activity Tolerance: Patient tolerated treatment well Patient left: in chair;with call bell/phone within reach;with family/visitor present   Time: 1125-1203 OT Time Calculation (min): 38 min Charges:  OT General Charges $OT Visit: 1 Procedure OT Evaluation $Initial OT Evaluation Tier I: 1 Procedure OT Treatments $Self Care/Home Management : 8-22 mins G-Codes:    Malka So 09/07/2014, 1:19 PM [phone]

## 2014-09-08 DIAGNOSIS — K5000 Crohn's disease of small intestine without complications: Secondary | ICD-10-CM | POA: Diagnosis present

## 2014-09-08 DIAGNOSIS — N179 Acute kidney failure, unspecified: Secondary | ICD-10-CM | POA: Diagnosis not present

## 2014-09-08 DIAGNOSIS — I469 Cardiac arrest, cause unspecified: Secondary | ICD-10-CM

## 2014-09-08 DIAGNOSIS — D649 Anemia, unspecified: Secondary | ICD-10-CM | POA: Diagnosis present

## 2014-09-08 DIAGNOSIS — R001 Bradycardia, unspecified: Secondary | ICD-10-CM | POA: Diagnosis not present

## 2014-09-08 DIAGNOSIS — R9431 Abnormal electrocardiogram [ECG] [EKG]: Secondary | ICD-10-CM | POA: Diagnosis present

## 2014-09-08 LAB — CBC
HEMATOCRIT: 27.6 % — AB (ref 36.0–46.0)
HEMOGLOBIN: 9.3 g/dL — AB (ref 12.0–15.0)
MCH: 30.4 pg (ref 26.0–34.0)
MCHC: 33.7 g/dL (ref 30.0–36.0)
MCV: 90.2 fL (ref 78.0–100.0)
Platelets: 189 10*3/uL (ref 150–400)
RBC: 3.06 MIL/uL — ABNORMAL LOW (ref 3.87–5.11)
RDW: 14.6 % (ref 11.5–15.5)
WBC: 8.3 10*3/uL (ref 4.0–10.5)

## 2014-09-08 LAB — STOOL CULTURE

## 2014-09-08 LAB — BASIC METABOLIC PANEL
Anion gap: 10 (ref 5–15)
BUN: 24 mg/dL — AB (ref 6–23)
CO2: 29 mEq/L (ref 19–32)
Calcium: 8.8 mg/dL (ref 8.4–10.5)
Chloride: 103 mEq/L (ref 96–112)
Creatinine, Ser: 2.2 mg/dL — ABNORMAL HIGH (ref 0.50–1.10)
GFR calc Af Amer: 34 mL/min — ABNORMAL LOW (ref >90)
GFR, EST NON AFRICAN AMERICAN: 29 mL/min — AB (ref >90)
GLUCOSE: 96 mg/dL (ref 70–99)
Potassium: 4.3 mEq/L (ref 3.7–5.3)
Sodium: 142 mEq/L (ref 137–147)

## 2014-09-08 LAB — MAGNESIUM: Magnesium: 2.5 mg/dL (ref 1.5–2.5)

## 2014-09-08 LAB — PHOSPHORUS: PHOSPHORUS: 2.9 mg/dL (ref 2.3–4.6)

## 2014-09-08 MED ORDER — ACETAMINOPHEN 160 MG/5ML PO SOLN
650.0000 mg | Freq: Four times a day (QID) | ORAL | Status: DC | PRN
Start: 2014-09-08 — End: 2014-09-14
  Administered 2014-09-13: 650 mg via ORAL
  Filled 2014-09-08: qty 20.3

## 2014-09-08 MED ORDER — FAMOTIDINE 20 MG PO TABS
20.0000 mg | ORAL_TABLET | Freq: Every day | ORAL | Status: DC
  Administered 2014-09-08 – 2014-09-13 (×6): 20 mg via ORAL
  Filled 2014-09-08 (×8): qty 1

## 2014-09-08 NOTE — Progress Notes (Signed)
[name]  Julie Saunders MMN:[id number] DOB: 05-Oct-1986 DOA: 08/31/2014 PCP: Donnie Coffin, MD   Brief narrative: 28 year old female patient with known history of Crohn's disease, and pancytopenia from Crohn's medicine which has now resolved who apparently had been having a Crohn's exacerbation at home as manifested by profuse diarrhea for 3-4 days. On 10/22 patient collapsed at home. Family members were present at the time of the arrest/collapse but were unable to provide CPR because of advanced age. She was down at least 10-15 minutes before the fire department arrived and initiated CPR. 15 minutes later EMS arrived and initiated ACLS protocols with CPR for 15 more minutes prior to return of spontaneous circulation. She was defibrillated 4 times for V. fib and received 4 rounds of epinephrine, amiodarone 150 mg, Narcan, D50 and was intubated.  After arrival to the ER patient had altered mentation, tachycardia with episodes of PSVT, posturing and sluggish pupils on exam. Head and C-spine CTs were negative for acute processes. Chest x-ray demonstrated bilateral upper airspace disease worrisome for edema versus infiltrate. Labs revealed lwhite count 19,000, potassium 2.6, ammonia level 312, lactic acid 17.2 and anion gap of 32.  Since admission she has been evaluated by Cardiology. She has undergone 2 echocardiograms which revealed systolic dysfunction with an EF of 20-25%. She has developed bradycardia that is asymptomatic and she also has a prolonged QTc interval. Plans are to pursue cardiac catheterization this week to ensure no congenital anomalous coronary artery anatomy contributing to patient's cardiomyopathy and recent cardiac arrest.  From a neurological standpoint she has exam findings concerning for hypoxemic brain injury. She is alert but is having some issues with expressive aphasia. She also has mild dysphagia. Neurology is following. PT/OT  recommending CIR; evaluation to determine if insurance eligible in process. She has also developed mild acute renal failure which is improving.  She has had persistent diarrhea since admission and Gastroenterology is following and are currently treating with steroids and Pentasa.  STUDIES:  10/16 Colonoscopy (Per [facility]) > active duodenitis with areas of villous blunting/atrophy in addition to a small granuloma with multinucleated giant cells as seen in one of the fragments. Given the history of Crohn's disease, the changes are most compatible with involvement by the same disease process  10/22 CT head > neg  10/22 CT cervical spine >neg  10/22 ECHO > RV normal, LVEF 20-25%  10/23 LE Doppler >neg  10/24 ct>Bowel wall thickening of terminal ileum and significant portions of particularly the distal colon compatible with enteritis, favor Crohn's disease in light of history.  SIGNIFICANT EVENTS:  10/22 Admit after several days diarrhea, cardiac arrest at home with prolonged CPR  10/24- rewarmed, follows commands  10/26: weaned on PS all day  10/27: extubated  10/29 transfered to tele  HPI/Subjective: Awake and sitting up in chair. Primarily complains about the taste of the food. No chest pain or shortness of breath.  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% - plan is to r/o congenital coronary anomaly as contributing - cath next week as long as renal fnx cont to improve - K stable - may need ICD  Hypoxic ischemic encephalopathy (HIE) SLP/PT/OT rec CIR - insurance approval in process  Bradycardia/Prolonged QT interval Per Cards - previous post arrest BB off - hemodynamically stable - HR dips to 20-30 range when asleep and without hypotension  Acute respiratory failure with  hypoxia Required mech ventilation initially - now stable on RA - initial CXR with bilateral mid and upper lobe airspace disease that  could either reflect pneumonia or edema - has been treated with a full course of broad-spectrum antibiotics - most recent chest x-ray 10/27 with persistent left lower lobe consolidation - continue pulmonary toilet  Acute renal failure Baseline in 2014: 6.9/0.8 - at presentation: 4/0.86 - peak: 25/2.93 - currently improving   Pancytopenia History of this due to Crohn's med and was evaluated by Heme/had resolved after that med stopped - developed transient thrombocytopenia this admit with nadir of 96,000 but now resolved  Crohn's duodenitis GI following - continue Solu-Medrol and Pentasa  Chronic Anemia secondary to chronic disease/Crohn's disease Baseline hemoglobin 12.3 to 13 - current hemoglobin 9.3 and likely reflective of critical illness   DVT prophylaxis: Subcutaneous heparin Code Status: Full Family Communication: Father at bedside Disposition Plan/Expected LOS: Telemetry - CIR evaluation process ie awaiting insurance approval - Cardiology plan catheterization next week so once medically stable and if approved can then proceed to CIR   Consultants: Cardiology/Dr. Fransico Him EP/Dr. Virl Axe Gastroenterology/Dr. Wilford Corner CIR/Dr. Mitzi Hansen [facility]  Antibiotics: Zosyn 10/22 >10/29 Vancomycin 10/24 >10/25 Micafungin x 1   Objective: Blood pressure 117/52, pulse 50, temperature 98.7 F (37.1 C), temperature source Oral, resp. rate 16, height 5' 5.5" (1.664 m), weight 168 lb 11.2 oz (76.522 kg), SpO2 95.00%.  Intake/Output Summary (Last 24 hours) at 09/08/14 1200 Last data filed at 09/08/14 0446  Gross per 24 hour  Intake      0 ml  Output      0 ml  Net      0 ml   Exam: Gen: No acute respiratory distress - alert with some difficulty with expressive aphasia but no receptive aphasia Chest: Clear to auscultation bilaterally without wheezes, rhonchi or crackles, room air Cardiac: Regular cardiac rate and rhythm, no rubs murmurs or gallops Abdomen: Soft  nontender nondistended without obvious hepatosplenomegaly, no ascites Extremities: no signif C/C/Edema B LE   Scheduled Meds:  Scheduled Meds: . antiseptic oral rinse  7 mL Mouth Rinse BID  . famotidine  20 mg Oral QHS  . heparin subcutaneous  5,000 Units Subcutaneous 3 times per day  . mesalamine  500 mg Oral TID  . methylPREDNISolone (SOLU-MEDROL) injection  40 mg Intravenous Q12H  . sodium chloride  10-40 mL Intracatheter Q12H    Data Reviewed: Basic Metabolic Panel:  Recent Labs Lab 09/04/14 0515 09/05/14 0400 09/06/14 0411 09/07/14 0400 09/08/14 0315  NA 129* 138 144 146 142  K 3.2* 3.9 3.5* 3.7 4.3  CL 93* 98 98 97 103  CO2 25 31 34* 33* 29  GLUCOSE 91 83 71 87 96  BUN 20 24* 25* 24* 24*  CREATININE 2.49* 2.83* 2.93* 2.54* 2.20*  CALCIUM 7.8* 7.9* 8.2* 8.2* 8.8  MG 2.2 2.1 2.0 1.9 2.5  PHOS  --  3.6 3.5 12.9* 2.9   Liver Function Tests:  Recent Labs Lab 09/02/14 0525 09/03/14 0335 09/04/14 0515  AST 69* 127* 211*  ALT 44* 59* 97*  ALKPHOS 68 88 66  BILITOT 0.4 0.6 0.5  PROT 5.1* 4.4* 4.2*  ALBUMIN 2.1* 1.6* 1.5*   CBC:  Recent Labs Lab 09/02/14 0525  09/03/14 0335 09/04/14 0515 09/05/14 0400 09/06/14 0411 09/07/14 0400 09/08/14 0315  WBC 33.1*  < > 34.8* 27.9* 14.3* 9.4 9.0 8.3  NEUTROABS 29.1*  --  31.3* 23.4*  --   --   --   --  HGB 14.9  < > 11.8* 10.0* 9.8* 9.3* 8.8* 9.3*  HCT 41.6  < > 32.5* 28.4* 28.5* 27.2* 25.7* 27.6*  MCV 84.2  < > 81.9 84.8 85.3 86.9 87.1 90.2  PLT 171  < > 175 152 96* 99* 122* 189  < > = values in this interval not displayed.  CBG:  Recent Labs Lab 09/07/14 0341 09/07/14 0839 09/07/14 1130 09/07/14 1243 09/07/14 1552  GLUCAP 88 110* 181* 115* 150*    Recent Results (from the past 240 hour(s))  MRSA PCR SCREENING     Status: None   Collection Time    08/31/14  3:52 PM      Result Value Ref Range Status   MRSA by PCR NEGATIVE  NEGATIVE Final   Comment:            The GeneXpert MRSA Assay (FDA      approved for NASAL specimens     only), is one component of a     comprehensive MRSA colonization     surveillance program. It is not     intended to diagnose MRSA     infection nor to guide or     monitor treatment for     MRSA infections.  STOOL CULTURE     Status: None   Collection Time    08/31/14 10:00 PM      Result Value Ref Range Status   Specimen Description STOOL   Final   Special Requests NONE   Final   Culture     Final   Value: NO SALMONELLA, SHIGELLA, CAMPYLOBACTER, YERSINIA, OR E.COLI 0157:H7 ISOLATED     Performed at [facility]   Report Status 09/04/2014 FINAL   Final  CLOSTRIDIUM DIFFICILE BY PCR     Status: None   Collection Time    09/01/14  2:37 AM      Result Value Ref Range Status   C difficile by pcr NEGATIVE  NEGATIVE Final  STOOL CULTURE     Status: None   Collection Time    09/04/14  3:43 PM      Result Value Ref Range Status   Specimen Description STOOL   Final   Special Requests NONE   Final   Culture     Final   Value: NO SALMONELLA, SHIGELLA, CAMPYLOBACTER, YERSINIA, OR E.COLI 0157:H7 ISOLATED     Note: REDUCED NORMAL FLORA PRESENT     Performed at [facility]   Report Status 09/08/2014 FINAL   Final     Studies:  Recent x-ray studies have been reviewed in detail by the Attending Physician  Time spent :  [place], ANP [facility] Office  [phone] Pager [phone]   **If unable to reach the above provider after paging please contact the [place] @ [phone]  On-Call/Text Page:      [email or web address]      password [email or web address]  If 7PM-7AM, please contact night-coverage [email or web address] Password [email or web address] 09/08/2014, 12:00 PM   LOS: 8 days   I have personally examined this patient and reviewed the entire database. I have reviewed the above note, made any necessary editorial changes, and agree with its content.  Cherene Altes, MD [facility]

## 2014-09-08 NOTE — Progress Notes (Signed)
Patient with bradycardia to 30's. VSS. Rapid Response called to assess and MD notified. EKG obtained. Awaiting new orders. RN will continue to monitor. Shellee Milo, RN

## 2014-09-08 NOTE — Progress Notes (Signed)
Call received per floor RN for pt HR 25-30s. Pt asymptomatic, VSS otherwise. Dr.Whitlock Cardiac MD notified and no orders received. Upon my arrival pt resting in bed, awake oriented x4, follows commands, denies SOB, CP, palpitations, etc. Heart tones regular, s1 s2 no murmur. HR 40s SB. Labs drawn this am and EKG done. No changes from prior EKG. Floor RN to continue to monitor pt closely. Advised to notify myself and provider for further concerns.

## 2014-09-08 NOTE — Progress Notes (Signed)
Echocardiogram 2D Echocardiogram has been performed.  Julie Saunders 09/08/2014, 10:12 AM

## 2014-09-08 NOTE — Progress Notes (Signed)
Patient resting comfortably in bed in NAD.  Current HR is 48.  RN will continue to monitor. Shellee Milo, RN

## 2014-09-08 NOTE — Progress Notes (Signed)
[facility] PROGRESS NOTE Subjective patient now is on the floor. She has been on IV Solu-Medrol for 2 days. She is still having diarrhea. She has started eating. Her father cannot tell any difference in the diarrhea. Apparently the suggestion is been made for her to go to rehab unit for cognitive injury she still has a bag draining her stool and it's not clear since moving to the floor if this is less under what  Objective: Vital signs in last 24 hours: Temp:  [97.5 F (36.4 C)-98.7 F (37.1 C)] 98.7 F (37.1 C) (10/30 0337) Pulse Rate:  [40-148] 43 (10/30 0337) Resp:  [12-29] 16 (10/30 0337) BP: (90-130)/(53-72) 117/61 mmHg (10/30 0337) SpO2:  [77 %-100 %] 95 % (10/30 0337) Weight:  [76.522 kg (168 lb 11.2 oz)-77.202 kg (170 lb 3.2 oz)] 76.522 kg (168 lb 11.2 oz) (10/30 0423) Last BM Date: 09/07/14  Intake/Output from previous day: 10/29 0701 - 10/30 0700 In: 400 [P.O.:260; I.V.:40; IV Piggyback:100] Out: 50 [Urine:50] Intake/Output this shift:    PE: General--patient sleeping peacefully  Abdomen-- soft nontender good bowel sounds  Lab Results:  Recent Labs  09/06/14 0411 09/07/14 0400 09/08/14 0315  WBC 9.4 9.0 PENDING  HGB 9.3* 8.8* 9.3*  HCT 27.2* 25.7* 27.6*  PLT 99* 122* 189   BMET  Recent Labs  09/06/14 0411 09/07/14 0400 09/08/14 0315  NA 144 146 142  K 3.5* 3.7 4.3  CL 98 97 103  CO2 34* 33* 29  CREATININE 2.93* 2.54* 2.20*   LFT No results found for this basename: PROT, AST, ALT, ALKPHOS, BILITOT, BILIDIR, IBILI,  in the last 72 hours PT/INR No results found for this basename: LABPROT, INR,  in the last 72 hours PANCREAS No results found for this basename: LIPASE,  in the last 72 hours       Studies/Results: No results found.  Medications: I have reviewed the patient's current medications.  Assessment/Plan: 1. Diarrhea/Crohn's disease. Patient has been on Solu-Medrol for a bit over a day. Not really clear for diarrhea is  slowing down at all yet. We'll continue to follow and hopefully will be able to switch over to oral prednisone and Pentasa in the near future.   Siddh Vandeventer JR,Gracey Tolle L 09/08/2014, 6:52 AM

## 2014-09-08 NOTE — Progress Notes (Signed)
Patient Name: Julie Saunders Date of Encounter: 09/08/2014     Active Problems:   Cardiac arrest   Hypokalemia   Acute respiratory failure with hypoxia   Acute encephalopathy   Hypoxic ischemic encephalopathy (HIE)    SUBJECTIVE  Denies any SOB and CP. No dizziness  CURRENT MEDS . antiseptic oral rinse  7 mL Mouth Rinse BID  . famotidine (PEPCID) IV  20 mg Intravenous Q24H  . heparin subcutaneous  5,000 Units Subcutaneous 3 times per day  . mesalamine  500 mg Oral TID  . methylPREDNISolone (SOLU-MEDROL) injection  40 mg Intravenous Q12H  . sodium chloride  10-40 mL Intracatheter Q12H    OBJECTIVE  Filed Vitals:   09/07/14 1935 09/08/14 0159 09/08/14 0337 09/08/14 0423  BP: 90/58 115/59 117/61   Pulse: 55 51 43   Temp:  98.4 F (36.9 C) 98.7 F (37.1 C)   TempSrc:  Oral Oral   Resp: 16 18 16    Height: 5' 5.5" (1.664 m)     Weight: 170 lb 3.2 oz (77.202 kg)   168 lb 11.2 oz (76.522 kg)  SpO2: 98% 99% 95%     Intake/Output Summary (Last 24 hours) at 09/08/14 0854 Last data filed at 09/08/14 0446  Gross per 24 hour  Intake    390 ml  Output      0 ml  Net    390 ml   Filed Weights   09/07/14 0600 09/07/14 1935 09/08/14 0423  Weight: 173 lb 8 oz (78.7 kg) 170 lb 3.2 oz (77.202 kg) 168 lb 11.2 oz (76.522 kg)    PHYSICAL EXAM  General: Pleasant, NAD. Neuro: Alert and oriented X 3. Moves all extremities spontaneously. Psych: Normal affect. HEENT:  Normal  Neck: Supple without bruits or JVD. IJ in place Lungs:  Resp regular and unlabored, CTA. Heart: RRR no s3, s4, or murmurs. Abdomen: Soft, non-tender, non-distended, BS + x 4.  Extremities: No clubbing, cyanosis or edema. DP/PT/Radials 2+ and equal bilaterally.  Accessory Clinical Findings  CBC  Recent Labs  09/07/14 0400 09/08/14 0315  WBC 9.0 8.3  HGB 8.8* 9.3*  HCT 25.7* 27.6*  MCV 87.1 90.2  PLT 122* 923   Basic Metabolic Panel  Recent Labs  09/07/14 0400 09/08/14 0315  NA 146 142   K 3.7 4.3  CL 97 103  CO2 33* 29  GLUCOSE 87 96  BUN 24* 24*  CREATININE 2.54* 2.20*  CALCIUM 8.2* 8.8  MG 1.9 2.5  PHOS 12.9* 2.9    TELE Sinus brady with HR 30-50s, no significant ventricular ectopy    ECG  NSR with diffuse TWI   Echocardiogram 10/26  LV EF: 25% - 30%  ------------------------------------------------------------------- Indications: Cardiac arrest 427.5. Respiratory Failure acute 518.81.  ------------------------------------------------------------------- History: PMH: No prior cardiac history.  ------------------------------------------------------------------- Study Conclusions  - Left ventricle: The cavity size was mildly dilated. Wall thickness was normal. Systolic function was severely reduced. The estimated ejection fraction was in the range of 25% to 30%. Wall motion was normal; there were no regional wall motion abnormalities. Left ventricular diastolic function parameters were normal. - Aortic valve: There was trivial regurgitation. - Mitral valve: There was mild regurgitation. - Left atrium: The atrium was mildly dilated.  Impressions:  - Severe global reduction in LV function (worse towards the apex); probable catheter in RA.      Radiology/Studies  Ct Head Wo Contrast  08/31/2014   CLINICAL DATA:  28YOF was at home, family heard he  collapse in the next room, went to find her and found her unresponsive. Fire department arrived started CPR (was down for about 10-15 mins before CPR was started). EMs arrived noted fine v-fib was defibrillated total of 4 times  EXAM: CT HEAD WITHOUT CONTRAST  CT CERVICAL SPINE WITHOUT CONTRAST  TECHNIQUE: Multidetector CT imaging of the head and cervical spine was performed following the standard protocol without intravenous contrast. Multiplanar CT image reconstructions of the cervical spine were also generated.  COMPARISON:  None.  FINDINGS: CT HEAD FINDINGS  There is no evidence of mass effect,  midline shift or extra-axial fluid collections. There is no evidence of a space-occupying lesion or intracranial hemorrhage. There is no evidence of a cortical-based area of acute infarction.  The ventricles and sulci are appropriate for the patient's age. Incidental note is made of cavum septum pellucidum. The basal cisterns are patent.  Visualized portions of the orbits are unremarkable. The visualized portions of the paranasal sinuses and mastoid air cells are unremarkable.  The osseous structures are unremarkable.  CT CERVICAL SPINE FINDINGS  The alignment is anatomic. The vertebral body heights are maintained. There is no acute fracture. There is no static listhesis. The prevertebral soft tissues are normal. The intraspinal soft tissues are not fully imaged on this examination due to poor soft tissue contrast, but there is no gross soft tissue abnormality.  The disc spaces are maintained.  There are bilateral apical interstitial and alveolar airspace opacities most consistent with pulmonary edema given the patient's history.  IMPRESSION: 1. No acute intracranial pathology. 2. No acute osseous injury of the cervical spine. 3. Biapical interstitial and alveolar airspace opacities most concerning for pulmonary edema given the patient's history.   Electronically Signed   By: Kathreen Devoid   On: 08/31/2014 13:31   Ct Cervical Spine Wo Contrast  08/31/2014   CLINICAL DATA:  28YOF was at home, family heard he collapse in the next room, went to find her and found her unresponsive. Fire department arrived started CPR (was down for about 10-15 mins before CPR was started). EMs arrived noted fine v-fib was defibrillated total of 4 times  EXAM: CT HEAD WITHOUT CONTRAST  CT CERVICAL SPINE WITHOUT CONTRAST  TECHNIQUE: Multidetector CT imaging of the head and cervical spine was performed following the standard protocol without intravenous contrast. Multiplanar CT image reconstructions of the cervical spine were also  generated.  COMPARISON:  None.  FINDINGS: CT HEAD FINDINGS  There is no evidence of mass effect, midline shift or extra-axial fluid collections. There is no evidence of a space-occupying lesion or intracranial hemorrhage. There is no evidence of a cortical-based area of acute infarction.  The ventricles and sulci are appropriate for the patient's age. Incidental note is made of cavum septum pellucidum. The basal cisterns are patent.  Visualized portions of the orbits are unremarkable. The visualized portions of the paranasal sinuses and mastoid air cells are unremarkable.  The osseous structures are unremarkable.  CT CERVICAL SPINE FINDINGS  The alignment is anatomic. The vertebral body heights are maintained. There is no acute fracture. There is no static listhesis. The prevertebral soft tissues are normal. The intraspinal soft tissues are not fully imaged on this examination due to poor soft tissue contrast, but there is no gross soft tissue abnormality.  The disc spaces are maintained.  There are bilateral apical interstitial and alveolar airspace opacities most consistent with pulmonary edema given the patient's history.  IMPRESSION: 1. No acute intracranial pathology. 2. No acute  osseous injury of the cervical spine. 3. Biapical interstitial and alveolar airspace opacities most concerning for pulmonary edema given the patient's history.   Electronically Signed   By: Kathreen Devoid   On: 08/31/2014 13:31   Ct Abdomen Pelvis W Contrast  09/02/2014   CLINICAL DATA:  Patient with Crohn's disease and acute sepsis, cardiac arrest, high suspicion of intra-abdominal abscess due to Crohn's and sepsis, impaired renal function  EXAM: CT ABDOMEN AND PELVIS WITH CONTRAST  TECHNIQUE: Multidetector CT imaging of the abdomen and pelvis was performed using the standard protocol following bolus administration of intravenous contrast. Sagittal and coronal MPR images reconstructed from axial data set.  CONTRAST:  68m OMNIPAQUE  IOHEXOL 300 MG/ML SOLN. Dilute oral contrast.  COMPARISON:  None  FINDINGS: BILATERAL pleural effusions and basilar volume loss with consolidation in both lower lobes.  Diffuse fatty infiltration of liver.  Scattered beam hardening artifacts from inclusion of patient's arms in imaged field.  Liver, spleen, pancreas, kidneys, and adrenal glands otherwise normal.  Diffuse wall thickening of the terminal ileum and multiple sites of the colon consistent with history of Crohn's disease.  Air and Foley catheter within decompressed urinary bladder.  Unremarkable uterus and adnexae for age.  Significant free fluid in abdomen and pelvis including perihepatic and perisplenic.  No definite free intraperitoneal air identified however.  Rectal tube and Foley catheter present.  No mass, adenopathy, or hernia.  No acute osseous findings.  Gallbladder appears distended without calcification.  Stomach and remaining bowel loops unremarkable.  Scattered subcutaneous edema.  IMPRESSION: Bowel wall thickening of terminal ileum and significant portions of particularly the distal colon compatible with enteritis, favor Crohn's disease in light of history.  Bibasilar effusions, atelectasis and suspected lower lobe consolidation.  Associated significant ascites but no discrete abscess collection or free air identified.  Scattered subcutaneous edema and soft tissue edema which may be due to hypoproteinemia, anasarca or fluid overload.   Electronically Signed   By: MLavonia DanaM.D.   On: 09/02/2014 14:38   [place]  09/05/2014   CLINICAL DATA:  Renal failure. History of Crohn's disease. Pancytopenia history.  EXAM: RENAL/URINARY TRACT ULTRASOUND COMPLETE  COMPARISON:  CT, 09/02/2014.  FINDINGS: Right Kidney:  Length: 12.4 cm. Echogenicity within normal limits. No mass or hydronephrosis visualized.  Left Kidney:  Length: 11.8 cm. Echogenicity within normal limits. No mass or hydronephrosis visualized.  Bladder:  Mildly distended.   Foley catheter in place, otherwise unremarkable.  Small left pleural effusion.  IMPRESSION: Normal renal ultrasound.  No hydronephrosis.   Electronically Signed   By: DLajean ManesM.D.   On: 09/05/2014 13:35   Dg Chest Port 1 View  09/05/2014   CLINICAL DATA:  Hypoxia  EXAM: PORTABLE CHEST - 1 VIEW  COMPARISON:  September 04, 2014  FINDINGS: Endotracheal tube tip is 6.5 cm above the carina. Central catheter tip is at the cavoatrial junction level. Nasogastric tube tip and side port are below the diaphragm. No pneumothorax. There is persistent left lower lobe consolidation. Right lung is clear. Heart is upper normal in size with pulmonary vascularity within normal limits. No adenopathy. No bone lesions.  IMPRESSION: Tube and catheter positions as described without pneumothorax. Persistent left lower lobe consolidation. No new opacity. No change in cardiac silhouette.   Electronically Signed   By: WLowella GripM.D.   On: 09/05/2014 07:30   Dg Chest Port 1 View  09/04/2014   CLINICAL DATA:  Cardiac arrest.  Assess support apparatus.  EXAM: PORTABLE CHEST - 1 VIEW  COMPARISON:  09/03/2014.  FINDINGS: Support apparatus: Unchanged.  Cardiomediastinal Silhouette:  Unchanged.  Lungs: Slight improvement in bilateral airspace disease compatible with pulmonary edema. Some of the airspace disease has shifted. More prominent basilar atelectasis. No pneumothorax.  Effusions:  None.  Other: LEFT costophrenic angle excluded from view. Defibrillator pads remain present.  IMPRESSION: 1. Unchanged support apparatus. 2. Shifting airspace disease compatible with pulmonary edema.   Electronically Signed   By: Dereck Ligas M.D.   On: 09/04/2014 07:54   Dg Chest Port 1 View  09/03/2014   CLINICAL DATA:  28 year old female with acute respiratory failure.  EXAM: PORTABLE CHEST - 1 VIEW  COMPARISON:  Chest x-ray 09/02/2014.  FINDINGS: An endotracheal tube is in place with tip 2.3 cm above the carina. There is a left-sided  internal jugular central venous catheter with tip terminating in the right atrium approximately 1.5 cm distal to the superior cavoatrial junction. A nasogastric tube is seen extending into the stomach, however, the tip of the nasogastric tube extends below the lower margin of the image. Transcutaneous defibrillator pads project over the lower thorax and upper abdomen on the left. Lung volumes are low. Patchy bibasilar opacities are noted, compatible with a combination of atelectasis and/or consolidation. Small bilateral pleural effusions. There is a lucency and peripheral linear opacity in the lateral aspect of the right mid to lower lung, favored to represent a skin fold. Heart size is normal. The patient is rotated to the left on today's exam, resulting in distortion of the mediastinal contours and reduced diagnostic sensitivity and specificity for mediastinal pathology.  IMPRESSION: 1. Support apparatus, as above. 2. Persistent multifocal patchy opacities throughout the mid to lower lungs bilaterally, compatible with a combination of atelectasis and/or consolidation, with superimposed small bilateral pleural effusions. 3. New low vertically oriented lucency and opacity in the periphery of the right lung compatible with a skin fold artifact. This is present in a different configuration on the subsequent follow-up chest x-ray obtained at 6:27 a.m.   Electronically Signed   By: Vinnie Langton M.D.   On: 09/03/2014 07:21   Dg Chest Port 1 View  09/03/2014   CLINICAL DATA:  Adjustment of endotracheal tube.  EXAM: PORTABLE CHEST - 1 VIEW  COMPARISON:  09/03/2014 at 4:19 a.m.  FINDINGS: Endotracheal tube tip projects 1 cm above the carina. This is without significant change from the earlier exam.  Left internal jugular central venous line tip lies at the caval atrial junction, stable. Nasogastric tube passes below the diaphragm into the stomach.  Cardiac silhouette is normal in size. Normal mediastinal and hilar  contours.  Lung base opacity, most evident on the left, may all be atelectasis. Pneumonia is possible. Hazy airspace opacity noted on the previous day's study, most evident in the right upper lobe, has mildly improved. This suggest improved pulmonary edema. No pneumothorax.  IMPRESSION: 1. Endotracheal tube is well positioned. Tip projects 1 cm above the carina. Remaining support apparatus is stable and well positioned. 2. Remaining support apparatus is also well positioned. 3. Left lung base opacity stable from the previous day's exam, likely atelectasis. Pneumonia is possible. Other areas of lung opacity that is likely due to asymmetric edema mildly improved.   Electronically Signed   By: Lajean Manes M.D.   On: 09/03/2014 07:10   Dg Chest Port 1 View  09/02/2014   CLINICAL DATA:  Acute respiratory failure.  Hypoxemia.  EXAM: PORTABLE CHEST - 1 VIEW  COMPARISON:  08/31/2014.  FINDINGS: Endotracheal tube tip now projects at the origin of the right mainstem bronchus, within the right margin of the carina. Recommend retracting 1-2 cm for more optimal positioning.  Nasogastric tube extends below the diaphragm into the stomach. Right internal jugular central venous line tip lies just above the caval atrial junction.  Right upper lobe airspace opacity is less dense than on the prior study. Left upper lobe airspace opacity seen previously has mostly resolved. There is also perihilar and lower lung zone airspace opacity, greatest at the left lung base, increased from the prior exam. Lung findings may reflect pulmonary edema with an altered pattern secondary to differences in patient positioning. This is favored. Multifocal pneumonia is felt less likely.  Cardiac silhouette is normal in size. No mediastinal or hilar masses. No pneumothorax.  IMPRESSION: 1. Endotracheal tube tip now lies at the origin the right mainstem bronchus. It does not fully into the right mainstem bronchus. Recommend retracting 1-2 cm. Critical  Value/emergent results were called by telephone at the time of interpretation on 09/02/2014 at 8:31 am to this patient's nurse, who verbally acknowledged these results. 2. Changing pattern of lung infiltrates as detailed above. This supports pulmonary edema as the etiology of the pulmonary infiltrates. Upper lung airspace opacities have improved, while there are new lower lung zone airspace opacities.   Electronically Signed   By: Lajean Manes M.D.   On: 09/02/2014 08:32   Dg Chest Port 1 View  08/31/2014   CLINICAL DATA:  Central line placement, endotracheal tube placement  EXAM: PORTABLE CHEST - 1 VIEW  COMPARISON:  08/31/2014  FINDINGS: Endotracheal tube with the tip 1.7 above the carina. Left jugular central venous catheter with the tip projecting over the cavoatrial junction. Nasogastric 2 projecting over the stomach.  Bilateral patchy upper lobe interstitial and alveolar airspace opacities. No pleural effusion or pneumothorax. Stable cardiomediastinal silhouette. Unremarkable osseous structures.  IMPRESSION: 1. Endotracheal tube with the tip 1.7 cm above the carina. 2. Left jugular central venous catheter with the tip projecting over the cavoatrial junction. No pneumothorax. 3. Bilateral upper lobe interstitial and alveolar airspace opacities which may be secondary to pulmonary edema versus pneumonia.   Electronically Signed   By: Kathreen Devoid   On: 08/31/2014 14:40   Dg Chest Portable 1 View  08/31/2014   CLINICAL DATA:  Cardiac arrest.  Endotracheal tube placement.  EXAM: PORTABLE CHEST - 1 VIEW  COMPARISON:  None.  FINDINGS: Endotracheal tube is in place with tip 1.9 cm above the carina. NG tube courses into the stomach and below the inferior margin of the film. Bilateral perihilar and upper lobe airspace opacities are identified. Heart size is normal.  IMPRESSION: ET tube and NG tube project in good position.  Bilateral mid and upper lobe airspace disease could be due to pneumonia or pulmonary  edema.   Electronically Signed   By: Inge Rise M.D.   On: 08/31/2014 13:02   Dg Abd Portable 1v  09/01/2014   CLINICAL DATA:  Abdominal pain, history of pancytopenia and acute respiratory failure  EXAM: PORTABLE ABDOMEN - 1 VIEW  COMPARISON:  None.  FINDINGS: A single supine abdominal film reveals a relative paucity of bowel gas. There is some gas in the stomach. There is an esophagogastric tube present with the tip in the mid portion of the gastric body. External pacing-defibrillation leads are present. A tubular structure projects to the right of the upper lumbar spine and may reflect a drain  or vascular catheter.  IMPRESSION: The bowel gas pattern is nonspecific with no evidence of obstruction or ileus.   Electronically Signed   By: David  Martin[place]   On: 09/01/2014 11:51    ASSESSMENT AND PLAN  29 yo female admitted 10/22 after collapse with subsequent cardiac/vfib arrest at home and prolonged CPR (down time 10-15 min before CPR initiated, CPR for 15 min). S/p defibrillation x4, no strip available. Underwent intubation and therapeutic hypothermia protocol. Initial K 2.6, prolonged QT on EKG. GI consulted as pt has recent biopsy showing Crohn's disease. Echo shows EF 25-30%, cardiology consulted.  1. Cardiac arrest - reported V-fib arrest, however no strip  - unable to add BB due to bradycardia  - Echo 08/31/2014 EF 25-30%  - Echo 09/04/2014 EF 25-30%, no RWMA, mild MR  - pending repeat echo today  - once renal function improve, plan for cardiac catheterization. EP following, may need ICD, however need to r/o underlying ischemia, also need to make sure no anomalous coronary artery anatomy. (per Dr. Caryl Comes, [facility] scan may be sufficient, however likely won't give [place] info on coronary ostia). Cr 2.2 improving, likely cath early next wk  2. Prolonged QTc: improved, however does have diffuse TWI on EKG 3. Acute systolic heart failure 4. Elevated troponin: likely in the setting of CPR 5. Anoxic  encephalopathy: good neural recovery despite prolonged down time 6. Hypokalemia - repleted 7. Acute renal insufficiency: likely related to cardiac arrest - renal function improving 8. Asymptomatic bradycardia: avoid AV nodal blocking agent  Signed, Woodward Ku Pager: [phone]  I have personally seen and examined patient and agree with note as outlined by Almyra Deforest, PA-C.  Renal function slowly improving.  Potassium level ok.  Plan for cath hopefully early next week pending improvement in  Renal function.  Would avoid AV nodal blocking agents in setting of bradycardia.  Signed: Fransico Him, MD [facility] 09/08/2014

## 2014-09-08 NOTE — Evaluation (Signed)
Occupational Therapy Evaluation Patient Details Name: Julie Saunders MRN: [id number] DOB: February 23, 1986 Today's Date: 09/08/2014    History of Present Illness Pt adm after cardiac arrest at home likely due to low K due to diarrhea. Pt with prolonged down time. Hypothermia protocol. Pt extubated 10/27. Pt with history of Crohns.   Clinical Impression   Pt demonstrating improved bed mobility for rolling to assist with bathing and dressing.  Focused on UE coordination in supported sitting with HOB up reaching for ADL items in multiple planes.  Pt able to use clock to orient to date and time.  She was able to state that tomorrow was Halloween.  She followed one step simple commands with increased time. She demonstrated ability to answer the phone, call for nursing, and activate the tv and change channels independently, but she was unable to place a call.    Follow Up Recommendations  CIR    Equipment Recommendations       Recommendations for Other Services       Precautions / Restrictions Precautions Precautions: Fall Precaution Comments: flexiseal      Mobility Bed Mobility Overal bed mobility: Needs Assistance Bed Mobility: Rolling;Sidelying to Sit Rolling: Min assist Sidelying to sit: Min assist       General bed mobility comments: Verbal/tactile cues for initiation and sequence. Incr time to perform. Assist to bring trunk up and hips to EOB.  Transfers Overall transfer level: Needs assistance Equipment used: Ambulation equipment used (Stedy)   Sit to Stand: Mod assist Stand pivot transfers: Mod assist       General transfer comment: Verbal cues for hand placement and technique. Assist for balance and to bring hips up. Pt with knees hyperextended in standing on the Stedy and with rapid descent to chair when she begins flexing knees to sit.    Balance   Sitting-balance support: Feet supported;Bilateral upper extremity supported Sitting balance-Leahy Scale: Poor Sitting  balance - Comments: Sat EOB x 10 minutes with min A. Pt with trunk sway and difficulty finding midline.   Standing balance support: Bilateral upper extremity supported Standing balance-Leahy Scale: Poor Standing balance comment: Static standing with Stedy or walker with min A.                            ADL Overall ADL's : Needs assistance/impaired     Grooming: Wash/dry hands;Moderate assistance;Bed level Grooming Details (indicate cue type and reason): used her R hand         Upper Body Dressing : Maximal assistance;Bed level                     General ADL Comments: Assisted NT with bathing and dressing at bed level.     Vision                     Perception     Praxis      Pertinent Vitals/Pain Pain Assessment: No/denies pain     Hand Dominance     Extremity/Trunk Assessment             Communication     Cognition Arousal/Alertness: Awake/alert Behavior During Therapy: Impulsive Overall Cognitive Status: Impaired/Different from baseline Area of Impairment: Orientation;Attention;Following commands;Safety/judgement;Awareness;Problem solving Orientation Level: Disoriented to;Time;Situation Current Attention Level: Sustained Memory: Decreased short-term memory Following Commands: Follows one step commands inconsistently (requires cues) Safety/Judgement: Decreased awareness of safety;Decreased awareness of deficits   Problem Solving: Slow processing;Decreased  initiation;Difficulty sequencing;Requires verbal cues;Requires tactile cues     General Comments       Exercises Exercises:  (worked on reaching in multiple planes, grading movement )     Shoulder Instructions      Home Living                                          Prior Functioning/Environment               OT Diagnosis:     OT Problem List:     OT Treatment/Interventions:      OT Goals(Current goals can be found in the care plan  section) Acute Rehab OT Goals Patient Stated Goal: Pt wants to go home. Time For Goal Achievement: 09/21/14  OT Frequency: Min 2X/week   Barriers to D/C:            Co-evaluation              End of Session    Activity Tolerance: Patient tolerated treatment well Patient left: in bed;with call bell/phone within reach;with bed alarm set   Time: 1683-7290 OT Time Calculation (min): 24 min Charges:  OT General Charges $OT Visit: 1 Procedure OT Treatments $Self Care/Home Management : 8-22 mins $Neuromuscular Re-education: 8-22 mins G-Codes:    Malka So 09/08/2014, 2:53 PM

## 2014-09-08 NOTE — Progress Notes (Addendum)
Rehab admissions - I met with patient and her dad.  Patient will cognitive impairment and delayed responses.  Patient and dad are in agreement to inpatient rehab.  I gave them rehab booklets.  I will open case with [employer] and attepmt to get authorization for acute inpatient rehab admission.  Call me for questions.  #[phone]  Noted plans for cardiac cath possibly early next week.  I will hold on seeking authorization until after cardiac cath and workup is completed.  Call me for questions.  #[phone]

## 2014-09-08 NOTE — Progress Notes (Signed)
Speech Language Pathology Treatment: Dysphagia;Cognitive-Linquistic  Patient Details Name: Julie Saunders MRN: [id number] DOB: [birth year] Today's Date: 09/08/2014 Time: 3532-9924 SLP Time Calculation (min): 19 min  Assessment / Plan / Recommendation Clinical Impression  Upon SLP arrival, pt was being transferred from chair to bed, and followed one-step commands with Min cues and faster response time as compared to previous session with this SLP. Pt tolerated advanced solids and thin liquids with Min cues from SLP for sustained attention to self-feeding task. No overt s/s of aspiration observed. Recommend to advance diet to Dys 3 and thin liquids.   HPI HPI: 28 y/o female admitted 10/22 after VFib arrest at home and prolonged CPR (prolonged down time) when presenting to the ER.  Etiology felt to be hypokalemia from diarrhea.  To ICU for hypothermia protocol. No h/o swallowing difficulty reported.   Pertinent Vitals Pain Assessment: No/denies pain  SLP Plan  Continue with current plan of care    Recommendations Diet recommendations: Dysphagia 3 (mechanical soft);Thin liquid Liquids provided via: Cup Medication Administration: Whole meds with puree Supervision: Full supervision/cueing for compensatory strategies;Patient able to self feed;Staff to assist with self feeding Compensations: Slow rate;Small sips/bites;Check for pocketing;Check for anterior loss Postural Changes and/or Swallow Maneuvers: Seated upright 90 degrees              Oral Care Recommendations: Oral care BID Follow up Recommendations: Inpatient Rehab Plan: Continue with current plan of care    GO      Germain Osgood, M.A. CCC-SLP [phone]  Germain Osgood 09/08/2014, 1:13 PM

## 2014-09-08 NOTE — Progress Notes (Addendum)
Physical Therapy Treatment Patient Details Name: Julie Saunders MRN: [id number] DOB: [birth year] Today's Date: 09/08/2014    History of Present Illness Pt adm after cardiac arrest at home likely due to low K due to diarrhea. Pt with prolonged down time. Hypothermia protocol. Pt extubated 10/27. Pt with history of Crohns.    PT Comments    Pt making steady progress but still with significant motor and cognitive deficits. Needs CIR.  Follow Up Recommendations  CIR     Equipment Recommendations  Other (comment) (to be determined)    Recommendations for Other Services       Precautions / Restrictions Precautions Precautions: Fall Precaution Comments: flexiseal    Mobility  Bed Mobility Overal bed mobility: Needs Assistance Bed Mobility: Rolling;Sidelying to Sit Rolling: Min assist Sidelying to sit: Min assist       General bed mobility comments: Verbal/tactile cues for initiation and sequence. Incr time to perform. Assist to bring trunk up and hips to EOB.  Transfers Overall transfer level: Needs assistance Equipment used: Ambulation equipment used (Stedy)   Sit to Stand: Mod assist Stand pivot transfers: Mod assist       General transfer comment: Verbal cues for hand placement and technique. Assist for balance and to bring hips up. Pt with knees hyperextended in standing on the Stedy and with rapid descent to chair when she begins flexing knees to sit.  Ambulation/Gait                 Stairs            Wheelchair Mobility    Modified Rankin (Stroke Patients Only)       Balance   Sitting-balance support: Feet supported;Bilateral upper extremity supported Sitting balance-Leahy Scale: Poor Sitting balance - Comments: Sat EOB x 10 minutes with min A. Pt with trunk sway and difficulty finding midline.   Standing balance support: Bilateral upper extremity supported Standing balance-Leahy Scale: Poor Standing balance comment: Static standing with  Stedy or walker with min A. Stood with walker x 2 for 3 minutes each. Pt with unpredictable movements in standing. Instructed to stand staticly but pt picking up one foot at times or stepping forward at times. Also lifts walker up instead of keeping it on ground. At times pt with heavy anterior lean. Knees in hyperextension. Attempted to have pt perform mini-squats but pt with difficulty following complex instructions.                    Cognition Arousal/Alertness: Awake/alert Behavior During Therapy: Impulsive Overall Cognitive Status: Impaired/Different from baseline Area of Impairment: Orientation;Attention;Following commands;Safety/judgement;Awareness;Problem solving Orientation Level: Disoriented to;Time;Situation Current Attention Level: Sustained Memory: Decreased short-term memory Following Commands: Follows one step commands inconsistently (requires cues) Safety/Judgement: Decreased awareness of safety;Decreased awareness of deficits   Problem Solving: Slow processing;Decreased initiation;Difficulty sequencing;Requires verbal cues;Requires tactile cues      Exercises      General Comments        Pertinent Vitals/Pain Pain Assessment: No/denies pain    Home Living                      Prior Function            PT Goals (current goals can now be found in the care plan section) Progress towards PT goals: Progressing toward goals    Frequency  Min 3X/week    PT Plan Current plan remains appropriate    Co-evaluation  End of Session Equipment Utilized During Treatment: Gait belt;Other (comment) Charlaine Dalton) Activity Tolerance: Patient tolerated treatment well Patient left: in chair;with call bell/phone within reach;with family/visitor present;with chair alarm set     Time: 6484-7207 PT Time Calculation (min): 30 min  Charges:  $Gait Training: 8-22 mins $Therapeutic Activity: 8-22 mins                    G Codes:       Connor Meacham 09-Sep-2014, 2:53 PM  [facility] PT [phone]

## 2014-09-09 DIAGNOSIS — K50018 Crohn's disease of small intestine with other complication: Secondary | ICD-10-CM

## 2014-09-09 DIAGNOSIS — N170 Acute kidney failure with tubular necrosis: Secondary | ICD-10-CM

## 2014-09-09 LAB — COMPREHENSIVE METABOLIC PANEL
ALBUMIN: 2 g/dL — AB (ref 3.5–5.2)
ALT: 75 U/L — ABNORMAL HIGH (ref 0–35)
ANION GAP: 11 (ref 5–15)
AST: 21 U/L (ref 0–37)
Alkaline Phosphatase: 64 U/L (ref 39–117)
BUN: 23 mg/dL (ref 6–23)
CALCIUM: 9 mg/dL (ref 8.4–10.5)
CO2: 29 mEq/L (ref 19–32)
CREATININE: 1.94 mg/dL — AB (ref 0.50–1.10)
Chloride: 104 mEq/L (ref 96–112)
GFR calc Af Amer: 40 mL/min — ABNORMAL LOW (ref >90)
GFR calc non Af Amer: 34 mL/min — ABNORMAL LOW (ref >90)
Glucose, Bld: 87 mg/dL (ref 70–99)
Potassium: 4.4 mEq/L (ref 3.7–5.3)
Sodium: 144 mEq/L (ref 137–147)
TOTAL PROTEIN: 5.7 g/dL — AB (ref 6.0–8.3)
Total Bilirubin: 0.3 mg/dL (ref 0.3–1.2)

## 2014-09-09 LAB — CBC
HCT: 27.6 % — ABNORMAL LOW (ref 36.0–46.0)
HEMOGLOBIN: 9.2 g/dL — AB (ref 12.0–15.0)
MCH: 29.6 pg (ref 26.0–34.0)
MCHC: 33.3 g/dL (ref 30.0–36.0)
MCV: 88.7 fL (ref 78.0–100.0)
Platelets: 231 10*3/uL (ref 150–400)
RBC: 3.11 MIL/uL — ABNORMAL LOW (ref 3.87–5.11)
RDW: 15 % (ref 11.5–15.5)
WBC: 6.5 10*3/uL (ref 4.0–10.5)

## 2014-09-09 LAB — PHOSPHORUS: PHOSPHORUS: 4.5 mg/dL (ref 2.3–4.6)

## 2014-09-09 LAB — MAGNESIUM: Magnesium: 2.2 mg/dL (ref 1.5–2.5)

## 2014-09-09 MED ORDER — RISAQUAD PO CAPS
2.0000 | ORAL_CAPSULE | Freq: Every day | ORAL | Status: DC
  Administered 2014-09-09 – 2014-09-14 (×6): 2 via ORAL
  Filled 2014-09-09 (×6): qty 2

## 2014-09-09 NOTE — Progress Notes (Addendum)
       Patient Name: Julie Saunders Date of Encounter: 09/09/2014    SUBJECTIVE: Not much conversation. No complaints. Husband is present.  TELEMETRY:  Sinus rhythm. Filed Vitals:   09/08/14 2027 09/08/14 2135 09/09/14 0637 09/09/14 1027  BP: 121/63 125/68 139/69 107/48  Pulse: 46 39 42 53  Temp: 98 F (36.7 C)  98 F (36.7 C) 97.8 F (36.6 C)  TempSrc: Oral  Oral Oral  Resp: 18 18 18    Height:      Weight:   170 lb 1.6 oz (77.157 kg)   SpO2: 99% 97% 99% 99%    Intake/Output Summary (Last 24 hours) at 09/09/14 1217 Last data filed at 09/09/14 0935  Gross per 24 hour  Intake    300 ml  Output      0 ml  Net    300 ml   LABS: Basic Metabolic Panel:  Recent Labs  09/08/14 0315 09/09/14 0638  NA 142 144  K 4.3 4.4  CL 103 104  CO2 29 29  GLUCOSE 96 87  BUN 24* 23  CREATININE 2.20* 1.94*  CALCIUM 8.8 9.0  MG 2.5 2.2  PHOS 2.9 4.5   CBC:  Recent Labs  09/08/14 0315 09/09/14 0642  WBC 8.3 6.5  HGB 9.3* 9.2*  HCT 27.6* 27.6*  MCV 90.2 88.7  PLT 189 231   BNP    Component Value Date/Time   PROBNP 43778.0* 09/06/2014 0411    Radiology/Studies:  No new data  Physical Exam: Blood pressure 107/48, pulse 53, temperature 97.8 F (36.6 C), temperature source Oral, resp. rate 18, height 5' 5.5" (1.664 m), weight 170 lb 1.6 oz (77.157 kg), SpO2 99.00%. Weight change: -1.6 oz (-0.045 kg)  Wt Readings from Last 3 Encounters:  09/09/14 170 lb 1.6 oz (77.157 kg)  02/24/13 199 lb 3.2 oz (90.357 kg)  11/23/12 189 lb 15.4 oz (86.165 kg)   No gallop on cardiac exam lungs clear anteriorly. Patient lying flat without difficulty Neurologically, she is pretty expressionless and volunteers no words our conversation. She is lying in bed with a cell phone to her a ear but not speaking or having a conversation.  ASSESSMENT:  1. Acute systolic heart failure of uncertain cause  2. Ventricular fibrillation cardiac arrest. Patient status post resuscitation  3.  Anoxic brain injury  4. Acute kidney injury, gradually improving   Plan:  Continue slow recovery. Cath over the next several days if/when renal function allows [place] to be done safely. May write Orders in a.m. if it appears that renal function is recovering sufficiently to allow procedure to be done on Monday otherwise we'll wait to Tuesday or Thursday.  Demetrios Isaacs 09/09/2014, 12:17 PM

## 2014-09-09 NOTE — Progress Notes (Signed)
[facility] PROGRESS NOTE Interim History: 28 year old female patient with known history of Crohn's disease, and pancytopenia from Crohn's medicine which has now resolved who apparently had been having a Crohn's exacerbation at home as manifested by profuse diarrhea for 3-4 days. On 10/22 patient collapsed at home. She was down at least 10-15 minutes before the fire department arrived and initiated CPR. 15 minutes later EMS arrived and initiated ACLS protocols with CPR for 15 more minutes prior to return of spontaneous circulation. She was defibrillated 4 times for V. fib .evaluated by Cardiology. She has undergone 2 echocardiograms which revealed systolic dysfunction with an EF of 20-25%.Plans are to pursue cardiac catheterization this week to ensure no congenital anomalous. neurological standpoint she has exam findings concerning for hypoxemic brain injury. She is alert but is having some issues with expressive aphasia. She also has mild dysphagia. Neurology is following. PT/OT recommending CIR   Filed Weights   09/07/14 1935 09/08/14 0423 09/09/14 0637  Weight: 77.202 kg (170 lb 3.2 oz) 76.522 kg (168 lb 11.2 oz) 77.157 kg (170 lb 1.6 oz)        Intake/Output Summary (Last 24 hours) at 09/09/14 0948 Last data filed at 09/09/14 0935  Gross per 24 hour  Intake    300 ml  Output      0 ml  Net    300 ml     Assessment/Plan: Acute respiratory failure with hypoxia in the setting of Cardiac arrest (VF): - post arrest CM with EF 25% - plan is to r/o congenital coronary anomaly as contributing - cath next week  - Required mech ventilation initially  - now stable on RA  - Initial CXR with bilateral lobe airspace disease, completed course of antibiotics in house.   Hypoxic ischemic encephalopathy (HIE): - SLP/PT/OT rec CIR - insurance approval in process  Bradycardia/Prolonged QT interval  - Per Cards - previous post arrest BB off  - hemodynamically stable   Acute renal failure    - Baseline in Ct < 1.0. - was as high as 3.0, improving 1.9  Pancytopenia  History of this due to Crohn's med and was evaluated by Heme/had resolved   Crohn's duodenitis  - GI following - continue Solu-Medrol and Pentasa  - Not really clear for diarrhea is slowing down   Chronic Anemia secondary to chronic disease/Crohn's disease  Baseline hemoglobin 12.3 to 13      DVT prophylaxis: Subcutaneous heparin  Code Status: Full  Family Communication: Father at bedside  Disposition Plan/Expected LOS: Telemetry - CIR evaluation    Consultants:  Cardiology/Dr. Fransico Him  EP/Dr. Virl Axe  Gastroenterology/Dr. Wilford Corner  CIR/Dr. Mitzi Hansen [facility]  Antibiotics:  Zosyn 10/22 >10/29  Vancomycin 10/24 >10/25  Micafungin x 1     STUDIES:  10/16 Colonoscopy (Per [facility]) > active duodenitis with areas of villous blunting/atrophy in addition to a small granuloma with multinucleated giant cells as seen in one of the fragments. Given the history of Crohn's disease, the changes are most compatible with involvement by the same disease process  10/22 CT head > neg  10/22 CT cervical spine >neg  10/22 ECHO > RV normal, LVEF 20-25%  10/23 LE Doppler >neg  10/24 ct>Bowel wall thickening of terminal ileum and significant portions of particularly the distal colon compatible with enteritis, favor Crohn's disease in light of history.  SIGNIFICANT EVENTS:  10/22 Admit after several days diarrhea, cardiac arrest at home with prolonged CPR  10/24- rewarmed, follows commands  10/26:  weaned on PS all day  10/27: extubated  10/29 transfered to tele   HPI/Subjective: No complains  Objective: Filed Vitals:   09/08/14 1559 09/08/14 2027 09/08/14 2135 09/09/14 0637  BP: 113/62 121/63 125/68 139/69  Pulse: 47 46 39 42  Temp: 97.6 F (36.4 C) 98 F (36.7 C)  98 F (36.7 C)  TempSrc: Oral Oral  Oral  Resp: 18 18 18 18   Height:      Weight:    77.157 kg (170 lb 1.6 oz)  SpO2: 96%  99% 97% 99%     Exam:  General: Alert, awake, oriented x3. HEENT: No bruits, no goiter.  Heart: Regular rate and rhythm., without murmurs, rubs, gallops.  Lungs: Good air movement, clear Abdomen: Soft, nontender, nondistended, positive bowel sounds.    Data Reviewed: Basic Metabolic Panel:  Recent Labs Lab 09/05/14 0400 09/06/14 0411 09/07/14 0400 09/08/14 0315 09/09/14 0638  NA 138 144 146 142 144  K 3.9 3.5* 3.7 4.3 4.4  CL 98 98 97 103 104  CO2 31 34* 33* 29 29  GLUCOSE 83 71 87 96 87  BUN 24* 25* 24* 24* 23  CREATININE 2.83* 2.93* 2.54* 2.20* 1.94*  CALCIUM 7.9* 8.2* 8.2* 8.8 9.0  MG 2.1 2.0 1.9 2.5 2.2  PHOS 3.6 3.5 12.9* 2.9 4.5   Liver Function Tests:  Recent Labs Lab 09/03/14 0335 09/04/14 0515 09/09/14 0638  AST 127* 211* 21  ALT 59* 97* 75*  ALKPHOS 88 66 64  BILITOT 0.6 0.5 0.3  PROT 4.4* 4.2* 5.7*  ALBUMIN 1.6* 1.5* 2.0*   No results found for this basename: LIPASE, AMYLASE,  in the last 168 hours No results found for this basename: AMMONIA,  in the last 168 hours CBC:  Recent Labs Lab 09/03/14 0335 09/04/14 0515 09/05/14 0400 09/06/14 0411 09/07/14 0400 09/08/14 0315 09/09/14 0642  WBC 34.8* 27.9* 14.3* 9.4 9.0 8.3 6.5  NEUTROABS 31.3* 23.4*  --   --   --   --   --   HGB 11.8* 10.0* 9.8* 9.3* 8.8* 9.3* 9.2*  HCT 32.5* 28.4* 28.5* 27.2* 25.7* 27.6* 27.6*  MCV 81.9 84.8 85.3 86.9 87.1 90.2 88.7  PLT 175 152 96* 99* 122* 189 231   Cardiac Enzymes: No results found for this basename: CKTOTAL, CKMB, CKMBINDEX, TROPONINI,  in the last 168 hours BNP (last 3 results)  Recent Labs  09/06/14 0411  PROBNP 43778.0*   CBG:  Recent Labs Lab 09/07/14 0341 09/07/14 0839 09/07/14 1130 09/07/14 1243 09/07/14 1552  GLUCAP 88 110* 181* 115* 150*    Recent Results (from the past 240 hour(s))  MRSA PCR SCREENING     Status: None   Collection Time    08/31/14  3:52 PM      Result Value Ref Range Status   MRSA by PCR NEGATIVE   NEGATIVE Final   Comment:            The GeneXpert MRSA Assay (FDA     approved for NASAL specimens     only), is one component of a     comprehensive MRSA colonization     surveillance program. It is not     intended to diagnose MRSA     infection nor to guide or     monitor treatment for     MRSA infections.  STOOL CULTURE     Status: None   Collection Time    08/31/14 10:00 PM      Result Value  Ref Range Status   Specimen Description STOOL   Final   Special Requests NONE   Final   Culture     Final   Value: NO SALMONELLA, SHIGELLA, CAMPYLOBACTER, YERSINIA, OR E.COLI 0157:H7 ISOLATED     Performed at [facility]   Report Status 09/04/2014 FINAL   Final  CLOSTRIDIUM DIFFICILE BY PCR     Status: None   Collection Time    09/01/14  2:37 AM      Result Value Ref Range Status   C difficile by pcr NEGATIVE  NEGATIVE Final  STOOL CULTURE     Status: None   Collection Time    09/04/14  3:43 PM      Result Value Ref Range Status   Specimen Description STOOL   Final   Special Requests NONE   Final   Culture     Final   Value: NO SALMONELLA, SHIGELLA, CAMPYLOBACTER, YERSINIA, OR E.COLI 0157:H7 ISOLATED     Note: REDUCED NORMAL FLORA PRESENT     Performed at [facility]   Report Status 09/08/2014 FINAL   Final     Studies: No results found.  Scheduled Meds: . antiseptic oral rinse  7 mL Mouth Rinse BID  . famotidine  20 mg Oral QHS  . heparin subcutaneous  5,000 Units Subcutaneous 3 times per day  . mesalamine  500 mg Oral TID  . methylPREDNISolone (SOLU-MEDROL) injection  40 mg Intravenous Q12H   Continuous Infusions: . sodium chloride Stopped (09/02/14 1152)     Charlynne Cousins  [facility] Pager [phone] If 8PM-8AM, please contact night-coverage at [email or web address], password [facility] 09/09/2014, 9:48 AM  LOS: 9 days

## 2014-09-10 DIAGNOSIS — K50019 Crohn's disease of small intestine with unspecified complications: Secondary | ICD-10-CM

## 2014-09-10 DIAGNOSIS — R001 Bradycardia, unspecified: Secondary | ICD-10-CM

## 2014-09-10 DIAGNOSIS — N179 Acute kidney failure, unspecified: Secondary | ICD-10-CM

## 2014-09-10 DIAGNOSIS — I4581 Long QT syndrome: Secondary | ICD-10-CM

## 2014-09-10 LAB — BASIC METABOLIC PANEL
ANION GAP: 11 (ref 5–15)
BUN: 21 mg/dL (ref 6–23)
CHLORIDE: 103 meq/L (ref 96–112)
CO2: 28 mEq/L (ref 19–32)
Calcium: 9 mg/dL (ref 8.4–10.5)
Creatinine, Ser: 1.81 mg/dL — ABNORMAL HIGH (ref 0.50–1.10)
GFR calc non Af Amer: 37 mL/min — ABNORMAL LOW (ref >90)
GFR, EST AFRICAN AMERICAN: 43 mL/min — AB (ref >90)
Glucose, Bld: 103 mg/dL — ABNORMAL HIGH (ref 70–99)
POTASSIUM: 3.6 meq/L — AB (ref 3.7–5.3)
Sodium: 142 mEq/L (ref 137–147)

## 2014-09-10 MED ORDER — PREDNISONE 20 MG PO TABS
40.0000 mg | ORAL_TABLET | Freq: Every day | ORAL | Status: DC
  Administered 2014-09-11 – 2014-09-14 (×4): 40 mg via ORAL
  Filled 2014-09-10 (×6): qty 2

## 2014-09-10 NOTE — Progress Notes (Signed)
Subjective: Patient denies diarrhea or abdominal pain.  Objective: Vital signs in last 24 hours: Temp:  [98 F (36.7 C)-98.3 F (36.8 C)] 98 F (36.7 C) (11/01 0440) Pulse Rate:  [48-56] 48 (11/01 0440) Resp:  [18] 18 (11/01 0440) BP: (113-130)/(53-67) 113/67 mmHg (11/01 0440) SpO2:  [94 %-100 %] 94 % (11/01 0440) Weight:  [77.8 kg (171 lb 8.3 oz)] 77.8 kg (171 lb 8.3 oz) (11/01 0440) Weight change: 0.643 kg (1 lb 6.7 oz) Last BM Date: 09/09/14  PE: GEN:  NAD, alert, answers some questions appropriately ABD:  Soft, non-tender  Lab Results: CBC    Component Value Date/Time   WBC 6.5 09/09/2014 0642   WBC 9.8 02/24/2013 1029   RBC 3.11* 09/09/2014 0642   RBC 4.24 02/24/2013 1029   HGB 9.2* 09/09/2014 0642   HGB 13.0 02/24/2013 1029   HCT 27.6* 09/09/2014 0642   HCT 40.3 02/24/2013 1029   PLT 231 09/09/2014 0642   PLT 240 02/24/2013 1029   MCV 88.7 09/09/2014 0642   MCV 95.0 02/24/2013 1029   MCH 29.6 09/09/2014 0642   MCH 30.7 02/24/2013 1029   MCHC 33.3 09/09/2014 0642   MCHC 32.3 02/24/2013 1029   RDW 15.0 09/09/2014 0642   RDW 12.8 02/24/2013 1029   LYMPHSABS 2.8 09/04/2014 0515   LYMPHSABS 2.9 02/24/2013 1029   MONOABS 1.7* 09/04/2014 0515   MONOABS 1.2* 02/24/2013 1029   EOSABS 0.0 09/04/2014 0515   EOSABS 0.2 02/24/2013 1029   BASOSABS 0.0 09/04/2014 0515   BASOSABS 0.0 02/24/2013 1029   CMP     Component Value Date/Time   NA 144 09/09/2014 0638   NA 140 02/24/2013 1029   K 4.4 09/09/2014 0638   K 3.4* 02/24/2013 1029   CL 104 09/09/2014 0638   CL 102 02/24/2013 1029   CO2 29 09/09/2014 0638   CO2 21* 02/24/2013 1029   GLUCOSE 87 09/09/2014 0638   GLUCOSE 81 02/24/2013 1029   BUN 23 09/09/2014 0638   BUN 6.9* 02/24/2013 1029   CREATININE 1.94* 09/09/2014 0638   CREATININE 0.8 02/24/2013 1029   CALCIUM 9.0 09/09/2014 0638   CALCIUM 9.9 02/24/2013 1029   PROT 5.7* 09/09/2014 0638   PROT 8.7* 02/24/2013 1029   ALBUMIN 2.0* 09/09/2014 0638   ALBUMIN 3.7 02/24/2013 1029   AST 21 09/09/2014 0638   AST 13 02/24/2013 1029   ALT 75* 09/09/2014 0638   ALT 9 02/24/2013 1029   ALKPHOS 64 09/09/2014 0638   ALKPHOS 57 02/24/2013 1029   BILITOT 0.3 09/09/2014 0638   BILITOT 0.39 02/24/2013 1029   GFRNONAA 34* 09/09/2014 0638   GFRAA 40* 09/09/2014 0638   Assessment:  1.  Cardiac arrest, resuscitated. 2.  Hypokalemia, resolved. 3.  Crohn's disease, seemingly active on recent CT scan. 4.  Diarrhea, seemingly resolved.  Plan:  1.  Diet advancement as tolerated. 2.  Continue Pentasa. 3.  Transition steroids from intravenous solumedrol to oral prednisone. 4.  Possible heart catheterization in the next few days, per review of cardiology's note. 5.  Will follow.   Landry Dyke 09/10/2014, 2:06 PM

## 2014-09-10 NOTE — Progress Notes (Signed)
Introduced self to pt.  Denies pain or discomfort.  Call bell at reach.  Instructed to call for help.  Verbalized understanding.  Will continue to monitor.  Karie Kirks, Therapist, sports.

## 2014-09-10 NOTE — Progress Notes (Signed)
[facility] PROGRESS NOTE Interim History: 28 year old female patient with known history of Crohn's disease, and pancytopenia from Crohn's medicine which has now resolved who apparently had been having a Crohn's exacerbation at home as manifested by profuse diarrhea for 3-4 days. On 10/22 patient collapsed at home. She was down at least 10-15 minutes before the fire department arrived and initiated CPR. 15 minutes later EMS arrived and initiated ACLS protocols with CPR for 15 more minutes prior to return of spontaneous circulation. She was defibrillated 4 times for V. fib .evaluated by Cardiology. She has undergone 2 echocardiograms which revealed systolic dysfunction with an EF of 20-25%.Plans are to pursue cardiac catheterization this week to ensure no congenital anomalous. neurological standpoint she has exam findings concerning for hypoxemic brain injury. She is alert but is having some issues with expressive aphasia. She also has mild dysphagia. Neurology is following. PT/OT recommending CIR   Filed Weights   09/08/14 0423 09/09/14 0637 09/10/14 0440  Weight: 76.522 kg (168 lb 11.2 oz) 77.157 kg (170 lb 1.6 oz) 77.8 kg (171 lb 8.3 oz)        Intake/Output Summary (Last 24 hours) at 09/10/14 5638 Last data filed at 09/09/14 1731  Gross per 24 hour  Intake    240 ml  Output    500 ml  Net   -260 ml     Assessment/Plan: Acute respiratory failure with hypoxia in the setting of Cardiac arrest (VF): - post arrest CM with EF 25% - plan is to r/o congenital coronary anomaly as contributing - cath next week once cr improved. - Sundown protocol Required mech ventilation initially now extubated 10.27.2015. - now stable on RA  - Initial CXR with bilateral lobe airspace disease, completed course of antibiotics in house.   Hypoxic ischemic encephalopathy (HIE): - SLP/PT/OT rec CIR - insurance approval in process  Bradycardia/Prolonged QT interval  - Per Cards - previous post arrest  BB off  - hemodynamically stable   Acute renal failure  - Baseline in Ct < 1.0. - was as high as 3.0, improving 1.9, cont IV fluid check a b-met in am.  Pancytopenia  History of this due to Crohn's med and was evaluated by Heme/had resolved   Crohn's duodenitis  - GI following - continue Solu-Medrol and Pentasa  - Not really clear for diarrhea is slowing down   Chronic Anemia secondary to chronic disease/Crohn's disease  Baseline hemoglobin 12.3 to 13      DVT prophylaxis: Subcutaneous heparin  Code Status: Full  Family Communication: Father at bedside  Disposition Plan/Expected LOS: Telemetry - CIR evaluation    Consultants:  Cardiology/Dr. Fransico Him  EP/Dr. Virl Axe  Gastroenterology/Dr. Wilford Corner  CIR/Dr. Mitzi Hansen [facility]  Antibiotics:  Zosyn 10/22 >10/29  Vancomycin 10/24 >10/25  Micafungin x 1     STUDIES:  10/16 Colonoscopy (Per [facility]) > active duodenitis with areas of villous blunting/atrophy in addition to a small granuloma with multinucleated giant cells as seen in one of the fragments. Given the history of Crohn's disease, the changes are most compatible with involvement by the same disease process  10/22 CT head > neg  10/22 CT cervical spine >neg  10/22 ECHO > RV normal, LVEF 20-25%  10/23 LE Doppler >neg  10/24 ct>Bowel wall thickening of terminal ileum and significant portions of particularly the distal colon compatible with enteritis, favor Crohn's disease in light of history.  SIGNIFICANT EVENTS:  10/22 Admit after several days diarrhea, cardiac arrest at home  with prolonged CPR  10/24- rewarmed, follows commands  10/26: weaned on PS all day  10/27: extubated  10/29 transfered to tele   HPI/Subjective: No complains  Objective: Filed Vitals:   09/09/14 1027 09/09/14 1532 09/09/14 2037 09/10/14 0440  BP: 107/48 113/53 130/63 113/67  Pulse: 53 51 56 48  Temp: 97.8 F (36.6 C) 98 F (36.7 C) 98.3 F (36.8 C) 98 F (36.7 C)    TempSrc: Oral Oral Oral Axillary  Resp:   18 18  Height:      Weight:    77.8 kg (171 lb 8.3 oz)  SpO2: 99% 100% 100% 94%     Exam:  General: Alert, awake, oriented x3. HEENT: No bruits, no goiter.  Heart: Regular rate and rhythm., without murmurs, rubs, gallops.  Lungs: Good air movement, clear Abdomen: Soft, nontender, nondistended, positive bowel sounds.    Data Reviewed: Basic Metabolic Panel:  Recent Labs Lab 09/05/14 0400 09/06/14 0411 09/07/14 0400 09/08/14 0315 09/09/14 0638  NA 138 144 146 142 144  K 3.9 3.5* 3.7 4.3 4.4  CL 98 98 97 103 104  CO2 31 34* 33* 29 29  GLUCOSE 83 71 87 96 87  BUN 24* 25* 24* 24* 23  CREATININE 2.83* 2.93* 2.54* 2.20* 1.94*  CALCIUM 7.9* 8.2* 8.2* 8.8 9.0  MG 2.1 2.0 1.9 2.5 2.2  PHOS 3.6 3.5 12.9* 2.9 4.5   Liver Function Tests:  Recent Labs Lab 09/04/14 0515 09/09/14 0638  AST 211* 21  ALT 97* 75*  ALKPHOS 66 64  BILITOT 0.5 0.3  PROT 4.2* 5.7*  ALBUMIN 1.5* 2.0*   No results for input(s): LIPASE, AMYLASE in the last 168 hours. No results for input(s): AMMONIA in the last 168 hours. CBC:  Recent Labs Lab 09/04/14 0515 09/05/14 0400 09/06/14 0411 09/07/14 0400 09/08/14 0315 09/09/14 0642  WBC 27.9* 14.3* 9.4 9.0 8.3 6.5  NEUTROABS 23.4*  --   --   --   --   --   HGB 10.0* 9.8* 9.3* 8.8* 9.3* 9.2*  HCT 28.4* 28.5* 27.2* 25.7* 27.6* 27.6*  MCV 84.8 85.3 86.9 87.1 90.2 88.7  PLT 152 96* 99* 122* 189 231   Cardiac Enzymes: No results for input(s): CKTOTAL, CKMB, CKMBINDEX, TROPONINI in the last 168 hours. BNP (last 3 results)  Recent Labs  09/06/14 0411  PROBNP 43778.0*   CBG:  Recent Labs Lab 09/07/14 0341 09/07/14 0839 09/07/14 1130 09/07/14 1243 09/07/14 1552  GLUCAP 88 110* 181* 115* 150*    Recent Results (from the past 240 hour(s))  MRSA PCR Screening     Status: None   Collection Time: 08/31/14  3:52 PM  Result Value Ref Range Status   MRSA by PCR NEGATIVE NEGATIVE Final     Comment:        The GeneXpert MRSA Assay (FDA approved for NASAL specimens only), is one component of a comprehensive MRSA colonization surveillance program. It is not intended to diagnose MRSA infection nor to guide or monitor treatment for MRSA infections.  Stool culture     Status: None   Collection Time: 08/31/14 10:00 PM  Result Value Ref Range Status   Specimen Description STOOL  Final   Special Requests NONE  Final   Culture   Final    NO SALMONELLA, SHIGELLA, CAMPYLOBACTER, YERSINIA, OR E.COLI 0157:H7 ISOLATED Performed at [facility]   Report Status 09/04/2014 FINAL  Final  Clostridium Difficile by PCR     Status: None  Collection Time: 09/01/14  2:37 AM  Result Value Ref Range Status   C difficile by pcr NEGATIVE NEGATIVE Final  Stool culture     Status: None   Collection Time: 09/04/14  3:43 PM  Result Value Ref Range Status   Specimen Description STOOL  Final   Special Requests NONE  Final   Culture   Final    NO SALMONELLA, SHIGELLA, CAMPYLOBACTER, YERSINIA, OR E.COLI 0157:H7 ISOLATED Note: REDUCED NORMAL FLORA PRESENT Performed at [facility]   Report Status 09/08/2014 FINAL  Final     Studies: No results found.  Scheduled Meds: . acidophilus  2 capsule Oral Daily  . antiseptic oral rinse  7 mL Mouth Rinse BID  . famotidine  20 mg Oral QHS  . heparin subcutaneous  5,000 Units Subcutaneous 3 times per day  . mesalamine  500 mg Oral TID  . methylPREDNISolone (SOLU-MEDROL) injection  40 mg Intravenous Q12H   Continuous Infusions: . sodium chloride Stopped (09/02/14 1152)     Charlynne Cousins  [facility] Pager [phone] If 8PM-8AM, please contact night-coverage at [email or web address], password [facility] 09/10/2014, 9:53 AM  LOS: 10 days

## 2014-09-10 NOTE — Progress Notes (Signed)
No real change in clinical status. Renal function has not been performed today. We'll check a basic metabolic panel today and in a.m. Tomorrow we will make a decision about when cath should be performed.

## 2014-09-11 ENCOUNTER — Encounter (HOSPITAL_COMMUNITY)
Admission: EM | Disposition: A | Discharge status: Home Health | Payer: Self-pay | Source: Home / Self Care | Attending: Pulmonary Disease

## 2014-09-11 DIAGNOSIS — I429 Cardiomyopathy, unspecified: Secondary | ICD-10-CM

## 2014-09-11 DIAGNOSIS — N179 Acute kidney failure, unspecified: Secondary | ICD-10-CM

## 2014-09-11 DIAGNOSIS — I4901 Ventricular fibrillation: Secondary | ICD-10-CM

## 2014-09-11 DIAGNOSIS — I495 Sick sinus syndrome: Secondary | ICD-10-CM

## 2014-09-11 DIAGNOSIS — R001 Bradycardia, unspecified: Secondary | ICD-10-CM

## 2014-09-11 HISTORY — PX: ELECTROPHYSIOLOGY STUDY: SHX5467

## 2014-09-11 LAB — BASIC METABOLIC PANEL
Anion gap: 9 (ref 5–15)
BUN: 17 mg/dL (ref 6–23)
CHLORIDE: 106 meq/L (ref 96–112)
CO2: 27 mEq/L (ref 19–32)
CREATININE: 1.67 mg/dL — AB (ref 0.50–1.10)
Calcium: 8.6 mg/dL (ref 8.4–10.5)
GFR calc Af Amer: 48 mL/min — ABNORMAL LOW (ref >90)
GFR, EST NON AFRICAN AMERICAN: 41 mL/min — AB (ref >90)
Glucose, Bld: 77 mg/dL (ref 70–99)
POTASSIUM: 3.5 meq/L — AB (ref 3.7–5.3)
Sodium: 142 mEq/L (ref 137–147)

## 2014-09-11 LAB — NA AND K (SODIUM & POTASSIUM), RAND UR
POTASSIUM UR: 13 meq/L
SODIUM UR: 153 meq/L

## 2014-09-11 LAB — TSH: TSH: 4.26 u[IU]/mL (ref 0.350–4.500)

## 2014-09-11 SURGERY — ELECTROPHYSIOLOGY STUDY
Anesthesia: LOCAL

## 2014-09-11 MED ORDER — EPINEPHRINE HCL 1 MG/ML IJ SOLN
INTRAMUSCULAR | Status: AC
  Filled 2014-09-11: qty 1

## 2014-09-11 MED ORDER — POTASSIUM CHLORIDE CRYS ER 20 MEQ PO TBCR
40.0000 meq | EXTENDED_RELEASE_TABLET | Freq: Two times a day (BID) | ORAL | Status: AC
  Administered 2014-09-11 (×2): 40 meq via ORAL
  Filled 2014-09-11 (×2): qty 2

## 2014-09-11 MED ORDER — FLECAINIDE ACETATE 100 MG PO TABS
300.0000 mg | ORAL_TABLET | Freq: Once | ORAL | Status: AC
  Administered 2014-09-11: 300 mg via ORAL
  Filled 2014-09-11: qty 3

## 2014-09-11 NOTE — Plan of Care (Signed)
Problem: Acute Rehab OT Goals (only OT should resolve) Goal: Pt. Will Perform Eating Outcome: Completed/Met Date Met:  09/11/14

## 2014-09-11 NOTE — Progress Notes (Signed)
Physical Therapy Treatment Patient Details Name: Julie Saunders MRN: [id number] DOB: February 08, 1986 Today's Date: 09/11/2014    History of Present Illness Pt adm after cardiac arrest at home likely due to low K due to diarrhea. Pt with prolonged down time. Hypothermia protocol. Pt extubated 10/27. Pt with history of Crohns.    PT Comments    Pt making excellent progress with mobility and cognition. Feel she continues to need CIR at dc in order to address her continued physical and cognitive deficits.  Follow Up Recommendations  CIR     Equipment Recommendations  Rolling walker with 5" wheels    Recommendations for Other Services       Precautions / Restrictions Precautions Precautions: Fall    Mobility  Bed Mobility                  Transfers Overall transfer level: Needs assistance Equipment used: Rolling walker (2 wheeled);1 person hand held assist Transfers: Sit to/from Stand Sit to Stand: Min assist         General transfer comment: Verbal cues for hand placment. Assist to bring hips up from chair.  Ambulation/Gait Ambulation/Gait assistance: Min assist;+2 safety/equipment Ambulation Distance (Feet): 140 Feet Assistive device: Rolling walker (2 wheeled);1 person hand held assist Gait Pattern/deviations: Step-to pattern;Step-through pattern;Decreased step length - right;Decreased step length - left;Narrow base of support;Scissoring Gait velocity: decr Gait velocity interpretation: Below normal speed for age/gender General Gait Details: Initially pt using step to gait and stepping past front of walker. With cues pt not stepping past front of walker. Cued pt to walk with step through gait and assisted by pushing walker further ahead of her. Pt able to perform step through but occasionally would narrow base and step over midline with one foot. Incr lateral trunk sway. Amb a second time with hand held assist and pt with wider base of support but shorter step  length.   Stairs            Wheelchair Mobility    Modified Rankin (Stroke Patients Only)       Balance   Sitting-balance support: No upper extremity supported;Feet supported Sitting balance-Leahy Scale: Good     Standing balance support: Single extremity supported Standing balance-Leahy Scale: Poor Standing balance comment: static standing with hand held                    Cognition Arousal/Alertness: Awake/alert Behavior During Therapy: Impulsive Overall Cognitive Status: Impaired/Different from baseline Area of Impairment: Memory     Memory: Decreased short-term memory Following Commands: Follows multi-step commands consistently            Exercises      General Comments        Pertinent Vitals/Pain Pain Assessment: No/denies pain    Home Living                      Prior Function            PT Goals (current goals can now be found in the care plan section) Acute Rehab PT Goals Patient Stated Goal: Pt wants to go home. PT Goal Formulation: With patient Time For Goal Achievement: 09/18/14 Potential to Achieve Goals: Good Progress towards PT goals: Goals met and updated - see care plan    Frequency  Min 3X/week    PT Plan Current plan remains appropriate    Co-evaluation             End  of Session Equipment Utilized During Treatment: Gait belt Activity Tolerance: Patient tolerated treatment well Patient left: in chair;with call bell/phone within reach;with family/visitor present;with chair alarm set     Time: 1110-1143 PT Time Calculation (min): 33 min  Charges:  $Gait Training: 23-37 mins                    G Codes:      Aleese Kamps 10/01/2014, 1:47 PM  [employer] PT [phone]

## 2014-09-11 NOTE — Progress Notes (Signed)
[facility] Progress Note  Subjective: Minimal diarrhea, abdominal pain, tolerating solid diet  Objective: Vital signs in last 24 hours: Temp:  [98.3 F (36.8 C)-98.5 F (36.9 C)] 98.5 F (36.9 C) (11/02 0506) Pulse Rate:  [46-53] 46 (11/02 0506) Resp:  [18] 18 (11/02 0506) BP: (114-125)/(55-59) 125/56 mmHg (11/02 0506) SpO2:  [98 %-100 %] 98 % (11/02 0506) Weight:  [75.161 kg (165 lb 11.2 oz)] 75.161 kg (165 lb 11.2 oz) (11/02 0506) Weight change: -2.639 kg (-5 lb 13.1 oz)   PE:abd soft  Lab Results: Results for orders placed or performed during the hospital encounter of 08/31/14 (from the past 24 hour(s))  Basic metabolic panel     Status: Abnormal   Collection Time: 09/10/14  1:10 PM  Result Value Ref Range   Sodium 142 137 - 147 mEq/L   Potassium 3.6 (L) 3.7 - 5.3 mEq/L   Chloride 103 96 - 112 mEq/L   CO2 28 19 - 32 mEq/L   Glucose, Bld 103 (H) 70 - 99 mg/dL   BUN 21 6 - 23 mg/dL   Creatinine, Ser 1.81 (H) 0.50 - 1.10 mg/dL   Calcium 9.0 8.4 - 10.5 mg/dL   GFR calc non Af Amer 37 (L) >90 mL/min   GFR calc Af Amer 43 (L) >90 mL/min   Anion gap 11 5 - 15  Basic metabolic panel     Status: Abnormal   Collection Time: 09/11/14  4:50 AM  Result Value Ref Range   Sodium 142 137 - 147 mEq/L   Potassium 3.5 (L) 3.7 - 5.3 mEq/L   Chloride 106 96 - 112 mEq/L   CO2 27 19 - 32 mEq/L   Glucose, Bld 77 70 - 99 mg/dL   BUN 17 6 - 23 mg/dL   Creatinine, Ser 1.67 (H) 0.50 - 1.10 mg/dL   Calcium 8.6 8.4 - 10.5 mg/dL   GFR calc non Af Amer 41 (L) >90 mL/min   GFR calc Af Amer 48 (L) >90 mL/min   Anion gap 9 5 - 15    Studies/Results: No results found.    Assessment: 1. Crohn's Disease 2. SP v-fib/cardiac arrest 3. Hypokalemia improved   Plan: 1. Switched from solumedrol to prednisone 2. Cont pentasa 3. Will follow QOD     Karoline Fleer C 09/11/2014, 9:07 AM

## 2014-09-11 NOTE — Progress Notes (Signed)
Occupational Therapy Treatment Patient Details Name: Julie Saunders MRN: [id number] DOB: [birth year] Today's Date: 09/11/2014    History of present illness Pt adm after cardiac arrest at home likely due to low K due to diarrhea. Pt with prolonged down time. Hypothermia protocol. Pt extubated 10/27. Pt with history of Crohns.   OT comments  Pt is making remarkable gains in mobility, coordination, balance, and cognition.  She can self feed, groom and donn her socks today.  She is oriented to date without use of environmental cues and engaging in appropriate conversation. She continues to demonstrate memory deficits and impulsivity.  Pt continues to be an appropriate rehab candidate.  Follow Up Recommendations  CIR    Equipment Recommendations       Recommendations for Other Services      Precautions / Restrictions Precautions Precautions: Fall       Mobility Bed Mobility Overal bed mobility: Needs Assistance Bed Mobility: Supine to Sit     Supine to sit: Supervision;HOB elevated     General bed mobility comments: No physical assist required.  Transfers Overall transfer level: Needs assistance   Transfers: Stand Pivot Transfers;Sit to/from Stand Sit to Stand: Min guard Stand pivot transfers: Min assist       General transfer comment: verbal cues to stand momentarily and gain her balance prior to transferring into chair.    Balance                                   ADL Overall ADL's : Needs assistance/impaired Eating/Feeding: Independent;Sitting Eating/Feeding Details (indicate cue type and reason): including opening containers Grooming: Wash/dry face;Oral care;Sitting;Set up Grooming Details (indicate cue type and reason): used her L hand             Lower Body Dressing: Set up;Bed level Lower Body Dressing Details (indicate cue type and reason): donned socks in bed Toilet Transfer: Minimal assistance;Stand-pivot Toilet Transfer Details  (indicate cue type and reason): bed to chair           General ADL Comments: Pt much more alert and conversive.  Has little memory of hospital course.      Vision                     Perception     Praxis      Cognition   Behavior During Therapy: Impulsive Overall Cognitive Status: Impaired/Different from baseline Area of Impairment: Memory     Memory: Decreased short-term memory          General Comments: Pt able to state date without use of calendar. Only able to repeat parts of conversation with cardiology PA to relay to her father.    Extremity/Trunk Assessment               Exercises     Shoulder Instructions       General Comments      Pertinent Vitals/ Pain       Pain Assessment: No/denies pain  Home Living                                          Prior Functioning/Environment              Frequency Min 2X/week     Progress Toward Goals  OT Goals(current goals  can now be found in the care plan section)  Progress towards OT goals: Progressing toward goals  Acute Rehab OT Goals Patient Stated Goal: Pt wants to go home.  Plan Discharge plan remains appropriate    Co-evaluation                 End of Session Equipment Utilized During Treatment: Gait belt   Activity Tolerance Patient tolerated treatment well   Patient Left in chair;with call bell/phone within reach;with chair alarm set;with nursing/sitter in room (IV team nurse)   Nurse Communication          Time: 5956-3875 OT Time Calculation (min): 38 min  Charges: OT General Charges $OT Visit: 1 Procedure OT Treatments $Self Care/Home Management : 38-52 mins  Malka So 09/11/2014, 10:09 AM  [phone]

## 2014-09-11 NOTE — Progress Notes (Signed)
Flecainide tablet 300 mg given at 1903 with QTc 476 and every 15 mins afterwards with QTc ranging from 478-481, then will check EKG in AM. Will continue to monitor pt

## 2014-09-11 NOTE — CV Procedure (Signed)
[name] [id number]  [id number]  Preop XB:[id number] arrest Postop Dx same/   Procedure:epinephrine infusion observation  Cx: None   EBL: Minimal    Pt received graduated epinephrine infusions at 0.05, 0.1 and0.2 mcg/kg min QTc intervals were stable at 465mec Rare ectopics Will give flecainide overnight to assess for brugada    SVirl Axe MD 09/11/2014 5:24 PM

## 2014-09-11 NOTE — H&P (View-Only) (Signed)
Patient: Julie Saunders / Admit Date: 08/31/2014 / Date of Encounter: 09/11/2014, 9:09 AM   Subjective: No complaints.    Objective: Telemetry: NSR, brief ventricular bigeminy, paroxysmal sinus bradycardia - (11/1 early AM's occasionally dropped into the mid-upper 30s, 11/1 afternoon HR occasionally 40's with rare dropping into upper 30's, this AM - brief ventricular bigeminy and brief HR mid-40s) Physical Exam: Blood pressure 125/56, pulse 46, temperature 98.5 F (36.9 C), temperature source Oral, resp. rate 18, height 5' 5.5" (1.664 m), weight 165 lb 11.2 oz (75.161 kg), SpO2 98 %. General: Well developed, well nourished [name] in no acute distress. Head: Normocephalic, atraumatic, sclera non-icteric, no xanthomas, nares are without discharge. Neck: JVP not elevated. Lungs: Clear bilaterally to auscultation without wheezes, rales, or rhonchi. Breathing is unlabored. Heart: RRR S1 S2 without murmurs, rubs, or gallops.  Abdomen: Soft, non-tender, non-distended with normoactive bowel sounds. No rebound/guarding. Extremities: No clubbing or cyanosis. No edema. Distal pedal pulses are 2+ and equal bilaterally. Neuro: Alert and oriented X 3. Moves all extremities spontaneously. Psych:  Responds to questions appropriately with a somewhat simpler affect.   Intake/Output Summary (Last 24 hours) at 09/11/14 0909 Last data filed at 09/10/14 1530  Gross per 24 hour  Intake    240 ml  Output      0 ml  Net    240 ml    Inpatient Medications:  . acidophilus  2 capsule Oral Daily  . antiseptic oral rinse  7 mL Mouth Rinse BID  . famotidine  20 mg Oral QHS  . heparin subcutaneous  5,000 Units Subcutaneous 3 times per day  . mesalamine  500 mg Oral TID  . potassium chloride  40 mEq Oral BID  . predniSONE  40 mg Oral Q breakfast   Infusions:  . sodium chloride 100 mL/hr at 09/10/14 1951    Labs:  Recent Labs  09/09/14 0638 09/10/14 1310 09/11/14 0450  NA 144 142 142  K 4.4 3.6*  3.5*  CL 104 103 106  CO2 29 28 27   GLUCOSE 87 103* 77  BUN 23 21 17   CREATININE 1.94* 1.81* 1.67*  CALCIUM 9.0 9.0 8.6  MG 2.2  --   --   PHOS 4.5  --   --     Recent Labs  09/09/14 0638  AST 21  ALT 75*  ALKPHOS 64  BILITOT 0.3  PROT 5.7*  ALBUMIN 2.0*    Recent Labs  09/09/14 0642  WBC 6.5  HGB 9.2*  HCT 27.6*  MCV 88.7  PLT 231   Radiology/Studies:  CXR 09/05/14 IMPRESSION: Tube and catheter positions as described without pneumothorax. Persistent left lower lobe consolidation. No new opacity. No change in cardiac silhouette.   Assessment and Plan  1. 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 She has h/o recurrent hypokalemia (see below) so the former seems more likely. She had transient cardiomyopathy with initial EF 30-35%, improved to 60% suggesting that her low EF was related to the arrest itself rather than preceding it. However, Dr. Caryl Comes feels with the uncertainty surround the event, this may prompt the need for protection against the risk of recurrent ventricular arrhythmias. Further compounding her clinical situation is the paroxymsal sinus bradycardia seen on telemetry raising question of underlying EP abnormality such as Brugada. Dr. Caryl Comes recommends EP study today with flecainide and epi infusions to guide further management. She has been added  onto the schedule and will make NPO at 1000 - no lunch. Her anoxic brain injury seems to be improving. Chanetta Marshall RN notified me that the patient is currently on the schedule for subcu ICD on Wednesday but we may need to adjust timing if cath gets pushed further back. Her Cr is not yet back to baseline so we are holding off on cath today. 2. Paroxysmal sinus bradycardia post-arrest - unclear etiology, particularly given recovery from arrest. Initial TSH was abnormal. Will repeat with fT4. 3. Recurrent hypokalemia - traditionally this  has been felt due to poor oral intake/diarrhea. However, K was 3.0 in 11/2012 and 3.4 in 02/2013 in the absence of GI symptoms. She is not hypomagnesemic but received multiple repletions earlier in her stay so will recheck tomorrow. Will order 24 hour urine potassium measurement along with renin/aldosterone assessment to determine if there are any obvious secondary causes. Consider repeat blood gas to assess acid/base status. After her 24 hr urine assessment is complete, will likely plan to start spironolactone for potassium sparing. IM has ordered KCl for today. 4. Transient cardiomyopathy with cardiogenic shock/initial EF 30-35%, improved to 60% - this suggests that her low EF was related to the arrest itself rather than preceding. No further need for pressors. 5. Acute kidney injury in the setting of #1 - improving.  6. Crohn's disease - per IM. 7. Acute anemia with h/o pancytopenia - per IM.  Signed, Melina Copa PA-C Have reviewed implications of sinus bradycardia and couplets, monomorphic, late coupled with a couplet, and the observation that previously hypokalemia was not associated with diarrhea Will undertake epi infusion with some trepidation and procainamide if neg for possible channelopathy diagnosis  She may also need GXT to look for CPVT  Her mother died suddenly 07/21/2023 without known heart disease and without warning

## 2014-09-11 NOTE — Progress Notes (Signed)
Rehab admissions - I am following pt's case and met with pt and her father. Further questions were answered about our inpatient rehab program and I noted that pt is planned for electrophysiology study with medication infusion later today.   Per chart, pt is also to have cardiac cath performed (procedure will likely be tomorrow per Butch Penny, Education officer, museum).   We have now opened pt's case with [facility] to seek insurance authorization for inpatient rehab.   We will consider possible rehab admission pending her completed medical work up and medical clearance, insurance authorization and our bed availability.  I will check on pt's status tomorrow and proceed from there. Please call me with any questions. Thanks.  Nanetta Batty, PT Rehabilitation Admissions Coordinator [phone]

## 2014-09-11 NOTE — Progress Notes (Signed)
[facility] PROGRESS NOTE Interim History: 28 year old female patient with known history of Crohn's disease, and pancytopenia from Crohn's medicine which has now resolved who apparently had been having a Crohn's exacerbation at home as manifested by profuse diarrhea for 3-4 days. On 10/22 patient collapsed at home. She was down at least 10-15 minutes before the fire department arrived and initiated CPR. 15 minutes later EMS arrived and initiated ACLS protocols with CPR for 15 more minutes prior to return of spontaneous circulation. She was defibrillated 4 times for V. fib .evaluated by Cardiology. She has undergone 2 echocardiograms which revealed systolic dysfunction with an EF of 20-25%.Plans are to pursue cardiac catheterization this week to ensure no congenital anomalous. neurological standpoint she has exam findings concerning for hypoxemic brain injury. She is alert but is having some issues with expressive aphasia. She also has mild dysphagia. Neurology is following. 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 - consulted cards rec EP evaluation Dr. Caryl Comes recommends EP study today with flecainide and epi infusions to guide further management.   Hypoxic ischemic encephalopathy (HIE): - SLP/PT/OT rec CIR - insurance approval in  process  Bradycardia/Prolonged QT interval  - Per Cards - previous post arrest BB off  - hemodynamically stable   Acute renal failure  - Baseline in Ct < 1.0. - was as high as 3.0, improving 1.6  Pancytopenia  - History of this due to Crohn's med and was evaluated by Heme/had resolved   Crohn's duodenitis  - GI following - change steroids to orals and Pentasa   Chronic Anemia secondary to chronic disease/Crohn's disease  Baseline hemoglobin 12.3 to 13      DVT prophylaxis: Subcutaneous heparin  Code Status: Full  Family Communication: Father at bedside  Disposition Plan/Expected LOS: Telemetry - CIR evaluation    Consultants:  Cardiology/Dr. Fransico Him  EP/Dr. Virl Axe  Gastroenterology/Dr. Wilford Corner  CIR/Dr. Mitzi Hansen [facility]  Antibiotics:  Zosyn 10/22 >10/29  Vancomycin 10/24 >10/25  Micafungin x 1     STUDIES:  10/16 Colonoscopy (Per [facility]) > active duodenitis with areas of villous blunting/atrophy in addition to a small granuloma with multinucleated giant cells as seen in one of the fragments. Given the history of Crohn's disease, the changes are most compatible with involvement by the same disease process  10/22 CT head > neg  10/22 CT cervical spine >neg  10/22 ECHO > RV normal, LVEF 20-25%  10/23 LE Doppler >neg  10/24 ct>Bowel wall thickening of terminal ileum and significant portions of particularly the distal colon compatible with enteritis, favor Crohn's disease in light of history.  SIGNIFICANT EVENTS:  10/22 Admit after several days diarrhea, cardiac arrest  at home with prolonged CPR  10/24- rewarmed, follows commands  10/26: weaned on PS all day  10/27: extubated  10/29 transfered to tele   HPI/Subjective: No complains  Objective: Filed Vitals:   09/10/14 0440 09/10/14 1551 09/10/14 2001 09/11/14 0506  BP: 113/67 121/59 114/55 125/56  Pulse: 48 53 52 46  Temp: 98 F (36.7 C) 98.3 F (36.8 C) 98.3 F (36.8 C) 98.5 F (36.9  C)  TempSrc: Axillary Oral Oral Oral  Resp: 18  18 18   Height:      Weight: 77.8 kg (171 lb 8.3 oz)   75.161 kg (165 lb 11.2 oz)  SpO2: 94% 100% 100% 98%     Exam:  General: Alert, awake, oriented x3. HEENT: No bruits, no goiter.  Heart: Regular rate and rhythm., without murmurs, rubs, gallops.  Lungs: Good air movement, clear Abdomen: Soft, nontender, nondistended, positive bowel sounds.    Data Reviewed: Basic Metabolic Panel:  Recent Labs Lab 09/05/14 0400 09/06/14 0411 09/07/14 0400 09/08/14 0315 09/09/14 0638 09/10/14 1310 09/11/14 0450  NA 138 144 146 142 144 142 142  K 3.9 3.5* 3.7 4.3 4.4 3.6* 3.5*  CL 98 98 97 103 104 103 106  CO2 31 34* 33* 29 29 28 27   GLUCOSE 83 71 87 96 87 103* 77  BUN 24* 25* 24* 24* 23 21 17   CREATININE 2.83* 2.93* 2.54* 2.20* 1.94* 1.81* 1.67*  CALCIUM 7.9* 8.2* 8.2* 8.8 9.0 9.0 8.6  MG 2.1 2.0 1.9 2.5 2.2  --   --   PHOS 3.6 3.5 12.9* 2.9 4.5  --   --    Liver Function Tests:  Recent Labs Lab 09/09/14 0638  AST 21  ALT 75*  ALKPHOS 64  BILITOT 0.3  PROT 5.7*  ALBUMIN 2.0*   No results for input(s): LIPASE, AMYLASE in the last 168 hours. No results for input(s): AMMONIA in the last 168 hours. CBC:  Recent Labs Lab 09/05/14 0400 09/06/14 0411 09/07/14 0400 09/08/14 0315 09/09/14 0642  WBC 14.3* 9.4 9.0 8.3 6.5  HGB 9.8* 9.3* 8.8* 9.3* 9.2*  HCT 28.5* 27.2* 25.7* 27.6* 27.6*  MCV 85.3 86.9 87.1 90.2 88.7  PLT 96* 99* 122* 189 231   Cardiac Enzymes: No results for input(s): CKTOTAL, CKMB, CKMBINDEX, TROPONINI in the last 168 hours. BNP (last 3 results)  Recent Labs  09/06/14 0411  PROBNP 43778.0*   CBG:  Recent Labs Lab 09/07/14 0341 09/07/14 0839 09/07/14 1130 09/07/14 1243 09/07/14 1552  GLUCAP 88 110* 181* 115* 150*    Recent Results (from the past 240 hour(s))  Stool culture     Status: None   Collection Time: 09/04/14  3:43 PM  Result Value Ref Range Status   Specimen Description STOOL   Final   Special Requests NONE  Final   Culture   Final    NO SALMONELLA, SHIGELLA, CAMPYLOBACTER, YERSINIA, OR E.COLI 0157:H7 ISOLATED Note: REDUCED NORMAL FLORA PRESENT Performed at [facility]   Report Status 09/08/2014 FINAL  Final     Studies: No results found.  Scheduled Meds: . acidophilus  2 capsule Oral Daily  . antiseptic oral rinse  7 mL Mouth Rinse BID  . famotidine  20 mg Oral QHS  . heparin subcutaneous  5,000 Units Subcutaneous 3 times per day  . mesalamine  500 mg Oral TID  . potassium chloride  40 mEq Oral BID  . predniSONE  40 mg Oral Q breakfast   Continuous Infusions: . sodium chloride  100 mL/hr at 09/10/14 [place], ABRAHAM  [facility] Pager [phone] If 8PM-8AM, please contact night-coverage at [email or web address], password [facility] 09/11/2014, 10:20 AM  LOS: 11 days

## 2014-09-11 NOTE — Interval H&P Note (Signed)
History and Physical Interval Note:  09/11/2014 4:38 PM  Julie Saunders  has presented today for surgery, with the diagnosis of snycope  The various methods of treatment have been discussed with the patient and family. After consideration of risks, benefits and other options for treatment, the patient has consented to  Procedure(s): ELECTROPHYSIOLOGY STUDY (N/A) as a surgical intervention .  The patient's history has been reviewed, patient examined, no change in status, stable for surgery.  I have reviewed the patient's chart and labs.  Questions were answered to the patient's satisfaction.     Virl Axe

## 2014-09-11 NOTE — Plan of Care (Signed)
Problem: Acute Rehab PT Goals(only PT should resolve) Goal: Pt will Roll Supine to Side Outcome: Completed/Met Date Met:  09/11/14 Goal: Pt Will Go Supine/Side To Sit Outcome: Completed/Met Date Met:  09/11/14

## 2014-09-11 NOTE — Progress Notes (Signed)
Patient: Julie Saunders / Admit Date: 08/31/2014 / Date of Encounter: 09/11/2014, 9:09 AM   Subjective: No complaints.    Objective: Telemetry: NSR, brief ventricular bigeminy, paroxysmal sinus bradycardia - (11/1 early AM's occasionally dropped into the mid-upper 30s, 11/1 afternoon HR occasionally 40's with rare dropping into upper 30's, this AM - brief ventricular bigeminy and brief HR mid-40s) Physical Exam: Blood pressure 125/56, pulse 46, temperature 98.5 F (36.9 C), temperature source Oral, resp. rate 18, height 5' 5.5" (1.664 m), weight 165 lb 11.2 oz (75.161 kg), SpO2 98 %. General: Well developed, well nourished [name] in no acute distress. Head: Normocephalic, atraumatic, sclera non-icteric, no xanthomas, nares are without discharge. Neck: JVP not elevated. Lungs: Clear bilaterally to auscultation without wheezes, rales, or rhonchi. Breathing is unlabored. Heart: RRR S1 S2 without murmurs, rubs, or gallops.  Abdomen: Soft, non-tender, non-distended with normoactive bowel sounds. No rebound/guarding. Extremities: No clubbing or cyanosis. No edema. Distal pedal pulses are 2+ and equal bilaterally. Neuro: Alert and oriented X 3. Moves all extremities spontaneously. Psych:  Responds to questions appropriately with a somewhat simpler affect.   Intake/Output Summary (Last 24 hours) at 09/11/14 0909 Last data filed at 09/10/14 1530  Gross per 24 hour  Intake    240 ml  Output      0 ml  Net    240 ml    Inpatient Medications:  . acidophilus  2 capsule Oral Daily  . antiseptic oral rinse  7 mL Mouth Rinse BID  . famotidine  20 mg Oral QHS  . heparin subcutaneous  5,000 Units Subcutaneous 3 times per day  . mesalamine  500 mg Oral TID  . potassium chloride  40 mEq Oral BID  . predniSONE  40 mg Oral Q breakfast   Infusions:  . sodium chloride 100 mL/hr at 09/10/14 1951    Labs:  Recent Labs  09/09/14 0638 09/10/14 1310 09/11/14 0450  NA 144 142 142  K 4.4 3.6*  3.5*  CL 104 103 106  CO2 29 28 27   GLUCOSE 87 103* 77  BUN 23 21 17   CREATININE 1.94* 1.81* 1.67*  CALCIUM 9.0 9.0 8.6  MG 2.2  --   --   PHOS 4.5  --   --     Recent Labs  09/09/14 0638  AST 21  ALT 75*  ALKPHOS 64  BILITOT 0.3  PROT 5.7*  ALBUMIN 2.0*    Recent Labs  09/09/14 0642  WBC 6.5  HGB 9.2*  HCT 27.6*  MCV 88.7  PLT 231   Radiology/Studies:  CXR 09/05/14 IMPRESSION: Tube and catheter positions as described without pneumothorax. Persistent left lower lobe consolidation. No new opacity. No change in cardiac silhouette.   Assessment and Plan  1. 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 She has h/o recurrent hypokalemia (see below) so the former seems more likely. She had transient cardiomyopathy with initial EF 30-35%, improved to 60% suggesting that her low EF was related to the arrest itself rather than preceding it. However, Dr. Caryl Comes feels with the uncertainty surround the event, this may prompt the need for protection against the risk of recurrent ventricular arrhythmias. Further compounding her clinical situation is the paroxymsal sinus bradycardia seen on telemetry raising question of underlying EP abnormality such as Brugada. Dr. Caryl Comes recommends EP study today with flecainide and epi infusions to guide further management. She has been added  onto the schedule and will make NPO at 1000 - no lunch. Her anoxic brain injury seems to be improving. Julie Marshall RN notified me that the patient is currently on the schedule for subcu ICD on Wednesday but we may need to adjust timing if cath gets pushed further back. Her Cr is not yet back to baseline so we are holding off on cath today. 2. Paroxysmal sinus bradycardia post-arrest - unclear etiology, particularly given recovery from arrest. Initial TSH was abnormal. Will repeat with fT4. 3. Recurrent hypokalemia - traditionally this  has been felt due to poor oral intake/diarrhea. However, K was 3.0 in 11/2012 and 3.4 in 02/2013 in the absence of GI symptoms. She is not hypomagnesemic but received multiple repletions earlier in her stay so will recheck tomorrow. Will order 24 hour urine potassium measurement along with renin/aldosterone assessment to determine if there are any obvious secondary causes. Consider repeat blood gas to assess acid/base status. After her 24 hr urine assessment is complete, will likely plan to start spironolactone for potassium sparing. IM has ordered KCl for today. 4. Transient cardiomyopathy with cardiogenic shock/initial EF 30-35%, improved to 60% - this suggests that her low EF was related to the arrest itself rather than preceding. No further need for pressors. 5. Acute kidney injury in the setting of #1 - improving.  6. Crohn's disease - per IM. 7. Acute anemia with h/o pancytopenia - per IM.  Signed, Melina Copa PA-C Have reviewed implications of sinus bradycardia and couplets, monomorphic, late coupled with a couplet, and the observation that previously hypokalemia was not associated with diarrhea Will undertake epi infusion with some trepidation and procainamide if neg for possible channelopathy diagnosis  She may also need GXT to look for CPVT  Her mother died suddenly 14-Jul-2023 without known heart disease and without warning

## 2014-09-12 ENCOUNTER — Inpatient Hospital Stay (HOSPITAL_COMMUNITY): Payer: 59

## 2014-09-12 DIAGNOSIS — I469 Cardiac arrest, cause unspecified: Secondary | ICD-10-CM

## 2014-09-12 LAB — BASIC METABOLIC PANEL
ANION GAP: 9 (ref 5–15)
BUN: 14 mg/dL (ref 6–23)
CHLORIDE: 110 meq/L (ref 96–112)
CO2: 25 mEq/L (ref 19–32)
Calcium: 8.6 mg/dL (ref 8.4–10.5)
Creatinine, Ser: 1.51 mg/dL — ABNORMAL HIGH (ref 0.50–1.10)
GFR calc non Af Amer: 46 mL/min — ABNORMAL LOW (ref >90)
GFR, EST AFRICAN AMERICAN: 54 mL/min — AB (ref >90)
Glucose, Bld: 75 mg/dL (ref 70–99)
POTASSIUM: 3.9 meq/L (ref 3.7–5.3)
Sodium: 144 mEq/L (ref 137–147)

## 2014-09-12 LAB — MAGNESIUM: Magnesium: 1.6 mg/dL (ref 1.5–2.5)

## 2014-09-12 LAB — T4, FREE: Free T4: 1.39 ng/dL (ref 0.80–1.80)

## 2014-09-12 MED ORDER — SODIUM CHLORIDE 0.9 % IR SOLN
80.0000 mg | Status: DC
  Filled 2014-09-12: qty 2

## 2014-09-12 MED ORDER — DIAZEPAM 5 MG PO TABS
10.0000 mg | ORAL_TABLET | ORAL | Status: DC
  Filled 2014-09-12: qty 2

## 2014-09-12 MED ORDER — TECHNETIUM TC 99M SESTAMIBI GENERIC - CARDIOLITE
10.0000 | Freq: Once | INTRAVENOUS | Status: AC | PRN
  Administered 2014-09-12: 10 via INTRAVENOUS

## 2014-09-12 MED ORDER — REGADENOSON 0.4 MG/5ML IV SOLN
0.4000 mg | Freq: Once | INTRAVENOUS | Status: AC
  Administered 2014-09-12: 0.4 mg via INTRAVENOUS
  Filled 2014-09-12: qty 5

## 2014-09-12 MED ORDER — CHLORHEXIDINE GLUCONATE 4 % EX LIQD
60.0000 mL | Freq: Once | CUTANEOUS | Status: DC
Start: 2014-09-12 — End: 2014-09-12
  Filled 2014-09-12: qty 60

## 2014-09-12 MED ORDER — CEFAZOLIN SODIUM-DEXTROSE 2-3 GM-% IV SOLR
2.0000 g | INTRAVENOUS | Status: DC
  Filled 2014-09-12: qty 50

## 2014-09-12 MED ORDER — CHLORHEXIDINE GLUCONATE 4 % EX LIQD
60.0000 mL | Freq: Once | CUTANEOUS | Status: DC
  Filled 2014-09-12: qty 60

## 2014-09-12 MED ORDER — PROMETHAZINE HCL 25 MG/ML IJ SOLN
12.5000 mg | Freq: Four times a day (QID) | INTRAMUSCULAR | Status: DC | PRN
  Administered 2014-09-12: 12.5 mg via INTRAVENOUS

## 2014-09-12 MED ORDER — TECHNETIUM TC 99M SESTAMIBI GENERIC - CARDIOLITE
30.0000 | Freq: Once | INTRAVENOUS | Status: AC | PRN
  Administered 2014-09-12: 30 via INTRAVENOUS

## 2014-09-12 MED ORDER — CHLORHEXIDINE GLUCONATE 4 % EX LIQD
60.0000 mL | Freq: Once | CUTANEOUS | Status: AC
  Administered 2014-09-13: 4 via TOPICAL
  Filled 2014-09-12 (×3): qty 60

## 2014-09-12 MED ORDER — PROMETHAZINE HCL 25 MG/ML IJ SOLN
INTRAMUSCULAR | Status: AC
  Filled 2014-09-12: qty 1

## 2014-09-12 MED ORDER — CEFAZOLIN SODIUM-DEXTROSE 2-3 GM-% IV SOLR
2.0000 g | INTRAVENOUS | Status: AC
  Administered 2014-09-13: 2 g via INTRAVENOUS
  Filled 2014-09-12: qty 50

## 2014-09-12 MED ORDER — REGADENOSON 0.4 MG/5ML IV SOLN
INTRAVENOUS | Status: AC
Start: 2014-09-12 — End: 2014-09-12
  Administered 2014-09-12: 0.4 mg via INTRAVENOUS
  Filled 2014-09-12: qty 5

## 2014-09-12 MED ORDER — SODIUM CHLORIDE 0.9 % IV SOLN: INTRAVENOUS | Status: DC

## 2014-09-12 MED ORDER — DIAZEPAM 5 MG PO TABS: 10.0000 mg | ORAL_TABLET | ORAL | Status: DC

## 2014-09-12 NOTE — Progress Notes (Addendum)
[facility] PROGRESS NOTE Interim History: 28 year old female patient with known history of Crohn's disease, and pancytopenia from Crohn's medicine which has now resolved who apparently had been having a Crohn's exacerbation at home as manifested by profuse diarrhea for 3-4 days. On 10/22 patient collapsed at home. She was down at least 10-15 minutes before the fire department arrived and initiated CPR. 15 minutes later EMS arrived and initiated ACLS protocols with CPR for 15 more minutes prior to return of spontaneous circulation. She was defibrillated 4 times for V. fib .evaluated by Cardiology. Started on Sundown protocol. She has undergone 2 echocardiograms which revealed systolic dysfunction with an EF of 20-25%.Plans are to pursue Ischemic w/u  this week to ensure no congenital anomalous. Neurological standpoint she has exam findings concerning for hypoxemic brain injury. She is alert but is having some issues with expressive aphasia and slow response. She also has mild dysphagia. Neurology is following. PT/OT recommending CIR. Stress test pending (11.3.2015), if negative for possible pacer.   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 - Required mech ventilation initially  - now stable on RA  - Initial CXR with bilateral lobe airspace disease, completed course of antibiotics in house. - consulted cards rec EP evaluation Dr. Caryl Comes recommends EP study, for pacer after stress test.   Hypoxic ischemic encephalopathy (HIE): - SLP/PT/OT rec CIR - insurance  approval in process  Bradycardia/Prolonged QT interval  - Per Cards - previous post arrest BB off  - hemodynamically stable   Acute renal failure  - Baseline in Ct < 1.0. - was as high as 3.0, improving 1.5.  Pancytopenia  - History of this due to Crohn's med and was evaluated by Heme/had resolved   Crohn's duodenitis  - GI following - change steroids to orals and Pentasa   Chronic Anemia secondary to chronic disease/Crohn's disease  Baseline hemoglobin 12.3 to 13      DVT prophylaxis: Subcutaneous heparin  Code Status: Full  Family Communication: Father at bedside  Disposition Plan/Expected LOS: Telemetry - CIR evaluation    Consultants:  Cardiology/Dr. Fransico Him  EP/Dr. Virl Axe  Gastroenterology/Dr. Wilford Corner  CIR/Dr. Mitzi Hansen Kirstein's  Antibiotics:  Zosyn 10/22 >10/29  Vancomycin 10/24 >10/25  Micafungin x 1     STUDIES:  10/16 Colonoscopy (Per [facility]) > active duodenitis with areas of villous blunting/atrophy in addition to a small granuloma with multinucleated giant cells as seen in one of the fragments. Given the history of Crohn's disease, the changes are most compatible with involvement by the same disease process  10/22 CT head > neg  10/22 CT cervical spine >neg  10/22 ECHO > RV normal, LVEF 20-25%  10/23 LE Doppler >neg  10/24 ct>Bowel wall thickening of terminal ileum and significant portions of particularly the distal colon compatible with enteritis, favor Crohn's disease in light of history.  SIGNIFICANT EVENTS:  10/22 Admit after several days diarrhea, cardiac arrest at home with prolonged CPR  10/24- rewarmed, follows commands  10/26: weaned on PS all day  10/27: extubated  10/29 transfered to tele   HPI/Subjective: No complains  Objective: Filed Vitals:   09/12/14 0506 09/12/14 1000 09/12/14 1012 09/12/14 1014  BP: 98/53 128/64 129/47 140/59  Pulse: 64     Temp: 97.6 F (36.4 C)     TempSrc: Oral     Resp: 18       Height:      Weight: 74.2 kg (163 lb 9.3 oz)     SpO2: 97%        Exam:  General: Alert, awake, oriented x3. HEENT: No bruits, no goiter.  Heart: Regular rate and rhythm., without murmurs, rubs, gallops.  Lungs: Good air movement, clear Abdomen: Soft, nontender, nondistended, positive bowel sounds.    Data Reviewed: Basic Metabolic Panel:  Recent Labs Lab 09/06/14 0411 09/07/14 0400 09/08/14 0315 09/09/14 0638 09/10/14 1310 09/11/14 0450 09/12/14 0525  NA 144 146 142 144 142 142 144  K 3.5* 3.7 4.3 4.4 3.6* 3.5* 3.9  CL 98 97 103 104 103 106 110  CO2 34* 33* 29 29 28 27 25   GLUCOSE 71 87 96 87 103* 77 75  BUN 25* 24* 24* 23 21 17 14   CREATININE 2.93* 2.54* 2.20* 1.94* 1.81* 1.67* 1.51*  CALCIUM 8.2* 8.2* 8.8 9.0 9.0 8.6 8.6  MG 2.0 1.9 2.5 2.2  --   --  1.6  PHOS 3.5 12.9* 2.9 4.5  --   --   --    Liver Function Tests:  Recent Labs Lab 09/09/14 0638  AST 21  ALT 75*  ALKPHOS 64  BILITOT 0.3  PROT 5.7*  ALBUMIN 2.0*   No results for input(s): LIPASE, AMYLASE in the last 168 hours. No results for input(s): AMMONIA in the last 168 hours. CBC:  Recent Labs Lab 09/06/14 0411 09/07/14 0400 09/08/14 0315 09/09/14 0642  WBC 9.4 9.0 8.3 6.5  HGB 9.3* 8.8* 9.3* 9.2*  HCT 27.2* 25.7* 27.6* 27.6*  MCV 86.9 87.1 90.2 88.7  PLT 99* 122* 189 231   Cardiac Enzymes: No results for input(s): CKTOTAL, CKMB, CKMBINDEX, TROPONINI in the last 168 hours. BNP (last 3 results)  Recent Labs  09/06/14 0411  PROBNP 43778.0*   CBG:  Recent Labs Lab 09/07/14 0341 09/07/14 0839 09/07/14 1130 09/07/14 1243 09/07/14 1552  GLUCAP 88 110* 181* 115* 150*    Recent Results (from the past 240 hour(s))  Stool culture     Status: None   Collection Time: 09/04/14  3:43 PM  Result Value Ref Range Status   Specimen Description STOOL  Final   Special Requests NONE  Final   Culture   Final    NO SALMONELLA, SHIGELLA, CAMPYLOBACTER, YERSINIA, OR E.COLI 0157:H7  ISOLATED Note: REDUCED NORMAL FLORA PRESENT Performed at [facility]   Report Status 09/08/2014 FINAL  Final     Studies: No results found.  Scheduled Meds: . acidophilus  2 capsule Oral Daily  . antiseptic oral rinse  7 mL Mouth Rinse BID  . famotidine  20 mg Oral QHS  . heparin subcutaneous  5,000 Units Subcutaneous 3 times per day  . mesalamine  500 mg Oral TID  . predniSONE  40 mg Oral Q breakfast  . promethazine       Continuous Infusions: . sodium chloride 100 mL/hr at 09/12/14 0148     Charlynne Cousins  [facility] Pager [phone] If 8PM-8AM, please contact night-coverage at [email or web address], password [facility]  09/12/2014, 11:44 AM  LOS: 12 days

## 2014-09-12 NOTE — Progress Notes (Signed)
     The patient was seen in nuclear medicine for a lexiscan myoview. She tolerated the procedure well. No acute ST or TW changes on ECG.    Perry Mount PA-C  [facility]

## 2014-09-12 NOTE — Progress Notes (Signed)
      Patient and family and I had a long discussion about the pros and cons of ICD placement. Julie Saunders is very nervous about the surgery. I provided information and support and she is willing to proceed with ICD placement in the AM.  Scheduled tomorrow at [place] PA-C  [facility]

## 2014-09-12 NOTE — Progress Notes (Signed)
Pt and her dad requesting to speak to someone r/t cardiac  procedure being done tomorrow.  Katie, PA informed.  Instructed she will have someone talk to pt.  Informed by pt that PA called and spoke with her over phone, and had answered questions.  Karie Kirks, Therapist, sports.

## 2014-09-12 NOTE — Progress Notes (Signed)
Rehab admissions - I am following pt's case and noted that defribillator is to be placed tomorrow. We will consider possible inpatient rehab admit pending her completed medical work up and medical clearance, insurance authorization and our bed availability. I will need updated therapy notes to document pt's overall progress with activity and cognition to share with insurance.  I met with pt and her father to share this update and I will check on pt's status tomorrow.  I called and updated Camellia, case Freight forwarder and Butch Penny, Education officer, museum. Please call me with any questions.   Nanetta Batty, PT Rehabilitation Admissions Coordinator [phone]

## 2014-09-12 NOTE — Progress Notes (Signed)
Speech Language Pathology Treatment: Dysphagia;Cognitive-Linquistic  Patient Details Name: Julie Saunders MRN: [id number] DOB: 29-Aug-1986 Today's Date: 09/12/2014 Time: 7989-2119 SLP Time Calculation (min): 27 min  Assessment / Plan / Recommendation Clinical Impression  Pt was standing with her walker, "stretching my legs."  Father at bedside states "She wants to move around."  Pt was pleasant and appropriate in conversation, with some pragmatic changes, but much improved over previous sessions.  Pt was able to focus and attend to questions, and demonstrated good problem solving and awareness when discussing discharge options.  Pt and family are hoping pt will go to CIR, but are considering other options, such as SNF vs. Home with home health.  Pt states she prefers the latter, if insurance will not cover CIR.  Pt's ultimate goal is to resume college (was attending [employer]) for Radiology Tech program.  CIR would be best investment for this well motivated 28 year old who would benefit from higher level cognitive reorganization/retraining.   HPI HPI: 28 y/o female admitted 10/22 after VFib arrest at home and prolonged CPR (prolonged down time) when presenting to the ER.  Etiology felt to be hypokalemia from diarrhea.  To ICU for hypothermia protocol. No h/o swallowing difficulty reported.   Pertinent Vitals Pain Assessment: No/denies pain  SLP Plan  Continue with current plan of care    Recommendations Diet recommendations: Regular;Thin liquid Liquids provided via: Cup;Straw Medication Administration: Whole meds with liquid Supervision: Intermittent supervision to cue for compensatory strategies Compensations: Slow rate;Small sips/bites;Check for pocketing;Check for anterior loss Postural Changes and/or Swallow Maneuvers: Seated upright 90 degrees              General recommendations: Rehab consult Oral Care Recommendations: Oral care BID;Patient independent with oral care Follow up  Recommendations: Inpatient Rehab Plan: Continue with current plan of care    GO     Julie Saunders T 09/12/2014, 4:30 PM

## 2014-09-12 NOTE — Progress Notes (Addendum)
Patient Name: Julie Saunders      SUBJECTIVE without complaint   Past Medical History  Diagnosis Date  . Crohn disease   . Migraine   . Allergy   . Pancytopenia 11/23/2012    Scheduled Meds:  Scheduled Meds: . acidophilus  2 capsule Oral Daily  . antiseptic oral rinse  7 mL Mouth Rinse BID  . famotidine  20 mg Oral QHS  . heparin subcutaneous  5,000 Units Subcutaneous 3 times per day  . mesalamine  500 mg Oral TID  . predniSONE  40 mg Oral Q breakfast   Continuous Infusions: . sodium chloride 100 mL/hr at 09/12/14 0148   sodium chloride, acetaminophen (TYLENOL) oral liquid 160 mg/5 mL, RESOURCE THICKENUP CLEAR, sodium chloride    PHYSICAL EXAM Filed Vitals:   09/11/14 0506 09/11/14 1337 09/11/14 2003 09/12/14 0506  BP: 125/56 125/67 127/69 98/53  Pulse: 46 44 55 64  Temp: 98.5 F (36.9 C) 98.4 F (36.9 C) 97.8 F (36.6 C) 97.6 F (36.4 C)  TempSrc: Oral Oral Oral Oral  Resp: 18 16 18 18   Height:      Weight: 165 lb 11.2 oz (75.161 kg)   163 lb 9.3 oz (74.2 kg)  SpO2: 98% 97% 98% 97%   Well developed and nourished in no acute distress HENT normal Neck supple with JVP-flat Carotids brisk and full without bruits Clear Regular rate and rhythm, no murmurs or gallops Abd-soft with active BS without hepatomegaly No Clubbing cyanosis edema Skin-warm and dry A & Oriented  Grossly normal sensory and motor function   TELEMETRY: Reviewed telemetry pt in si9nus with some brady and pvc   Intake/Output Summary (Last 24 hours) at 09/12/14 0834 Last data filed at 09/12/14 0700  Gross per 24 hour  Intake   1660 ml  Output    600 ml  Net   1060 ml    LABS: Basic Metabolic Panel:  Recent Labs Lab 09/06/14 0411 09/07/14 0400 09/08/14 0315 09/09/14 0638 09/10/14 1310 09/11/14 0450 09/12/14 0525  NA 144 146 142 144 142 142 144  K 3.5* 3.7 4.3 4.4 3.6* 3.5* 3.9  CL 98 97 103 104 103 106 110  CO2 34* 33* 29 29 28 27 25   GLUCOSE 71 87 96 87 103*  77 75  BUN 25* 24* 24* 23 21 17 14   CREATININE 2.93* 2.54* 2.20* 1.94* 1.81* 1.67* 1.51*  CALCIUM 8.2* 8.2* 8.8 9.0 9.0 8.6 8.6  MG 2.0 1.9 2.5 2.2  --   --  1.6  PHOS 3.5 12.9* 2.9 4.5  --   --   --    Cardiac Enzymes: No results for input(s): CKTOTAL, CKMB, CKMBINDEX, TROPONINI in the last 72 hours. CBC:  Recent Labs Lab 09/06/14 0411 09/07/14 0400 09/08/14 0315 09/09/14 0642  WBC 9.4 9.0 8.3 6.5  HGB 9.3* 8.8* 9.3* 9.2*  HCT 27.2* 25.7* 27.6* 27.6*  MCV 86.9 87.1 90.2 88.7  PLT 99* 122* 189 231   PROTIME: No results for input(s): LABPROT, INR in the last 72 hours. Liver Function Tests: No results for input(s): AST, ALT, ALKPHOS, BILITOT, PROT, ALBUMIN in the last 72 hours. No results for input(s): LIPASE, AMYLASE in the last 72 hours. BNP: BNP (last 3 results)  Recent Labs  09/06/14 0411  PROBNP 43778.0*   D-Dimer: No results for input(s): DDIMER in the last 72 hours. Hemoglobin A1C: No results for input(s): HGBA1C in the last 72 hours. Fasting Lipid Panel: No  results for input(s): CHOL, HDL, LDLCALC, TRIG, CHOLHDL, LDLDIRECT in the last 72 hours. Thyroid Function Tests:  Recent Labs  09/11/14 1300  TSH 4.260       ASSESSMENT AND PLAN:  Principal Problem:   Cardiac arrest Active Problems:   Prolonged QT interval   Sinus bradycardia   Pancytopenia   Hypokalemia   Acute respiratory failure with hypoxia   Hypoxic ischemic encephalopathy (HIE)   Acute renal failure   Crohn's duodenitis   Anemia   Ventricular fibrillation   Cardiomyopathy, transient  Flecainide challenge negative for Brugada  Will need outpt gene testing  The pts situation is clarifying itself with underlying heart disease suggested bysinus node dysfunction, persistent QT prolongation, familyhx of cardiac arrest and Hypokalemia which may well be contributory but may also have been secondary  \will do myoview today And anticpate dual chamber ICD in am Have reviewed the  potential benefits and risks of ICD implantation including but not limited to death, perforation of heart or lung, lead dislodgement, infection,  device malfunction and inappropriate shocks.  The patient and familyexpress understanding  and are willing to proceed.    3  Signed, Virl Axe MD  09/12/2014

## 2014-09-12 NOTE — Care Management Note (Addendum)
  Page 2 of 2   09/14/2014     10:58:42 AM CARE MANAGEMENT NOTE 09/14/2014  Patient:  Julie Saunders, Julie Saunders   Account Number:  [id number]  Date Initiated:  09/04/2014  Documentation initiated by:  Elissa Hefty  Subjective/Objective Assessment:   adm w cardiac arrest, vent     Action/Plan:   lives w fam, pcp dr Cornelius Moras   Anticipated DC Date:  09/14/2014   Anticipated DC Plan:  IP REHAB FACILITY  In-house referral  Clinical Social Worker      DC Forensic scientist  CM consult      [facility] Choice  HOME HEALTH   Choice offered to / List presented to:  C-1 Patient        [place] arranged  HH-1 RN  [place]      [place].   Status of service:  Completed, signed off Medicare Important Message given?  NO (If response is "NO", the following Medicare IM given date fields will be blank) Date Medicare IM given:   Medicare IM given by:   Date Additional Medicare IM given:   Additional Medicare IM given by:    Discharge Disposition:    Per UR Regulation:  Reviewed for med. necessity/level of care/duration of stay  If discussed at [place] of Stay Meetings, dates discussed:   09/05/2014  09/07/2014  09/12/2014  09/14/2014    Comments:  Donella Stade Meera Vasco RN, BSN, MSHL, CCM  Nurse - Case Manager,  ([facility] [place])  [phone]  09/14/2014 PT/OT RECS:  Home with HHS Dispo Plan:  Home with HHS:  RN, PT, OT, HHA, SW ([facility] notified) Patient will d/c home to Grandmothers home in order to have 24/7 supervised care Sharlee Rufino [place], [place]  [phone]  Disease [facility] Medication [place] Prevention PT/OT:  Home services with goal of  Progressing  patient to OP Neuro Rehabiliatation Services SW:  [facility]  PCP:  Dr. Maurine Cane     09/12/14 Fuller Mandril, RN, BSN, [place] [phone] Defribillator is to be  placed tomorrow. CIR will consider possible inpatient rehab admit pending her completed medical work up and medical clearance, insurance authorization and our bed availability.  CSW explained to patient that if there is a case CIR doesn't accept her as a patient, then there are two other options.CSW explained home rehab and nursing rehab facility options.  Patient explained that after discharge her plan was to be at home with her grandmother. Patient stated she will discuss what would be the best option, after speaking to her father and grandmother.

## 2014-09-12 NOTE — Plan of Care (Signed)
Problem: SLP Dysphagia Goals Goal: Patient will utilize recommended strategies Patient will utilize recommended strategies during swallow to increase swallowing safety with  Outcome: Completed/Met Date Met:  09/12/14  Problem: SLP Cognition Goals Goal: Patient will demonstrate attention to functional Patient will demonstrate attention to functional task with  Outcome: Completed/Met Date Met:  09/12/14 Goal: Patient will utilize external memory aids Patient will utilize external memory aids to facilitate recall of information for improved safety with  Outcome: Progressing Goal: Patient will demonstrate problem solving skills Patient will demonstrate problem solving skills during functional ADL's with  Outcome: Progressing Goal: Patient will demonstrate awareness during Patient will demonstrate awareness during functional ADL for improved safety  Outcome: Progressing     

## 2014-09-12 NOTE — Clinical Social Work Psychosocial (Signed)
Clinical Social Work Department BRIEF PSYCHOSOCIAL ASSESSMENT 09/12/2014  Patient:  Julie Saunders, Julie Saunders     Account Number:  [id number]     Admit date:  08/31/2014  Clinical Social Worker:  Gwenevere Abbot,  Date/Time:  09/12/2014 03:00 PM  Referred by:  Physician  Date Referred:  09/12/2014 Referred for  SNF Placement   Other Referral:   Interview type:  Other - See comment Other interview type:   Patient and father.    PSYCHOSOCIAL DATA Living Status:  FAMILY Admitted from facility:   Level of care:  Skilled Nursing Facility Primary support name:   Primary support relationship to patient:   Degree of support available:    CURRENT CONCERNS Current Concerns  Post-Acute Placement   Other Concerns:    SOCIAL WORK ASSESSMENT / PLAN CSW and CSW intern spoke with patient and her father at bedside. CSW explained to patient that if there is a case CIR doesn't accept her as a patient, then there are two other options.CSW explained home rehab and nursing rehab facility options.  Patient explained that after discharge her plan was to be at home with her grandmother. Patient stated she will discuss what would be the best option, after speaking to her father and grandmother. CSW to follow up.   Assessment/plan status:  Information/Referral to [employer] Other assessment/ plan:   Information/referral to community resources:   Patient and her father was provided with a list of nursing rehab facilities.    PATIENT'S/FAMILY'S RESPONSE TO PLAN OF CARE: Patient and father was pleasant and agreeable to plan of care options. CSW will follow.

## 2014-09-12 NOTE — Progress Notes (Addendum)
Physical Therapy Treatment Patient Details Name: Julie Saunders MRN: [id number] DOB: 1985/12/22 Today's Date: September 25, 2014    History of Present Illness Pt adm after cardiac arrest at home. Pt with prolonged down time. Hypothermia protocol. Pt extubated 10/27. Pt with history of Crohns. Cardiac work up has found prolonged QT interval and pt for ICD 11/4.    PT Comments    Pt continues to make steady progress. Continues to be excellent rehab candidate.  Follow Up Recommendations  CIR     Equipment Recommendations  Rolling walker with 5" wheels    Recommendations for Other Services       Precautions / Restrictions Precautions Precautions: Fall    Mobility  Bed Mobility Overal bed mobility: Needs Assistance Bed Mobility: Supine to Sit;Sit to Supine     Supine to sit: Min assist (father helped up) Sit to supine: Supervision   General bed mobility comments: Pt likely could have performed supine to sit without physical assistance if given enough time.  Transfers Overall transfer level: Needs assistance Equipment used: Rolling walker (2 wheeled) Transfers: Sit to/from Stand Sit to Stand: Min assist         General transfer comment: Verbal cues for hand placment. Assist to bring hips up.  Ambulation/Gait Ambulation/Gait assistance: Min assist Ambulation Distance (Feet): 190 Feet Assistive device: Rolling walker (2 wheeled) Gait Pattern/deviations: Step-through pattern;Narrow base of support Gait velocity: decr Gait velocity interpretation: Below normal speed for age/gender General Gait Details: Verbal cues to not step past walker with feet.   Stairs            Wheelchair Mobility    Modified Rankin (Stroke Patients Only)       Balance Overall balance assessment: Needs assistance Sitting-balance support: No upper extremity supported;Feet supported Sitting balance-Leahy Scale: Good     Standing balance support: Single extremity supported Standing  balance-Leahy Scale: Poor Standing balance comment: upper extremity support for standing.                    Cognition Arousal/Alertness: Awake/alert Behavior During Therapy: Impulsive Overall Cognitive Status: Impaired/Different from baseline Area of Impairment: Memory     Memory: Decreased short-term memory              Exercises      General Comments        Pertinent Vitals/Pain Pain Assessment: No/denies pain    Home Living                      Prior Function            PT Goals (current goals can now be found in the care plan section) Progress towards PT goals: Progressing toward goals    Frequency  Min 3X/week    PT Plan Current plan remains appropriate    Co-evaluation             End of Session Equipment Utilized During Treatment: Gait belt Activity Tolerance: Patient tolerated treatment well Patient left: with call bell/phone within reach;with family/visitor present;in bed     Time: 3716-9678 PT Time Calculation (min): 16 min  Charges:  $Gait Training: 8-22 mins                    G Codes:      Kei Langhorst September 25, 2014, 9:10 AM  [employer] PT [phone]

## 2014-09-12 NOTE — Progress Notes (Signed)
Pt given 12.5 mg Phenergan IV slow push for 1 small emesis and nausea following Lexiscan stress test.  Order given by Nell Range, PA.

## 2014-09-13 ENCOUNTER — Inpatient Hospital Stay (HOSPITAL_COMMUNITY): Payer: 59 | Admitting: Certified Registered"

## 2014-09-13 ENCOUNTER — Encounter (HOSPITAL_COMMUNITY)
Admission: EM | Disposition: A | Discharge status: Home Health | Payer: Self-pay | Source: Home / Self Care | Attending: Pulmonary Disease

## 2014-09-13 DIAGNOSIS — I429 Cardiomyopathy, unspecified: Secondary | ICD-10-CM

## 2014-09-13 DIAGNOSIS — I469 Cardiac arrest, cause unspecified: Secondary | ICD-10-CM

## 2014-09-13 HISTORY — PX: IMPLANTABLE CARDIOVERTER DEFIBRILLATOR IMPLANT: SHX5473

## 2014-09-13 LAB — BASIC METABOLIC PANEL
Anion gap: 10 (ref 5–15)
BUN: 12 mg/dL (ref 6–23)
CHLORIDE: 109 meq/L (ref 96–112)
CO2: 24 meq/L (ref 19–32)
CREATININE: 1.51 mg/dL — AB (ref 0.50–1.10)
Calcium: 8.4 mg/dL (ref 8.4–10.5)
GFR calc Af Amer: 54 mL/min — ABNORMAL LOW (ref >90)
GFR calc non Af Amer: 46 mL/min — ABNORMAL LOW (ref >90)
GLUCOSE: 84 mg/dL (ref 70–99)
Potassium: 3.4 mEq/L — ABNORMAL LOW (ref 3.7–5.3)
Sodium: 143 mEq/L (ref 137–147)

## 2014-09-13 LAB — PROTIME-INR
INR: 1.02 (ref 0.00–1.49)
Prothrombin Time: 13.6 seconds (ref 11.6–15.2)

## 2014-09-13 SURGERY — IMPLANTABLE CARDIOVERTER DEFIBRILLATOR IMPLANT
Anesthesia: Monitor Anesthesia Care

## 2014-09-13 MED ORDER — ENSURE COMPLETE PO LIQD
237.0000 mL | Freq: Two times a day (BID) | ORAL | Status: DC | PRN
  Filled 2014-09-13: qty 237

## 2014-09-13 MED ORDER — PROPOFOL INFUSION 10 MG/ML OPTIME
INTRAVENOUS | Status: DC | PRN
  Administered 2014-09-13: 50 ug/kg/min via INTRAVENOUS

## 2014-09-13 MED ORDER — ONDANSETRON HCL 4 MG/2ML IJ SOLN: 4.0000 mg | Freq: Four times a day (QID) | INTRAMUSCULAR | Status: DC | PRN

## 2014-09-13 MED ORDER — ACETAMINOPHEN 325 MG PO TABS: 325.0000 mg | ORAL_TABLET | ORAL | Status: DC | PRN

## 2014-09-13 MED ORDER — SODIUM CHLORIDE 0.9 % IV SOLN: INTRAVENOUS | Status: AC

## 2014-09-13 MED ORDER — FENTANYL CITRATE 0.05 MG/ML IJ SOLN
INTRAMUSCULAR | Status: DC | PRN
  Administered 2014-09-13: 50 ug via INTRAVENOUS
  Administered 2014-09-13 (×4): 25 ug via INTRAVENOUS

## 2014-09-13 MED ORDER — CEFAZOLIN SODIUM 1-5 GM-% IV SOLN
1.0000 g | Freq: Four times a day (QID) | INTRAVENOUS | Status: AC
  Administered 2014-09-13 – 2014-09-14 (×3): 1 g via INTRAVENOUS
  Filled 2014-09-13 (×3): qty 50

## 2014-09-13 MED ORDER — PROPOFOL 10 MG/ML IV BOLUS
INTRAVENOUS | Status: DC | PRN
  Administered 2014-09-13: 10 mg via INTRAVENOUS
  Administered 2014-09-13: 20 mg via INTRAVENOUS
  Administered 2014-09-13: 50 mg via INTRAVENOUS

## 2014-09-13 MED ORDER — LIDOCAINE HCL (PF) 1 % IJ SOLN
INTRAMUSCULAR | Status: AC
  Filled 2014-09-13: qty 60

## 2014-09-13 MED ORDER — SODIUM CHLORIDE 0.9 % IV SOLN
INTRAVENOUS | Status: DC | PRN
  Administered 2014-09-13 (×2): via INTRAVENOUS

## 2014-09-13 MED ORDER — MIDAZOLAM HCL 5 MG/5ML IJ SOLN
INTRAMUSCULAR | Status: DC | PRN
  Administered 2014-09-13: 2 mg via INTRAVENOUS

## 2014-09-13 MED ORDER — SPIRONOLACTONE 12.5 MG HALF TABLET
12.5000 mg | ORAL_TABLET | Freq: Every day | ORAL | Status: DC
  Administered 2014-09-14: 12.5 mg via ORAL
  Filled 2014-09-13 (×2): qty 1

## 2014-09-13 NOTE — Anesthesia Preprocedure Evaluation (Addendum)
Anesthesia Evaluation  Patient identified by MRN, date of birth, ID band Patient awake    Reviewed: Allergy & Precautions, H&P , NPO status , Patient's Chart, lab work & pertinent test results  Airway Mallampati: II  TM Distance: >3 FB Neck ROM: Full    Dental  (+) Teeth Intact, Dental Advisory Given   Pulmonary  breath sounds clear to auscultation        Cardiovascular Rhythm:Regular Rate:Normal  Cardiac history noted. CE   Neuro/Psych    GI/Hepatic negative GI ROS, Neg liver ROS,   Endo/Other  negative endocrine ROS  Renal/GU Renal disease     Musculoskeletal   Abdominal   Peds  Hematology   Anesthesia Other Findings   Reproductive/Obstetrics                           Anesthesia Physical Anesthesia Plan  ASA: III  Anesthesia Plan: MAC   Post-op Pain Management:    Induction: Intravenous  Airway Management Planned: Simple Face Mask  Additional Equipment:   Intra-op Plan:   Post-operative Plan:   Informed Consent:   Plan Discussed with: CRNA, Anesthesiologist and Surgeon  Anesthesia Plan Comments:         Anesthesia Quick Evaluation

## 2014-09-13 NOTE — CV Procedure (Signed)
Julie Saunders [id number]  [id number]  Preop Dx: aborted cardiac arrest  Sinus bradycardia Postop Dx same/  NYHA Class 1  Cx: none apparent    Procedure: dual  chamber ICD implantation with intraoperative defibrillation threshold testing  Following the obtaining of informed consent the patient was brought to the electrophysiology laboratory in place of the fluoroscopic table in the supine position. Anesthesia assisted with sedation and airway   After routine prep and drape, lidocaine was infiltrated in the prepectoral subclavicular region and an incision was made and carried down to the layer of the prepectoral fascia using electrocautery and sharp dissection. A pocket was formed similarly.  Thereafter  attention was turned to gaining access to the extrathoracic left subclavian vein which was accomplished without w difficulty and without the aspiration of air or puncture of the artery.Two separate venipunctures were accomplished  Sequentially  A 9 French sheath  And 79F sheath were placed through which were   passed a Medtronic MRI compatible 6935 single coil active fixation defibrillator lead, model 6935 serial number [id number] and a Medtronic MRI compatible 5076 active fixation atrial lead, serial number [id number] .  They  Were   passed under fluoroscopic guidance to the right ventricular apex and R atrial appendage   respectively.    In its location the bipolar R wave was 7.0 millivolts, impedance was 505 ohms, the pacing threshold was 0.7 volts at 0.5 msec. Current at threshold was 1.0 mA.  There was no diaphragmatic pacing at 10 V. The current of injury was brisk .  The bipolar P wave was 1.4 millivolts, impedance was 639 ohms, the pacing threshold was 2.2 volts at 0.5 msec. Current at threshold was 3.3 mA.  There was no diaphragmatic pacing at 10 V. The current of injury was brisk .   The leads were secured to the prepectoral fascia and then attached to a , serial number   [id number].  Through the device, the bipolar R wave was 7.4 millivolts, impedance was 410 ohms, the pacing threshold was 0.5 volts at 0.5 msec.  The bipolar P wave was 1.4 millivolts, impedance was 600 ohms, the pacing threshold was 1.5 volts at 0.5 msec. High-voltage impedance was  39 ohms.      The pocket was copiously irrigated with antibiotic containing saline solution. Hemostasis was assured, and the device and the leads were placed in the pocket and secured to the prepectoral fascia.  The wound was closed in 2 layers in normal fashion. The wound was washed dried and a DERMABOND dressing was then applied. Needle counts, sponge counts and instrument counts were correct at the end of the procedure according to the staff.   m.

## 2014-09-13 NOTE — Progress Notes (Signed)
Patient Name: Julie Saunders      SUBJECTIVE without complaint   familyu confused at mixed messages Myoview normal X   Mild decrease in EF  Past Medical History  Diagnosis Date  . Crohn disease   . Migraine   . Allergy   . Pancytopenia 11/23/2012    Scheduled Meds:  Scheduled Meds: . acidophilus  2 capsule Oral Daily  . antiseptic oral rinse  7 mL Mouth Rinse BID  .  ceFAZolin (ANCEF) IV  2 g Intravenous On Call  . chlorhexidine  60 mL Topical Once  . diazepam  10 mg Oral On Call  . famotidine  20 mg Oral QHS  . gentamicin irrigation  80 mg Irrigation On Call  . heparin subcutaneous  5,000 Units Subcutaneous 3 times per day  . mesalamine  500 mg Oral TID  . predniSONE  40 mg Oral Q breakfast   Continuous Infusions: . sodium chloride Stopped (09/13/14 0500)  . sodium chloride    . sodium chloride 50 mL/hr at 09/13/14 0500   sodium chloride, acetaminophen (TYLENOL) oral liquid 160 mg/5 mL, promethazine, RESOURCE THICKENUP CLEAR, sodium chloride    PHYSICAL EXAM Filed Vitals:   09/12/14 1349 09/12/14 2051 09/13/14 0553 09/13/14 0958  BP: 132/59 117/54 119/67 139/67  Pulse: 44 54 60 45  Temp: 97.8 F (36.6 C) 98.1 F (36.7 C) 98.5 F (36.9 C)   TempSrc: Oral Oral Oral   Resp: 18 18 18 16   Height:      Weight:   168 lb 4.8 oz (76.34 kg)   SpO2: 100% 100% 98% 100%   Well developed and nourished in no acute distress HENT normal Neck supple with JVP-flat Carotids brisk and full without bruits Clear Regular rate and rhythm, no murmurs or gallops Abd-soft with active BS without hepatomegaly No Clubbing cyanosis edema Skin-warm and dry A & Oriented  Grossly normal sensory and motor function   TELEMETRY: Reviewed telemetry pt in sinus   Intake/Output Summary (Last 24 hours) at 09/13/14 1258 Last data filed at 09/13/14 1020  Gross per 24 hour  Intake 926.67 ml  Output    600 ml  Net 326.67 ml    LABS: Basic Metabolic Panel:  Recent Labs Lab  09/07/14 0400 09/08/14 0315 09/09/14 0638 09/10/14 1310 09/11/14 0450 09/12/14 0525 09/13/14 0525  NA 146 142 144 142 142 144 143  K 3.7 4.3 4.4 3.6* 3.5* 3.9 3.4*  CL 97 103 104 103 106 110 109  CO2 33* 29 29 28 27 25 24   GLUCOSE 87 96 87 103* 77 75 84  BUN 24* 24* 23 21 17 14 12   CREATININE 2.54* 2.20* 1.94* 1.81* 1.67* 1.51* 1.51*  CALCIUM 8.2* 8.8 9.0 9.0 8.6 8.6 8.4  MG 1.9 2.5 2.2  --   --  1.6  --   PHOS 12.9* 2.9 4.5  --   --   --   --    Cardiac Enzymes: No results for input(s): CKTOTAL, CKMB, CKMBINDEX, TROPONINI in the last 72 hours. CBC:  Recent Labs Lab 09/07/14 0400 09/08/14 0315 09/09/14 0642  WBC 9.0 8.3 6.5  HGB 8.8* 9.3* 9.2*  HCT 25.7* 27.6* 27.6*  MCV 87.1 90.2 88.7  PLT 122* 189 231   PROTIME:  Recent Labs  09/13/14 0505  LABPROT 13.6  INR 1.02   Liver Function Tests: No results for input(s): AST, ALT, ALKPHOS, BILITOT, PROT, ALBUMIN in the last 72 hours. No results for  input(s): LIPASE, AMYLASE in the last 72 hours. BNP: BNP (last 3 results)  Recent Labs  09/06/14 0411  PROBNP 43778.0*   D-Dimer: No results for input(s): DDIMER in the last 72 hours. Hemoglobin A1C: No results for input(s): HGBA1C in the last 72 hours. Fasting Lipid Panel: No results for input(s): CHOL, HDL, LDLCALC, TRIG, CHOLHDL, LDLDIRECT in the last 72 hours. Thyroid Function Tests:  Recent Labs  09/11/14 1300  TSH 4.260       ASSESSMENT AND PLAN:  Principal Problem:   Cardiac arrest Active Problems:   Prolonged QT interval   Sinus bradycardia   Pancytopenia   Hypokalemia   Acute respiratory failure with hypoxia   Hypoxic ischemic encephalopathy (HIE)   Acute renal failure   Crohn's duodenitis   Anemia   Ventricular fibrillation   Cardiomyopathy, transient   a  Will need outpt gene testing  The pts situation is clarifying itself with underlying heart disease suggested by sinus node dysfunction, persistent QT prolongation, familyhx of  cardiac arrest LV dysfunction The Hypokalemia   may well be contributory but may also have been secondary to arrest.. The other factors prompt ICD for secondary prevention  Will begin aldactone for potassium sparing    Will proceed with ICD Have reviewed the potential benefits and risks of ICD implantation including but not limited to death, perforation of heart or lung, lead dislodgement, infection,  device malfunction and inappropriate shocks.  The patient and familyexpress understanding  and are willing to proceed.    3  Signed, Virl Axe MD  09/13/2014

## 2014-09-13 NOTE — Progress Notes (Signed)
Rehab admissions - I am following pt's case and noted that pt had ICD placement today. I went to pt's room but pt/family were not there at this time.  We will follow pt's case and would like to see how pt is functionally doing with therapies tomorrow morning. We need updated therapy notes to share with insurance company as we seek insurance authorization for inpatient rehab.   I spoke with rehab office to request that PT/OT see pt tomorrow am if possible to help determine pt's overall functional levels.   I will check on pt's status tomorrow and have discussed pt's case with Butch Penny, Education officer, museum.  We will consider possible inpatient rehab admission pending pt's completed medical work up and medical clearance, insurance authorization and our bed availability.  Please call me with any questions. Thanks.  Nanetta Batty, PT Rehabilitation Admissions Coordinator [phone]

## 2014-09-13 NOTE — Progress Notes (Signed)
[facility] PROGRESS NOTE Interim History: 28 year old female patient with known history of Crohn's disease, and pancytopenia from Crohn's medicine which has now resolved who apparently had been having a Crohn's exacerbation at home as manifested by profuse diarrhea for 3-4 days. On 10/22 patient collapsed at home. She was down at least 10-15 minutes before the fire department arrived and initiated CPR. 15 minutes later EMS arrived and initiated ACLS protocols with CPR for 15 more minutes prior to return of spontaneous circulation. She was defibrillated 4 times for V. fib .evaluated by Cardiology. She was hypokalemic with K of 2.6 and QT was prolonged. She has undergone 2 echocardiograms which revealed systolic dysfunction with an EF of 20-25%. From neurological standpoint, she has exam findings concerning for hypoxemic brain injury. She is alert but is having some issues with expressive aphasia and slow response. She also has mild dysphagia.  PT/OT recommending CIR.    Filed Weights   09/11/14 0506 09/12/14 0506 09/13/14 0553  Weight: 75.161 kg (165 lb 11.2 oz) 74.2 kg (163 lb 9.3 oz) 76.34 kg (168 lb 4.8 oz)        Intake/Output Summary (Last 24 hours) at 09/13/14 1341 Last data filed at 09/13/14 1333  Gross per 24 hour  Intake 926.67 ml  Output    600 ml  Net 326.67 ml   HPI/Subjective: No complaints today  Assessment/Plan: Acute respiratory failure with hypoxia in the setting of Cardiac arrest (VF) - Post arrest CM with EF 25% - stress test 11.3.2015 am. - Required mech ventilation initially  - now stable on RA  - Initial CXR with bilateral lobe airspace disease, completed course of antibiotics in house. - consulted cards rec EP evaluation Dr. Caryl Comes recommends EP study, for pacer after stress test.   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 - was as high as 3.0-  improved and now steady at 1.5.  Pancytopenia  - History of this due to Crohn's meds - evaluated by Heme - resolved   Crohn's duodenitis  - GI following - changed steroids to orals and Pentasa   Chronic Anemia secondary to chronic disease/Crohn's disease  Baseline hemoglobin 12.3 to 13     DVT prophylaxis: Subcutaneous heparin  Code Status: Full  Family Communication: Father at bedside  Disposition Plan/Expected LOS:  CIR evaluation    Consultants:  Cardiology/Dr. Fransico Him  EP/Dr. Virl Axe  Gastroenterology/Dr. Wilford Corner  CIR/Dr. Mitzi Hansen [facility]  Antibiotics:  Zosyn 10/22 >10/29  Vancomycin 10/24 >10/25  Micafungin x 1     STUDIES:  10/16 Colonoscopy (Per [facility]) > active duodenitis with areas of villous blunting/atrophy in addition to a small granuloma with multinucleated giant cells as seen in one of the fragments. Given the history of Crohn's disease, the changes are most compatible with involvement by the same disease process  10/22 CT head > neg  10/22 CT cervical spine >neg  10/22 ECHO > RV normal, LVEF 20-25%  10/23 LE Doppler >neg  10/24 ct>Bowel wall thickening of terminal ileum and significant portions of particularly the distal colon compatible with enteritis, favor Crohn's disease in light of history.  SIGNIFICANT EVENTS:  10/22 Admit after several days diarrhea, cardiac arrest at home with prolonged CPR  10/24- rewarmed, follows commands  10/26: weaned on PS all day  10/27: extubated  10/29 transfered to tele     Objective: Filed Vitals:   09/12/14 1349 09/12/14 2051 09/13/14 0553 09/13/14 0958  BP: 132/59 117/54 119/67 139/67  Pulse: 44 54 60 45  Temp: 97.8 F (36.6 C) 98.1 F (36.7 C) 98.5 F (36.9 C)   TempSrc: Oral Oral Oral   Resp: 18 18 18 16     Height:      Weight:   76.34 kg (168 lb 4.8 oz)   SpO2: 100% 100% 98% 100%     Exam:  General: Alert, awake, oriented x3. HEENT: No bruits, no goiter.  Heart: Regular rate and rhythm., without murmurs, rubs, gallops.  Lungs: Good air movement, clear Abdomen: Soft, nontender, nondistended, positive bowel sounds.    Data Reviewed: Basic Metabolic Panel:  Recent Labs Lab 09/07/14 0400 09/08/14 0315 09/09/14 0638 09/10/14 1310 09/11/14 0450 09/12/14 0525 09/13/14 0525  NA 146 142 144 142 142 144 143  K 3.7 4.3 4.4 3.6* 3.5* 3.9 3.4*  CL 97 103 104 103 106 110 109  CO2 33* 29 29 28 27 25 24   GLUCOSE 87 96 87 103* 77 75 84  BUN 24* 24* 23 21 17 14 12   CREATININE 2.54* 2.20* 1.94* 1.81* 1.67* 1.51* 1.51*  CALCIUM 8.2* 8.8 9.0 9.0 8.6 8.6 8.4  MG 1.9 2.5 2.2  --   --  1.6  --   PHOS 12.9* 2.9 4.5  --   --   --   --    Liver Function Tests:  Recent Labs Lab 09/09/14 0638  AST 21  ALT 75*  ALKPHOS 64  BILITOT 0.3  PROT 5.7*  ALBUMIN 2.0*   No results for input(s): LIPASE, AMYLASE in the last 168 hours. No results for input(s): AMMONIA in the last 168 hours. CBC:  Recent Labs Lab 09/07/14 0400 09/08/14 0315 09/09/14 0642  WBC 9.0 8.3 6.5  HGB 8.8* 9.3* 9.2*  HCT 25.7* 27.6* 27.6*  MCV 87.1 90.2 88.7  PLT 122* 189 231   Cardiac Enzymes: No results for input(s): CKTOTAL, CKMB, CKMBINDEX, TROPONINI in the last 168 hours. BNP (last 3 results)  Recent Labs  09/06/14 0411  PROBNP 43778.0*   CBG:  Recent Labs Lab 09/07/14 0341 09/07/14 0839 09/07/14 1130 09/07/14 1243 09/07/14 1552  GLUCAP 88 110* 181* 115* 150*    Recent Results (from the past 240 hour(s))  Stool culture     Status: None   Collection Time: 09/04/14  3:43 PM  Result Value Ref Range Status   Specimen Description STOOL  Final   Special Requests NONE  Final   Culture   Final    NO SALMONELLA, SHIGELLA, CAMPYLOBACTER, YERSINIA, OR E.COLI [id number]:[id number] ISOLATED Note: REDUCED NORMAL  FLORA PRESENT Performed at [facility]   Report Status 09/08/2014 FINAL  Final     Studies: Nm Myocar Multi W/spect W/wall Motion / Ef  09/12/2014   CLINICAL DATA:  History of cardiomyopathy, ventricular fibrillation, cardiac arrest, hypokalemia.  EXAM: MYOCARDIAL IMAGING WITH SPECT (REST AND PHARMACOLOGIC-STRESS)  GATED LEFT VENTRICULAR WALL MOTION STUDY  LEFT VENTRICULAR EJECTION FRACTION  TECHNIQUE: Standard myocardial SPECT imaging was performed after resting intravenous injection of 10 mCi Tc-14msestamibi. Subsequently, intravenous infusion of Lexiscan was performed under the supervision of the Cardiology staff. At peak effect of the drug, 30 mCi Tc-951mestamibi was injected intravenously and standard myocardial SPECT imaging was performed. Quantitative gated imaging was also  performed to evaluate left ventricular wall motion, and estimate left ventricular ejection fraction.  COMPARISON:  Chest radiograph - 09/05/2014  FINDINGS: Raw images: There is mild patient motion artifact on both the provided rest and stress images. GI attenuation is seen on both the provided rest and stress images.  Perfusion: There is a minimal amount of attenuation involving the inferior wall which improves on the provided stress images. No definitive scintigraphic evidence of pharmacologically induced ischemia.  Wall Motion: There is mild diffuse / global hypokinesia without discrete geographic wall abnormality.  Left Ventricular Ejection Fraction: 52 %  End diastolic volume 423 ml  End systolic volume 67 ml  IMPRESSION: 1. No scintigraphic evidence of prior infarction or pharmacologically induced ischemia on this motion degraded examination.  2. Mild diffuse / global hypokinesia.  3. Left ventricular ejection fraction 52%  4. Low-risk stress test findings*.  *2012 Appropriate Use Criteria for Coronary Revascularization Focused Update: J Am Coll Cardiol. 5361;44(3):154-008.  http://content.[email or web address].aspx?articleid=1201161   Electronically Signed   By: Sandi Mariscal M.D.   On: 09/12/2014 13:01    Scheduled Meds: . [MAR Hold] acidophilus  2 capsule Oral Daily  . [MAR Hold] antiseptic oral rinse  7 mL Mouth Rinse BID  .  ceFAZolin (ANCEF) IV  2 g Intravenous On Call  . chlorhexidine  60 mL Topical Once  . diazepam  10 mg Oral On Call  . [MAR Hold] famotidine  20 mg Oral QHS  . gentamicin irrigation  80 mg Irrigation On Call  . [MAR Hold] heparin subcutaneous  5,000 Units Subcutaneous 3 times per day  . [MAR Hold] mesalamine  500 mg Oral TID  . [MAR Hold] predniSONE  40 mg Oral Q breakfast  . [MAR Hold] spironolactone  12.5 mg Oral Daily   Continuous Infusions: . sodium chloride Stopped (09/13/14 0500)  . sodium chloride    . sodium chloride 50 mL/hr at 09/13/14 0500     [facility]  [facility] Pager : [email or web address], password [facility] 09/13/2014, 1:41 PM  LOS: 13 days

## 2014-09-13 NOTE — Progress Notes (Signed)
NUTRITION FOLLOW UP  Intervention:   Discontinue Magic Cup supplements Provide Ensure Complete BID PRN RD to continue to monitor for PO adequacy  Nutrition Dx:   Inadequate oral intake now related to cognition and dysphagia as evidenced by dysphagia diet; ongoing/improving  Goal:   Pt to meet >/= 90% of their estimated nutrition needs; not met.    Monitor:   Weight trend, PO intake, supplement acceptance  Assessment:   28 y/o female admitted 10/22 after collapse with subsequent cardiac arrest at home and prolonged CPR (prolonged down time).  Per MD notes Vfib arrest likely due to diarrhea with resulting hypokalemia. s/p hypothermia protocol.  Pt extubated 10/27. Seen by SLP  (10/28) and starting a PO diet.   RD attempted to meet with patient yesterday and today but, pt was out of room at both attempts. History obtained from chart review. Patient was advanced from Dysphagia 2 diet to Dysphagia 3 on 10/30 and then advanced again to [place] on 11/2. Patient was NPO yesterday morning for stress test and NPO again today for procedure.  Patient's weight had dropped to 163 lbs yesterday but, is back up to 168 lbs today. Per nursing notes pt ate 100% of her breakfast this morning and 80% of one meal yesterday but, from 10/28 to 11/2 patient was only eating 10-50% of meals. Per GI note, pt is tolerating solid diet. Patient had ICD placement today. RD will continue to monitor for PO adequacy.   Labs: low potassium   Height: Ht Readings from Last 1 Encounters:  09/07/14 5' 5.5" (1.664 m)    Weight Status:   Wt Readings from Last 1 Encounters:  09/13/14 168 lb 4.8 oz (76.34 kg)  09/12/14 163 lb Admission weight: 169 lb (77 kg) 10/22  Re-estimated needs:  Kcal: 1700-1900 Protein: 80-100 grams Fluid: >1.8 L  Skin: Intact; +1 LUE, RLE, and LLE edema  Diet Order: Diet NPO time specified Except for: Sips with Meds   Intake/Output Summary (Last 24 hours) at 09/13/14  1424 Last data filed at 09/13/14 1333  Gross per 24 hour  Intake 926.67 ml  Output    600 ml  Net 326.67 ml    Last BM: 11/3  Labs:   Recent Labs Lab 09/07/14 0400 09/08/14 0315 09/09/14 0638  09/11/14 0450 09/12/14 0525 09/13/14 0525  NA 146 142 144  < > 142 144 143  K 3.7 4.3 4.4  < > 3.5* 3.9 3.4*  CL 97 103 104  < > 106 110 109  CO2 33* 29 29  < > _0 BUN 24* 24* 23  < > _1 CREATININE 2.54* 2.20* 1.94*  < > 1.67* 1.51* 1.51*  CALCIUM 8.2* 8.8 9.0  < > 8.6 8.6 8.4  MG 1.9 2.5 2.2  --   --  1.6  --   PHOS 12.9* 2.9 4.5  --   --   --   --   GLUCOSE 87 96 87  < > 77 75 84  < > = values in this interval not displayed.  CBG (last 3)  No results for input(s): GLUCAP in the last 72 hours.  Scheduled Meds: . [MAR Hold] acidophilus  2 capsule Oral Daily  . [MAR Hold] antiseptic oral rinse  7 mL Mouth Rinse BID  . chlorhexidine  60 mL Topical Once  . diazepam  10 mg Oral On Call  . [MAR Hold] famotidine  20 mg Oral QHS  .  gentamicin irrigation  80 mg Irrigation On Call  . [MAR Hold] heparin subcutaneous  5,000 Units Subcutaneous 3 times per day  . [MAR Hold] mesalamine  500 mg Oral TID  . [MAR Hold] predniSONE  40 mg Oral Q breakfast  . [MAR Hold] spironolactone  12.5 mg Oral Daily    Continuous Infusions: . sodium chloride Stopped (09/13/14 0500)  . sodium chloride    . sodium chloride 50 mL/hr at 09/13/14 0500   Pryor Ochoa RD, LDN Inpatient Clinical Dietitian Pager: [phone] After Hours Pager: [phone]

## 2014-09-13 NOTE — Transfer of Care (Signed)
Immediate Anesthesia Transfer of Care Note  Patient: Julie Saunders  Procedure(s) Performed: Procedure(s): IMPLANTABLE CARDIOVERTER DEFIBRILLATOR IMPLANT (N/A)  Patient Location: PACU and Cath Lab  Anesthesia Type:MAC  Level of Consciousness: awake, alert  and patient cooperative  Airway & Oxygen Therapy: Patient Spontanous Breathing and Patient connected to nasal cannula oxygen  Post-op Assessment: Report given to PACU RN, Post -op Vital signs reviewed and stable and Patient moving all extremities  Post vital signs: Reviewed and stable  Complications: No apparent anesthesia complications

## 2014-09-13 NOTE — Plan of Care (Signed)
Problem: Consults Goal: ICD Patient Education (See Patient Education module for education specifics.) Outcome: Completed/Met Date Met:  09/13/14 Goal: Skin Care Protocol Initiated - if Braden Score 18 or less If consults are not indicated, leave blank or document N/A Outcome: Completed/Met Date Met:  09/13/14 Goal: Tobacco Cessation referral if indicated Outcome: Not Applicable Date Met:  01/02/35 Goal: Nutrition Consult-if indicated Outcome: Completed/Met Date Met:  09/13/14 Goal: Diabetes Guidelines if Diabetic/Glucose > 140 If diabetic or lab glucose is > 140 mg/dl - Initiate Diabetes/Hyperglycemia Guidelines & Document Interventions  Outcome: Not Applicable Date Met:  11/02/48  Problem: Phase I Progression Outcomes Goal: Pain controlled with appropriate interventions Outcome: Not Applicable Date Met:  75/30/05 Goal: OOB with assistance Outcome: Completed/Met Date Met:  09/13/14 Goal: Labs/tests per orders Outcome: Completed/Met Date Met:  09/13/14 Goal: Procedure Consent complete Outcome: Completed/Met Date Met:  09/13/14

## 2014-09-13 NOTE — Anesthesia Postprocedure Evaluation (Signed)
  Anesthesia Post-op Note  Patient: Julie Saunders  Procedure(s) Performed: Procedure(s): IMPLANTABLE CARDIOVERTER DEFIBRILLATOR IMPLANT (N/A)  Patient Location: PACU  Anesthesia Type:General  Level of Consciousness: awake  Airway and Oxygen Therapy: Patient Spontanous Breathing  Post-op Pain: mild  Post-op Assessment: Post-op Vital signs reviewed  Post-op Vital Signs: Reviewed  Last Vitals:  Filed Vitals:   09/13/14 1550  BP: 137/72  Pulse: 50  Temp:   Resp: 10    Complications: No apparent anesthesia complications

## 2014-09-13 NOTE — Progress Notes (Signed)
[facility] Progress Note  Subjective: Doing fairly well from GI standpoint but still with moderate painless diarrhea. Tolerating solid diet  Objective: Vital signs in last 24 hours: Temp:  [97.8 F (36.6 C)-98.5 F (36.9 C)] 98.5 F (36.9 C) (11/04 0553) Pulse Rate:  [44-60] 45 (11/04 0958) Resp:  [16-18] 16 (11/04 0958) BP: (117-139)/(54-67) 139/67 mmHg (11/04 0958) SpO2:  [98 %-100 %] 100 % (11/04 0958) Weight:  [76.34 kg (168 lb 4.8 oz)] 76.34 kg (168 lb 4.8 oz) (11/04 0553) Weight change: 2.14 kg (4 lb 11.5 oz)   PE:adb soft, nontender   Lab Results: Results for orders placed or performed during the hospital encounter of 08/31/14 (from the past 24 hour(s))  Protime-INR     Status: None   Collection Time: 09/13/14  5:05 AM  Result Value Ref Range   Prothrombin Time 13.6 11.6 - 15.2 seconds   INR 1.02 0.00 - 4.48  Basic metabolic panel     Status: Abnormal   Collection Time: 09/13/14  5:25 AM  Result Value Ref Range   Sodium 143 137 - 147 mEq/L   Potassium 3.4 (L) 3.7 - 5.3 mEq/L   Chloride 109 96 - 112 mEq/L   CO2 24 19 - 32 mEq/L   Glucose, Bld 84 70 - 99 mg/dL   BUN 12 6 - 23 mg/dL   Creatinine, Ser 1.51 (H) 0.50 - 1.10 mg/dL   Calcium 8.4 8.4 - 10.5 mg/dL   GFR calc non Af Amer 46 (L) >90 mL/min   GFR calc Af Amer 54 (L) >90 mL/min   Anion gap 10 5 - 15    Studies/Results: Nm Myocar Multi W/spect W/wall Motion / Ef  09/12/2014   CLINICAL DATA:  History of cardiomyopathy, ventricular fibrillation, cardiac arrest, hypokalemia.  EXAM: MYOCARDIAL IMAGING WITH SPECT (REST AND PHARMACOLOGIC-STRESS)  GATED LEFT VENTRICULAR WALL MOTION STUDY  LEFT VENTRICULAR EJECTION FRACTION  TECHNIQUE: Standard myocardial SPECT imaging was performed after resting intravenous injection of 10 mCi Tc-54m sestamibi. Subsequently, intravenous infusion of Lexiscan was performed under the supervision of the Cardiology staff. At peak effect of the drug, 30 mCi Tc-77m sestamibi was  injected intravenously and standard myocardial SPECT imaging was performed. Quantitative gated imaging was also performed to evaluate left ventricular wall motion, and estimate left ventricular ejection fraction.  COMPARISON:  Chest radiograph - 09/05/2014  FINDINGS: Raw images: There is mild patient motion artifact on both the provided rest and stress images. GI attenuation is seen on both the provided rest and stress images.  Perfusion: There is a minimal amount of attenuation involving the inferior wall which improves on the provided stress images. No definitive scintigraphic evidence of pharmacologically induced ischemia.  Wall Motion: There is mild diffuse / global hypokinesia without discrete geographic wall abnormality.  Left Ventricular Ejection Fraction: 52 %  End diastolic volume 185 ml  End systolic volume 67 ml  IMPRESSION: 1. No scintigraphic evidence of prior infarction or pharmacologically induced ischemia on this motion degraded examination.  2. Mild diffuse / global hypokinesia.  3. Left ventricular ejection fraction 52%  4. Low-risk stress test findings*.  *2012 Appropriate Use Criteria for Coronary Revascularization Focused Update: J Am Coll Cardiol. 6314;97(0):263-785. http://content.[email or web address].aspx?articleid=1201161   Electronically Signed   By: Sandi Mariscal M.D.   On: 09/12/2014 13:01      Assessment: Crohn's Disease Sp cardiac arrest  Plan: Further cardiacwu in progress Cont prednisone, pentasa    Gaius Ishaq C 09/13/2014, 11:52 AM

## 2014-09-13 NOTE — H&P (View-Only) (Signed)
Patient Name: Julie Saunders      SUBJECTIVE without complaint   familyu confused at mixed messages Myoview normal X   Mild decrease in EF  Past Medical History  Diagnosis Date  . Crohn disease   . Migraine   . Allergy   . Pancytopenia 11/23/2012    Scheduled Meds:  Scheduled Meds: . acidophilus  2 capsule Oral Daily  . antiseptic oral rinse  7 mL Mouth Rinse BID  .  ceFAZolin (ANCEF) IV  2 g Intravenous On Call  . chlorhexidine  60 mL Topical Once  . diazepam  10 mg Oral On Call  . famotidine  20 mg Oral QHS  . gentamicin irrigation  80 mg Irrigation On Call  . heparin subcutaneous  5,000 Units Subcutaneous 3 times per day  . mesalamine  500 mg Oral TID  . predniSONE  40 mg Oral Q breakfast   Continuous Infusions: . sodium chloride Stopped (09/13/14 0500)  . sodium chloride    . sodium chloride 50 mL/hr at 09/13/14 0500   sodium chloride, acetaminophen (TYLENOL) oral liquid 160 mg/5 mL, promethazine, RESOURCE THICKENUP CLEAR, sodium chloride    PHYSICAL EXAM Filed Vitals:   09/12/14 1349 09/12/14 2051 09/13/14 0553 09/13/14 0958  BP: 132/59 117/54 119/67 139/67  Pulse: 44 54 60 45  Temp: 97.8 F (36.6 C) 98.1 F (36.7 C) 98.5 F (36.9 C)   TempSrc: Oral Oral Oral   Resp: 18 18 18 16   Height:      Weight:   168 lb 4.8 oz (76.34 kg)   SpO2: 100% 100% 98% 100%   Well developed and nourished in no acute distress HENT normal Neck supple with JVP-flat Carotids brisk and full without bruits Clear Regular rate and rhythm, no murmurs or gallops Abd-soft with active BS without hepatomegaly No Clubbing cyanosis edema Skin-warm and dry A & Oriented  Grossly normal sensory and motor function   TELEMETRY: Reviewed telemetry pt in sinus   Intake/Output Summary (Last 24 hours) at 09/13/14 1258 Last data filed at 09/13/14 1020  Gross per 24 hour  Intake 926.67 ml  Output    600 ml  Net 326.67 ml    LABS: Basic Metabolic Panel:  Recent Labs Lab  09/07/14 0400 09/08/14 0315 09/09/14 0638 09/10/14 1310 09/11/14 0450 09/12/14 0525 09/13/14 0525  NA 146 142 144 142 142 144 143  K 3.7 4.3 4.4 3.6* 3.5* 3.9 3.4*  CL 97 103 104 103 106 110 109  CO2 33* 29 29 28 27 25 24   GLUCOSE 87 96 87 103* 77 75 84  BUN 24* 24* 23 21 17 14 12   CREATININE 2.54* 2.20* 1.94* 1.81* 1.67* 1.51* 1.51*  CALCIUM 8.2* 8.8 9.0 9.0 8.6 8.6 8.4  MG 1.9 2.5 2.2  --   --  1.6  --   PHOS 12.9* 2.9 4.5  --   --   --   --    Cardiac Enzymes: No results for input(s): CKTOTAL, CKMB, CKMBINDEX, TROPONINI in the last 72 hours. CBC:  Recent Labs Lab 09/07/14 0400 09/08/14 0315 09/09/14 0642  WBC 9.0 8.3 6.5  HGB 8.8* 9.3* 9.2*  HCT 25.7* 27.6* 27.6*  MCV 87.1 90.2 88.7  PLT 122* 189 231   PROTIME:  Recent Labs  09/13/14 0505  LABPROT 13.6  INR 1.02   Liver Function Tests: No results for input(s): AST, ALT, ALKPHOS, BILITOT, PROT, ALBUMIN in the last 72 hours. No results for  input(s): LIPASE, AMYLASE in the last 72 hours. BNP: BNP (last 3 results)  Recent Labs  09/06/14 0411  PROBNP 43778.0*   D-Dimer: No results for input(s): DDIMER in the last 72 hours. Hemoglobin A1C: No results for input(s): HGBA1C in the last 72 hours. Fasting Lipid Panel: No results for input(s): CHOL, HDL, LDLCALC, TRIG, CHOLHDL, LDLDIRECT in the last 72 hours. Thyroid Function Tests:  Recent Labs  09/11/14 1300  TSH 4.260       ASSESSMENT AND PLAN:  Principal Problem:   Cardiac arrest Active Problems:   Prolonged QT interval   Sinus bradycardia   Pancytopenia   Hypokalemia   Acute respiratory failure with hypoxia   Hypoxic ischemic encephalopathy (HIE)   Acute renal failure   Crohn's duodenitis   Anemia   Ventricular fibrillation   Cardiomyopathy, transient   a  Will need outpt gene testing  The pts situation is clarifying itself with underlying heart disease suggested by sinus node dysfunction, persistent QT prolongation, familyhx of  cardiac arrest LV dysfunction The Hypokalemia   may well be contributory but may also have been secondary to arrest.. The other factors prompt ICD for secondary prevention  Will begin aldactone for potassium sparing    Will proceed with ICD Have reviewed the potential benefits and risks of ICD implantation including but not limited to death, perforation of heart or lung, lead dislodgement, infection,  device malfunction and inappropriate shocks.  The patient and familyexpress understanding  and are willing to proceed.    3  Signed, Virl Axe MD  09/13/2014

## 2014-09-13 NOTE — Interval H&P Note (Signed)
ICD Criteria  Current LVEF:52% ;Obtained < 1 month ago.  NYHA Functional Classification: Class I  Heart Failure History:  No.  Non-Ischemic Dilated Cardiomyopathy History:  No.  Atrial Fibrillation/Atrial Flutter:  No.  Ventricular Tachycardia History:  Yes, Hemodynamic instability present, VT Type:  SVT - Polymorphic.  Cardiac Arrest History:  Yes, This was a Ventricular Tachycardia/Ventricular Fibrillation Arrest. This was NOT a bradycardia arrest.  History of Syndromes with Risk of Sudden Death:  Yes, Idiopathic/Primary VT/VF  Previous ICD:  No.  Electrophysiology Study: No.  Prior MI: No.  PPM: No.  OSA:  No  Patient Life Expectancy of >=1 year: Yes.  Anticoagulation Therapy:  Patient is NOT on anticoagulation therapy.   Beta Blocker Therapy:  No, Reason not on Beta Blocker therapy: brdycardia  Ace Inhibitor/ARB Therapy:  No, Reason not on Ace Inhibitor/ARB therapy:  not indicated with near normal LV function History and Physical Interval Note:  09/13/2014 1:03 PM  Julie Saunders  has presented today for surgery, with the diagnosis of A  The various methods of treatment have been discussed with the patient and family. After consideration of risks, benefits and other options for treatment, the patient has consented to  Procedure(s): IMPLANTABLE CARDIOVERTER DEFIBRILLATOR IMPLANT (N/A) as a surgical intervention .  The patient's history has been reviewed, patient examined, no change in status, stable for surgery.  I have reviewed the patient's chart and labs.  Questions were answered to the patient's satisfaction.     Virl Axe

## 2014-09-13 NOTE — Addendum Note (Signed)
Addendum  created 09/13/14 1759 by Izora Gala, CRNA   Modules edited: Anesthesia LDA, Lines/Drains/Airways Properties Editor   Lines/Drains/Airways Properties Editor:  Properties of line/drain/airway/wound Peripheral IV 09/13/14 Right;Anterior Forearm have been modified.

## 2014-09-14 ENCOUNTER — Inpatient Hospital Stay (HOSPITAL_COMMUNITY): Payer: 59

## 2014-09-14 DIAGNOSIS — I5022 Chronic systolic (congestive) heart failure: Secondary | ICD-10-CM

## 2014-09-14 LAB — BASIC METABOLIC PANEL
ANION GAP: 13 (ref 5–15)
BUN: 12 mg/dL (ref 6–23)
CHLORIDE: 109 meq/L (ref 96–112)
CO2: 20 mEq/L (ref 19–32)
Calcium: 8.5 mg/dL (ref 8.4–10.5)
Creatinine, Ser: 1.64 mg/dL — ABNORMAL HIGH (ref 0.50–1.10)
GFR calc Af Amer: 49 mL/min — ABNORMAL LOW (ref >90)
GFR calc non Af Amer: 42 mL/min — ABNORMAL LOW (ref >90)
GLUCOSE: 75 mg/dL (ref 70–99)
Potassium: 3.7 mEq/L (ref 3.7–5.3)
Sodium: 142 mEq/L (ref 137–147)

## 2014-09-14 MED ORDER — PREDNISONE 20 MG PO TABS: 20.0000 mg | ORAL_TABLET | Freq: Every day | ORAL | Status: DC

## 2014-09-14 MED ORDER — PROPRANOLOL HCL ER 80 MG PO CP24: 80.0000 mg | ORAL_CAPSULE | Freq: Every day | ORAL | Status: DC

## 2014-09-14 MED ORDER — SPIRONOLACTONE 12.5 MG HALF TABLET: 12.5000 mg | ORAL_TABLET | Freq: Every day | ORAL | Status: DC

## 2014-09-14 MED ORDER — HYDROCODONE-ACETAMINOPHEN 5-325 MG PO TABS: 1.0000 | ORAL_TABLET | ORAL | Status: DC | PRN

## 2014-09-14 MED ORDER — PROPRANOLOL HCL ER 80 MG PO CP24
80.0000 mg | ORAL_CAPSULE | Freq: Every day | ORAL | Status: DC
  Administered 2014-09-14: 80 mg via ORAL
  Filled 2014-09-14: qty 1

## 2014-09-14 MED ORDER — FAMOTIDINE 20 MG PO TABS: 20.0000 mg | ORAL_TABLET | Freq: Two times a day (BID) | ORAL | Status: DC

## 2014-09-14 MED ORDER — RISAQUAD PO CAPS: 2.0000 | ORAL_CAPSULE | Freq: Every day | ORAL | Status: DC

## 2014-09-14 MED ORDER — PREDNISONE 20 MG PO TABS
40.0000 mg | ORAL_TABLET | Freq: Every day | ORAL | Status: DC
  Filled 2014-09-14: qty 2

## 2014-09-14 NOTE — Progress Notes (Signed)
Occupational Therapy Treatment Patient Details Name: Julie Saunders MRN: [id number] DOB: Sep 11, 1986 Today's Date: Saunders    History of present illness Pt adm after cardiac arrest at home likely due to low K due to diarrhea. Pt with prolonged down time. Hypothermia protocol. Pt extubated 10/27. Pt with history of Crohns.ICD 09/13/14.    OT comments  Pt has made excellent progress with mobility, ADL and cognitive status. Lengthy conversation with family, PT and care team regarding pt's needs. Dad states that pt is "slow" cognitively at baseline and feels that she is close to her baseline. At this time feel pt can D/C home with home health OT/PT and 24/7 assistance. Dad states he can provide this level of care. Feel pt can transition into outpt services at the neuro outpatient clinic. Pt will benefit from a neuropsych evaluation. Discussed with CIR coordinator. Will defer further OT to home health as pt expected to D/C home today per MD.  Follow Up Recommendations  Home health OT;Supervision/Assistance - 24 hour    Equipment Recommendations  None recommended by OT (Pt has a 3 in 1 and tub seat)    Recommendations for Other Services Other (comment) (neuro psych evaluation on outpatient basis)    Precautions / Restrictions Precautions Precautions: Fall Restrictions Other Position/Activity Restrictions: no pushing pulling lifting greater than 10 lbs with LUE. No ROM greater than 90 degrees shoulder flexion       Mobility Bed Mobility Overal bed mobility: Needs Assistance Bed Mobility: Supine to Sit       Sit to supine: Supervision      Transfers Overall transfer level: Needs assistance Equipment used: 1 person hand held assist Transfers: Sit to/from Stand;Stand Pivot Transfers Sit to Stand: Min guard Stand pivot transfers: Min assist            Balance Overall balance assessment: Needs assistance   Sitting balance-Leahy Scale: Good     Standing balance support:  During functional activity Standing balance-Leahy Scale: Fair Standing balance comment: no LOB noted during ADL                   ADL Overall ADL's : Needs assistance/impaired Eating/Feeding: Independent   Grooming: Set up;Standing   Upper Body Bathing: Minimal assitance;Standing Upper Body Bathing Details (indicate cue type and reason): min A due to restrictions/pain from ICD Lower Body Bathing: Minimal assistance;Sit to/from stand   Upper Body Dressing : Minimal assistance;Sitting   Lower Body Dressing: Minimal assistance;Sit to/from stand   Toilet Transfer: Minimal Print production planner Details (indicate cue type and reason): HHA Toileting- Clothing Manipulation and Hygiene: Supervision/safety;Sit to/from stand       Functional mobility during ADLs: Minimal assistance (with HHA) General ADL Comments: Making excellent progress. Slow processing with ADL and distracted at times.       Vision                 Additional Comments: wears glasses   Perception     Praxis      Cognition   Behavior During Therapy: WFL for tasks assessed/performed Overall Cognitive Status: Impaired/Different from baseline Area of Impairment: Attention;Memory;Awareness;Problem solving   Current Attention Level: Selective Memory: Decreased short-term memory  Following Commands: Follows multi-step commands consistently Safety/Judgement: Decreased awareness of deficits Awareness: Emergent Problem Solving: Slow processing General Comments: Able to relay information  from earlier conversation with MD regarding precausiotns s/p ICD surgery    Extremity/Trunk Assessment  Exercises     Shoulder Instructions       General Comments      Pertinent Vitals/ Pain       Pain Assessment: 0-10 Pain Score: 4  Pain Location: L upper chest; surgical site Pain Descriptors / Indicators: Aching Pain Intervention(s): Limited activity within patient's  tolerance;Monitored during session  Home Living                                          Prior Functioning/Environment              Frequency Min 2X/week     Progress Toward Goals  OT Goals(current goals can now be found in the care plan section)  Progress towards OT goals: Goals met and updated - see care plan  Acute Rehab OT Goals Patient Stated Goal: Pt wants to go home. OT Goal Formulation: With patient Time For Goal Achievement: 09/21/14 Potential to Achieve Goals: Good ADL Goals Pt Will Perform Eating: with supervision;sitting Pt Will Perform Grooming: with supervision;sitting Pt Will Perform Upper Body Bathing: with min assist;sitting Pt Will Perform Upper Body Dressing: with min assist;sitting Pt Will Transfer to Toilet: with min assist;stand pivot transfer;bedside commode Additional ADL Goal #1: Pt will perform bed mobility with supervision as precursor to ADL. Additional ADL Goal #2: Pt will sit with supervision at EOB x 10 minutes while engaged in ADL.  Plan Discharge plan needs to be updated    Co-evaluation                 End of Session Equipment Utilized During Treatment: Gait belt   Activity Tolerance Patient tolerated treatment well   Patient Left in chair;with call bell/phone within reach;with family/visitor present   Nurse Communication Mobility status;Other (comment) (discharge situation)        Time: 7711-6579 OT Time Calculation (min): 42 min  Charges: OT General Charges $OT Visit: 1 Procedure OT Treatments $Self Care/Home Management : 38-52 mins  Julie Saunders,Julie Saunders, 10:45 AM   Julie Saunders, Julie Saunders  [phone] Saunders.

## 2014-09-14 NOTE — Discharge Summary (Signed)
Physician Discharge Summary  SARI COGAN QIW:[id number] DOB: 1986-02-20 DOA: 08/31/2014  PCP: Donnie Coffin, MD  Admit date: 08/31/2014 Discharge date: 09/14/2014  Time spent: >45 minutes  Recommendations for Outpatient Follow-up:  1. Will need to see GI in 2 wks 2. F/u with    Discharge Condition: stable Diet recommendation: heart healthy   Discharge Diagnoses:  Principal Problem:   Cardiac arrest Active Problems:   Cardiomyopathy   Chronic systolic heart failure   Pancytopenia   Hypokalemia   Acute respiratory failure with hypoxia   Hypoxic ischemic encephalopathy (HIE)   Acute renal failure   Crohn's duodenitis   Prolonged QT interval   Anemia   Ventricular fibrillation   Sinus bradycardia   History of present illness:  28 year old female patient with known history of Crohn's disease, and pancytopenia from Crohn's medication which has now resolved who apparently had been having a Crohn's exacerbation at home as manifested by profuse diarrhea for 3-4 days. On 10/22 patient collapsed at home. She was down at least 10-15 minutes before the fire department arrived and initiated CPR. 15 minutes later EMS arrived and initiated ACLS protocols with CPR for 15 more minutes prior to return of spontaneous circulation. She was defibrillated 4 times for V. Fib.  After arrival to the ER patient had altered mentation, tachycardia with episodes of PSVT, posturing and sluggish pupils on exam. Head and C-spine CTs were negative for acute processes. Chest x-ray demonstrated bilateral upper airspace disease worrisome for edema versus infiltrate. Labs revealed lwhite count 19,000, potassium 2.6, ammonia level 312, lactic acid 17.2 and anion gap of 32 and QT was prolonged. She has undergone 2 echocardiograms which revealed systolic dysfunction with an EF of 20-25%.  Hospital Course:  Acute respiratory failure with hypoxia in the setting of Cardiac arrest (VF) - Post arrest CM with EF 25% -  Required mech ventilation initially -- now stable on RA  - stress test 11.3.2015 negative for prior infarct or current ischemia - Initial CXR with bilateral lobe airspace disease, completed course of antibiotics in house.  Hypoxic ischemic encephalopathy (HIE): - having some issues with expressive aphasia.  - seems to be improving steadily.   Cardiomyopathy with EF of 25%//Prolonged QT interval  - cause of cardiomyopathy still not determined - - has undergone AICD placement on 11/4- stable for d/c today with f/u with Cardiology as outpatient - PRN Vicodin for pain control  Bradycardia - hemodynamically stable   Acute renal failure  - Baseline in Ct < 1.0. - was as high as 3.0- improved and now steady at 1.5- 1.6 now- GFR is 42- suspect this may be her new baseline  H/o Pancytopenia in the past - occurred last year- suspected to be a result of Azasan and resolved once it was discontinued  Crohn's duodenitis  - GI has evaluated her and recommended resuming Pentasa and starting Steroids-s he has been on 40 mg of Prednisone for the past 4 days  -- symptoms controlled on these medications.  - Dr Amedeo Plenty (GI) has recommended cutting Prednisone down to 20 mg which will start tomorrow- she is to see Dr Michail Sermon in 2 wks.    Chronic Anemia secondary to chronic disease/Crohn's disease  Baseline hemoglobin 12.3 to 13  - in hospital, has been running about 9-10- suspect drop is secondary to anemia of acute inflammation and possible GI blood loss from Crohn's.    Procedures: 10/16 Colonoscopy (Per [facility]) > active duodenitis with areas of villous blunting/atrophy in addition to  a small granuloma with multinucleated giant cells as seen in one of the fragments. Given the history of Crohn's disease, the changes are most compatible with involvement by the same disease process  10/22 CT head > neg  10/22 CT cervical spine >neg  10/22 ECHO > RV normal, LVEF 20-25%  10/23 LE Doppler  >neg  10/24 ct>Bowel wall thickening of terminal ileum and significant portions of particularly the distal colon compatible with enteritis, favor Crohn's disease in light of history.  11/3- Myoview stress test 11/4- AICD placment  Consultations: Cardiology/Dr. Fransico Him  EP/Dr. Virl Axe  Gastroenterology/Dr. Wilford Corner  CIR/Dr. Mitzi Hansen [facility]   Antibiotics:  Zosyn 10/22 >10/29  Vancomycin 10/24 >10/25  Micafungin x 1    Discharge Exam: Filed Weights   09/12/14 0506 09/13/14 0553 09/14/14 0448  Weight: 74.2 kg (163 lb 9.3 oz) 76.34 kg (168 lb 4.8 oz) 75.978 kg (167 lb 8 oz)   Filed Vitals:   09/14/14 0448  BP: 119/60  Pulse: 57  Temp: 97.9 F (36.6 C)  Resp: 18    General: AAO x 3, no distress Cardiovascular: RRR, no murmurs  Respiratory: clear to auscultation bilaterally GI: soft, non-tender, non-distended, bowel sound positive Skin: incision at AICD site is clean   Discharge Instructions You were cared for by a hospitalist during your hospital stay. If you have any questions about your discharge medications or the care you received while you were in the hospital after you are discharged, you can call the unit and asked to speak with the hospitalist on call if the hospitalist that took care of you is not available. Once you are discharged, your primary care physician will handle any further medical issues. Please note that NO REFILLS for any discharge medications will be authorized once you are discharged, as it is imperative that you return to your primary care physician (or establish a relationship with a primary care physician if you do not have one) for your aftercare needs so that they can reassess your need for medications and monitor your lab values.      Discharge Instructions    Diet - low sodium heart healthy    Complete by:  As directed      Increase activity slowly    Complete by:  As directed             Medication List     STOP taking these medications        acetaminophen 325 MG tablet  Commonly known as:  TYLENOL     amoxicillin-clavulanate 875-125 MG per tablet  Commonly known as:  AUGMENTIN     doxycycline 100 MG tablet  Commonly known as:  VIBRA-TABS      TAKE these medications        acidophilus Caps capsule  Take 2 capsules by mouth daily.     ALPRAZolam 0.25 MG tablet  Commonly known as:  XANAX  Take 0.25 mg by mouth 3 (three) times daily as needed for anxiety.     dimenhyDRINATE 50 MG tablet  Commonly known as:  DRAMAMINE  Take 50 mg by mouth daily as needed for dizziness.     famotidine 20 MG tablet  Commonly known as:  PEPCID  Take 1 tablet (20 mg total) by mouth 2 (two) times daily.     HYDROcodone-acetaminophen 5-325 MG per tablet  Commonly known as:  NORCO/VICODIN  Take 1 tablet by mouth every 4 (four) hours as needed for moderate pain.  mesalamine 250 MG CR capsule  Commonly known as:  PENTASA  Take 1,000 mg by mouth 4 (four) times daily.     predniSONE 20 MG tablet  Commonly known as:  DELTASONE  Take 1 tablet (20 mg total) by mouth daily with breakfast.  Start taking on:  09/15/2014     promethazine 25 MG tablet  Commonly known as:  PHENERGAN  Take 25 mg by mouth every 4 (four) hours as needed for nausea or vomiting.     propranolol ER 80 MG 24 hr capsule  Commonly known as:  INDERAL LA  Take 1 capsule (80 mg total) by mouth daily.     spironolactone 12.5 mg Tabs tablet  Commonly known as:  ALDACTONE  Take 0.5 tablets (12.5 mg total) by mouth daily.       Allergies  Allergen Reactions  . Sulfa Antibiotics    Follow-up Information    Follow up with [facility].   Contact information:   [address] [place] [address] [phone]       Follow up with Donnie Coffin, MD In 1 week.   Specialty:  Family Medicine   Contact information:   [address] [place] [address] [phone]       Follow up  with Lear Ng., MD In 2 weeks.   Specialty:  Gastroenterology   Why:  you will need to call and make and appointment   Contact information:   [address] [place] [address] [phone]       Follow up with Virl Axe, MD In 2 weeks.   Specialty:  Cardiology   Why:  if his office does not call you in 1-2 days, please call them to obtain and appointment   Contact information:   [address] [place] [address] [phone]        The results of significant diagnostics from this hospitalization (including imaging, microbiology, ancillary and laboratory) are listed below for reference.    Significant Diagnostic Studies: Dg Chest 2 View  09/14/2014   CLINICAL DATA:  Cardiac defibrillator.  EXAM: CHEST  2 VIEW  COMPARISON:  09/05/2014 chest radiograph.  FINDINGS: Support apparatus: There is a new dual lead LEFT subclavian AICD. Monitoring leads project over the chest.  Cardiomediastinal Silhouette:  Within normal limits.  Lungs: LEFT basilar atelectasis. Minimal RIGHT basilar atelectasis. No consolidation. No pneumothorax.  Effusions:  Small LEFT pleural effusion  Other:  None.  IMPRESSION: 1. Uncomplicated interval placement of LEFT subclavian AICD. 2. Small LEFT pleural effusion and LEFT bilateral basilar atelectasis.   Electronically Signed   By: Dereck Ligas M.D.   On: 09/14/2014 07:51   Ct Head Wo Contrast  08/31/2014   CLINICAL DATA:  28YOF was at home, family heard he collapse in the next room, went to find her and found her unresponsive. Fire department arrived started CPR (was down for about 10-15 mins before CPR was started). EMs arrived noted fine v-fib was defibrillated total of 4 times  EXAM: CT HEAD WITHOUT CONTRAST  CT CERVICAL SPINE WITHOUT CONTRAST  TECHNIQUE: Multidetector CT imaging of the head and cervical spine was performed following the standard protocol without intravenous contrast. Multiplanar CT image  reconstructions of the cervical spine were also generated.  COMPARISON:  None.  FINDINGS: CT HEAD FINDINGS  There is no evidence of mass effect, midline shift or extra-axial fluid collections. There is no evidence of a space-occupying lesion or  intracranial hemorrhage. There is no evidence of a cortical-based area of acute infarction.  The ventricles and sulci are appropriate for the patient's age. Incidental note is made of cavum septum pellucidum. The basal cisterns are patent.  Visualized portions of the orbits are unremarkable. The visualized portions of the paranasal sinuses and mastoid air cells are unremarkable.  The osseous structures are unremarkable.  CT CERVICAL SPINE FINDINGS  The alignment is anatomic. The vertebral body heights are maintained. There is no acute fracture. There is no static listhesis. The prevertebral soft tissues are normal. The intraspinal soft tissues are not fully imaged on this examination due to poor soft tissue contrast, but there is no gross soft tissue abnormality.  The disc spaces are maintained.  There are bilateral apical interstitial and alveolar airspace opacities most consistent with pulmonary edema given the patient's history.  IMPRESSION: 1. No acute intracranial pathology. 2. No acute osseous injury of the cervical spine. 3. Biapical interstitial and alveolar airspace opacities most concerning for pulmonary edema given the patient's history.   Electronically Signed   By: Kathreen Devoid   On: 08/31/2014 13:31   Ct Cervical Spine Wo Contrast  08/31/2014   CLINICAL DATA:  28YOF was at home, family heard he collapse in the next room, went to find her and found her unresponsive. Fire department arrived started CPR (was down for about 10-15 mins before CPR was started). EMs arrived noted fine v-fib was defibrillated total of 4 times  EXAM: CT HEAD WITHOUT CONTRAST  CT CERVICAL SPINE WITHOUT CONTRAST  TECHNIQUE: Multidetector CT imaging of the head and cervical spine was  performed following the standard protocol without intravenous contrast. Multiplanar CT image reconstructions of the cervical spine were also generated.  COMPARISON:  None.  FINDINGS: CT HEAD FINDINGS  There is no evidence of mass effect, midline shift or extra-axial fluid collections. There is no evidence of a space-occupying lesion or intracranial hemorrhage. There is no evidence of a cortical-based area of acute infarction.  The ventricles and sulci are appropriate for the patient's age. Incidental note is made of cavum septum pellucidum. The basal cisterns are patent.  Visualized portions of the orbits are unremarkable. The visualized portions of the paranasal sinuses and mastoid air cells are unremarkable.  The osseous structures are unremarkable.  CT CERVICAL SPINE FINDINGS  The alignment is anatomic. The vertebral body heights are maintained. There is no acute fracture. There is no static listhesis. The prevertebral soft tissues are normal. The intraspinal soft tissues are not fully imaged on this examination due to poor soft tissue contrast, but there is no gross soft tissue abnormality.  The disc spaces are maintained.  There are bilateral apical interstitial and alveolar airspace opacities most consistent with pulmonary edema given the patient's history.  IMPRESSION: 1. No acute intracranial pathology. 2. No acute osseous injury of the cervical spine. 3. Biapical interstitial and alveolar airspace opacities most concerning for pulmonary edema given the patient's history.   Electronically Signed   By: Kathreen Devoid   On: 08/31/2014 13:31   Ct Abdomen Pelvis W Contrast  09/02/2014   CLINICAL DATA:  Patient with Crohn's disease and acute sepsis, cardiac arrest, high suspicion of intra-abdominal abscess due to Crohn's and sepsis, impaired renal function  EXAM: CT ABDOMEN AND PELVIS WITH CONTRAST  TECHNIQUE: Multidetector CT imaging of the abdomen and pelvis was performed using the standard protocol following  bolus administration of intravenous contrast. Sagittal and coronal MPR images reconstructed from axial data set.  CONTRAST:  13m OMNIPAQUE IOHEXOL 300 MG/ML SOLN. Dilute oral contrast.  COMPARISON:  None  FINDINGS: BILATERAL pleural effusions and basilar volume loss with consolidation in both lower lobes.  Diffuse fatty infiltration of liver.  Scattered beam hardening artifacts from inclusion of patient's arms in imaged field.  Liver, spleen, pancreas, kidneys, and adrenal glands otherwise normal.  Diffuse wall thickening of the terminal ileum and multiple sites of the colon consistent with history of Crohn's disease.  Air and Foley catheter within decompressed urinary bladder.  Unremarkable uterus and adnexae for age.  Significant free fluid in abdomen and pelvis including perihepatic and perisplenic.  No definite free intraperitoneal air identified however.  Rectal tube and Foley catheter present.  No mass, adenopathy, or hernia.  No acute osseous findings.  Gallbladder appears distended without calcification.  Stomach and remaining bowel loops unremarkable.  Scattered subcutaneous edema.  IMPRESSION: Bowel wall thickening of terminal ileum and significant portions of particularly the distal colon compatible with enteritis, favor Crohn's disease in light of history.  Bibasilar effusions, atelectasis and suspected lower lobe consolidation.  Associated significant ascites but no discrete abscess collection or free air identified.  Scattered subcutaneous edema and soft tissue edema which may be due to hypoproteinemia, anasarca or fluid overload.   Electronically Signed   By: MLavonia DanaM.D.   On: 09/02/2014 14:38   Nm Myocar Multi W/spect W/wall Motion / Ef  09/12/2014   CLINICAL DATA:  History of cardiomyopathy, ventricular fibrillation, cardiac arrest, hypokalemia.  EXAM: MYOCARDIAL IMAGING WITH SPECT (REST AND PHARMACOLOGIC-STRESS)  GATED LEFT VENTRICULAR WALL MOTION STUDY  LEFT VENTRICULAR EJECTION FRACTION   TECHNIQUE: Standard myocardial SPECT imaging was performed after resting intravenous injection of 10 mCi [id number]. Subsequently, intravenous infusion of Lexiscan was performed under the supervision of the Cardiology staff. At peak effect of the drug, 30 mCi [id number] was injected intravenously and standard myocardial SPECT imaging was performed. Quantitative gated imaging was also performed to evaluate left ventricular wall motion, and estimate left ventricular ejection fraction.  COMPARISON:  Chest radiograph - 09/05/2014  FINDINGS: Raw images: There is mild patient motion artifact on both the provided rest and stress images. GI attenuation is seen on both the provided rest and stress images.  Perfusion: There is a minimal amount of attenuation involving the inferior wall which improves on the provided stress images. No definitive scintigraphic evidence of pharmacologically induced ischemia.  Wall Motion: There is mild diffuse / global hypokinesia without discrete geographic wall abnormality.  Left Ventricular Ejection Fraction: 52 %  End diastolic volume 139947  End systolic volume 67 ml  IMPRESSION: 1. No scintigraphic evidence of prior infarction or pharmacologically induced ischemia on this motion degraded examination.  2. Mild diffuse / global hypokinesia.  3. Left ventricular ejection fraction 52%  4. Low-risk stress test findings*.  *2012 Appropriate Use Criteria for Coronary Revascularization Focused Update: J Am Coll Cardiol. 2010962;83(6):629-476ttp://content.onlairportbarriers.compx?articleid=[email or web address]   Electronically Signed   By: JohSandi MariscalD.   On: 09/12/2014 13:01   Us [place] Port  09/05/2014   CLINICAL DATA:  Renal failure. History of Crohn's disease. Pancytopenia history.  EXAM: RENAL/URINARY TRACT ULTRASOUND COMPLETE  COMPARISON:  CT, 09/02/2014.  FINDINGS: Right Kidney:  Length: 12.4 cm. Echogenicity within normal limits. No mass or hydronephrosis visualized.  Left Kidney:   Length: 11.8 cm. Echogenicity within normal limits. No mass or hydronephrosis visualized.  Bladder:  Mildly distended.  Foley catheter in place, otherwise unremarkable.  Small left pleural effusion.  IMPRESSION: Normal renal ultrasound.  No hydronephrosis.   Electronically Signed   By: Lajean Manes M.D.   On: 09/05/2014 13:35   Dg Chest Port 1 View  09/05/2014   CLINICAL DATA:  Hypoxia  EXAM: PORTABLE CHEST - 1 VIEW  COMPARISON:  September 04, 2014  FINDINGS: Endotracheal tube tip is 6.5 cm above the carina. Central catheter tip is at the cavoatrial junction level. Nasogastric tube tip and side port are below the diaphragm. No pneumothorax. There is persistent left lower lobe consolidation. Right lung is clear. Heart is upper normal in size with pulmonary vascularity within normal limits. No adenopathy. No bone lesions.  IMPRESSION: Tube and catheter positions as described without pneumothorax. Persistent left lower lobe consolidation. No new opacity. No change in cardiac silhouette.   Electronically Signed   By: Lowella Grip M.D.   On: 09/05/2014 07:30   Dg Chest Port 1 View  09/04/2014   CLINICAL DATA:  Cardiac arrest.  Assess support apparatus.  EXAM: PORTABLE CHEST - 1 VIEW  COMPARISON:  09/03/2014.  FINDINGS: Support apparatus: Unchanged.  Cardiomediastinal Silhouette:  Unchanged.  Lungs: Slight improvement in bilateral airspace disease compatible with pulmonary edema. Some of the airspace disease has shifted. More prominent basilar atelectasis. No pneumothorax.  Effusions:  None.  Other: LEFT costophrenic angle excluded from view. Defibrillator pads remain present.  IMPRESSION: 1. Unchanged support apparatus. 2. Shifting airspace disease compatible with pulmonary edema.   Electronically Signed   By: Dereck Ligas M.D.   On: 09/04/2014 07:54   Dg Chest Port 1 View  09/03/2014   CLINICAL DATA:  28 year old female with acute respiratory failure.  EXAM: PORTABLE CHEST - 1 VIEW  COMPARISON:  Chest  x-ray 09/02/2014.  FINDINGS: An endotracheal tube is in place with tip 2.3 cm above the carina. There is a left-sided internal jugular central venous catheter with tip terminating in the right atrium approximately 1.5 cm distal to the superior cavoatrial junction. A nasogastric tube is seen extending into the stomach, however, the tip of the nasogastric tube extends below the lower margin of the image. Transcutaneous defibrillator pads project over the lower thorax and upper abdomen on the left. Lung volumes are low. Patchy bibasilar opacities are noted, compatible with a combination of atelectasis and/or consolidation. Small bilateral pleural effusions. There is a lucency and peripheral linear opacity in the lateral aspect of the right mid to lower lung, favored to represent a skin fold. Heart size is normal. The patient is rotated to the left on today's exam, resulting in distortion of the mediastinal contours and reduced diagnostic sensitivity and specificity for mediastinal pathology.  IMPRESSION: 1. Support apparatus, as above. 2. Persistent multifocal patchy opacities throughout the mid to lower lungs bilaterally, compatible with a combination of atelectasis and/or consolidation, with superimposed small bilateral pleural effusions. 3. New low vertically oriented lucency and opacity in the periphery of the right lung compatible with a skin fold artifact. This is present in a different configuration on the subsequent follow-up chest x-ray obtained at 6:27 a.m.   Electronically Signed   By: Vinnie Langton M.D.   On: 09/03/2014 07:21   Dg Chest Port 1 View  09/03/2014   CLINICAL DATA:  Adjustment of endotracheal tube.  EXAM: PORTABLE CHEST - 1 VIEW  COMPARISON:  09/03/2014 at 4:19 a.m.  FINDINGS: Endotracheal tube tip projects 1 cm above the carina. This is without significant change from the earlier exam.  Left internal jugular central venous line tip lies at the  caval atrial junction, stable. Nasogastric  tube passes below the diaphragm into the stomach.  Cardiac silhouette is normal in size. Normal mediastinal and hilar contours.  Lung base opacity, most evident on the left, may all be atelectasis. Pneumonia is possible. Hazy airspace opacity noted on the previous day's study, most evident in the right upper lobe, has mildly improved. This suggest improved pulmonary edema. No pneumothorax.  IMPRESSION: 1. Endotracheal tube is well positioned. Tip projects 1 cm above the carina. Remaining support apparatus is stable and well positioned. 2. Remaining support apparatus is also well positioned. 3. Left lung base opacity stable from the previous day's exam, likely atelectasis. Pneumonia is possible. Other areas of lung opacity that is likely due to asymmetric edema mildly improved.   Electronically Signed   By: Lajean Manes M.D.   On: 09/03/2014 07:10   Dg Chest Port 1 View  09/02/2014   CLINICAL DATA:  Acute respiratory failure.  Hypoxemia.  EXAM: PORTABLE CHEST - 1 VIEW  COMPARISON:  08/31/2014.  FINDINGS: Endotracheal tube tip now projects at the origin of the right mainstem bronchus, within the right margin of the carina. Recommend retracting 1-2 cm for more optimal positioning.  Nasogastric tube extends below the diaphragm into the stomach. Right internal jugular central venous line tip lies just above the caval atrial junction.  Right upper lobe airspace opacity is less dense than on the prior study. Left upper lobe airspace opacity seen previously has mostly resolved. There is also perihilar and lower lung zone airspace opacity, greatest at the left lung base, increased from the prior exam. Lung findings may reflect pulmonary edema with an altered pattern secondary to differences in patient positioning. This is favored. Multifocal pneumonia is felt less likely.  Cardiac silhouette is normal in size. No mediastinal or hilar masses. No pneumothorax.  IMPRESSION: 1. Endotracheal tube tip now lies at the origin  the right mainstem bronchus. It does not fully into the right mainstem bronchus. Recommend retracting 1-2 cm. Critical Value/emergent results were called by telephone at the time of interpretation on 09/02/2014 at 8:31 am to this patient's nurse, who verbally acknowledged these results. 2. Changing pattern of lung infiltrates as detailed above. This supports pulmonary edema as the etiology of the pulmonary infiltrates. Upper lung airspace opacities have improved, while there are new lower lung zone airspace opacities.   Electronically Signed   By: Lajean Manes M.D.   On: 09/02/2014 08:32   Dg Chest Port 1 View  08/31/2014   CLINICAL DATA:  Central line placement, endotracheal tube placement  EXAM: PORTABLE CHEST - 1 VIEW  COMPARISON:  08/31/2014  FINDINGS: Endotracheal tube with the tip 1.7 above the carina. Left jugular central venous catheter with the tip projecting over the cavoatrial junction. Nasogastric 2 projecting over the stomach.  Bilateral patchy upper lobe interstitial and alveolar airspace opacities. No pleural effusion or pneumothorax. Stable cardiomediastinal silhouette. Unremarkable osseous structures.  IMPRESSION: 1. Endotracheal tube with the tip 1.7 cm above the carina. 2. Left jugular central venous catheter with the tip projecting over the cavoatrial junction. No pneumothorax. 3. Bilateral upper lobe interstitial and alveolar airspace opacities which may be secondary to pulmonary edema versus pneumonia.   Electronically Signed   By: Kathreen Devoid   On: 08/31/2014 14:40   Dg Chest Portable 1 View  08/31/2014   CLINICAL DATA:  Cardiac arrest.  Endotracheal tube placement.  EXAM: PORTABLE CHEST - 1 VIEW  COMPARISON:  None.  FINDINGS: Endotracheal tube is in  place with tip 1.9 cm above the carina. NG tube courses into the stomach and below the inferior margin of the film. Bilateral perihilar and upper lobe airspace opacities are identified. Heart size is normal.  IMPRESSION: ET tube and NG  tube project in good position.  Bilateral mid and upper lobe airspace disease could be due to pneumonia or pulmonary edema.   Electronically Signed   By: Inge Rise M.D.   On: 08/31/2014 13:02   Dg Abd Portable 1v  09/01/2014   CLINICAL DATA:  Abdominal pain, history of pancytopenia and acute respiratory failure  EXAM: PORTABLE ABDOMEN - 1 VIEW  COMPARISON:  None.  FINDINGS: A single supine abdominal film reveals a relative paucity of bowel gas. There is some gas in the stomach. There is an esophagogastric tube present with the tip in the mid portion of the gastric body. External pacing-defibrillation leads are present. A tubular structure projects to the right of the upper lumbar spine and may reflect a drain or vascular catheter.  IMPRESSION: The bowel gas pattern is nonspecific with no evidence of obstruction or ileus.   Electronically Signed   By: David  Martinique   On: 09/01/2014 11:51    Microbiology: Recent Results (from the past 240 hour(s))  Stool culture     Status: None   Collection Time: 09/04/14  3:43 PM  Result Value Ref Range Status   Specimen Description STOOL  Final   Special Requests NONE  Final   Culture   Final    NO SALMONELLA, SHIGELLA, CAMPYLOBACTER, YERSINIA, OR E.COLI 0157:[id number] ISOLATED Note: REDUCED NORMAL FLORA PRESENT Performed at [facility]   Report Status 09/08/2014 FINAL  Final     Labs: Basic Metabolic Panel:  Recent Labs Lab 09/08/14 0315 09/09/14 0638 09/10/14 1310 09/11/14 0450 09/12/14 0525 09/13/14 0525 09/14/14 0515  NA 142 144 142 142 144 143 142  K 4.3 4.4 3.6* 3.5* 3.9 3.4* 3.7  CL 103 104 103 106 110 109 109  CO2 29 29 28 27 25 24 20   GLUCOSE 96 87 103* 77 75 84 75  BUN 24* 23 21 17 14 12 12   CREATININE 2.20* 1.94* 1.81* 1.67* 1.51* 1.51* 1.64*  CALCIUM 8.8 9.0 9.0 8.6 8.6 8.4 8.5  MG 2.5 2.2  --   --  1.6  --   --   PHOS 2.9 4.5  --   --   --   --   --    Liver Function Tests:  Recent Labs Lab 09/09/14 0638  AST 21   ALT 75*  ALKPHOS 64  BILITOT 0.3  PROT 5.7*  ALBUMIN 2.0*   No results for input(s): LIPASE, AMYLASE in the last 168 hours. No results for input(s): AMMONIA in the last 168 hours. CBC:  Recent Labs Lab 09/08/14 0315 09/09/14 0642  WBC 8.3 6.5  HGB 9.3* 9.2*  HCT 27.6* 27.6*  MCV 90.2 88.7  PLT 189 231   Cardiac Enzymes: No results for input(s): CKTOTAL, CKMB, CKMBINDEX, TROPONINI in the last 168 hours. BNP: BNP (last 3 results)  Recent Labs  09/06/14 0411  PROBNP 43778.0*   CBG:  Recent Labs Lab 09/07/14 1130 09/07/14 1243 09/07/14 1552  GLUCAP 181* 115* 150*       SignedDebbe Odea, MD [facility] 09/14/2014, 11:28 AM

## 2014-09-14 NOTE — Plan of Care (Signed)
Problem: SLP Cognition Goals Goal: Patient will demonstrate awareness during Patient will demonstrate awareness during functional ADL for improved safety  Outcome: Completed/Met Date Met:  09/14/14

## 2014-09-14 NOTE — Progress Notes (Signed)
ELECTROPHYSIOLOGY ROUNDING NOTE    Patient Name: Julie Saunders Date of Encounter: 09/14/2014    SUBJECTIVE:Patient feels well this morning.  Moderate incisional soreness.  No chest pain, shortness of breath, dizziness.   TELEMETRY: Reviewed telemetry pt in sinus rhythm, occasional ventricular pacing, occasional atrial pacing Filed Vitals:   09/13/14 1910 09/13/14 2108 09/14/14 0113 09/14/14 0448  BP: 116/74 122/81 120/67 119/60  Pulse: 77 55 56 57  Temp:  98 F (36.7 C) 98.6 F (37 C) 97.9 F (36.6 C)  TempSrc:  Oral Oral Oral  Resp:  18 18 18   Height:      Weight:    167 lb 8 oz (75.978 kg)  SpO2:  100% 100% 98%    Intake/Output Summary (Last 24 hours) at 09/14/14 0703 Last data filed at 09/13/14 2111  Gross per 24 hour  Intake   1360 ml  Output    700 ml  Net    660 ml    CURRENT MEDICATIONS: . acidophilus  2 capsule Oral Daily  . antiseptic oral rinse  7 mL Mouth Rinse BID  .  ceFAZolin (ANCEF) IV  1 g Intravenous Q6H  . famotidine  20 mg Oral QHS  . heparin subcutaneous  5,000 Units Subcutaneous 3 times per day  . mesalamine  500 mg Oral TID  . predniSONE  40 mg Oral Q breakfast  . propranolol ER  80 mg Oral Daily  . spironolactone  12.5 mg Oral Daily    LABS: Basic Metabolic Panel:  Recent Labs  09/12/14 0525 09/13/14 0525  NA 144 143  K 3.9 3.4*  CL 110 109  CO2 25 24  GLUCOSE 75 84  BUN 14 12  CREATININE 1.51* 1.51*  CALCIUM 8.6 8.4  MG 1.6  --    Thyroid Function Tests:  Recent Labs  09/11/14 1300  TSH 4.260    Radiology/Studies:  Dg Chest Portable 1 View 08/31/2014   CLINICAL DATA:  Cardiac arrest.  Endotracheal tube placement.  EXAM: PORTABLE CHEST - 1 VIEW  COMPARISON:  None.  FINDINGS: Endotracheal tube is in place with tip 1.9 cm above the carina. NG tube courses into the stomach and below the inferior margin of the film. Bilateral perihilar and upper lobe airspace opacities are identified. Heart size is normal.  IMPRESSION: ET  tube and NG tube project in good position.  Bilateral mid and upper lobe airspace disease could be due to pneumonia or pulmonary edema.   Electronically Signed   By: Inge Rise M.D.   On: 08/31/2014 13:02   Final result pending, leads in stable position.  PHYSICAL EXAM Well developed and nourished in no acute distress HENT normal Neck supple with JVP-flat Clear Wound clear Regular rate and rhythm, no murmurs or gallops Abd-soft with active BS No Clubbing cyanosis edema Skin-warm and dry A & Oriented  Grossly normal sensory and motor function   DEVICE INTERROGATION: Device interrogation pending  Principal Problem:   Cardiac arrest Active Problems:   Pancytopenia   Hypokalemia   Acute respiratory failure with hypoxia   Hypoxic ischemic encephalopathy (HIE)   Acute renal failure   Crohn's duodenitis   Prolonged QT interval   Anemia   Ventricular fibrillation   Sinus bradycardia   Cardiomyopathy, transient   S/p ICD for secondary prevention Continue aldactone Begin inderal  Will need outpt gene testing and GXT  Wound care, restrictions, shock plan reviewed with patient.  Routine follow-up scheduled. If going to rehab, please keep  incision clean and dry for 2 days; no lifting >10 pounds with left arm; no lifting arm over shoulder level for 1 week (reviewed with patient and father also).

## 2014-09-14 NOTE — Progress Notes (Signed)
Speech Language Pathology Treatment: Cognitive-Linquistic  Patient Details Name: Julie Saunders MRN: [id number] DOB: 1986-04-07 Today's Date: 09/14/2014 Time: 7011-0034 SLP Time Calculation (min): 20 min  Assessment / Plan / Recommendation Clinical Impression  Pt.'s pragmatics and cognition significantly improved from this therapists initial interaction/assessment with pt.  She required min verbal cues during discussion/education of memory/thought organization compensatory strategies.  SLP introduced/educated pt. re: a free Sport and exercise psychologist ([employer]). Exhibited adequate intellectual awareness stating she knows she is not at baseline status.  Goals met, however recommend continue cognitive tx in outpatient setting for high level/executive functioning abilities.     HPI HPI: 27 y/o female admitted 10/22 after VFib arrest at home and prolonged CPR (prolonged down time) when presenting to the ER.  Etiology felt to be hypokalemia from diarrhea.  To ICU for hypothermia protocol. No h/o swallowing difficulty reported.   Pertinent Vitals Pain Assessment: No/denies pain Pain Score: 4  Pain Location: L upper chest; surgical site Pain Descriptors / Indicators: Aching Pain Intervention(s): Limited activity within patient's tolerance;Monitored during session  SLP Plan  All goals met    Recommendations                Follow up Recommendations: Outpatient SLP Plan: All goals met    GO     Julie Saunders 09/14/2014, 1:14 PM   Julie Saunders.Ed [employer] [phone]

## 2014-09-14 NOTE — Plan of Care (Signed)
Problem: SLP Cognition Goals Goal: Patient will utilize external memory aids Patient will utilize external memory aids to facilitate recall of information for improved safety with  Outcome: Completed/Met Date Met:  09/14/14     

## 2014-09-14 NOTE — Progress Notes (Signed)
Discharge instructions given to pt and father Both verbalized instructions.Prescriptions also given to father

## 2014-09-14 NOTE — Plan of Care (Signed)
Problem: SLP Cognition Goals Goal: Patient will demonstrate problem solving skills Patient will demonstrate problem solving skills during functional ADL's with  Outcome: Completed/Met Date Met:  09/14/14

## 2014-09-14 NOTE — Progress Notes (Signed)
Rehab admissions - I have been following pt's case and been in communication with OT/PT and our rehab PA about pt's progress. Pt has made marked improvements in her overall mobility/self-care skills and cognitive tasks. PT/OT are now recommending that Home with home health be pursued. In addition, our rehab PA observed part of her therapy session and spoke with both pt/father. Per my conversation with Linna Hoff, Rehab PA, PA also feels that pt no longer requires the intensity of acute inpatient rehab.   We are recommending that pt have 24 hr family assist (provided by father/grandmother per father) and have follow up home health therapies and possibly transitioning to outpatient neuro at a later point.   I met with both pt/father and shared our recommendation for home with home health and they are in agreement and pleased that pt can go home.  I updated Crystal, case Freight forwarder and Butch Penny, social work. We will now sign off pt's case.  Please call me with any questions. Thanks.  Nanetta Batty, PT Rehabilitation Admissions Coordinator [phone]

## 2014-09-14 NOTE — Progress Notes (Signed)
Physical Therapy Treatment Patient Details Name: Julie Saunders MRN: [id number] DOB: 1986-11-03 Today's Date: 09/14/2014    History of Present Illness Pt adm after cardiac arrest at home initially due to low K due to diarrhea but then found to have prolonged QT. Pt with prolonged down time. Hypothermia protocol. Pt extubated 10/27. Pt with history of Crohns.ICD 09/13/14.     PT Comments    Pt has made excellent progress. Pt now able to go home with HHPT. Pt will be going to grandmother's house after dc. Recommend transition to OPPT and neuropsych eval after home health.  Follow Up Recommendations  Home health PT;Supervision/Assistance - 24 hour     Equipment Recommendations  None recommended by PT    Recommendations for Other Services       Precautions / Restrictions Precautions Precautions: Fall Restrictions Other Position/Activity Restrictions: no pushing pulling lifting greater than 10 lbs with LUE. No ROM greater than 90 degrees shoulder flexion    Mobility  Bed Mobility Overal bed mobility: Needs Assistance Bed Mobility: Supine to Sit       Sit to supine: Supervision      Transfers Overall transfer level: Needs assistance Equipment used: 1 person hand held assist Transfers: Sit to/from [facility] Sit to Stand: Min assist Stand pivot transfers: Min assist       General transfer comment: Assist to bring hips up from low chair due to not pushing with lt arm due to pain from ICD.  Ambulation/Gait Ambulation/Gait assistance: Supervision;Min guard Ambulation Distance (Feet): 230 Feet Assistive device: None Gait Pattern/deviations: Step-through pattern;Narrow base of support Gait velocity: decr Gait velocity interpretation: Below normal speed for age/gender General Gait Details: Pt able to amb without assistive device. Pt with guarded gait but became more relaxed as distance incr. Soreness from ICD and IV pole likely contributing to  this.   Stairs            Wheelchair Mobility    Modified Rankin (Stroke Patients Only)       Balance Overall balance assessment: Needs assistance   Sitting balance-Leahy Scale: Good     Standing balance support: During functional activity Standing balance-Leahy Scale: Good Standing balance comment: no LOB noted during ADL             High level balance activites: Direction changes;Turns High Level Balance Comments: Pt able to turn 360 degrees, reach down and pick up object from floor, stand with eyes closed, stand with feet together all with supervision and no loss of balance.    Cognition Arousal/Alertness: Awake/alert Behavior During Therapy: WFL for tasks assessed/performed Overall Cognitive Status: Impaired/Different from baseline Area of Impairment: Attention;Memory;Awareness;Problem solving   Current Attention Level: Selective Memory: Decreased short-term memory Following Commands: Follows multi-step commands consistently Safety/Judgement: Decreased awareness of deficits Awareness: Emergent Problem Solving: Slow processing General Comments: Able to relay information  from earlier conversation with MD regarding precausiotns s/p ICD surgery    Exercises      General Comments        Pertinent Vitals/Pain Pain Assessment: 0-10 Pain Score: 4  Pain Location: L upper chest; surgical site Pain Descriptors / Indicators: Aching Pain Intervention(s): Limited activity within patient's tolerance;Monitored during session    Home Living                      Prior Function            PT Goals (current goals can now be found in the  care plan section) Acute Rehab PT Goals Patient Stated Goal: Pt wants to go home. Progress towards PT goals: Progressing toward goals    Frequency       PT Plan Discharge plan needs to be updated    Co-evaluation             End of Session Equipment Utilized During Treatment: Gait belt Activity  Tolerance: Patient tolerated treatment well Patient left: in chair;with call bell/phone within reach;with family/visitor present     Time: 1020-1038 PT Time Calculation (min): 18 min  Charges:  $Gait Training: 8-22 mins                    G Codes:      Erroll Wilbourne 27-Sep-2014, 11:17 AM  Suanne Marker PT [phone]

## 2014-09-17 NOTE — Progress Notes (Signed)
09/14/14  Ok per MD for d/c today home with her g'mother and help of her father. Pt is very improved during this hospital stay- alert, oriented & ambulating well. No longer a candidate for CIR and refused SNF.  RNCM notified to arrange [facility]. CSW's father stated that he felt that they could manage well at home and that she no longer needed inpatient placement. Discussed with nursing and CSW signed off.  Lorie Phenix. Pauline Good, [place]

## 2014-09-19 LAB — ALDOSTERONE + RENIN ACTIVITY W/ RATIO
ALDO / PRA RATIO: 2.4 ratio (ref 0.9–28.9)
Aldosterone: 2 ng/dL
PRA LC/MS/MS: 0.83 ng/mL/h (ref 0.25–5.82)

## 2014-09-21 ENCOUNTER — Telehealth: Payer: Self-pay | Admitting: Internal Medicine

## 2014-09-21 NOTE — Telephone Encounter (Signed)
New message     Reporting to the nurse----pt has bradycardia.  Please advise

## 2014-09-21 NOTE — Telephone Encounter (Signed)
Pt's physical therapist called today to let the nurse know that pt has C/O of dizziness with moderate exertion for the last 2 days. Today when during PT, pt was carefull to do moderate activity, because she was afraid to get dizzy. Pt's heart rate was in the 80's with moderate activity, and at rest it runs 55 to 60 beats/minute. Pt denies SOB,or SOB. Charles Zimmer at device clinic will call pt.

## 2014-09-21 NOTE — Telephone Encounter (Signed)
LMOVM w/ my direct #. 

## 2014-09-22 NOTE — Telephone Encounter (Signed)
Called pt to let her know normal heart rate is 60-100bpm. Rate of 58bpm is also acceptable. Pt states rates were taken while sitting down; rates were not taken during exertion during PT. Let pt know I do not have access to programming details since her ICD home monitor has not yet transmitted. Pt understands all device diagnostics will be assessed at wound check appt on 10/04/14.

## 2014-09-22 NOTE — Telephone Encounter (Signed)
Follow up      Pt has had low heart rate all week-----today 62; on 11-11, 58; 11-12, 58.  Physical therapist said pt has been dizzy and fatigue.  Today's 20 was before she took any medications. Bp today is 114/70.  Please advise

## 2014-09-22 NOTE — Telephone Encounter (Signed)
i spoke with Julie Saunders Julie Saunders care nurse today and reminded her that this patient is on Inderal 80 mg LA daily, and she said she realized that. States her dizziness is only occasional and not constant. i told her we could not do anything different till Dr. Caryl Comes returned next week.

## 2014-09-22 NOTE — Telephone Encounter (Signed)
Attempted to call patient at home to see about how she is feeling, mainly her pulse rate. Got her voicemail. Left a message

## 2014-09-25 ENCOUNTER — Telehealth: Payer: Self-pay | Admitting: *Deleted

## 2014-09-25 ENCOUNTER — Encounter: Payer: 59 | Admitting: Physician Assistant

## 2014-09-25 ENCOUNTER — Telehealth: Payer: Self-pay | Admitting: Cardiology

## 2014-09-25 ENCOUNTER — Telehealth: Payer: Self-pay | Admitting: Internal Medicine

## 2014-09-25 NOTE — Telephone Encounter (Signed)
New message        Calling needing verbal orders to continue to treat pt for physical therapy. Please call back and advise.

## 2014-09-25 NOTE — Telephone Encounter (Signed)
LMOVM for pt to call me back directly.  Re: set up manual home monitor transmission for ICD

## 2014-09-25 NOTE — Telephone Encounter (Signed)
Pt is returning [place] call.

## 2014-09-25 NOTE — Telephone Encounter (Signed)
Jim from [facility] calling wanting a verbal order to continue with pt's PT.  States she still needs treatment.  Gave him verbal order to continue.  He was also asking if we had spoken to nurse regarding her dizziness.  Advised that our device clinic tech will call pt and manually set her up for [place] to receive her transmissions.  He will advise the [facility] so if she has another dizzy spell then we will receive the information.

## 2014-09-25 NOTE — Telephone Encounter (Signed)
lmtcb

## 2014-09-26 ENCOUNTER — Encounter: Payer: Self-pay | Admitting: Internal Medicine

## 2014-09-26 ENCOUNTER — Telehealth: Payer: Self-pay | Admitting: Internal Medicine

## 2014-09-26 NOTE — Telephone Encounter (Signed)
Instructed pt how to set up home monitor and how to send manual transmission.

## 2014-09-26 NOTE — Telephone Encounter (Signed)
New message           Pt needs help setting up her monitor

## 2014-09-27 ENCOUNTER — Telehealth: Payer: Self-pay | Admitting: Cardiology

## 2014-09-27 NOTE — Telephone Encounter (Signed)
Nurse with [facility] called and requested results from home transmission on 09-25-14. After consulting with device tech I informed home nurse that the transmission was normal with no episodes. She verbalized understanding.

## 2014-09-27 NOTE — Telephone Encounter (Signed)
New Msg    Julie Saunders returning call [phone]

## 2014-09-27 NOTE — Telephone Encounter (Signed)
Remote received. No episodes or alerts. All diagnostics normal. Pt's home care nurse took orthostatics but found no abnormalites. ROV 10/04/14 for treadmill test & wound check.

## 2014-09-27 NOTE — Telephone Encounter (Signed)
New message     Home health nurse calling     Patient C/O dizzy, naueas, heart rate continue to run low.    Vital sign today  112/74, 62, 18, 99 % 98.2

## 2014-09-27 NOTE — Telephone Encounter (Signed)
Spoke with Urban Gibson w/ [facility]. Advised to have patient stop Propranolol until wound check next week, per Dr. Caryl Comes. Can reevaluate symptoms then.  Urban Gibson will call patient and inform her of recommendations.

## 2014-09-27 NOTE — Telephone Encounter (Signed)
LMOVM for Julie Saunders to call my direct #.

## 2014-10-01 ENCOUNTER — Encounter (HOSPITAL_COMMUNITY): Payer: Self-pay | Admitting: *Deleted

## 2014-10-03 ENCOUNTER — Telehealth: Payer: Self-pay | Admitting: Internal Medicine

## 2014-10-03 NOTE — Telephone Encounter (Signed)
Checking with patient about her current status. Patient is scheduled for GXT tomorrow. Patient states she is feeling better, has steady gait, denies dizziness/syncope. Informed her we would see her tomorrow in office for wound check and stress test. Patient verbalized understanding and agreeable to plan.

## 2014-10-03 NOTE — Telephone Encounter (Signed)
lmtcb  [facility] recommending cardiac rehab.  Reviewed with Dr. Caryl Comes who states this is ok.)

## 2014-10-03 NOTE — Telephone Encounter (Signed)
New Msg   Patient returning call please contact at [phone]

## 2014-10-03 NOTE — Telephone Encounter (Signed)
New Message        Calling needing verbal order for continued visits for pt with [facility]. Please call back and advise.

## 2014-10-03 NOTE — Telephone Encounter (Signed)
Advised ok for 1-2 more home health RN/PT.

## 2014-10-03 NOTE — Telephone Encounter (Signed)
New message     Bp seated 150/96; standing 120/94; no reports of dizziness.  Patient reports multiple errands run on 11-23 with no neg effects.  Physical therapist recommend discharge from home health and referral to cardiac rehab.  Pt has appt on 11-25 with our office.  Pt continues to report intermittiant dizziness with orthostatic changes and quick head turns---PCP reports this is within expectant limits during recovery.

## 2014-10-04 ENCOUNTER — Other Ambulatory Visit: Payer: Self-pay | Admitting: *Deleted

## 2014-10-04 ENCOUNTER — Ambulatory Visit (INDEPENDENT_AMBULATORY_CARE_PROVIDER_SITE_OTHER): Payer: 59 | Admitting: Internal Medicine

## 2014-10-04 ENCOUNTER — Encounter: Payer: Self-pay | Admitting: Internal Medicine

## 2014-10-04 ENCOUNTER — Ambulatory Visit (INDEPENDENT_AMBULATORY_CARE_PROVIDER_SITE_OTHER): Payer: 59 | Admitting: *Deleted

## 2014-10-04 DIAGNOSIS — I472 Ventricular tachycardia, unspecified: Secondary | ICD-10-CM

## 2014-10-04 DIAGNOSIS — I429 Cardiomyopathy, unspecified: Secondary | ICD-10-CM

## 2014-10-04 DIAGNOSIS — I5022 Chronic systolic (congestive) heart failure: Secondary | ICD-10-CM

## 2014-10-04 DIAGNOSIS — R9431 Abnormal electrocardiogram [ECG] [EKG]: Secondary | ICD-10-CM

## 2014-10-04 DIAGNOSIS — I4581 Long QT syndrome: Secondary | ICD-10-CM

## 2014-10-04 LAB — MDC_IDC_ENUM_SESS_TYPE_INCLINIC
Battery Remaining Longevity: 93.6 mo
Brady Statistic RA Percent Paced: 28 %
Brady Statistic RV Percent Paced: 0.23 %
Date Time Interrogation Session: 20151125115330
HIGH POWER IMPEDANCE MEASURED VALUE: 61.875
Lead Channel Impedance Value: 312.5 Ohm
Lead Channel Impedance Value: 437.5 Ohm
Lead Channel Pacing Threshold Amplitude: 0.5 V
Lead Channel Pacing Threshold Amplitude: 0.5 V
Lead Channel Pacing Threshold Amplitude: 0.75 V
Lead Channel Pacing Threshold Pulse Width: 0.4 ms
Lead Channel Pacing Threshold Pulse Width: 0.4 ms
Lead Channel Sensing Intrinsic Amplitude: 8.1 mV
Lead Channel Setting Pacing Amplitude: 3.5 V
Lead Channel Setting Pacing Amplitude: 3.5 V
Lead Channel Setting Pacing Pulse Width: 0.4 ms
Lead Channel Setting Sensing Sensitivity: 0.5 mV
MDC IDC MSMT LEADCHNL RA SENSING INTR AMPL: 4 mV
MDC IDC MSMT LEADCHNL RV PACING THRESHOLD AMPLITUDE: 0.75 V
MDC IDC MSMT LEADCHNL RV PACING THRESHOLD PULSEWIDTH: 0.4 ms
MDC IDC MSMT LEADCHNL RV PACING THRESHOLD PULSEWIDTH: 0.4 ms
MDC IDC PG SERIAL: 7179320
MDC IDC SET ZONE DETECTION INTERVAL: 270 ms

## 2014-10-04 NOTE — Progress Notes (Signed)
Wound check appointment. Steri-strips removed. Wound without redness or edema. Incision edges approximated, wound well healed. Normal device function. Thresholds, sensing, and impedances consistent with implant measurements. Device programmed at 3.5V for extra safety margin until 3 month visit. Histogram distribution appropriate for patient and level of activity. PMT noted.  V-A conduction @ 295msec.  PVARP reprogrammed 329mec.  No mode switches or ventricular arrhythmias noted. Patient educated about wound care, arm mobility, lifting restrictions, shock plan. ROV in 3 months with implanting physician.

## 2014-10-04 NOTE — Progress Notes (Signed)
Exercise Treadmill Test  Pre-Exercise Testing Evaluation Rhythm: normal sinus  Rate: 65 bpm     Test  Exercise Tolerance Test Ordering MD: Virl Axe, MD  Interpreting MD: Virl Axe, MD  Unique Test No: 1  Treadmill:  1  Indication for ETT: aborted cardeic arrest  Contraindication to ETT: No   Stress Modality: exercise - treadmill  Cardiac Imaging Performed: non   Protocol: standard Bruce - maximal  Max BP:  160/91  91 85% MPR (bpm):  150  MPHR obtained (bpm):  164 % MPHR obtained:  77%  Reached 85% MPHR (min:sec):  n/a Total Exercise Time (min-sec):  252  Workload in METS:  5.5 Borg Scale:   Reason ETT Terminated:  HR target achieved    ST Segment Analysis At Rest:  With Exercise:   Other Information Arrhythmia:   Angina during ETT:   Quality of ETT:    ETT Interpretation:    Comments:  no inducible ventricular arrhythmia  Recommendations:  conintue current therapy

## 2014-10-09 ENCOUNTER — Encounter: Payer: Self-pay | Admitting: Physician Assistant

## 2014-10-09 ENCOUNTER — Ambulatory Visit (INDEPENDENT_AMBULATORY_CARE_PROVIDER_SITE_OTHER): Payer: 59 | Admitting: Physician Assistant

## 2014-10-09 VITALS — BP 124/62 | HR 77 | Ht 65.0 in | Wt 152.0 lb

## 2014-10-09 DIAGNOSIS — N289 Disorder of kidney and ureter, unspecified: Secondary | ICD-10-CM

## 2014-10-09 DIAGNOSIS — I429 Cardiomyopathy, unspecified: Secondary | ICD-10-CM

## 2014-10-09 DIAGNOSIS — N179 Acute kidney failure, unspecified: Secondary | ICD-10-CM

## 2014-10-09 DIAGNOSIS — I5022 Chronic systolic (congestive) heart failure: Secondary | ICD-10-CM

## 2014-10-09 DIAGNOSIS — E876 Hypokalemia: Secondary | ICD-10-CM

## 2014-10-09 LAB — BASIC METABOLIC PANEL
BUN: 13 mg/dL (ref 6–23)
CALCIUM: 9 mg/dL (ref 8.4–10.5)
CHLORIDE: 103 meq/L (ref 96–112)
CO2: 25 mEq/L (ref 19–32)
Creatinine, Ser: 1.3 mg/dL — ABNORMAL HIGH (ref 0.4–1.2)
GFR: 53.91 mL/min — ABNORMAL LOW (ref >60.00)
Glucose, Bld: 67 mg/dL — ABNORMAL LOW (ref 70–99)
Potassium: 3.1 mEq/L — ABNORMAL LOW (ref 3.5–5.1)
SODIUM: 136 meq/L (ref 135–145)

## 2014-10-09 NOTE — Progress Notes (Signed)
HPI: This is a 28 year old patient of Dr. Jolyn Nap who had V. fib arrest complicated by anoxic brain injury in the setting of hypokalemia and Prolonged QT interval, after 4 days of profuse diarrhea in the setting of Crohn's disease. It is unclear if this was the precipitating factor for her rest or consequence. She has a history of recurrent hypokalemia. She also had transient cardiomyopathy with initial EF 30-35% improved to 60% suggesting that her low EF was related to the rest itself rather than proceeding it. She underwent EP study by Dr. Caryl Comes and then a defibrillator was placed 09/13/14. She also had paroxysmal sinus bradycardia posterior rest etiology unclear. Because of her acute kidney injury(Crt 3.0-1.5) secondary to the arrest she could not undergo cardiac catheterization. Stress Myoview on 09/12/14 showed no evidence of prior infarct or ischemia, mild diffuse global hypokinesis, EF 52%. She had Respiratory failure, hypoxic encephalopathy with some aphasia.  Patient comes in today feeling well. She is working with therapy at home and progressing nicely she is able to run errands and do some walking. She had many questions that I spent time answering. She had blood work last week by Dr. Alroy Dust. She denies any chest pain, palpitations, dyspnea, and her defibrillator has not gone off. She was having some dizziness and bradycardia and her propanolol was stopped by Dr. Caryl Comes. The dizziness has improved. She occasionally will get dizzy with quick movements or standing up but it has greatly improved. She gained some weight over Thanksgiving but thinks it's due to overeating. She has had no edema or dyspnea. She has had some discomfort at defibrillator site when laying on her left side or certain movements.  Allergies  Allergen Reactions  . Sulfa Antibiotics      Current Outpatient Prescriptions  Medication Sig Dispense Refill  . acidophilus (RISAQUAD) CAPS capsule Take 2 capsules  by mouth daily. 60 capsule 3  . ALPRAZolam (XANAX) 0.25 MG tablet Take 0.25 mg by mouth 3 (three) times daily as needed for anxiety.    . dimenhyDRINATE (DRAMAMINE) 50 MG tablet Take 50 mg by mouth daily as needed for dizziness.    . famotidine (PEPCID) 20 MG tablet Take 1 tablet (20 mg total) by mouth 2 (two) times daily. 60 tablet 0  . HYDROcodone-acetaminophen (NORCO/VICODIN) 5-325 MG per tablet Take 1 tablet by mouth every 4 (four) hours as needed for moderate pain. 15 tablet 0  . mesalamine (PENTASA) 250 MG CR capsule Take 1,000 mg by mouth 4 (four) times daily.    . predniSONE (DELTASONE) 20 MG tablet Take 1 tablet (20 mg total) by mouth daily with breakfast. 30 tablet 0  . promethazine (PHENERGAN) 25 MG tablet Take 25 mg by mouth every 4 (four) hours as needed for nausea or vomiting.    . propranolol ER (INDERAL LA) 80 MG 24 hr capsule Take 1 capsule (80 mg total) by mouth daily. (Patient not taking: Reported on 10/04/2014) 30 capsule 0  . spironolactone (ALDACTONE) 12.5 mg TABS tablet Take 0.5 tablets (12.5 mg total) by mouth daily. 30 tablet 3   No current facility-administered medications for this visit.    Past Medical History  Diagnosis Date  . Crohn disease   . Migraine   . Allergy   . Pancytopenia 11/23/2012  . Cardiac arrest   . Bradycardia     Past Surgical History  Procedure Laterality Date  . Implantable cardioverter defibrillator implant  09/12/2014    STJ dual chamber ICD  implanted by Dr Caryl Comes for aborted cardiac arrest    Family History  Problem Relation Age of Onset  . Heart attack Mother     History   Social History  . Marital Status: Single    Spouse Name: N/A    Number of Children: N/A  . Years of Education: N/A   Occupational History  . Not on file.   Social History Main Topics  . Smoking status: Never Smoker   . Smokeless tobacco: Not on file  . Alcohol Use: 0.5 oz/week    1 drink(s) per week  . Drug Use: No  . Sexual Activity: Not on file     Other Topics Concern  . Not on file   Social History Narrative    ROS: See history of present illness otherwise negative  BP 124/62 mmHg  Pulse 77  Ht 5' 5"  (1.651 m)  Wt 152 lb (68.947 kg)  BMI 25.29 kg/m2  PHYSICAL EXAM: Well-nournished, in no acute distress. Neck: No JVD, HJR, Bruit, or thyroid enlargement  Lungs: No tachypnea, clear without wheezing, rales, or rhonchi  Cardiovascular: Defibrillator site healing well without hematoma or hemorrhage, RRR, PMI not displaced, Normal S1 and S2, 2/6 systolic murmur at the left sternal border, no gallops, bruit, thrill, or heave.  Abdomen: BS normal. Soft without organomegaly, masses, lesions or tenderness.  Extremities: without cyanosis, clubbing or edema. Good distal pulses bilateral  SKin: Warm, no lesions or rashes   Musculoskeletal: No deformities  Neuro: no focal signs   Wt Readings from Last 3 Encounters:  09/14/14 167 lb 8 oz (75.978 kg)  02/24/13 199 lb 3.2 oz (90.357 kg)  11/23/12 189 lb 15.4 oz (86.165 kg)    Lab Results  Component Value Date   WBC 6.5 09/09/2014   HGB 9.2* 09/09/2014   HCT 27.6* 09/09/2014   PLT 231 09/09/2014   GLUCOSE 75 09/14/2014   ALT 75* 09/09/2014   AST 21 09/09/2014   NA 142 09/14/2014   K 3.7 09/14/2014   CL 109 09/14/2014   CREATININE 1.64* 09/14/2014   BUN 12 09/14/2014   CO2 20 09/14/2014   TSH 4.260 09/11/2014   INR 1.02 09/13/2014    EKG: Normal sinus rhythm with LVH and ST-T wave abnormality inferior and anterolateral, unchanged from hospital tracing.  Stress Myoview 09/12/14: IMPRESSION: 1. No scintigraphic evidence of prior infarction or pharmacologically induced ischemia on this motion degraded examination.   2. Mild diffuse / global hypokinesia.   3. Left ventricular ejection fraction 52%   4. Low-risk stress test findings*.   2012 Appropriate Use Criteria for Coronary Revascularization Focused Update: J Am Coll Cardiol.  8119;14(7):829-562. http://content.airportbarriers.com.aspx?articleid=1201161     Electronically Signed   By: Sandi Mariscal M.D.   On: 09/12/2014 13:01   2-D echo 09/08/14: Study Conclusions  - Left ventricle: The cavity size was normal. Wall thickness was   normal. The estimated ejection fraction was 60%. Wall motion was   normal; there were no regional wall motion abnormalities. - Right ventricle: The cavity size was normal. Systolic function   was normal. - Impressions: MARKED IMPROVEMENT IN LV FUNCTION SINCE THE STUDY OF   09/04/2014.  Impressions:  - MARKED IMPROVEMENT IN LV FUNCTION SINCE THE STUDY OF 09/04/2014.  2-D echo 09/04/14:  Study Conclusions  - Left ventricle: The cavity size was mildly dilated. Wall   thickness was normal. Systolic function was severely reduced. The   estimated ejection fraction was in the range of 25% to 30%.  Wall   motion was normal; there were no regional wall motion   abnormalities. Left ventricular diastolic function parameters   were normal. - Aortic valve: There was trivial regurgitation. - Mitral valve: There was mild regurgitation. - Left atrium: The atrium was mildly dilated.  Impressions:  - Severe global reduction in LV function (worse towards the apex);   probable catheter in RA.

## 2014-10-09 NOTE — Assessment & Plan Note (Signed)
Patient underwent defibrillator for VF arrest in the setting of Hypokalemia. Will check BMET today. Patient is doing well. Start cardiac rehab. F/U with Dr. Caryl Comes in 1 month.

## 2014-10-09 NOTE — Assessment & Plan Note (Signed)
Most recent 2Decho 09/08/14 EF 60%. Stress myoview without ischemia or infarct on 09/12/14. No evidence of heart failure.

## 2014-10-09 NOTE — Assessment & Plan Note (Signed)
Cath not done because of this. Repeat BMET today.

## 2014-10-09 NOTE — Patient Instructions (Signed)
Your physician recommends that you continue on your current medications as directed. Please refer to the Current Medication list given to you today.   Your physician recommends that you return for lab work in:  [place] physician recommends that you schedule a follow-up appointment in:  [place]

## 2014-10-09 NOTE — Assessment & Plan Note (Signed)
Check potassium today

## 2014-10-10 ENCOUNTER — Telehealth: Payer: Self-pay

## 2014-10-10 DIAGNOSIS — E876 Hypokalemia: Secondary | ICD-10-CM

## 2014-10-10 MED ORDER — POTASSIUM CHLORIDE CRYS ER 20 MEQ PO TBCR
EXTENDED_RELEASE_TABLET | ORAL | Status: DC
Start: 2014-10-10 — End: 2014-11-26

## 2014-10-10 NOTE — Telephone Encounter (Signed)
-----   Message from Imogene Burn, PA-C sent at 10/09/2014  4:04 PM EST ----- Potassium low again. Kdur 20 meq BID for 3 days then once daily. Repeat bmet next week

## 2014-10-10 NOTE — Telephone Encounter (Signed)
Informed patient of low potassium, and to take Potassium 28meq BID for 3 days, then once daily. Patient put on schedule for labs on 10/17/14 for repeat BMET. Patient verbalized understanding. Patient had no further questions.

## 2014-10-10 NOTE — Telephone Encounter (Signed)
Informed patient that prescription sent to her pharmacy of her choice. Patient wants to have labs drawn on 10/16/14 instead of 10/17/14. Scheduled labs for 10/16/14.

## 2014-10-16 ENCOUNTER — Other Ambulatory Visit (INDEPENDENT_AMBULATORY_CARE_PROVIDER_SITE_OTHER): Payer: 59

## 2014-10-16 DIAGNOSIS — E876 Hypokalemia: Secondary | ICD-10-CM

## 2014-10-16 LAB — BASIC METABOLIC PANEL
BUN: 16 mg/dL (ref 6–23)
CO2: 27 mEq/L (ref 19–32)
Calcium: 9.1 mg/dL (ref 8.4–10.5)
Chloride: 103 mEq/L (ref 96–112)
Creatinine, Ser: 1.2 mg/dL (ref 0.4–1.2)
GFR: 57.03 mL/min — ABNORMAL LOW (ref >60.00)
Glucose, Bld: 82 mg/dL (ref 70–99)
POTASSIUM: 3.8 meq/L (ref 3.5–5.1)
SODIUM: 136 meq/L (ref 135–145)

## 2014-10-18 NOTE — Telephone Encounter (Signed)
Order faxed to cardiac rehab

## 2014-10-19 ENCOUNTER — Encounter (HOSPITAL_COMMUNITY): Payer: Self-pay | Admitting: Internal Medicine

## 2014-10-24 ENCOUNTER — Telehealth (HOSPITAL_COMMUNITY): Payer: Self-pay | Admitting: Cardiac Rehabilitation

## 2014-10-24 NOTE — Telephone Encounter (Signed)
pc to pt to discuss enrolling in cardiac rehab. Referral received from Dr. Caryl Comes. Pt undecided.  Brochure mailed to pt home address.   Will contact pt at later time to reassess readiness to participate.

## 2014-10-30 ENCOUNTER — Telehealth: Payer: Self-pay | Admitting: Internal Medicine

## 2014-10-30 NOTE — Telephone Encounter (Signed)
Informed patient lab work came back normal. Patient verbalized understanding.

## 2014-10-30 NOTE — Telephone Encounter (Signed)
New Message        Patient is calling because she had an appt on 10/18/14 with lab and she has been waiting on her lab results and no one has called her. Please give patient a call back. Thanks.

## 2014-10-31 ENCOUNTER — Telehealth: Payer: Self-pay | Admitting: *Deleted

## 2014-10-31 NOTE — Telephone Encounter (Signed)
-----   Message from Imogene Burn, PA-C sent at 10/23/2014  8:04 AM EST ----- Labs stable

## 2014-11-07 ENCOUNTER — Other Ambulatory Visit: Payer: Self-pay | Admitting: Physician Assistant

## 2014-11-08 ENCOUNTER — Encounter: Payer: Self-pay | Admitting: Internal Medicine

## 2014-11-26 ENCOUNTER — Other Ambulatory Visit: Payer: Self-pay | Admitting: Physician Assistant

## 2014-11-30 ENCOUNTER — Encounter (HOSPITAL_COMMUNITY)
Admission: RE | Admit: 2014-11-30 | Discharge: 2014-11-30 | Disposition: A | Discharge status: Home / Self Care | Payer: 59 | Source: Ambulatory Visit | Attending: Internal Medicine | Admitting: Internal Medicine

## 2014-11-30 DIAGNOSIS — I4901 Ventricular fibrillation: Secondary | ICD-10-CM | POA: Insufficient documentation

## 2014-11-30 NOTE — Progress Notes (Signed)
Cardiac Rehab Medication Review by a Pharmacist  Does the patient  feel that his/her medications are working for him/her?  yes  Has the patient been experiencing any side effects to the medications prescribed?  no  Does the patient measure his/her own blood pressure or blood glucose at home?  no   Does the patient have any problems obtaining medications due to transportation or finances?   no  Understanding of regimen: good Understanding of indications: good Potential of compliance: good    Pharmacist comments: Pleasant 29 yo F presenting to cardiac rehab orientation, walking with the assistance of a cane. Patient has excellent understanding of medication regimen and reasoning for each medication. Patient does not take BP at home. Patient states that if she misses any medications it is only one time a week. All questions were answered.  Thank you for allowing pharmacy to be part of this patient's care team  Nathanyal Ashmead M. Basha Krygier, Pharm.D Clinical Pharmacy Resident Pager: [phone] 11/30/2014 .9:12 AM

## 2014-12-04 ENCOUNTER — Encounter (HOSPITAL_COMMUNITY)
Admission: RE | Admit: 2014-12-04 | Discharge: 2014-12-04 | Disposition: A | Discharge status: Home / Self Care | Payer: 59 | Source: Ambulatory Visit | Attending: Internal Medicine | Admitting: Internal Medicine

## 2014-12-04 DIAGNOSIS — I4901 Ventricular fibrillation: Secondary | ICD-10-CM | POA: Diagnosis present

## 2014-12-04 NOTE — Progress Notes (Signed)
Pt started cardiac rehab today.  Pt tolerated light exercise without difficulty.  Miss Chalk came in with a cane but did not need during exercise. Jaylenne is deconditioned and says she has not done any exercise at home except when she walks at the store. Patient complained of feeling tired on the stationary bike. Heart rate noted at 147 sinus tach. Heart rate recovered to 100 Sinus tach after resting. Vital signs stable. PHQ =0. Easton's short term goals are to increase her strength and to have less shortness of breath. Zanyiah's long term goals are to feel better not get winded and to do more at home. Brianny's mom passed away in 07-13-23 from a heart attack. Emotional support given. Darilyn said she received some counseling over the summer from palliative care. Will continue to monitor the patient throughout  the program.

## 2014-12-06 ENCOUNTER — Encounter (HOSPITAL_COMMUNITY)
Admission: RE | Admit: 2014-12-06 | Discharge: 2014-12-06 | Disposition: A | Discharge status: Home / Self Care | Payer: 59 | Source: Ambulatory Visit | Attending: Internal Medicine | Admitting: Internal Medicine

## 2014-12-06 DIAGNOSIS — I4901 Ventricular fibrillation: Secondary | ICD-10-CM | POA: Diagnosis not present

## 2014-12-08 ENCOUNTER — Emergency Department (HOSPITAL_COMMUNITY)
Admission: EM | Admit: 2014-12-08 | Discharge: 2014-12-08 | Disposition: A | Discharge status: Home / Self Care | Payer: 59 | Attending: Emergency Medicine | Admitting: Emergency Medicine

## 2014-12-08 ENCOUNTER — Encounter (HOSPITAL_COMMUNITY): Payer: Self-pay | Admitting: Family Medicine

## 2014-12-08 ENCOUNTER — Encounter (HOSPITAL_COMMUNITY)
Admission: RE | Admit: 2014-12-08 | Discharge: 2014-12-08 | Disposition: A | Discharge status: Home / Self Care | Payer: 59 | Source: Ambulatory Visit | Attending: Internal Medicine | Admitting: Internal Medicine

## 2014-12-08 ENCOUNTER — Other Ambulatory Visit: Payer: Self-pay

## 2014-12-08 ENCOUNTER — Emergency Department (HOSPITAL_COMMUNITY): Payer: 59

## 2014-12-08 DIAGNOSIS — Z4502 Encounter for adjustment and management of automatic implantable cardiac defibrillator: Secondary | ICD-10-CM

## 2014-12-08 DIAGNOSIS — G43909 Migraine, unspecified, not intractable, without status migrainosus: Secondary | ICD-10-CM | POA: Diagnosis not present

## 2014-12-08 DIAGNOSIS — T82198A Other mechanical complication of other cardiac electronic device, initial encounter: Secondary | ICD-10-CM | POA: Diagnosis present

## 2014-12-08 DIAGNOSIS — Z7952 Long term (current) use of systemic steroids: Secondary | ICD-10-CM | POA: Insufficient documentation

## 2014-12-08 DIAGNOSIS — R011 Cardiac murmur, unspecified: Secondary | ICD-10-CM | POA: Insufficient documentation

## 2014-12-08 DIAGNOSIS — I472 Ventricular tachycardia, unspecified: Secondary | ICD-10-CM | POA: Insufficient documentation

## 2014-12-08 DIAGNOSIS — Y831 Surgical operation with implant of artificial internal device as the cause of abnormal reaction of the patient, or of later complication, without mention of misadventure at the time of the procedure: Secondary | ICD-10-CM | POA: Diagnosis not present

## 2014-12-08 DIAGNOSIS — R1084 Generalized abdominal pain: Secondary | ICD-10-CM | POA: Insufficient documentation

## 2014-12-08 DIAGNOSIS — Z862 Personal history of diseases of the blood and blood-forming organs and certain disorders involving the immune mechanism: Secondary | ICD-10-CM | POA: Insufficient documentation

## 2014-12-08 DIAGNOSIS — I471 Supraventricular tachycardia, unspecified: Secondary | ICD-10-CM

## 2014-12-08 DIAGNOSIS — R110 Nausea: Secondary | ICD-10-CM | POA: Diagnosis not present

## 2014-12-08 DIAGNOSIS — Z79899 Other long term (current) drug therapy: Secondary | ICD-10-CM | POA: Diagnosis not present

## 2014-12-08 DIAGNOSIS — Z0389 Encounter for observation for other suspected diseases and conditions ruled out: Secondary | ICD-10-CM

## 2014-12-08 DIAGNOSIS — K5090 Crohn's disease, unspecified, without complications: Secondary | ICD-10-CM | POA: Diagnosis not present

## 2014-12-08 HISTORY — DX: Supraventricular tachycardia: I47.1

## 2014-12-08 HISTORY — DX: Supraventricular tachycardia, unspecified: I47.10

## 2014-12-08 LAB — URINALYSIS, ROUTINE W REFLEX MICROSCOPIC
Bilirubin Urine: NEGATIVE
GLUCOSE, UA: NEGATIVE mg/dL
Hgb urine dipstick: NEGATIVE
KETONES UR: NEGATIVE mg/dL
NITRITE: NEGATIVE
Protein, ur: NEGATIVE mg/dL
SPECIFIC GRAVITY, URINE: 1.007 (ref 1.005–1.030)
UROBILINOGEN UA: 0.2 mg/dL (ref 0.0–1.0)
pH: 5.5 (ref 5.0–8.0)

## 2014-12-08 LAB — CBC WITH DIFFERENTIAL/PLATELET
BASOS ABS: 0 10*3/uL (ref 0.0–0.1)
BASOS PCT: 0 % (ref 0–1)
Eosinophils Absolute: 0.1 10*3/uL (ref 0.0–0.7)
Eosinophils Relative: 1 % (ref 0–5)
HEMATOCRIT: 34.6 % — AB (ref 36.0–46.0)
Hemoglobin: 11.5 g/dL — ABNORMAL LOW (ref 12.0–15.0)
Lymphocytes Relative: 19 % (ref 12–46)
Lymphs Abs: 1.6 10*3/uL (ref 0.7–4.0)
MCH: 28.7 pg (ref 26.0–34.0)
MCHC: 33.2 g/dL (ref 30.0–36.0)
MCV: 86.3 fL (ref 78.0–100.0)
Monocytes Absolute: 1.1 10*3/uL — ABNORMAL HIGH (ref 0.1–1.0)
Monocytes Relative: 13 % — ABNORMAL HIGH (ref 3–12)
Neutro Abs: 5.7 10*3/uL (ref 1.7–7.7)
Neutrophils Relative %: 67 % (ref 43–77)
Platelets: 314 10*3/uL (ref 150–400)
RBC: 4.01 MIL/uL (ref 3.87–5.11)
RDW: 12.1 % (ref 11.5–15.5)
WBC: 8.6 10*3/uL (ref 4.0–10.5)

## 2014-12-08 LAB — MAGNESIUM: Magnesium: 1.8 mg/dL (ref 1.5–2.5)

## 2014-12-08 LAB — URINE MICROSCOPIC-ADD ON

## 2014-12-08 LAB — COMPREHENSIVE METABOLIC PANEL
ALK PHOS: 111 U/L (ref 39–117)
ALT: 11 U/L (ref 0–35)
ANION GAP: 5 (ref 5–15)
AST: 15 U/L (ref 0–37)
Albumin: 2.1 g/dL — ABNORMAL LOW (ref 3.5–5.2)
BUN: 8 mg/dL (ref 6–23)
CO2: 27 mmol/L (ref 19–32)
Calcium: 8.5 mg/dL (ref 8.4–10.5)
Chloride: 105 mmol/L (ref 96–112)
Creatinine, Ser: 1.16 mg/dL — ABNORMAL HIGH (ref 0.50–1.10)
GFR calc non Af Amer: 64 mL/min — ABNORMAL LOW (ref >90)
GFR, EST AFRICAN AMERICAN: 74 mL/min — AB (ref >90)
GLUCOSE: 85 mg/dL (ref 70–99)
Potassium: 3.7 mmol/L (ref 3.5–5.1)
SODIUM: 137 mmol/L (ref 135–145)
Total Bilirubin: 0.2 mg/dL — ABNORMAL LOW (ref 0.3–1.2)
Total Protein: 6.1 g/dL (ref 6.0–8.3)

## 2014-12-08 LAB — TROPONIN I: Troponin I: 0.04 ng/mL — ABNORMAL HIGH (ref <0.031)

## 2014-12-08 LAB — TSH: TSH: 1.053 u[IU]/mL (ref 0.350–4.500)

## 2014-12-08 MED ORDER — PROPRANOLOL HCL ER 80 MG PO CP24
80.0000 mg | ORAL_CAPSULE | Freq: Every day | ORAL | Status: DC
  Administered 2014-12-08: 80 mg via ORAL
  Filled 2014-12-08 (×3): qty 1

## 2014-12-08 MED ORDER — PROPRANOLOL HCL ER 80 MG PO CP24: 80.0000 mg | ORAL_CAPSULE | Freq: Every day | ORAL | Status: DC

## 2014-12-08 MED ORDER — SODIUM CHLORIDE 0.9 % IV BOLUS (SEPSIS)
500.0000 mL | Freq: Once | INTRAVENOUS | Status: AC
  Administered 2014-12-08: 500 mL via INTRAVENOUS

## 2014-12-08 NOTE — Progress Notes (Signed)
Patient reported that her defibrillator fired twice after she was dropped off by her father in the [facility] entrance to attend cardiac rehab. Julie Saunders was brought to cardiac rehab by another hospital employee via wheelchair. Patient placed on the [place] found to be in Sinus tach 99-100. Patient awake and oriented.  Patient transported to the ED via wheelchair. Rapid response RN called to assist with transport. Engineer, maintenance (IT) called and notified. Report given to ED RN and PA.  Patient's father called and notified of events. [name] spoke with his daughter via telephone. This would have been [name] third day of exercise at cardiac rehab. [name] did not exercise today.

## 2014-12-08 NOTE — ED Notes (Signed)
Pt st. Jude defibrillator interrogated.

## 2014-12-08 NOTE — Discharge Instructions (Signed)
Today your pacemaker fired from an arrhythmia of your heart. Follow-up with Dr. Caryl Comes next week at your scheduled appointment. Continue to take propranolol 80 mg once a day. Return to the ER with any chest pain, shortness of breath, lightheadedness, feeling like you're going to pass out, nausea, vomiting, severe diarrhea.

## 2014-12-08 NOTE — ED Provider Notes (Signed)
CSN: [id number]     Arrival date & time 12/08/14  1449 History   First MD Initiated Contact with Patient 12/08/14 1456     Chief Complaint  Patient presents with  . AICD Fired      (Consider location/radiation/quality/duration/timing/severity/associated sxs/prior Treatment) HPI Julie Saunders is a 29 year old female with past medical history of Crohn's disease, St. Jude defibrillator placement status post cardiac arrest secondary to hyperkalemia and prolonged QT after an episode of a Crohn's flareup and profuse diarrhea who presents the ER after discharge of her defibrillator today. Patient states she was walking to cardiac rehabilitation when her defibrillator went off. Patient reports associated nausea throughout the day today, however denies any associated vomiting, syncope, chest pain shortness of breath, headache, dizziness, weakness, recent illness. Patient states she has loose stools all the time, however does not endorse having a Crohn's flareup currently.  Past Medical History  Diagnosis Date  . Crohn disease   . Migraine   . Allergy   . Pancytopenia 11/23/2012  . Cardiac arrest   . Bradycardia    Past Surgical History  Procedure Laterality Date  . Implantable cardioverter defibrillator implant  09/12/2014    STJ dual chamber ICD implanted by Dr Caryl Comes for aborted cardiac arrest  . Electrophysiology study N/A 09/11/2014    Procedure: ELECTROPHYSIOLOGY STUDY;  Surgeon: Deboraha Sprang, MD;  Location: [facility] CATH LAB;  Service: Cardiovascular;  Laterality: N/A;  . Implantable cardioverter defibrillator implant N/A 09/13/2014    Procedure: IMPLANTABLE CARDIOVERTER DEFIBRILLATOR IMPLANT;  Surgeon: Deboraha Sprang, MD;  Location: [facility] CATH LAB;  Service: Cardiovascular;  Laterality: N/A;   Family History  Problem Relation Age of Onset  . Heart attack Mother    History  Substance Use Topics  . Smoking status: Never Smoker   . Smokeless tobacco: Not on file  . Alcohol Use: 0.5 oz/week    1  drink(s) per week   OB History    No data available     Review of Systems  Constitutional: Negative for fever.  HENT: Negative for trouble swallowing.   Eyes: Negative for visual disturbance.  Respiratory: Negative for shortness of breath.   Cardiovascular: Negative for chest pain.       Defibrillator activation  Gastrointestinal: Positive for nausea. Negative for vomiting and abdominal pain.  Genitourinary: Negative for dysuria.  Musculoskeletal: Negative for neck pain.  Skin: Negative for rash.  Neurological: Negative for dizziness, weakness and numbness.  Psychiatric/Behavioral: Negative.       Allergies  Sulfa antibiotics  Home Medications   Prior to Admission medications   Medication Sig Start Date End Date Taking? Authorizing Provider  acetaminophen (TYLENOL) 325 MG tablet Take 650 mg by mouth every 6 (six) hours as needed for headache.   Yes Historical Provider, MD  ALPRAZolam (XANAX) 0.25 MG tablet Take 0.25 mg by mouth 3 (three) times daily as needed for anxiety.   Yes Historical Provider, MD  cetirizine (ZYRTEC) 10 MG tablet Take 10 mg by mouth daily.   Yes Historical Provider, MD  dimenhyDRINATE (DRAMAMINE) 50 MG tablet Take 50 mg by mouth daily as needed for dizziness.   Yes Historical Provider, MD  famotidine (PEPCID) 20 MG tablet Take 1 tablet (20 mg total) by mouth 2 (two) times daily. 09/14/14  Yes Debbe Odea, MD  folic acid (FOLVITE) 244 MCG tablet Take 800 mcg by mouth daily.    Yes Historical Provider, MD  HYDROcodone-acetaminophen (NORCO/VICODIN) 5-325 MG per tablet Take 1 tablet by mouth every  4 (four) hours as needed for moderate pain. 09/14/14  Yes Debbe Odea, MD  loperamide (IMODIUM A-D) 2 MG tablet Take 2 mg by mouth as needed for diarrhea or loose stools.   Yes Historical Provider, MD  mesalamine (PENTASA) 250 MG CR capsule Take 1,000 mg by mouth 4 (four) times daily.   Yes Historical Provider, MD  Pediatric Multiple Vit-C-FA (ANIMAL CHEWABLE  MULTIVITAMIN PO) Take 1 tablet by mouth daily.   Yes Historical Provider, MD  potassium chloride SA (KLOR-CON M20) 20 MEQ tablet Take 1 tablet (20 mEq total) by mouth daily. 11/27/14  Yes Deboraha Sprang, MD  Probiotic Product (ALIGN PO) Take 1 tablet by mouth daily.   Yes Historical Provider, MD  promethazine (PHENERGAN) 25 MG tablet Take 25 mg by mouth every 4 (four) hours as needed for nausea or vomiting.   Yes Historical Provider, MD  spironolactone (ALDACTONE) 12.5 mg TABS tablet Take 0.5 tablets (12.5 mg total) by mouth daily. 09/14/14  Yes Debbe Odea, MD  acidophilus (RISAQUAD) CAPS capsule Take 2 capsules by mouth daily. Patient taking differently: Take 1 capsule by mouth daily.  09/14/14   Debbe Odea, MD  predniSONE (DELTASONE) 20 MG tablet Take 1 tablet (20 mg total) by mouth daily with breakfast. Patient not taking: Reported on 11/30/2014 09/15/14   Debbe Odea, MD  propranolol ER (INDERAL LA) 80 MG 24 hr capsule TAKE ONE CAPSULE BY MOUTH DAILY Patient not taking: Reported on 11/30/2014 11/08/14   Deboraha Sprang, MD  propranolol ER (INDERAL LA) 80 MG 24 hr capsule Take 1 capsule (80 mg total) by mouth daily. 12/08/14   Carrie Mew, PA-C   BP 108/61 mmHg  Pulse 71  Temp(Src) 99 F (37.2 C) (Oral)  Resp 17  Ht 5\' 5"  (1.651 m)  Wt 143 lb (64.864 kg)  BMI 23.80 kg/m2  SpO2 99%  LMP 11/14/2014 Physical Exam  Constitutional: She is oriented to person, place, and time. She appears well-developed and well-nourished. No distress.  HENT:  Head: Normocephalic and atraumatic.  Mouth/Throat: Oropharynx is clear and moist. No oropharyngeal exudate.  Eyes: Right eye exhibits no discharge. Left eye exhibits no discharge. No scleral icterus.  Neck: Normal range of motion.  Cardiovascular: Normal rate, regular rhythm and S2 normal.   Murmur heard.  Systolic murmur is present with a grade of 2/6  Pulses:      Radial pulses are 2+ on the right side, and 2+ on the left side.  Murmur best  heard at LUSB.    Pulmonary/Chest: Effort normal and breath sounds normal. No respiratory distress.  Abdominal: Soft. Normal appearance. There is generalized tenderness. There is no rigidity, no guarding, no tenderness at McBurney's point and negative Murphy's sign.  Generalized tenderness with no point tenderness.   Musculoskeletal: Normal range of motion. She exhibits no edema or tenderness.  Neurological: She is alert and oriented to person, place, and time. No cranial nerve deficit or sensory deficit. Coordination normal. GCS eye subscore is 4. GCS verbal subscore is 5. GCS motor subscore is 6.  Skin: Skin is warm and dry. No rash noted. She is not diaphoretic.  Psychiatric: She has a normal mood and affect.  Nursing note and vitals reviewed.   ED Course  Procedures (including critical care time) Labs Review Labs Reviewed  CBC WITH DIFFERENTIAL/PLATELET - Abnormal; Notable for the following:    Hemoglobin 11.5 (*)    HCT 34.6 (*)    Monocytes Relative 13 (*)  Monocytes Absolute 1.1 (*)    All other components within normal limits  COMPREHENSIVE METABOLIC PANEL - Abnormal; Notable for the following:    Creatinine, Ser 1.16 (*)    Albumin 2.1 (*)    Total Bilirubin 0.2 (*)    GFR calc non Af Amer 64 (*)    GFR calc Af Amer 74 (*)    All other components within normal limits  URINALYSIS, ROUTINE W REFLEX MICROSCOPIC - Abnormal; Notable for the following:    Leukocytes, UA TRACE (*)    All other components within normal limits  TROPONIN I - Abnormal; Notable for the following:    Troponin I 0.04 (*)    All other components within normal limits  URINE MICROSCOPIC-ADD ON - Abnormal; Notable for the following:    Squamous Epithelial / LPF FEW (*)    Casts HYALINE CASTS (*)    All other components within normal limits  MAGNESIUM  TSH    Imaging Review Dg Chest 2 View  12/08/2014   CLINICAL DATA:  AICD activation.  EXAM: CHEST  2 VIEW  COMPARISON:  09/14/2014  FINDINGS: The  heart size and mediastinal contours are within normal limits. The pacer wires are normal. Both lungs are clear. The visualized skeletal structures are unremarkable.  IMPRESSION: Normal chest x-ray   Electronically Signed   By: Kalman Jewels M.D.   On: 12/08/2014 15:41     EKG Interpretation None      MDM   Final diagnoses:  Defibrillator discharge    Patient here after discharge of AICD while walking the cardiac rehabilitation. Patient largely asymptomatic in the ER. Device interrogated and revealed polymorphic V. tach with a narrow complex tachycardia afterwards. Patient defibrillated once back in sinus rhythm. I spoke with Dr. Rayann Heman who recommends checking electrolytes and reports he will follow-up with patient once interrogation is complete.  Electrolytes without evidence of acute pathology. More specifically, no evidence of hypokalemia. Patient's EKG unremarkable for acute injury or ectopy.  Dr. Rayann Heman with electrophysiology has seen and evaluated patient. Dr. Rayann Heman recommends starting dose of propanolol 80 mg by mouth in the ER, and writing patient prescription for propranolol by mouth 80 mg daily, and discharging to have her follow-up with Dr. Caryl Comes as outpatient. Please see Dr. Jackalyn Lombard note. Patient hemodynamically stable during ER stay. Patient nontoxic tachycardic, nontachypneic, non-hypoxic, well-appearing and in no acute distress. Patient to be discharged at this time. I discussed return precautions with patient, and patient verbalizes understanding and agreement of this plan. I encouraged patient to call or return to the ER should she have any questions or concerns.  BP 108/61 mmHg  Pulse 71  Temp(Src) 99 F (37.2 C) (Oral)  Resp 17  Ht 5\' 5"  (1.651 m)  Wt 143 lb (64.864 kg)  BMI 23.80 kg/m2  SpO2 99%  LMP 11/14/2014  Signed,  Dahlia Bailiff, PA-C 6:05 PM  Patient discussed with Dr. Virgel Manifold, M.D.   Carrie Mew, PA-C 12/08/14 1805  Virgel Manifold,  MD 12/11/14 [phone]

## 2014-12-08 NOTE — Consult Note (Signed)
Electrophysiology Office Note   Date:  12/08/2014   ID:  Julie Saunders, DOB May 12, 1986, MRN [id number]  PCP:  Donnie Coffin, MD   Primary Electrophysiologist:  Dr Caryl Comes Consulting:  ED staff  Chief Complaint  Patient presents with  . AICD Fired      History of Present Illness: Julie Saunders is a 29 y.o. female who presents today for electrophysiology evaluation.   She underwent ICD implantation 11/15 following successful resuscitation from a VF arrest.  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  Her EF was initially depressed but recovered.  Her anoxic encephalopathy continues to improve.  Today, she was walking to cardiac rehab when she felt her defibrillator fire three times.  She therefore presented to the [facility] for further management.  She denies syncope or symptoms otherwise.  This am she had mild nausea which has resolved.  She reports that she stopped taking her propranolol in November due to hypotension/ dizziness.  She has been on no beta blocker therapy since that time.  Today, she denies symptoms of palpitations, chest pain, shortness of breath, orthopnea, PND, lower extremity edema, claudication, dizziness, presyncope, syncope, bleeding, or neurologic sequela. The patient is tolerating medications without difficulties and is otherwise without complaint today.    Past Medical History  Diagnosis Date  . Crohn disease   . Migraine   . Allergy   . Pancytopenia 11/23/2012  . Cardiac arrest   . Bradycardia    Past Surgical History  Procedure Laterality Date  . Implantable cardioverter defibrillator implant  09/12/2014    STJ dual chamber ICD implanted by Dr Caryl Comes for aborted cardiac arrest  . Electrophysiology study N/A 09/11/2014    Procedure: ELECTROPHYSIOLOGY STUDY;  Surgeon: Deboraha Sprang, MD;  Location: [facility] CATH LAB;  Service: Cardiovascular;  Laterality: N/A;  . Implantable cardioverter defibrillator implant N/A 09/13/2014    Procedure: IMPLANTABLE  CARDIOVERTER DEFIBRILLATOR IMPLANT;  Surgeon: Deboraha Sprang, MD;  Location: [facility] CATH LAB;  Service: Cardiovascular;  Laterality: N/A;     Current Facility-Administered Medications  Medication Dose Route Frequency Provider Last Rate Last Dose  . propranolol ER (INDERAL LA) 24 hr capsule 80 mg  80 mg Oral Daily Carrie Mew, PA-C       Current Outpatient Prescriptions  Medication Sig Dispense Refill  . acetaminophen (TYLENOL) 325 MG tablet Take 650 mg by mouth every 6 (six) hours as needed for headache.    . ALPRAZolam (XANAX) 0.25 MG tablet Take 0.25 mg by mouth 3 (three) times daily as needed for anxiety.    . cetirizine (ZYRTEC) 10 MG tablet Take 10 mg by mouth daily.    Marland Kitchen dimenhyDRINATE (DRAMAMINE) 50 MG tablet Take 50 mg by mouth daily as needed for dizziness.    . famotidine (PEPCID) 20 MG tablet Take 1 tablet (20 mg total) by mouth 2 (two) times daily. 60 tablet 0  . folic acid (FOLVITE) 902 MCG tablet Take 800 mcg by mouth daily.     Marland Kitchen HYDROcodone-acetaminophen (NORCO/VICODIN) 5-325 MG per tablet Take 1 tablet by mouth every 4 (four) hours as needed for moderate pain. 15 tablet 0  . loperamide (IMODIUM A-D) 2 MG tablet Take 2 mg by mouth as needed for diarrhea or loose stools.    . mesalamine (PENTASA) 250 MG CR capsule Take 1,000 mg by mouth 4 (four) times daily.    . Pediatric Multiple Vit-C-FA (ANIMAL CHEWABLE MULTIVITAMIN PO) Take 1 tablet by mouth daily.    Marland Kitchen  potassium chloride SA (KLOR-CON M20) 20 MEQ tablet Take 1 tablet (20 mEq total) by mouth daily. 30 tablet 1  . Probiotic Product (ALIGN PO) Take 1 tablet by mouth daily.    . promethazine (PHENERGAN) 25 MG tablet Take 25 mg by mouth every 4 (four) hours as needed for nausea or vomiting.    Marland Kitchen spironolactone (ALDACTONE) 12.5 mg TABS tablet Take 0.5 tablets (12.5 mg total) by mouth daily. 30 tablet 3  . acidophilus (RISAQUAD) CAPS capsule Take 2 capsules by mouth daily. (Patient taking differently: Take 1 capsule by mouth  daily. ) 60 capsule 3  . predniSONE (DELTASONE) 20 MG tablet Take 1 tablet (20 mg total) by mouth daily with breakfast. (Patient not taking: Reported on 11/30/2014) 30 tablet 0  . propranolol ER (INDERAL LA) 80 MG 24 hr capsule TAKE ONE CAPSULE BY MOUTH DAILY (Patient not taking: Reported on 11/30/2014) 30 capsule 0    Allergies:   Sulfa antibiotics   Social History:  The patient  reports that she has never smoked. She does not have any smokeless tobacco history on file. She reports that she drinks about 0.5 oz of alcohol per week. She reports that she does not use illicit drugs.   Family History:  The patient's family history includes Heart attack in her mother.    ROS:  Please see the history of present illness.   All other systems are reviewed and negative.    PHYSICAL EXAM: VS:  BP 111/67 mmHg  Pulse 76  Temp(Src) 99.3 F (37.4 C) (Oral)  Resp 21  Ht 5' 5"  (1.651 m)  Wt 143 lb (64.864 kg)  BMI 23.80 kg/m2  SpO2 100%  LMP 11/14/2014 , BMI Body mass index is 23.8 kg/(m^2). GEN: Well nourished,thin, in no acute distress HEENT: normal Neck: no JVD, carotid bruits, or masses Cardiac: RRR; no murmurs, rubs, or gallops,no edema  Respiratory:  clear to auscultation bilaterally, normal work of breathing GI: soft, nontender, nondistended, + BS MS: no deformity or atrophy Skin: warm and dry, device pocket is well healed Neuro:  Strength and sensation are intact Psych: euthymic mood, full affect  EKG:  EKG is ordered today. The ekg ordered today shows sinus rhythm 92 bpm, no ischemic changes, QT is 475  Device interrogation (Merlin on demand) is reviewed today in detail    Recent Labs: 09/06/2014: Pro B Natriuretic peptide (BNP) 43778.0* 09/11/2014: TSH 4.260 12/08/2014: ALT 11; BUN 8; Creatinine 1.16*; Hemoglobin 11.5*; Magnesium 1.8; Platelets 314; Potassium 3.7; Sodium 137    Lipid Panel  No results found for: CHOL, TRIG, HDL, CHOLHDL, VLDL, LDLCALC, LDLDIRECT   Wt  Readings from Last 3 Encounters:  12/08/14 143 lb (64.864 kg)  11/30/14 147 lb 4.3 oz (66.8 kg)  10/09/14 152 lb (68.947 kg)      Other studies Reviewed: Additional studies/ records that were reviewed today include: cxr  Review of the above records today demonstrates: no ASDz   ASSESSMENT AND PLAN:  1.  Polymorphic VT/ SVT The patient received 3 ICD shocks today.  I have reviewed her device interrogation with both Dr Caryl Comes and also with [facility] representative.  The arrhythmia began as polymorphic VT and then appears to have converted to a 1:1 SVT at 250 msec.  3 shocks were required to terminate her arrhythmia.  No other arrhythmias have been detected.  She has not been taking her propranolol since November due to dizziness/ hypotension.  At this time, I think that the best option is  to restart her propranolol.  She will be given her first dose in the ER.  Keep K >3.9 and Mg.1.9 Check TSH No driving x 6 months (pt aware)  I have offered overnight observation which she declines.  She would prefer to go home with close outpatient follow-up.  Her father also agrees with this plan.  She will therefore go home today. Dr Caryl Comes with see patient back in the office next Thursday at 1:30   Labs/ tests ordered today include:  Orders Placed This Encounter  Procedures  . DG Chest 2 View  . CBC with Differential  . Comprehensive metabolic panel  . Urinalysis, Routine w reflex microscopic  . Troponin I  . Magnesium  . TSH  . EKG 12-Lead    Electrophysiology team to see as needed while here. Please call with questions.   SignedThompson Grayer, MD  12/08/2014 5:11 PM     [address] [place] [address] [phone] (office) [phone] (fax)

## 2014-12-08 NOTE — ED Notes (Signed)
Pt presents from Cardiac Rehab with c/o AICD firing at 1440 today. Pt was walking to Cardiac Rehab when she felt her AICD fire x2 and Cardiac Rehab RN brought pt to ED. Per PT, pt had a cardiac arrest in October and today was her 3rd day for Cardiac Rehab.  Pt with hx Crohn Disease. Pt is A&Ox4

## 2014-12-11 ENCOUNTER — Telehealth (HOSPITAL_COMMUNITY): Payer: Self-pay | Admitting: *Deleted

## 2014-12-11 ENCOUNTER — Encounter (HOSPITAL_COMMUNITY): Admission: RE | Admit: 2014-12-11 | Payer: 59 | Source: Ambulatory Visit

## 2014-12-11 ENCOUNTER — Telehealth: Payer: Self-pay | Admitting: Internal Medicine

## 2014-12-11 NOTE — Telephone Encounter (Signed)
NEw Message  Pt called to confirm appt she thought was on Thurs. 2/4. Per pt- pt was admitted to hosp on 1/29 was told by Dr. Rayann Heman that she has appt w/ Caryl Comes on 2/4. Note in Chart reads- "Dr Caryl Comes with see patient back in the office next Thursday at 1:30". The only appt she has is for next Tues at 2/9. She is confused and wanted to speak w/ Rn. Please call back and discuss.

## 2014-12-11 NOTE — Telephone Encounter (Signed)
Informed patient that next Tuesday 2/9 is correct day for appt. Patient verbalized understanding and agreeable to plan.

## 2014-12-13 ENCOUNTER — Encounter (HOSPITAL_COMMUNITY): Payer: 59

## 2014-12-14 ENCOUNTER — Encounter: Payer: Self-pay | Admitting: Internal Medicine

## 2014-12-15 ENCOUNTER — Encounter (HOSPITAL_COMMUNITY): Payer: 59

## 2014-12-18 ENCOUNTER — Encounter (HOSPITAL_COMMUNITY): Payer: 59

## 2014-12-18 ENCOUNTER — Encounter: Payer: 59 | Admitting: Internal Medicine

## 2014-12-19 ENCOUNTER — Encounter: Payer: Self-pay | Admitting: Internal Medicine

## 2014-12-19 ENCOUNTER — Telehealth: Payer: Self-pay | Admitting: Internal Medicine

## 2014-12-19 ENCOUNTER — Ambulatory Visit (INDEPENDENT_AMBULATORY_CARE_PROVIDER_SITE_OTHER): Payer: 59 | Admitting: Internal Medicine

## 2014-12-19 VITALS — BP 113/68 | HR 72 | Ht 65.0 in | Wt 139.6 lb

## 2014-12-19 DIAGNOSIS — Z4502 Encounter for adjustment and management of automatic implantable cardiac defibrillator: Secondary | ICD-10-CM

## 2014-12-19 DIAGNOSIS — I469 Cardiac arrest, cause unspecified: Secondary | ICD-10-CM

## 2014-12-19 LAB — MDC_IDC_ENUM_SESS_TYPE_INCLINIC
Brady Statistic RA Percent Paced: 4.6 %
Brady Statistic RV Percent Paced: 0.03 %
Date Time Interrogation Session: 20160209151504
HighPow Impedance: 63 Ohm
Implantable Pulse Generator Serial Number: 7179320
Lead Channel Impedance Value: 362.5 Ohm
Lead Channel Impedance Value: 587.5 Ohm
Lead Channel Pacing Threshold Amplitude: 0.5 V
Lead Channel Pacing Threshold Amplitude: 0.5 V
Lead Channel Pacing Threshold Pulse Width: 0.4 ms
Lead Channel Pacing Threshold Pulse Width: 0.4 ms
Lead Channel Sensing Intrinsic Amplitude: 11.5 mV
Lead Channel Setting Pacing Amplitude: 2 V
Lead Channel Setting Pacing Amplitude: 2.5 V
Lead Channel Setting Pacing Pulse Width: 0.4 ms
MDC IDC MSMT BATTERY REMAINING LONGEVITY: 97.2 mo
MDC IDC MSMT LEADCHNL RA PACING THRESHOLD AMPLITUDE: 0.5 V
MDC IDC MSMT LEADCHNL RA PACING THRESHOLD PULSEWIDTH: 0.4 ms
MDC IDC MSMT LEADCHNL RA SENSING INTR AMPL: 5 mV
MDC IDC MSMT LEADCHNL RV PACING THRESHOLD AMPLITUDE: 0.5 V
MDC IDC MSMT LEADCHNL RV PACING THRESHOLD PULSEWIDTH: 0.4 ms
MDC IDC SET LEADCHNL RV SENSING SENSITIVITY: 0.5 mV
Zone Setting Detection Interval: 270 ms

## 2014-12-19 MED ORDER — PROPRANOLOL HCL ER 120 MG PO CP24: 120.0000 mg | ORAL_CAPSULE | Freq: Every day | ORAL | Status: DC

## 2014-12-19 NOTE — Telephone Encounter (Signed)
New message      Potassium was 5.5 on 12-14-14 drawn by Dr Michail Sermon.  Should she still take the potassium supplement.  Also, can she use scar cream on her scar from when she had a defib put in?

## 2014-12-19 NOTE — Progress Notes (Signed)
Patient Care Team: Donnie Coffin, MD as PCP - General (Family Medicine)   HPI  Julie Saunders is a 29 y.o. female Seen in follow-up for aborted cardiac arrest.  No specific cause was identified. She was treated with beta blockers initially but these were stopped because of hypotension and dizziness. During cardiac rehabilitation she had an episode with ICD discharges. Dr. Greggory Brandy looked at these. I agree with his interpretation. 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 ICD discharge initially failed to terminate the SVT  She was re-started on her beta blockers and she has tolerated them well.  Last potassium level was 3.7. She is taking Aldactone  Past Medical History  Diagnosis Date  . Crohn disease   . Migraine   . Allergy   . Pancytopenia 11/23/2012  . Cardiac arrest   . Bradycardia     Past Surgical History  Procedure Laterality Date  . Implantable cardioverter defibrillator implant  09/12/2014    STJ dual chamber ICD implanted by Dr Caryl Comes for aborted cardiac arrest  . Electrophysiology study N/A 09/11/2014    Procedure: ELECTROPHYSIOLOGY STUDY;  Surgeon: Deboraha Sprang, MD;  Location: [facility] CATH LAB;  Service: Cardiovascular;  Laterality: N/A;  . Implantable cardioverter defibrillator implant N/A 09/13/2014    Procedure: IMPLANTABLE CARDIOVERTER DEFIBRILLATOR IMPLANT;  Surgeon: Deboraha Sprang, MD;  Location: [facility] CATH LAB;  Service: Cardiovascular;  Laterality: N/A;    Current Outpatient Prescriptions  Medication Sig Dispense Refill  . acetaminophen (TYLENOL) 325 MG tablet Take 650 mg by mouth every 6 (six) hours as needed for headache.    . ALPRAZolam (XANAX) 0.25 MG tablet Take 0.25 mg by mouth 3 (three) times daily as needed for anxiety.    . cetirizine (ZYRTEC) 10 MG tablet Take 10 mg by mouth daily.    Marland Kitchen dimenhyDRINATE (DRAMAMINE) 50 MG tablet Take 50 mg by mouth daily as needed for dizziness.    . famotidine (PEPCID) 20  MG tablet Take 1 tablet (20 mg total) by mouth 2 (two) times daily. 60 tablet 0  . folic acid (FOLVITE) 160 MCG tablet Take 800 mcg by mouth daily.     Marland Kitchen HYDROcodone-acetaminophen (NORCO/VICODIN) 5-325 MG per tablet Take 1 tablet by mouth every 4 (four) hours as needed for moderate pain. 15 tablet 0  . loperamide (IMODIUM A-D) 2 MG tablet Take 2 mg by mouth as needed for diarrhea or loose stools.    . mesalamine (PENTASA) 250 MG CR capsule Take 1,000 mg by mouth 4 (four) times daily.    . Pediatric Multiple Vit-C-FA (ANIMAL CHEWABLE MULTIVITAMIN PO) Take 1 tablet by mouth daily.    . potassium chloride SA (KLOR-CON M20) 20 MEQ tablet Take 1 tablet (20 mEq total) by mouth daily. 30 tablet 1  . Probiotic Product (ALIGN PO) Take 1 tablet by mouth daily.    . promethazine (PHENERGAN) 25 MG tablet Take 25 mg by mouth every 4 (four) hours as needed for nausea or vomiting.    . propranolol ER (INDERAL LA) 80 MG 24 hr capsule Take 1 capsule (80 mg total) by mouth daily. 30 capsule 0  . spironolactone (ALDACTONE) 12.5 mg TABS tablet Take 0.5 tablets (12.5 mg total) by mouth daily. 30 tablet 3   No current facility-administered medications for this visit.    Allergies  Allergen Reactions  . Sulfa Antibiotics Rash    Review of Systems negative except from HPI and PMH  Physical Exam BP  113/68 mmHg  Pulse 72  Ht 5' 5"  (1.651 m)  Wt 139 lb 9.6 oz (63.322 kg)  BMI 23.23 kg/m2  LMP 11/14/2014 Well developed and well nourished in no acute distress HENT normal E scleral and icterus clear Neck Supple JVP flat; carotids brisk and full Clear to ausculation Device pocket well healed; without hematoma or erythema.  There is no tethering   Regular rate and rhythm, no murmurs gallops or rub Soft with active bowel sounds No clubbing cyanosis none Edema Alert and oriented, grossly normal motor and sensory function Skin Warm and Dry  ECG demonstrates sinus rhythm at 67 Intervals 14/09/37 T wave  inversions are present in V3 and V4 lead 3 and aVF  Assessment and  Plan Aborted cardiac arrest  Supraventricular tachycardia with a one-to-one AV relationship and a heart rate of 240-50 bpm  She's recovered nicely. We will continue her propranolol and increase it from 80--120.  Given the very rapid SVT, I would favor EP testing and catheter ablation of the substrate. We will wait for about 3 more months to allow for healing of the leads and then we'll see her again and plan referral to Dr. Greggory Brandy.

## 2014-12-19 NOTE — Patient Instructions (Addendum)
Your physician has recommended you make the following change in your medication:  1. Increase propranolol to 120 mg by mouth daily.  Remote monitoring is used to monitor your Pacemaker of ICD from home. This monitoring reduces the number of office visits required to check your device to one time per year. It allows [place] to keep an eye on the functioning of your device to ensure it is working properly. You are scheduled for a device check from home on 03/20/15. You may send your transmission at any time that day. If you have a wireless device, the transmission will be sent automatically. After your physician reviews your transmission, you will receive a postcard with your next transmission date.  Your physician wants you to follow-up in:  May with Dr. Caryl Comes.  You will receive a reminder letter in the mail two months in advance. If you don't receive a letter, please call our office to schedule the follow-up appointment.

## 2014-12-20 ENCOUNTER — Encounter (HOSPITAL_COMMUNITY): Admission: RE | Admit: 2014-12-20 | Payer: 59 | Source: Ambulatory Visit

## 2014-12-20 NOTE — Telephone Encounter (Signed)
contnue current K supplementation thanks

## 2014-12-20 NOTE — Telephone Encounter (Signed)
Dr. Caryl Comes -  You signed that it was ok to continue 20 K+ supplement.  Patient is on Aldactone. Do you want her to continue this dosage? Do you want to decrease to 10 mEq daily?  Do you want to decrease to QOD? What would you suggest.

## 2014-12-21 ENCOUNTER — Telehealth (HOSPITAL_COMMUNITY): Payer: Self-pay | Admitting: *Deleted

## 2014-12-22 ENCOUNTER — Encounter (HOSPITAL_COMMUNITY)
Admission: RE | Admit: 2014-12-22 | Discharge: 2014-12-22 | Disposition: A | Discharge status: Home / Self Care | Payer: 59 | Source: Ambulatory Visit | Attending: Internal Medicine | Admitting: Internal Medicine

## 2014-12-22 DIAGNOSIS — Z95810 Presence of automatic (implantable) cardiac defibrillator: Secondary | ICD-10-CM | POA: Diagnosis present

## 2014-12-22 NOTE — Telephone Encounter (Signed)
-----   Message from Deboraha Sprang, MD sent at 12/20/2014  8:59 PM EST ----- Regarding: RE: exercise at cardiac rehab She may return ----- Message -----    From: Magda Kiel, RN    Sent: 12/20/2014  11:17 AM      To: Deboraha Sprang, MD Subject: exercise at cardiac rehab                      Good morning Dr Caryl Comes,  Checking to see when you want Brayley to resume exercise at cardiac rehab. I am sorry to bother you but I didn't see anything about resuming exercise in your office note.   Please let me know if you have any concerns or restrictions  Thanks for your assistance,  Verdis Frederickson

## 2014-12-22 NOTE — Telephone Encounter (Signed)
Advised patient of Dr. Olin Pia orders to continue Potassium and clearance to use scar cream. Patient verbalized understanding and agreeable to plan.

## 2014-12-22 NOTE — Progress Notes (Signed)
Julie Saunders returned to exercise today per Dr Caryl Comes. Telemetry rhythm Sinus. Julie Saunders has lost about 4 lbs since her last exercise session on January 27 th.  Julie Saunders said she has seen her GI doctor and has been referred to a GI specialist at [facility] for her chron's disease. I told Julie Saunders to call if she has problems with diarrhea  Prior to coming to exercise at cardiac rehab. Reviewed Julie Saunders's quality of life questionnaire.  Julie Saunders denies being depressed.  Julie Saunders completed exercise without complaints or difficulty.  Telemetry rhythm Sinus in the 70's. Post exercise BP 94/60. Patient given water and Gatorade recheck blood pressure 105/62. Will continue to monitor the patient throughout  the program.

## 2014-12-25 ENCOUNTER — Encounter (HOSPITAL_COMMUNITY)
Admission: RE | Admit: 2014-12-25 | Discharge: 2014-12-25 | Disposition: A | Discharge status: Home / Self Care | Payer: 59 | Source: Ambulatory Visit | Attending: Internal Medicine | Admitting: Internal Medicine

## 2014-12-25 DIAGNOSIS — Z95810 Presence of automatic (implantable) cardiac defibrillator: Secondary | ICD-10-CM | POA: Diagnosis not present

## 2014-12-25 NOTE — Progress Notes (Signed)
Quality of life questionnaire forwarded to Dr Olin Pia office for review with Friday's exercise flow sheet.

## 2014-12-25 NOTE — Progress Notes (Signed)
Reviewed home exercise with pt today.  Pt plans to walk at home for exercise.  She also still has her band and exercises from working with home PT to use as well.  Reviewed THR, pulse, RPE, sign and symptoms, and when to call 911 or MD.  Pt voiced understanding. Alberteen Sam, MA, ACSM RCEP

## 2014-12-27 ENCOUNTER — Encounter (HOSPITAL_COMMUNITY)
Admission: RE | Admit: 2014-12-27 | Discharge: 2014-12-27 | Disposition: A | Discharge status: Home / Self Care | Payer: 59 | Source: Ambulatory Visit | Attending: Internal Medicine | Admitting: Internal Medicine

## 2014-12-27 DIAGNOSIS — Z95810 Presence of automatic (implantable) cardiac defibrillator: Secondary | ICD-10-CM | POA: Diagnosis not present

## 2014-12-27 NOTE — Progress Notes (Signed)
Questionable run of nonsustained  wide beat tachycardiac during cool down at cardiac rehab. Patient asymptomatic. Vital signs stable. I had Julie Hefty NP look at the ECG tracing he confirmed that the strip was artifact. The group was singing our volunteer happy birthday.  Julie Saunders has lost around 4 kg since starting cardiac rehab. Julie Saunders said she has had a decreased appetite but denies having recent diarrhea.  Will forward weights to patient's Gastroenterologist Dr Michail Saunders. Julie says she has been referred to a Crohn specialist as [facility]. Exit heart rate 67. Blood pressure 104/59. Will continue to monitor the patient throughout  the program.

## 2014-12-29 ENCOUNTER — Encounter (HOSPITAL_COMMUNITY)
Admission: RE | Admit: 2014-12-29 | Discharge: 2014-12-29 | Disposition: A | Discharge status: Home / Self Care | Payer: 59 | Source: Ambulatory Visit | Attending: Internal Medicine | Admitting: Internal Medicine

## 2014-12-29 DIAGNOSIS — Z95810 Presence of automatic (implantable) cardiac defibrillator: Secondary | ICD-10-CM | POA: Diagnosis not present

## 2014-12-29 NOTE — Progress Notes (Signed)
Lavaun spoke with Dr Michail Sermon and has been instructed to follow up with her primary care physician about her recent weight loss.  Cristie Hem has an appointment in March.

## 2015-01-01 ENCOUNTER — Encounter (HOSPITAL_COMMUNITY)
Admission: RE | Admit: 2015-01-01 | Discharge: 2015-01-01 | Disposition: A | Discharge status: Home / Self Care | Payer: 59 | Source: Ambulatory Visit | Attending: Internal Medicine | Admitting: Internal Medicine

## 2015-01-01 NOTE — Progress Notes (Signed)
Patient reports feeling short of breath with exertion today and feeling lightheaded when changing positions at home. Julie Saunders says it started this morning. Upon assessment lung fields clear upon ascultation. No peripheral edema noted. Oxygen saturation 100% on room air. Lying blood pressure 106/68 heart rate 58.  Sitting blood pressure 104/79 heart rate 72. Standing blood pressure 100/56 heart rate 77. Julie Saunders did report that she felt lightheaded when changing from lying to standing positions.  Julie Saunders's weight is 62.5 kg. Julie Saunders PAC called and notified of today's events. Julie said said Julie Saunders is okay to exercise she should call Julie Saunders if she feels like she is going to pass out. Julie Saunders did not exercise today but plans to return to exercise on Wednesday. Will fax exercise flow sheets to Julie Saunders office for review.

## 2015-01-02 ENCOUNTER — Telehealth (HOSPITAL_COMMUNITY): Payer: Self-pay | Admitting: *Deleted

## 2015-01-03 ENCOUNTER — Encounter (HOSPITAL_COMMUNITY): Payer: 59

## 2015-01-05 ENCOUNTER — Encounter (HOSPITAL_COMMUNITY): Payer: 59

## 2015-01-08 ENCOUNTER — Encounter (HOSPITAL_COMMUNITY): Payer: 59

## 2015-01-10 ENCOUNTER — Encounter (HOSPITAL_COMMUNITY)
Admission: RE | Admit: 2015-01-10 | Discharge: 2015-01-10 | Disposition: A | Discharge status: Home / Self Care | Payer: 59 | Source: Ambulatory Visit | Attending: Internal Medicine | Admitting: Internal Medicine

## 2015-01-10 ENCOUNTER — Telehealth (HOSPITAL_COMMUNITY): Payer: Self-pay | Admitting: *Deleted

## 2015-01-10 ENCOUNTER — Telehealth: Payer: Self-pay | Admitting: *Deleted

## 2015-01-10 DIAGNOSIS — Z95810 Presence of automatic (implantable) cardiac defibrillator: Secondary | ICD-10-CM | POA: Insufficient documentation

## 2015-01-10 NOTE — Telephone Encounter (Signed)
Spoke with Verdis Frederickson at cardiac rehab about issues patient was having with weight loss (per note on 01/01/15).  Patient concerned that it may be related to starting Inderal.  Patient has issues with Crohns also.  Verdis Frederickson tells me patient states she sees her PCP this Friday and her weight is stable.  She also has been referred to [facility] (possibly end of this month) to address GI issues.

## 2015-01-10 NOTE — Telephone Encounter (Signed)
-----   Message from Stanton Kidney, RN sent at 01/10/2015  8:25 AM EST ----- Regarding: RE: cardiac rehab Per Dr. Caryl Comes - ok to resume exercise.  ----- Message -----    From: Magda Kiel, RN    Sent: 01/08/2015   3:36 PM      To: Stanton Kidney, RN Subject: cardiac rehab                                  Good afternoon Sherri,  Checking to see if Dr Caryl Comes had a chance to look at Stephaney's blood pressures at cardiac rehab and when she can resume exercise?   Thanks for your help   Verdis Frederickson [phone]

## 2015-01-12 ENCOUNTER — Encounter (HOSPITAL_COMMUNITY)
Admission: RE | Admit: 2015-01-12 | Discharge: 2015-01-12 | Disposition: A | Discharge status: Home / Self Care | Payer: 59 | Source: Ambulatory Visit | Attending: Internal Medicine | Admitting: Internal Medicine

## 2015-01-12 DIAGNOSIS — Z95810 Presence of automatic (implantable) cardiac defibrillator: Secondary | ICD-10-CM | POA: Diagnosis not present

## 2015-01-15 ENCOUNTER — Other Ambulatory Visit: Payer: Self-pay | Admitting: Family Medicine

## 2015-01-15 ENCOUNTER — Encounter (HOSPITAL_COMMUNITY)
Admission: RE | Admit: 2015-01-15 | Discharge: 2015-01-15 | Disposition: A | Discharge status: Home / Self Care | Payer: 59 | Source: Ambulatory Visit | Attending: Internal Medicine | Admitting: Internal Medicine

## 2015-01-15 DIAGNOSIS — K8020 Calculus of gallbladder without cholecystitis without obstruction: Secondary | ICD-10-CM

## 2015-01-15 DIAGNOSIS — Z95810 Presence of automatic (implantable) cardiac defibrillator: Secondary | ICD-10-CM | POA: Diagnosis not present

## 2015-01-17 ENCOUNTER — Encounter (HOSPITAL_COMMUNITY)
Admission: RE | Admit: 2015-01-17 | Discharge: 2015-01-17 | Disposition: A | Discharge status: Home / Self Care | Payer: 59 | Source: Ambulatory Visit | Attending: Internal Medicine | Admitting: Internal Medicine

## 2015-01-17 DIAGNOSIS — Z95810 Presence of automatic (implantable) cardiac defibrillator: Secondary | ICD-10-CM | POA: Diagnosis not present

## 2015-01-19 ENCOUNTER — Encounter (HOSPITAL_COMMUNITY)
Admission: RE | Admit: 2015-01-19 | Discharge: 2015-01-19 | Disposition: A | Discharge status: Home / Self Care | Payer: 59 | Source: Ambulatory Visit | Attending: Internal Medicine | Admitting: Internal Medicine

## 2015-01-19 DIAGNOSIS — Z95810 Presence of automatic (implantable) cardiac defibrillator: Secondary | ICD-10-CM | POA: Diagnosis not present

## 2015-01-22 ENCOUNTER — Encounter (HOSPITAL_COMMUNITY)
Admission: RE | Admit: 2015-01-22 | Discharge: 2015-01-22 | Disposition: A | Discharge status: Home / Self Care | Payer: 59 | Source: Ambulatory Visit | Attending: Internal Medicine | Admitting: Internal Medicine

## 2015-01-22 DIAGNOSIS — Z95810 Presence of automatic (implantable) cardiac defibrillator: Secondary | ICD-10-CM | POA: Diagnosis not present

## 2015-01-24 ENCOUNTER — Encounter (HOSPITAL_COMMUNITY)
Admission: RE | Admit: 2015-01-24 | Discharge: 2015-01-24 | Disposition: A | Discharge status: Home / Self Care | Payer: 59 | Source: Ambulatory Visit | Attending: Internal Medicine | Admitting: Internal Medicine

## 2015-01-24 DIAGNOSIS — Z95810 Presence of automatic (implantable) cardiac defibrillator: Secondary | ICD-10-CM | POA: Diagnosis not present

## 2015-01-25 ENCOUNTER — Ambulatory Visit
Admission: RE | Admit: 2015-01-25 | Discharge: 2015-01-25 | Disposition: A | Discharge status: Home / Self Care | Payer: 59 | Source: Ambulatory Visit | Attending: Family Medicine | Admitting: Family Medicine

## 2015-01-25 ENCOUNTER — Telehealth: Payer: Self-pay | Admitting: *Deleted

## 2015-01-25 DIAGNOSIS — K8020 Calculus of gallbladder without cholecystitis without obstruction: Secondary | ICD-10-CM

## 2015-01-25 NOTE — Telephone Encounter (Addendum)
Patient called asking me to fax last echo to GI specialist in [facility]. She states that we have permission to fax any of her records if they request anything further. Send echo to [facility]: [phone]  Fax:  [phone]

## 2015-01-25 NOTE — Telephone Encounter (Signed)
Faxed requested information.

## 2015-01-26 ENCOUNTER — Encounter (HOSPITAL_COMMUNITY)
Admission: RE | Admit: 2015-01-26 | Discharge: 2015-01-26 | Disposition: A | Discharge status: Home / Self Care | Payer: 59 | Source: Ambulatory Visit | Attending: Internal Medicine | Admitting: Internal Medicine

## 2015-01-26 DIAGNOSIS — Z95810 Presence of automatic (implantable) cardiac defibrillator: Secondary | ICD-10-CM | POA: Diagnosis not present

## 2015-01-29 ENCOUNTER — Encounter (HOSPITAL_COMMUNITY)
Admission: RE | Admit: 2015-01-29 | Discharge: 2015-01-29 | Disposition: A | Discharge status: Home / Self Care | Payer: 59 | Source: Ambulatory Visit | Attending: Internal Medicine | Admitting: Internal Medicine

## 2015-01-29 DIAGNOSIS — Z95810 Presence of automatic (implantable) cardiac defibrillator: Secondary | ICD-10-CM | POA: Diagnosis not present

## 2015-01-29 NOTE — Progress Notes (Signed)
Julie Saunders 29 y.o. female Nutrition Note Spoke with pt. Nutrition Plan and Nutrition Survey goals reviewed with pt. Pt is not currently following the Therapeutic Lifestyle Changes diet due to N/V and decreased appetite. Pt has lost 6.3 kg (13.9 lb) over the past 2 months due to N/V. Pt wants to gain wt back "I can see my ribs." Pt has been trying to gain wt by "eating anything I want." Wt gain tips reviewed. Pt expressed understanding of the information reviewed. Pt aware of nutrition education classes offered. No results found for: HGBA1C Nutrition Diagnosis ? Food-and nutrition-related knowledge deficit related to lack of exposure to information as related to diagnosis of: ? CVD  ? Unintentional wt loss related to N/V and decreased food intake as evidenced by wt loss of 9.5% in 2 months.  Nutrition RX/ Estimated Daily Nutrition Needs for: wt  maintenance  1950-2250 Kcal, 65-75 gm fat, 13-15 gm sat fat, 1.9-2.2 gm trans-fat, <1500 mg sodium  Nutrition Intervention ? Pt's individual nutrition plan reviewed with pt. ? Benefits of adopting Therapeutic Lifestyle Changes discussed when Medficts reviewed. ? Pt to attend the Portion Distortion class - met; 01/10/15 ? Pt to attend the   ? Nutrition I class                  ? Nutrition II class - met; 12/26/14  ? Pt given handouts for: ? High Calorie, High Protein diet and recipes ? Continue client-centered nutrition education by RD, as part of interdisciplinary care. Goal(s) ? Pt to identify food quantities necessary to achieve: ? wt gain   Monitor and Evaluate progress toward nutrition goal with team. Nutrition Risk: Change to Moderate Derek Mound, M.Ed, RD, LDN, CDE 01/29/2015 3:46 PM

## 2015-01-31 ENCOUNTER — Encounter (HOSPITAL_COMMUNITY)
Admission: RE | Admit: 2015-01-31 | Discharge: 2015-01-31 | Disposition: A | Discharge status: Home / Self Care | Payer: 59 | Source: Ambulatory Visit | Attending: Internal Medicine | Admitting: Internal Medicine

## 2015-01-31 DIAGNOSIS — Z95810 Presence of automatic (implantable) cardiac defibrillator: Secondary | ICD-10-CM | POA: Diagnosis not present

## 2015-02-02 ENCOUNTER — Encounter (HOSPITAL_COMMUNITY)
Admission: RE | Admit: 2015-02-02 | Discharge: 2015-02-02 | Disposition: A | Discharge status: Home / Self Care | Payer: 59 | Source: Ambulatory Visit | Attending: Internal Medicine | Admitting: Internal Medicine

## 2015-02-02 DIAGNOSIS — Z95810 Presence of automatic (implantable) cardiac defibrillator: Secondary | ICD-10-CM | POA: Diagnosis not present

## 2015-02-05 ENCOUNTER — Encounter (HOSPITAL_COMMUNITY)
Admission: RE | Admit: 2015-02-05 | Discharge: 2015-02-05 | Disposition: A | Discharge status: Home / Self Care | Payer: 59 | Source: Ambulatory Visit | Attending: Internal Medicine | Admitting: Internal Medicine

## 2015-02-05 ENCOUNTER — Other Ambulatory Visit: Payer: Self-pay | Admitting: Internal Medicine

## 2015-02-05 DIAGNOSIS — Z95810 Presence of automatic (implantable) cardiac defibrillator: Secondary | ICD-10-CM | POA: Diagnosis not present

## 2015-02-07 ENCOUNTER — Encounter (HOSPITAL_COMMUNITY)
Admission: RE | Admit: 2015-02-07 | Discharge: 2015-02-07 | Disposition: A | Discharge status: Home / Self Care | Payer: 59 | Source: Ambulatory Visit | Attending: Internal Medicine | Admitting: Internal Medicine

## 2015-02-07 DIAGNOSIS — Z95810 Presence of automatic (implantable) cardiac defibrillator: Secondary | ICD-10-CM | POA: Diagnosis not present

## 2015-02-07 NOTE — Progress Notes (Signed)
Julie Saunders's weight was down 1.7 kg from Monday. Entry blood pressure 110/64. Patient's blood pressure noted at 98/60 on the airdyne. Patient asymptomatic and given Gatorade. Recheck blood pressure 87/48 on the treadmill. Patient complained of feeling lightheaded. Exercise stopped. Patient placed in chair and elevated feet. Recheck blood pressure 98/59. Patient drank some water, recheck blood pressure 100/68. Telemetry rhythm Sinus. [name] PAC called and notifed. [name] discontinued [name]'s aldactone. Recheck standing blood pressure 105/72. Patient left cardiac rehab without  Complaints or symptoms. Heart rate 69.

## 2015-02-07 NOTE — Telephone Encounter (Signed)
Called by Verdis Frederickson in cardiac rehab, pt was dizzy on treadmill. B/P low -"80 systolic". She was given po fluids and reclined. Her B/P now is stable. Last echo showed normalized EF. Her beta blocker had been increased by Dr Caryl Comes as an OP. I stopped the pt's Aldactone and asked her to push po fluids. She has had some anorexia- w/u in progress.   [name] PA-C 02/07/2015 4:16 PM

## 2015-02-08 ENCOUNTER — Telehealth: Payer: Self-pay | Admitting: *Deleted

## 2015-02-08 DIAGNOSIS — I959 Hypotension, unspecified: Secondary | ICD-10-CM

## 2015-02-08 NOTE — Telephone Encounter (Signed)
Julie Saunders from Cardiac Rehab calling stating Ashely's BP was low yesterday in rehab 80/ .  She notified [name] who d/c her Aldactone.  Lelan Pons wanted to know if Dr. Caryl Comes wanted her to have labs done to monitor her K+.  Spoke w/Dr. Caryl Comes who stated she needed to have BMET next week.  Called pt and scheduled for her to come in 4/5.  Notified Verdis Frederickson that she would be coming in next Tues.  She is going to fax BP's and EKG readings from yesterday over to Dr. Caryl Comes.

## 2015-02-09 ENCOUNTER — Encounter (HOSPITAL_COMMUNITY)
Admission: RE | Admit: 2015-02-09 | Discharge: 2015-02-09 | Disposition: A | Discharge status: Home / Self Care | Payer: 59 | Source: Ambulatory Visit | Attending: Internal Medicine | Admitting: Internal Medicine

## 2015-02-09 DIAGNOSIS — Z95810 Presence of automatic (implantable) cardiac defibrillator: Secondary | ICD-10-CM | POA: Diagnosis not present

## 2015-02-12 ENCOUNTER — Encounter (HOSPITAL_COMMUNITY)
Admission: RE | Admit: 2015-02-12 | Discharge: 2015-02-12 | Disposition: A | Discharge status: Home / Self Care | Payer: 59 | Source: Ambulatory Visit | Attending: Internal Medicine | Admitting: Internal Medicine

## 2015-02-12 DIAGNOSIS — Z95810 Presence of automatic (implantable) cardiac defibrillator: Secondary | ICD-10-CM | POA: Diagnosis not present

## 2015-02-12 NOTE — Progress Notes (Addendum)
Patient complained of feeling lightheaded today while walking on the track today at cardiac rehab. Blood pressure 103/48 sitting. Standing blood pressure 103/50.  Patient said she felt nauseated today at home. Julie Saunders PAC called and notified about her symptoms. No new orders received. Patient given ginger ale and graham crackers. Symptoms resolved. Julie Saunders says she has gallstones and has an appointment to see a surgeon in a few weeks. Will continue to monitor the patient throughout  the program. Julie Saunders has lost a total of 8.9 kg since starting cardiac rehab at the end of January and reports having a decreased appetite and nausea at home. Will notify Dr Caryl Comes and the patient's  Gastroenterologist Dr Michail Sermon. Will fax exercise flow sheets to Dr. Caryl Comes and Dr Kathline Magic office for review.

## 2015-02-13 ENCOUNTER — Other Ambulatory Visit (INDEPENDENT_AMBULATORY_CARE_PROVIDER_SITE_OTHER): Payer: 59 | Admitting: *Deleted

## 2015-02-13 DIAGNOSIS — I959 Hypotension, unspecified: Secondary | ICD-10-CM | POA: Diagnosis not present

## 2015-02-13 LAB — BASIC METABOLIC PANEL
BUN: 12 mg/dL (ref 6–23)
CALCIUM: 9 mg/dL (ref 8.4–10.5)
CHLORIDE: 102 meq/L (ref 96–112)
CO2: 30 mEq/L (ref 19–32)
Creatinine, Ser: 1.14 mg/dL (ref 0.40–1.20)
GFR: 60.36 mL/min (ref >60.00)
Glucose, Bld: 83 mg/dL (ref 70–99)
Potassium: 4.1 mEq/L (ref 3.5–5.1)
SODIUM: 136 meq/L (ref 135–145)

## 2015-02-14 ENCOUNTER — Encounter (HOSPITAL_COMMUNITY)
Admission: RE | Admit: 2015-02-14 | Discharge: 2015-02-14 | Disposition: A | Discharge status: Home / Self Care | Payer: 59 | Source: Ambulatory Visit | Attending: Internal Medicine | Admitting: Internal Medicine

## 2015-02-14 DIAGNOSIS — Z95810 Presence of automatic (implantable) cardiac defibrillator: Secondary | ICD-10-CM | POA: Diagnosis not present

## 2015-02-14 NOTE — Progress Notes (Signed)
Dr Caryl Comes and Dr Michail Sermon are aware of Anna's weight loss. No changes recommended in Somaya's current therapy. I did ask Fredericka today if she would benefit from counseling. Victoriana told me she will let me know.  No complaints during exercise this afternoon. Will continue to monitor the patient throughout  the program.

## 2015-02-16 ENCOUNTER — Telehealth (HOSPITAL_COMMUNITY): Payer: Self-pay | Admitting: *Deleted

## 2015-02-16 ENCOUNTER — Encounter (HOSPITAL_COMMUNITY): Payer: 59

## 2015-02-19 ENCOUNTER — Telehealth: Payer: Self-pay | Admitting: Internal Medicine

## 2015-02-19 ENCOUNTER — Encounter (HOSPITAL_COMMUNITY)
Admission: RE | Admit: 2015-02-19 | Discharge: 2015-02-19 | Disposition: A | Discharge status: Home / Self Care | Payer: 59 | Source: Ambulatory Visit | Attending: Internal Medicine | Admitting: Internal Medicine

## 2015-02-19 DIAGNOSIS — Z95810 Presence of automatic (implantable) cardiac defibrillator: Secondary | ICD-10-CM | POA: Diagnosis not present

## 2015-02-19 NOTE — Telephone Encounter (Signed)
New message     Patient calling returning call back to nurse from Thursday night.

## 2015-02-19 NOTE — Telephone Encounter (Signed)
Notified of lab results. 

## 2015-02-21 ENCOUNTER — Encounter (HOSPITAL_COMMUNITY)
Admission: RE | Admit: 2015-02-21 | Discharge: 2015-02-21 | Disposition: A | Discharge status: Home / Self Care | Payer: 59 | Source: Ambulatory Visit | Attending: Internal Medicine | Admitting: Internal Medicine

## 2015-02-21 DIAGNOSIS — Z95810 Presence of automatic (implantable) cardiac defibrillator: Secondary | ICD-10-CM | POA: Diagnosis not present

## 2015-02-23 ENCOUNTER — Encounter (HOSPITAL_COMMUNITY)
Admission: RE | Admit: 2015-02-23 | Discharge: 2015-02-23 | Disposition: A | Discharge status: Home / Self Care | Payer: 59 | Source: Ambulatory Visit | Attending: Internal Medicine | Admitting: Internal Medicine

## 2015-02-23 NOTE — Progress Notes (Signed)
Pt arrived at cardiac rehab c/o diarrhea yesterday and last night from CT abdomen contrast barium.  Pt reports diarrhea resolved today however continues to have abdominal soreness and cramping. Pt did not exercise. Pt instructed to increase PO fluid intake and eat bland diet until abdominal upset resolved.  Understanding verbalized

## 2015-02-26 ENCOUNTER — Encounter (HOSPITAL_COMMUNITY)
Admission: RE | Admit: 2015-02-26 | Discharge: 2015-02-26 | Disposition: A | Discharge status: Home / Self Care | Payer: 59 | Source: Ambulatory Visit | Attending: Internal Medicine | Admitting: Internal Medicine

## 2015-02-26 ENCOUNTER — Ambulatory Visit: Payer: Self-pay | Admitting: Surgery

## 2015-02-26 DIAGNOSIS — Z95810 Presence of automatic (implantable) cardiac defibrillator: Secondary | ICD-10-CM | POA: Diagnosis not present

## 2015-02-26 NOTE — H&P (Signed)
History of Present Illness Julie Saunders. Julie Asher MD; 02/26/2015 1:10 PM) Patient words: gallstones.  The patient is a 29 year old female who presents for evaluation of gall stones. Referred by Dr. Donnie Saunders for evaluation of gallstones Cardiology/ EP - [facility] - Dr. Lisbeth Saunders  This is a 29 year old female presents with a very extensive past medical history. She has a diagnosis for Crohn's disease and has been managed by Dr. Michail Saunders and recently referred to [facility]. She had a CT scan last week that showed approximately 35 cm of the distal ileum with wall thickening and submucosal enhancement consistent with active Crohn's disease. The patient has had persistent nausea with decreased appetite for the last several months. She has lost almost 50 pounds over the last 6 months. She does get occasional vomiting. She frequently has diarrhea but this is probably related to her Crohn's disease. The patient has had documented gallstones for more than 2 years. This was confirmed on a recent ultrasound. 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 Currently most of her symptoms are in her left lower quadrant. All recorded liver functions have been normal.  She is quite concerned about having any surgery at this time because of her recent cardiac event.    CLINICAL DATA: Initial evaluation right upper quadrant pain nausea vomiting for several months  EXAM: US ABDOMEN LIMITED - RIGHT UPPER QUADRANT  COMPARISON: 07/02/2012, 09/02/2014  FINDINGS: Gallbladder:  Numerous small gallstones the largest 5 mm. These appear mobile. There is no sonographic Murphy's sign. There is no wall thickening.  Common bile duct:  Diameter: 3 mm  Liver:  No focal lesion identified. Within normal limits in parenchymal echogenicity.  IMPRESSION: Cholelithiasis. Otherwise negative.   Electronically Signed By: Julie Saunders M.D. On:  01/25/2015 09:15 Other Problems (Julie Saunders, Julie Saunders; 02/26/2015 10:42 AM) Cholelithiasis Congestive Heart Failure Crohn's Disease Migraine Headache Transfusion history  Past Surgical History Julie Saunders, Julie Saunders; 02/26/2015 10:42 AM) Colon Polyp Removal - Colonoscopy Oral Surgery  Diagnostic Studies History Julie Saunders, Julie Saunders; 02/26/2015 10:42 AM) Colonoscopy within last year Mammogram never Pap Smear never  Allergies Julie Saunders, Julie Saunders; 02/26/2015 10:44 AM) No Known Drug Allergies 02/26/2015  Medication History (Julie Saunders, Julie Saunders; 02/26/2015 10:47 AM) Klor-Con M20 (20MEQ Tablet ER, Oral) Active. Pentasa (250MG Capsule ER, Oral) Active. Famotidine (20MG Tablet, Oral) Active. Hydrocodone-Acetaminophen (5-325MG Tablet, Oral as needed) Active. Promethazine HCl (25MG Tablet, Oral) Active. Propranolol HCl ER (120MG Capsule ER 24HR, Oral) Active. SUMAtriptan Succinate (100MG Tablet, Oral) Active. Spironolactone (25MG Tablet, Oral) Active. Medications Reconciled Align (4MG Capsule, Oral) Active.  Social History (Julie Saunders; 02/26/2015 10:42 AM) Alcohol use Occasional alcohol use. Caffeine use Carbonated beverages, Tea. No drug use Tobacco use Never smoker.  Family History Julie Saunders, Julie Saunders; 02/26/2015 10:42 AM) Arthritis Family Members In General. Diabetes Mellitus Family Members In General. Heart Disease Family Members In General. Heart disease in female family member before age 36 Hypertension Family Members In General, Mother. Kidney Disease Family Members In General. Melanoma Family Members In General. Migraine Headache Mother. Respiratory Condition Family Members In General.  Pregnancy / Birth History Julie Saunders, [place]; 02/26/2015 10:42 AM) Age at menarche 13 years. Irregular periods     Review of Systems (Julie Saunders; 02/26/2015 10:42 AM) General Present- Appetite Loss, Fatigue and Weight Loss. Not Present- Chills, Fever, Night  Sweats and Weight Gain. Skin Not Present- Change in Wart/Mole, Dryness, Hives, Jaundice, New Lesions, Non-Healing  Wounds, Rash and Ulcer. HEENT Present- Seasonal Allergies and Wears glasses/contact lenses. Not Present- Earache, Hearing Loss, Hoarseness, Nose Bleed, Oral Ulcers, Ringing in the Ears, Sinus Pain, Sore Throat, Visual Disturbances and Yellow Eyes. Breast Not Present- Breast Mass, Breast Pain, Nipple Discharge and Skin Changes. Cardiovascular Present- Shortness of Breath and Swelling of Extremities. Not Present- Chest Pain, Difficulty Breathing Lying Down, Leg Cramps, Palpitations and Rapid Heart Rate. Gastrointestinal Present- Abdominal Pain, Change in Bowel Habits, Nausea and Vomiting. Not Present- Bloating, Bloody Stool, Chronic diarrhea, Constipation, Difficulty Swallowing, Excessive gas, Gets full quickly at meals, Hemorrhoids, Indigestion and Rectal Pain. Female Genitourinary Not Present- Frequency, Nocturia, Painful Urination, Pelvic Pain and Urgency. Musculoskeletal Present- Muscle Weakness. Not Present- Back Pain, Joint Pain, Joint Stiffness, Muscle Pain and Swelling of Extremities. Neurological Present- Headaches and Weakness. Not Present- Decreased Memory, Fainting, Numbness, Seizures, Tingling, Tremor and Trouble walking. Psychiatric Not Present- Anxiety, Bipolar, Change in Sleep Pattern, Depression, Fearful and Frequent crying. Endocrine Present- Cold Intolerance and Hair Changes. Not Present- Excessive Hunger, Heat Intolerance, Hot flashes and New Diabetes. Hematology Not Present- Easy Bruising, Excessive bleeding, Gland problems, HIV and Persistent Infections.  Vitals (Julie Saunders Julie Saunders; 02/26/2015 10:43 AM) 02/26/2015 10:43 AM Weight: 136 lb Height: 65in Body Surface Area: 1.68 m Body Mass Index: 22.63 kg/m Temp.: 103F(Temporal)  Pulse: 75 (Regular)  BP: 126/81 (Sitting, Left Arm, Standard)     Physical Exam Julie Key K. Dywane Peruski MD; 02/26/2015 1:05 PM)  The  physical exam findings are as follows: Note:WDWN in NAD HEENT: EOMI, sclera anicteric Neck: No masses, no thyromegaly Lungs: CTA bilaterally; normal respiratory effort Chest - AICD in place CV: Regular rate and rhythm; no murmurs Abd: +bowel sounds, soft, significant visible striae, but the patient has a fairly scaphoid abdomen at this time. Mild LLQ tenderness. No RUQ tenderness Ext: Well-perfused; no edema Skin: Warm, dry; no sign of jaundice    Assessment & Plan Julie Key K. Antwaine Boomhower MD; 02/26/2015 1:07 PM)  CHRONIC CHOLECYSTITIS WITH CALCULUS (574.10  K80.10)  Current Plans Instructions: Please call and let us know if you wish to proceed with the surgery. Laparoscopic cholecystectomy with intraoperative cholangiogram Note:I feel that her nausea and anorexia are probably, but no definitely, related to her gallstones. I understand her reluctance to have surgery at this time because of her recent cardiac events. Her CT shows active Crohn's disease, so her treatment may need to be adjusted.   I am willing to proceed with laparoscopic cholecystectomy with intraoperative cholangiogram after cardiac clearance by Dr. Caryl Saunders, but the patient is a bit reluctant at this time. She has upcoming appointments with both GI doctors as well as Dr. Caryl Saunders, so she wants to discuss this with them before consenting to surgery. We will wait to hear back from her if she decides to have the surgery.   Julie Saunders. Georgette Dover, MD, [facility] Surgery  General/ Trauma Surgery  02/26/2015 1:10 PM

## 2015-02-27 ENCOUNTER — Telehealth: Payer: Self-pay | Admitting: Internal Medicine

## 2015-02-27 NOTE — Telephone Encounter (Signed)
Spoke with Ammie at [facility] and informed her that we did receive the clearance. Ammie states that she just needs to know if pt can be cleared from a cardiac standpoint or if she needs to be seen prior to clearance. Informed Ammie that Dr. Olin Pia nurse is out of the office today but I would route this information to her for review. Ammie verbalized understanding.

## 2015-02-27 NOTE — Telephone Encounter (Signed)
New Message    [facility] has faxed over surgical clearance on patient that needs to be return please give office a call. THanks.

## 2015-02-28 ENCOUNTER — Encounter (HOSPITAL_COMMUNITY)
Admission: RE | Admit: 2015-02-28 | Discharge: 2015-02-28 | Disposition: A | Discharge status: Home / Self Care | Payer: 59 | Source: Ambulatory Visit | Attending: Internal Medicine | Admitting: Internal Medicine

## 2015-02-28 DIAGNOSIS — Z95810 Presence of automatic (implantable) cardiac defibrillator: Secondary | ICD-10-CM | POA: Diagnosis not present

## 2015-03-01 NOTE — Telephone Encounter (Signed)
Routing to Dr. Caryl Comes to address

## 2015-03-02 ENCOUNTER — Encounter (HOSPITAL_COMMUNITY)
Admission: RE | Admit: 2015-03-02 | Discharge: 2015-03-02 | Disposition: A | Discharge status: Home / Self Care | Payer: 59 | Source: Ambulatory Visit | Attending: Internal Medicine | Admitting: Internal Medicine

## 2015-03-02 DIAGNOSIS — Z95810 Presence of automatic (implantable) cardiac defibrillator: Secondary | ICD-10-CM | POA: Diagnosis not present

## 2015-03-05 ENCOUNTER — Encounter (HOSPITAL_COMMUNITY)
Admission: RE | Admit: 2015-03-05 | Discharge: 2015-03-05 | Disposition: A | Discharge status: Home / Self Care | Payer: 59 | Source: Ambulatory Visit | Attending: Internal Medicine | Admitting: Internal Medicine

## 2015-03-05 DIAGNOSIS — Z95810 Presence of automatic (implantable) cardiac defibrillator: Secondary | ICD-10-CM | POA: Diagnosis not present

## 2015-03-05 NOTE — Telephone Encounter (Signed)
She is fine for surgery  She has ICD in place

## 2015-03-06 ENCOUNTER — Encounter: Payer: Self-pay | Admitting: Internal Medicine

## 2015-03-07 ENCOUNTER — Encounter (HOSPITAL_COMMUNITY)
Admission: RE | Admit: 2015-03-07 | Discharge: 2015-03-07 | Disposition: A | Discharge status: Home / Self Care | Payer: 59 | Source: Ambulatory Visit | Attending: Internal Medicine | Admitting: Internal Medicine

## 2015-03-07 DIAGNOSIS — Z95810 Presence of automatic (implantable) cardiac defibrillator: Secondary | ICD-10-CM | POA: Diagnosis not present

## 2015-03-07 NOTE — Telephone Encounter (Signed)
Informed Ammie at [facility] of Dr. Olin Pia approval for surgery.

## 2015-03-07 NOTE — Telephone Encounter (Signed)
lmtcb

## 2015-03-09 ENCOUNTER — Encounter (HOSPITAL_COMMUNITY)
Admission: RE | Admit: 2015-03-09 | Discharge: 2015-03-09 | Disposition: A | Discharge status: Home / Self Care | Payer: 59 | Source: Ambulatory Visit | Attending: Internal Medicine | Admitting: Internal Medicine

## 2015-03-09 DIAGNOSIS — Z95810 Presence of automatic (implantable) cardiac defibrillator: Secondary | ICD-10-CM | POA: Diagnosis not present

## 2015-03-12 ENCOUNTER — Encounter (HOSPITAL_COMMUNITY)
Admission: RE | Admit: 2015-03-12 | Discharge: 2015-03-12 | Disposition: A | Discharge status: Home / Self Care | Payer: 59 | Source: Ambulatory Visit | Attending: Internal Medicine | Admitting: Internal Medicine

## 2015-03-12 DIAGNOSIS — Z95810 Presence of automatic (implantable) cardiac defibrillator: Secondary | ICD-10-CM | POA: Insufficient documentation

## 2015-03-14 ENCOUNTER — Encounter (HOSPITAL_COMMUNITY)
Admission: RE | Admit: 2015-03-14 | Discharge: 2015-03-14 | Disposition: A | Discharge status: Home / Self Care | Payer: 59 | Source: Ambulatory Visit | Attending: Internal Medicine | Admitting: Internal Medicine

## 2015-03-14 DIAGNOSIS — Z95810 Presence of automatic (implantable) cardiac defibrillator: Secondary | ICD-10-CM | POA: Diagnosis not present

## 2015-03-16 ENCOUNTER — Encounter (HOSPITAL_COMMUNITY)
Admission: RE | Admit: 2015-03-16 | Discharge: 2015-03-16 | Disposition: A | Discharge status: Home / Self Care | Payer: 59 | Source: Ambulatory Visit | Attending: Internal Medicine | Admitting: Internal Medicine

## 2015-03-16 DIAGNOSIS — Z95810 Presence of automatic (implantable) cardiac defibrillator: Secondary | ICD-10-CM | POA: Diagnosis not present

## 2015-03-19 ENCOUNTER — Encounter (HOSPITAL_COMMUNITY)
Admission: RE | Admit: 2015-03-19 | Discharge: 2015-03-19 | Disposition: A | Discharge status: Home / Self Care | Payer: 59 | Source: Ambulatory Visit | Attending: Internal Medicine | Admitting: Internal Medicine

## 2015-03-19 DIAGNOSIS — Z95810 Presence of automatic (implantable) cardiac defibrillator: Secondary | ICD-10-CM | POA: Diagnosis not present

## 2015-03-21 ENCOUNTER — Encounter (HOSPITAL_COMMUNITY)
Admission: RE | Admit: 2015-03-21 | Discharge: 2015-03-21 | Disposition: A | Discharge status: Home / Self Care | Payer: 59 | Source: Ambulatory Visit | Attending: Internal Medicine | Admitting: Internal Medicine

## 2015-03-21 DIAGNOSIS — Z95810 Presence of automatic (implantable) cardiac defibrillator: Secondary | ICD-10-CM | POA: Diagnosis not present

## 2015-03-23 ENCOUNTER — Encounter (HOSPITAL_COMMUNITY)
Admission: RE | Admit: 2015-03-23 | Discharge: 2015-03-23 | Disposition: A | Discharge status: Home / Self Care | Payer: 59 | Source: Ambulatory Visit | Attending: Internal Medicine | Admitting: Internal Medicine

## 2015-03-23 ENCOUNTER — Encounter (HOSPITAL_COMMUNITY): Payer: Self-pay

## 2015-03-23 DIAGNOSIS — Z95810 Presence of automatic (implantable) cardiac defibrillator: Secondary | ICD-10-CM | POA: Diagnosis not present

## 2015-03-23 NOTE — Progress Notes (Signed)
Pt graduated from cardiac rehab program today with completion of 36 exercise sessions in Phase II. Pt maintained good attendance and progressed nicely during her participation in rehab as evidenced by increased MET level.   Medication list reconciled. Repeat  PHQ score-1.  Pt admits to occasional feelings of depression and inadequacy related to her illness and dependance on her father for transportation and care. However, pt does exhibit positive coping skills and hopeful outlook with supportive father who is available to assist her.  Pt is awaiting new treatment options for crohns disease and is somewhat anxious about this.   Pt has made significant lifestyle changes and should be commended for her success. Pt feels she has achieved his goals during cardiac rehab, specifically her increased energy, increased strength and stamina, abiliity to ambulate without use of cane and decreased dyspnea.  Pt is able to resume pleasurable social outings with her friends including shopping, bowling and minigolf.   Pt plans to continue exercising on her own at home.

## 2015-03-26 ENCOUNTER — Encounter (HOSPITAL_COMMUNITY): Payer: 59

## 2015-03-27 ENCOUNTER — Encounter: Payer: Self-pay | Admitting: Internal Medicine

## 2015-03-27 ENCOUNTER — Ambulatory Visit (INDEPENDENT_AMBULATORY_CARE_PROVIDER_SITE_OTHER): Payer: 59 | Admitting: Internal Medicine

## 2015-03-27 VITALS — BP 108/72 | HR 69 | Ht 65.0 in | Wt 127.0 lb

## 2015-03-27 DIAGNOSIS — R001 Bradycardia, unspecified: Secondary | ICD-10-CM

## 2015-03-27 DIAGNOSIS — R9431 Abnormal electrocardiogram [ECG] [EKG]: Secondary | ICD-10-CM

## 2015-03-27 DIAGNOSIS — Z4502 Encounter for adjustment and management of automatic implantable cardiac defibrillator: Secondary | ICD-10-CM | POA: Diagnosis not present

## 2015-03-27 DIAGNOSIS — I5022 Chronic systolic (congestive) heart failure: Secondary | ICD-10-CM

## 2015-03-27 DIAGNOSIS — I469 Cardiac arrest, cause unspecified: Secondary | ICD-10-CM | POA: Diagnosis not present

## 2015-03-27 DIAGNOSIS — I429 Cardiomyopathy, unspecified: Secondary | ICD-10-CM | POA: Diagnosis not present

## 2015-03-27 DIAGNOSIS — I471 Supraventricular tachycardia: Secondary | ICD-10-CM

## 2015-03-27 DIAGNOSIS — I4581 Long QT syndrome: Secondary | ICD-10-CM

## 2015-03-27 LAB — CUP PACEART INCLINIC DEVICE CHECK
Brady Statistic RA Percent Paced: 28 %
Brady Statistic RV Percent Paced: 0.47 %
Date Time Interrogation Session: 20160517160720
HighPow Impedance: 62 Ohm
Lead Channel Impedance Value: 587.5 Ohm
Lead Channel Pacing Threshold Amplitude: 0.5 V
Lead Channel Pacing Threshold Amplitude: 0.75 V
Lead Channel Pacing Threshold Pulse Width: 0.4 ms
Lead Channel Pacing Threshold Pulse Width: 0.4 ms
Lead Channel Sensing Intrinsic Amplitude: 11.8 mV
Lead Channel Setting Pacing Amplitude: 2 V
Lead Channel Setting Pacing Amplitude: 2.5 V
Lead Channel Setting Pacing Pulse Width: 0.4 ms
Lead Channel Setting Sensing Sensitivity: 0.5 mV
MDC IDC MSMT BATTERY REMAINING LONGEVITY: 96 mo
MDC IDC MSMT LEADCHNL RA PACING THRESHOLD AMPLITUDE: 0.5 V
MDC IDC MSMT LEADCHNL RA SENSING INTR AMPL: 5 mV
MDC IDC MSMT LEADCHNL RV IMPEDANCE VALUE: 337.5 Ohm
MDC IDC MSMT LEADCHNL RV PACING THRESHOLD AMPLITUDE: 0.75 V
MDC IDC MSMT LEADCHNL RV PACING THRESHOLD PULSEWIDTH: 0.4 ms
MDC IDC MSMT LEADCHNL RV PACING THRESHOLD PULSEWIDTH: 0.4 ms
MDC IDC PG SERIAL: 7179320
Zone Setting Detection Interval: 270 ms

## 2015-03-27 MED ORDER — POTASSIUM CHLORIDE CRYS ER 20 MEQ PO TBCR: EXTENDED_RELEASE_TABLET | ORAL | Status: DC

## 2015-03-27 MED ORDER — PROPRANOLOL HCL ER 120 MG PO CP24: 120.0000 mg | ORAL_CAPSULE | Freq: Every day | ORAL | Status: DC

## 2015-03-27 NOTE — Patient Instructions (Addendum)
Medication Instructions:  Your physician recommends that you continue on your current medications as directed. Please refer to the Current Medication list given to you today.  Labwork: None ordered  Testing/Procedures: None ordered  Follow-Up: Remote monitoring is used to monitor your Pacemaker of ICD from home. This monitoring reduces the number of office visits required to check your device to one time per year. It allows [place] to keep an eye on the functioning of your device to ensure it is working properly. You are scheduled for a device check from home on 06/26/15. You may send your transmission at any time that day. If you have a wireless device, the transmission will be sent automatically. After your physician reviews your transmission, you will receive a postcard with your next transmission date.  Your physician wants you to follow-up in: 6 months with Dr. Caryl Comes.  You will receive a reminder letter in the mail two months in advance. If you don't receive a letter, please call our office to schedule the follow-up appointment.  Claiborne Billings, RN will call you to arrange SVT ablation.   Thank you for choosing [facility]!!

## 2015-03-27 NOTE — Progress Notes (Signed)
Patient Care Team: L.Donnie Coffin, MD as PCP - General (Family Medicine)   HPI  Julie Saunders is a 29 y.o. female Seen in follow-up for aborted cardiac arrest.  No specific cause was identified. She was treated with beta blockers initially but these were stopped because of hypotension and dizziness. During cardiac rehabilitation she had an episode with ICD discharges. Dr. Greggory Brandy looked at these. I agree with his interpretation. 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 ICD discharge initially failed to terminate the SVT  She was re-started on her beta blockers and she has tolerated them well.  Last potassium level was 3.7. She is taking Aldactone  Past Medical History  Diagnosis Date  . Crohn disease   . Migraine   . Allergy   . Pancytopenia 11/23/2012  . Cardiac arrest   . Bradycardia     Past Surgical History  Procedure Laterality Date  . Implantable cardioverter defibrillator implant  09/12/2014    STJ dual chamber ICD implanted by Dr Caryl Comes for aborted cardiac arrest  . Electrophysiology study N/A 09/11/2014    Procedure: ELECTROPHYSIOLOGY STUDY;  Surgeon: Deboraha Sprang, MD;  Location[facility] CATH LAB;  Service: Cardiovascular;  Laterality: N/A;  . Implantable cardioverter defibrillator implant N/A 09/13/2014    Procedure: IMPLANTABLE CARDIOVERTER DEFIBRILLATOR IMPLANT;  Surgeon: Deboraha Sprang, MD;  Location: [facility] CATH LAB;  Service: Cardiovascular;  Laterality: N/A;    Current Outpatient Prescriptions  Medication Sig Dispense Refill  . acetaminophen (TYLENOL) 325 MG tablet Take 650 mg by mouth every 6 (six) hours as needed for headache.    . ALPRAZolam (XANAX) 0.25 MG tablet Take 0.25 mg by mouth 3 (three) times daily as needed for anxiety.    . cetirizine (ZYRTEC) 10 MG tablet Take 10 mg by mouth daily.    Marland Kitchen dimenhyDRINATE (DRAMAMINE) 50 MG tablet Take 50 mg by mouth daily as needed for dizziness.    . famotidine (PEPCID)  20 MG tablet Take 1 tablet (20 mg total) by mouth 2 (two) times daily. 60 tablet 0  . folic acid (FOLVITE) 237 MCG tablet Take 800 mcg by mouth daily.     Marland Kitchen HYDROcodone-acetaminophen (NORCO/VICODIN) 5-325 MG per tablet Take 1 tablet by mouth every 4 (four) hours as needed for moderate pain. 15 tablet 0  . KLOR-CON M20 20 MEQ tablet TAKE 1 TABLET (20 MEQ TOTAL) BY MOUTH DAILY. 30 tablet 3  . loperamide (IMODIUM A-D) 2 MG tablet Take 2 mg by mouth as needed for diarrhea or loose stools.    . mesalamine (PENTASA) 250 MG CR capsule Take 1,000 mg by mouth 4 (four) times daily.    . Pediatric Multiple Vit-C-FA (ANIMAL CHEWABLE MULTIVITAMIN PO) Take 1 tablet by mouth daily.    . Probiotic Product (ALIGN PO) Take 1 tablet by mouth daily.    . promethazine (PHENERGAN) 25 MG tablet Take 25 mg by mouth every 4 (four) hours as needed for nausea or vomiting.    . propranolol ER (INDERAL LA) 120 MG 24 hr capsule Take 1 capsule (120 mg total) by mouth daily. 30 capsule 3  . SUMAtriptan (IMITREX) 100 MG tablet Take 100 mg by mouth every 2 (two) hours as needed for migraine. May repeat in 2 hours if headache persists or recurs.     No current facility-administered medications for this visit.    Allergies  Allergen Reactions  . Sulfa Antibiotics Rash  . Sulfacetamide Sodium Rash  Review of Systems negative except from HPI and PMH  Physical Exam BP 108/72 mmHg  Pulse 69  Ht 5' 5"  (1.651 m)  Wt 127 lb (57.607 kg)  BMI 21.13 kg/m2 Well developed and well nourished in no acute distress HENT normal E scleral and icterus clear Neck Supple JVP flat; carotids brisk and full Clear to ausculation Device pocket well healed; without hematoma or erythema.  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 We will continue her propranolol 120   We hope to be able to redcuce the dose following ablation We have reviewed the issues of We discussed extensively the issues of   tthe physiology of orthstasis and positional stress.  We discussed the role of salt and water repletion, the importance of exercise, often needing to be started in the recumbent position, and the awareness of triggers and the role of ambient heat and dehydration .  Given the very rapid SVT, I would favor EP testing and catheter ablation of the substrate.  We will refer her to [name] for consideration of ablation of her long RP tachycardia  He and i had reviewed previosully

## 2015-03-28 ENCOUNTER — Encounter (HOSPITAL_COMMUNITY): Payer: 59

## 2015-03-30 ENCOUNTER — Encounter (HOSPITAL_COMMUNITY): Payer: 59

## 2015-04-02 ENCOUNTER — Encounter (HOSPITAL_COMMUNITY): Payer: 59

## 2015-04-04 ENCOUNTER — Encounter (HOSPITAL_COMMUNITY): Payer: 59

## 2015-04-06 ENCOUNTER — Encounter (HOSPITAL_COMMUNITY): Payer: 59

## 2015-04-16 ENCOUNTER — Encounter: Payer: Self-pay | Admitting: Internal Medicine

## 2015-04-16 ENCOUNTER — Ambulatory Visit (INDEPENDENT_AMBULATORY_CARE_PROVIDER_SITE_OTHER): Payer: 59 | Admitting: Internal Medicine

## 2015-04-16 VITALS — BP 88/48 | HR 70 | Ht 65.0 in | Wt 126.8 lb

## 2015-04-16 DIAGNOSIS — I5022 Chronic systolic (congestive) heart failure: Secondary | ICD-10-CM | POA: Diagnosis not present

## 2015-04-16 DIAGNOSIS — I471 Supraventricular tachycardia, unspecified: Secondary | ICD-10-CM | POA: Insufficient documentation

## 2015-04-16 DIAGNOSIS — I429 Cardiomyopathy, unspecified: Secondary | ICD-10-CM

## 2015-04-16 LAB — CUP PACEART INCLINIC DEVICE CHECK
HIGH POWER IMPEDANCE MEASURED VALUE: 59.625
Lead Channel Impedance Value: 312.5 Ohm
Lead Channel Impedance Value: 562.5 Ohm
Lead Channel Sensing Intrinsic Amplitude: 10.5 mV
Lead Channel Setting Pacing Amplitude: 2 V
Lead Channel Setting Pacing Amplitude: 2.5 V
Lead Channel Setting Pacing Pulse Width: 0.4 ms
Lead Channel Setting Sensing Sensitivity: 0.5 mV
MDC IDC MSMT BATTERY REMAINING LONGEVITY: 96 mo
MDC IDC MSMT LEADCHNL RA SENSING INTR AMPL: 5 mV
MDC IDC SESS DTM: 20160606123806
MDC IDC STAT BRADY RA PERCENT PACED: 21 %
MDC IDC STAT BRADY RV PERCENT PACED: 0.06 %
Pulse Gen Serial Number: 7179320
Zone Setting Detection Interval: 270 ms

## 2015-04-16 NOTE — Patient Instructions (Signed)
Medication Instructions:  Your physician recommends that you continue on your current medications as directed. Please refer to the Current Medication list given to you today.   Labwork: Your physician recommends that you return for lab work on ----   Testing/Procedures: Your physician has recommended that you have an ablation. Catheter ablation is a medical procedure used to treat some cardiac arrhythmias (irregular heartbeats). During catheter ablation, a long, thin, flexible tube is put into a blood vessel in your groin (upper thigh), or neck. This tube is called an ablation catheter. It is then guided to your heart through the blood vessel. Radio frequency waves destroy small areas of heart tissue where abnormal heartbeats may cause an arrhythmia to start. Please see the instruction sheet given to you today.    Follow-Up: Your physician recommends that you schedule a follow-up appointment will be scheduled for after the ablation   Any Other Special Instructions Will Be Listed Below (If Applicable).  Ablation dates are 7/29/ 8/2,8/4,8/16,8/18,8/19,8/23,8/25,8/30

## 2015-04-16 NOTE — Progress Notes (Signed)
Electrophysiology Office Note   Date:  04/16/2015   ID:  Julie Saunders, DOB Sep 11, 1986, MRN [id number]  PCP:  Donnie Coffin, MD   Primary Electrophysiologist: Dr Caryl Comes   Chief Complaint  Patient presents with  . SVT     History of Present Illness: Julie Saunders is a 29 y.o. female who presents today for electrophysiology evaluation.   The patient was seen be me after an ICD shock 12/08/14.  She was walking to cardiac rehab at [facility] at the time.  Device interrogation reveals probably atrial tachycardia with CL 250 msec with 1:1 AV conduction.  She has been on propranolol since that time.  Though she has not had any additional arrhythmias, she does note some fatigue.  Today, she denies symptoms of palpitations, chest pain, shortness of breath, orthopnea, PND, lower extremity edema, claudication, dizziness, presyncope, syncope, bleeding, or neurologic sequela. The patient is tolerating medications without difficulties and is otherwise without complaint today.    Past Medical History  Diagnosis Date  . Crohn disease   . Migraine   . Allergy   . Pancytopenia 11/23/2012  . Cardiac arrest   . Bradycardia   . SVT (supraventricular tachycardia) 12/08/2014    likely an atrial tachycardia with CL 250 msec   Past Surgical History  Procedure Laterality Date  . Implantable cardioverter defibrillator implant  09/12/2014    STJ dual chamber ICD implanted by Dr Caryl Comes for aborted cardiac arrest  . Electrophysiology study N/A 09/11/2014    Procedure: ELECTROPHYSIOLOGY STUDY;  Surgeon: Deboraha Sprang, MD;  Location: [facility] CATH LAB;  Service: Cardiovascular;  Laterality: N/A;  . Implantable cardioverter defibrillator implant N/A 09/13/2014    Procedure: IMPLANTABLE CARDIOVERTER DEFIBRILLATOR IMPLANT;  Surgeon: Deboraha Sprang, MD;  Location: [facility] CATH LAB;  Service: Cardiovascular;  Laterality: N/A;     Current Outpatient Prescriptions  Medication Sig Dispense Refill  . acetaminophen (TYLENOL)  325 MG tablet Take 650 mg by mouth every 6 (six) hours as needed for headache.    . ALPRAZolam (XANAX) 0.25 MG tablet Take 0.25 mg by mouth 3 (three) times daily as needed for anxiety.    . cetirizine (ZYRTEC) 10 MG tablet Take 10 mg by mouth daily.    Marland Kitchen dimenhyDRINATE (DRAMAMINE) 50 MG tablet Take 50 mg by mouth daily as needed for dizziness.    . famotidine (PEPCID) 20 MG tablet Take 1 tablet (20 mg total) by mouth 2 (two) times daily. 60 tablet 0  . folic acid (FOLVITE) 093 MCG tablet Take 800 mcg by mouth daily.     Marland Kitchen HYDROcodone-acetaminophen (NORCO/VICODIN) 5-325 MG per tablet Take 1 tablet by mouth every 4 (four) hours as needed for moderate pain. 15 tablet 0  . loperamide (IMODIUM A-D) 2 MG tablet Take 2 mg by mouth daily as needed for diarrhea or loose stools.     . mesalamine (PENTASA) 250 MG CR capsule Take 1,000 mg by mouth 4 (four) times daily.    . Pediatric Multiple Vit-C-FA (ANIMAL CHEWABLE MULTIVITAMIN PO) Take 1 tablet by mouth daily.    . potassium chloride SA (KLOR-CON M20) 20 MEQ tablet TAKE 1 TABLET (20 MEQ TOTAL) BY MOUTH DAILY. 30 tablet 6  . Probiotic Product (ALIGN PO) Take 1 tablet by mouth daily.    . promethazine (PHENERGAN) 25 MG tablet Take 25 mg by mouth every 4 (four) hours as needed for nausea or vomiting.    . propranolol ER (INDERAL LA) 120 MG 24 hr capsule Take  1 capsule (120 mg total) by mouth daily. 30 capsule 6  . SUMAtriptan (IMITREX) 100 MG tablet Take 100 mg by mouth every 2 (two) hours as needed for migraine. May repeat in 2 hours if headache persists or recurs.     No current facility-administered medications for this visit.    Allergies:   Sulfa antibiotics and Sulfacetamide sodium   Social History:  The patient  reports that she has never smoked. She does not have any smokeless tobacco history on file. She reports that she drinks about 0.5 oz of alcohol per week. She reports that she does not use illicit drugs.   Family History:  The patient's  family history includes Asthma in her paternal grandmother; Diabetes in her maternal grandfather and paternal grandfather; Heart attack in her maternal grandmother and mother.    ROS:  Please see the history of present illness.   All other systems are reviewed and negative.    PHYSICAL EXAM: VS:  BP 88/48 mmHg  Pulse 70  Ht 5' 5"  (1.651 m)  Wt 57.516 kg (126 lb 12.8 oz)  BMI 21.10 kg/m2 , BMI Body mass index is 21.1 kg/(m^2). GEN: Well nourished, well developed, in no acute distress HEENT: normal Neck: no JVD, carotid bruits, or masses Cardiac: RRR; no murmurs, rubs, or gallops,no edema  Respiratory:  clear to auscultation bilaterally, normal work of breathing GI: soft, nontender, nondistended, + BS MS: no deformity or atrophy Skin: warm and dry, device pocket is well healed Neuro:  Strength and sensation are intact Psych: euthymic mood, full affect  EKG:  EKG 03/27/15 is reviewed  Device interrogation is reviewed today in detail.  See PaceArt for details.   Recent Labs: 09/06/2014: Pro B Natriuretic peptide (BNP) 43778.0* 12/08/2014: ALT 11; Hemoglobin 11.5*; Magnesium 1.8; Platelets 314; TSH 1.053 02/13/2015: BUN 12; Creatinine 1.14; Potassium 4.1; Sodium 136    Lipid Panel  No results found for: CHOL, TRIG, HDL, CHOLHDL, VLDL, LDLCALC, LDLDIRECT   Wt Readings from Last 3 Encounters:  04/16/15 57.516 kg (126 lb 12.8 oz)  03/27/15 57.607 kg (127 lb)  12/19/14 63.322 kg (139 lb 9.6 oz)      Other studies Reviewed: Additional studies/ records that were reviewed today include: Dr Aquilla Hacker notes are reviewed   ASSESSMENT AND PLAN:  1.  SVT Likely an atrial tachycardia with CL 250 msec and 1:1 AV conduction.  Strips are again reviewed today in EPIC from 2/16 device interrogation. Therapeutic strategies for supraventricular tachycardia including medicine and ablation were discussed in detail with the patient today. Risk, benefits, and alternatives to EP study and  radiofrequency ablation were also discussed in detail today. These risks include but are not limited to stroke, bleeding, vascular damage, tamponade, perforation, damage to the heart and other structures, AV block,  ICD lead dislodgement requiring revision, worsening renal function, and death. The patient understands these risk and wishes to proceed.  We will therefore proceed with catheter ablation at the next available time. This procedure will require Carto, ICE, and anesthesia. Hold propranolol for 3 days prior to the procedure.  2. S/p cardiac arrest Normal ICD function See Pace Art report No changes today   Current medicines are reviewed at length with the patient today.   The patient does not have concerns regarding her medicines.  The following changes were made today:  none  Signed, Thompson Grayer, MD  04/16/2015 9:48 PM     [place] [id number] [phone] (  office) [phone] (fax)

## 2015-04-18 ENCOUNTER — Telehealth: Payer: Self-pay | Admitting: Internal Medicine

## 2015-04-18 DIAGNOSIS — I471 Supraventricular tachycardia: Secondary | ICD-10-CM

## 2015-04-18 NOTE — Telephone Encounter (Signed)
New message      FYI July 29 is a good day to do the ablation if it is a good day with the doctor

## 2015-04-19 NOTE — Telephone Encounter (Signed)
lmom for patient that I have her scheduled for 06/08/15 at 7:30am  She will need to come to the office on 06/01/15 for labs.  I will give her an instruction sheet then.  She will need to hold Propanolol for 48 hours prior to the ablation

## 2015-04-20 ENCOUNTER — Encounter: Payer: Self-pay | Admitting: Internal Medicine

## 2015-04-20 NOTE — Telephone Encounter (Signed)
Pt called and was given message-pt verbalized understanding and was told call if any questions

## 2015-04-23 ENCOUNTER — Encounter: Payer: Self-pay | Admitting: Internal Medicine

## 2015-04-25 ENCOUNTER — Encounter: Payer: Self-pay | Admitting: Internal Medicine

## 2015-05-07 ENCOUNTER — Other Ambulatory Visit: Payer: Self-pay | Admitting: *Deleted

## 2015-05-07 ENCOUNTER — Encounter: Payer: Self-pay | Admitting: *Deleted

## 2015-05-28 ENCOUNTER — Telehealth: Payer: Self-pay | Admitting: Cardiology

## 2015-05-28 DIAGNOSIS — I471 Supraventricular tachycardia: Secondary | ICD-10-CM

## 2015-05-28 NOTE — Telephone Encounter (Signed)
Pt called and stated that her ICD shocked her. I instructed pt to send a remote transmission. Pt stated that she feels fine, no chest pain, no shortness of breath. Transmission received. Call forwarded to device clinic nurse.

## 2015-05-28 NOTE — Telephone Encounter (Signed)
Spoke with patient at 3:40- patient denies dizziness, weakness, SOB or chest pain but she is anxious. Transmission reviewed- EGM appears to be AF with RVR into an 1:1 SVT at 240bpm. Patient reports that she has not taken her medications today due to nausea- instructed to take medications with some food. I will call back later for repeat transmission to view HR as patient is unable to report her heart rate over the phone. Instructed that if she develops SOB, chest pain, lightheadedness, or if she receives another shock to go to the ER and she should not drive. She verbalized understanding.

## 2015-05-28 NOTE — Telephone Encounter (Signed)
She is feeling much better- less anxious and still denying other sx. Repeat transmission shows 1:1 rhythm with PVCs rate averaging around 100bpm. Will review episode with Dr. Caryl Comes tomorrow. Reiterated shock plan with patient. She verbalizes understanding.

## 2015-05-28 NOTE — Telephone Encounter (Signed)
LMOM to send repeat transmission and to call back to device clinic.

## 2015-05-29 MED ORDER — PROPRANOLOL HCL ER 60 MG PO CP24: 180.0000 mg | ORAL_CAPSULE | Freq: Every day | ORAL | Status: DC

## 2015-05-29 NOTE — Telephone Encounter (Signed)
Reviewed episode with Dr. Caryl Comes. Inderal increased from 120mg  daily to 180mg  daily. Appt made for 12:30pm with device clinic to change VF zone from 240 to 250 per SK. Patient verbalizes understanding.

## 2015-05-29 NOTE — Addendum Note (Signed)
Addended by: Ranee Gosselin on: 05/29/2015 02:52 PM   Modules accepted: Orders

## 2015-06-01 ENCOUNTER — Other Ambulatory Visit (INDEPENDENT_AMBULATORY_CARE_PROVIDER_SITE_OTHER): Payer: 59 | Admitting: *Deleted

## 2015-06-01 ENCOUNTER — Ambulatory Visit (INDEPENDENT_AMBULATORY_CARE_PROVIDER_SITE_OTHER): Payer: 59 | Admitting: *Deleted

## 2015-06-01 DIAGNOSIS — I471 Supraventricular tachycardia: Secondary | ICD-10-CM

## 2015-06-01 DIAGNOSIS — I5022 Chronic systolic (congestive) heart failure: Secondary | ICD-10-CM

## 2015-06-01 DIAGNOSIS — I429 Cardiomyopathy, unspecified: Secondary | ICD-10-CM

## 2015-06-01 LAB — CUP PACEART INCLINIC DEVICE CHECK
Date Time Interrogation Session: 20160722143617
Lead Channel Setting Pacing Amplitude: 2 V
Lead Channel Setting Sensing Sensitivity: 0.5 mV
MDC IDC SET LEADCHNL RV PACING AMPLITUDE: 2.5 V
MDC IDC SET LEADCHNL RV PACING PULSEWIDTH: 0.4 ms
MDC IDC SET ZONE DETECTION INTERVAL: 270 ms
Pulse Gen Serial Number: 7179320

## 2015-06-01 LAB — CBC WITH DIFFERENTIAL/PLATELET
Basophils Absolute: 0.1 10*3/uL (ref 0.0–0.1)
Basophils Relative: 0.8 % (ref 0.0–3.0)
EOS ABS: 0.3 10*3/uL (ref 0.0–0.7)
Eosinophils Relative: 2.7 % (ref 0.0–5.0)
HCT: 37.3 % (ref 36.0–46.0)
Hemoglobin: 12.2 g/dL (ref 12.0–15.0)
Lymphocytes Relative: 34 % (ref 12.0–46.0)
Lymphs Abs: 3.3 10*3/uL (ref 0.7–4.0)
MCHC: 32.8 g/dL (ref 30.0–36.0)
MCV: 89.8 fl (ref 78.0–100.0)
Monocytes Absolute: 1.2 10*3/uL — ABNORMAL HIGH (ref 0.1–1.0)
Monocytes Relative: 12.1 % — ABNORMAL HIGH (ref 3.0–12.0)
NEUTROS ABS: 4.9 10*3/uL (ref 1.4–7.7)
Neutrophils Relative %: 50.4 % (ref 43.0–77.0)
Platelets: 366 10*3/uL (ref 150.0–400.0)
RBC: 4.16 Mil/uL (ref 3.87–5.11)
RDW: 13.3 % (ref 11.5–15.5)
WBC: 9.8 10*3/uL (ref 4.0–10.5)

## 2015-06-01 LAB — BASIC METABOLIC PANEL
BUN: 10 mg/dL (ref 6–23)
CO2: 28 meq/L (ref 19–32)
Calcium: 8.3 mg/dL — ABNORMAL LOW (ref 8.4–10.5)
Chloride: 102 mEq/L (ref 96–112)
Creatinine, Ser: 0.97 mg/dL (ref 0.40–1.20)
GFR: 72.57 mL/min (ref >60.00)
Glucose, Bld: 79 mg/dL (ref 70–99)
POTASSIUM: 3.4 meq/L — AB (ref 3.5–5.1)
Sodium: 136 mEq/L (ref 135–145)

## 2015-06-01 NOTE — Progress Notes (Signed)
ICD check in clinic to increase detection rate for VF zone. Rate increased from 222bpm to 250bpm. No episodes recorded since 7/18. Patient to f/u w/SVT ablation on 7/29 and with SK after procedure.

## 2015-06-02 LAB — HCG, SERUM, QUALITATIVE: PREG SERUM: NEGATIVE

## 2015-06-08 ENCOUNTER — Ambulatory Visit (HOSPITAL_COMMUNITY): Payer: 59 | Admitting: Certified Registered Nurse Anesthetist

## 2015-06-08 ENCOUNTER — Ambulatory Visit (HOSPITAL_COMMUNITY)
Admission: RE | Admit: 2015-06-08 | Discharge: 2015-06-09 | Disposition: A | Discharge status: Home / Self Care | Payer: 59 | Source: Ambulatory Visit | Attending: Internal Medicine | Admitting: Internal Medicine

## 2015-06-08 ENCOUNTER — Encounter (HOSPITAL_COMMUNITY): Payer: Self-pay | Admitting: Certified Registered"

## 2015-06-08 ENCOUNTER — Encounter (HOSPITAL_COMMUNITY)
Admission: RE | Disposition: A | Discharge status: Home / Self Care | Payer: 59 | Source: Ambulatory Visit | Attending: Internal Medicine

## 2015-06-08 DIAGNOSIS — I471 Supraventricular tachycardia: Secondary | ICD-10-CM | POA: Diagnosis not present

## 2015-06-08 DIAGNOSIS — K5090 Crohn's disease, unspecified, without complications: Secondary | ICD-10-CM | POA: Diagnosis not present

## 2015-06-08 DIAGNOSIS — Z8679 Personal history of other diseases of the circulatory system: Secondary | ICD-10-CM

## 2015-06-08 DIAGNOSIS — Z9889 Other specified postprocedural states: Secondary | ICD-10-CM

## 2015-06-08 HISTORY — DX: Major depressive disorder, single episode, unspecified: F32.9

## 2015-06-08 HISTORY — DX: Personal history of other diseases of the circulatory system: Z98.890

## 2015-06-08 HISTORY — PX: ELECTROPHYSIOLOGIC STUDY: SHX172A

## 2015-06-08 HISTORY — DX: Personal history of other diseases of the circulatory system: Z86.79

## 2015-06-08 HISTORY — DX: Personal history of other medical treatment: Z92.89

## 2015-06-08 HISTORY — DX: Presence of automatic (implantable) cardiac defibrillator: Z95.810

## 2015-06-08 HISTORY — DX: Other specified postprocedural states: Z98.890

## 2015-06-08 HISTORY — DX: Depression, unspecified: F32.A

## 2015-06-08 LAB — POCT ACTIVATED CLOTTING TIME
ACTIVATED CLOTTING TIME: 208 s
Activated Clotting Time: 233 seconds
Activated Clotting Time: 177 seconds

## 2015-06-08 LAB — POTASSIUM: Potassium: 3.6 mmol/L (ref 3.5–5.1)

## 2015-06-08 LAB — PREGNANCY, URINE: Preg Test, Ur: NEGATIVE

## 2015-06-08 SURGERY — A-FLUTTER/A-TACH/SVT ABLATION
Anesthesia: Monitor Anesthesia Care

## 2015-06-08 MED ORDER — HYDROCODONE-ACETAMINOPHEN 5-325 MG PO TABS: 1.0000 | ORAL_TABLET | ORAL | Status: DC | PRN

## 2015-06-08 MED ORDER — ONDANSETRON HCL 4 MG/2ML IJ SOLN: 4.0000 mg | Freq: Four times a day (QID) | INTRAMUSCULAR | Status: DC | PRN

## 2015-06-08 MED ORDER — IOHEXOL 350 MG/ML SOLN
INTRAVENOUS | Status: DC | PRN
  Administered 2015-06-08: 5 mL via INTRAVENOUS

## 2015-06-08 MED ORDER — MESALAMINE ER 250 MG PO CPCR
1000.0000 mg | ORAL_CAPSULE | Freq: Once | ORAL | Status: AC
  Administered 2015-06-09: 1000 mg via ORAL
  Filled 2015-06-08: qty 4

## 2015-06-08 MED ORDER — FENTANYL CITRATE (PF) 100 MCG/2ML IJ SOLN: 25.0000 ug | INTRAMUSCULAR | Status: DC | PRN

## 2015-06-08 MED ORDER — SODIUM CHLORIDE 0.9 % IJ SOLN: 3.0000 mL | INTRAMUSCULAR | Status: DC | PRN

## 2015-06-08 MED ORDER — ALPRAZOLAM 0.25 MG PO TABS: 0.2500 mg | ORAL_TABLET | Freq: Three times a day (TID) | ORAL | Status: DC | PRN

## 2015-06-08 MED ORDER — LIDOCAINE HCL (CARDIAC) 20 MG/ML IV SOLN
INTRAVENOUS | Status: DC | PRN
  Administered 2015-06-08: 40 mg via INTRAVENOUS

## 2015-06-08 MED ORDER — SODIUM CHLORIDE 0.9 % IJ SOLN
3.0000 mL | Freq: Two times a day (BID) | INTRAMUSCULAR | Status: DC
  Administered 2015-06-08 (×2): 3 mL via INTRAVENOUS

## 2015-06-08 MED ORDER — SODIUM CHLORIDE 0.9 % IV SOLN
2.0000 ug/min | INTRAVENOUS | Status: AC
  Administered 2015-06-08: 2 ug/min via INTRAVENOUS
  Filled 2015-06-08: qty 2

## 2015-06-08 MED ORDER — PROMETHAZINE HCL 25 MG PO TABS: 25.0000 mg | ORAL_TABLET | ORAL | Status: DC | PRN

## 2015-06-08 MED ORDER — DEXTROSE 5 % IV SOLN
2.0000 ug/min | INTRAVENOUS | Status: DC
  Filled 2015-06-08: qty 5

## 2015-06-08 MED ORDER — MIDAZOLAM HCL 5 MG/5ML IJ SOLN
INTRAMUSCULAR | Status: DC | PRN
  Administered 2015-06-08 (×2): 1 mg via INTRAVENOUS

## 2015-06-08 MED ORDER — LACTATED RINGERS IV SOLN
INTRAVENOUS | Status: DC | PRN
  Administered 2015-06-08: 08:00:00 via INTRAVENOUS

## 2015-06-08 MED ORDER — ENSURE ENLIVE PO LIQD: 237.0000 mL | Freq: Two times a day (BID) | ORAL | Status: DC

## 2015-06-08 MED ORDER — ONDANSETRON HCL 4 MG/2ML IJ SOLN
INTRAMUSCULAR | Status: DC | PRN
  Administered 2015-06-08: 4 mg via INTRAVENOUS

## 2015-06-08 MED ORDER — FAMOTIDINE 20 MG PO TABS
20.0000 mg | ORAL_TABLET | Freq: Two times a day (BID) | ORAL | Status: DC
  Administered 2015-06-08 – 2015-06-09 (×2): 20 mg via ORAL
  Filled 2015-06-08 (×4): qty 1

## 2015-06-08 MED ORDER — POTASSIUM CHLORIDE CRYS ER 10 MEQ PO TBCR
10.0000 meq | EXTENDED_RELEASE_TABLET | Freq: Every day | ORAL | Status: DC
  Administered 2015-06-09: 10 meq via ORAL
  Filled 2015-06-08: qty 1

## 2015-06-08 MED ORDER — HYDROCODONE-ACETAMINOPHEN 5-325 MG PO TABS
1.0000 | ORAL_TABLET | ORAL | Status: DC | PRN
  Administered 2015-06-08 – 2015-06-09 (×2): 1 via ORAL
  Filled 2015-06-08 (×2): qty 1

## 2015-06-08 MED ORDER — SODIUM CHLORIDE 0.9 % IV SOLN: 250.0000 mL | INTRAVENOUS | Status: DC | PRN

## 2015-06-08 MED ORDER — HEPARIN SODIUM (PORCINE) 1000 UNIT/ML IJ SOLN
INTRAMUSCULAR | Status: DC | PRN
  Administered 2015-06-08: 10000 [IU] via INTRAVENOUS

## 2015-06-08 MED ORDER — HEPARIN SODIUM (PORCINE) 1000 UNIT/ML IJ SOLN
INTRAMUSCULAR | Status: AC
  Filled 2015-06-08: qty 1

## 2015-06-08 MED ORDER — PROPOFOL 10 MG/ML IV BOLUS
INTRAVENOUS | Status: DC | PRN
  Administered 2015-06-08 (×2): 20 mg via INTRAVENOUS
  Administered 2015-06-08: 30 mg via INTRAVENOUS
  Administered 2015-06-08 (×2): 20 mg via INTRAVENOUS

## 2015-06-08 MED ORDER — FOLIC ACID 1 MG PO TABS
1.0000 mg | ORAL_TABLET | Freq: Every day | ORAL | Status: DC
  Administered 2015-06-09: 1 mg via ORAL
  Filled 2015-06-08: qty 1

## 2015-06-08 MED ORDER — MESALAMINE ER 250 MG PO CPCR
1000.0000 mg | ORAL_CAPSULE | Freq: Four times a day (QID) | ORAL | Status: DC
  Administered 2015-06-09: 1000 mg via ORAL
  Filled 2015-06-08 (×4): qty 4

## 2015-06-08 MED ORDER — ACETAMINOPHEN 325 MG PO TABS: 650.0000 mg | ORAL_TABLET | ORAL | Status: DC | PRN

## 2015-06-08 MED ORDER — PROPOFOL INFUSION 10 MG/ML OPTIME
INTRAVENOUS | Status: DC | PRN
  Administered 2015-06-08: 50 ug/kg/min via INTRAVENOUS

## 2015-06-08 MED ORDER — BUPIVACAINE HCL (PF) 0.25 % IJ SOLN
INTRAMUSCULAR | Status: AC
  Filled 2015-06-08: qty 30

## 2015-06-08 MED ORDER — FENTANYL CITRATE (PF) 100 MCG/2ML IJ SOLN
INTRAMUSCULAR | Status: DC | PRN
  Administered 2015-06-08 (×5): 25 ug via INTRAVENOUS

## 2015-06-08 SURGICAL SUPPLY — 24 items
BAG SNAP BAND KOVER 36X36 (MISCELLANEOUS) ×2 IMPLANT
BLANKET WARM UNDERBOD FULL ACC (MISCELLANEOUS) ×2 IMPLANT
CATH DIAG 6FR PIGTAIL (CATHETERS) ×1 IMPLANT
CATH JOSEPH QUAD ALLRED 6F REP (CATHETERS) ×2 IMPLANT
CATH NAVISTAR SMARTTOUCH DF (ABLATOR) ×2 IMPLANT
CATH SOUNDSTAR 3D IMAGING (CATHETERS) ×2 IMPLANT
CATH WEBSTER BI DIR CS D-F CRV (CATHETERS) ×2 IMPLANT
COVER SWIFTLINK CONNECTOR (BAG) ×2 IMPLANT
NDL TRANSEP BRK 71CM 407200 (NEEDLE) ×1 IMPLANT
NEEDLE TRANSEP BRK 71CM 407200 (NEEDLE) ×2 IMPLANT
PACK EP LATEX FREE (CUSTOM PROCEDURE TRAY) ×2
PACK EP LF (CUSTOM PROCEDURE TRAY) ×1 IMPLANT
PAD DEFIB LIFELINK (PAD) ×2 IMPLANT
PATCH CARTO3 (PAD) ×2 IMPLANT
SHEATH AVANTI 11F 11CM (SHEATH) ×2 IMPLANT
SHEATH PINNACLE 6F 10CM (SHEATH) ×2 IMPLANT
SHEATH PINNACLE 7F 10CM (SHEATH) ×2 IMPLANT
SHEATH PINNACLE 8F 10CM (SHEATH) ×2 IMPLANT
SHEATH PINNACLE 9F 10CM (SHEATH) ×2 IMPLANT
SHEATH SWARTZ TS SL2 63CM 8.5F (SHEATH) ×2 IMPLANT
SHIELD RADPAD SCOOP 12X17 (MISCELLANEOUS) ×2 IMPLANT
SYR MEDRAD MARK V 150ML (SYRINGE) ×1 IMPLANT
TUBING CONTRAST HIGH PRESS 48 (TUBING) ×1 IMPLANT
TUBING SMART ABLATE COOLFLOW (TUBING) ×2 IMPLANT

## 2015-06-08 NOTE — Anesthesia Postprocedure Evaluation (Signed)
  Anesthesia Post-op Note  Patient: Julie Saunders  Procedure(s) Performed: Procedure(s): SVT Ablation (N/A)  Patient Location: PACU  Anesthesia Type:MAC  Level of Consciousness: awake  Airway and Oxygen Therapy: Patient Spontanous Breathing  Post-op Pain: none  Post-op Assessment: Post-op Vital signs reviewed, Patient's Cardiovascular Status Stable, Respiratory Function Stable, Patent Airway, No signs of Nausea or vomiting and Pain level controlled              Post-op Vital Signs: Reviewed and stable  Last Vitals:  Filed Vitals:   06/08/15 1500  BP: 95/68  Pulse: 74  Temp:   Resp: 16    Complications: No apparent anesthesia complications

## 2015-06-08 NOTE — Anesthesia Preprocedure Evaluation (Addendum)
Anesthesia Evaluation  Patient identified by MRN, date of birth, ID band Patient awake    Reviewed: Allergy & Precautions, NPO status , Patient's Chart, lab work & pertinent test results, reviewed documented beta blocker date and time   History of Anesthesia Complications Negative for: history of anesthetic complications  Airway Mallampati: II  TM Distance: >3 FB Neck ROM: Full    Dental  (+) Teeth Intact, Dental Advisory Given   Pulmonary neg pulmonary ROS,  breath sounds clear to auscultation        Cardiovascular + Cardiac Defibrillator Rhythm:Regular  SVT, h/o VT arrest   Neuro/Psych  Headaches, negative psych ROS   GI/Hepatic Neg liver ROS, Crohn's    Endo/Other    Renal/GU negative Renal ROS     Musculoskeletal   Abdominal   Peds  Hematology negative hematology ROS (+)   Anesthesia Other Findings   Reproductive/Obstetrics                            Anesthesia Physical Anesthesia Plan  ASA: III  Anesthesia Plan: MAC   Post-op Pain Management:    Induction: Intravenous  Airway Management Planned: Simple Face Mask  Additional Equipment: None  Intra-op Plan:   Post-operative Plan: Extubation in OR  Informed Consent: I have reviewed the patients History and Physical, chart, labs and discussed the procedure including the risks, benefits and alternatives for the proposed anesthesia with the patient or authorized representative who has indicated his/her understanding and acceptance.   Dental advisory given  Plan Discussed with: CRNA, Anesthesiologist and Surgeon  Anesthesia Plan Comments:         Anesthesia Quick Evaluation

## 2015-06-08 NOTE — Progress Notes (Signed)
Site area: RT GROIN Site Prior to Removal:  Level O Pressure Applied For: 10 minutes Manual: YES   Patient Status During Pull:  awake: Post Pull Site:  Level 0 Post Pull Instructions Given:  yes Post Pull Pulses Present: yes Dressing Applied:yes   Bedrest begin 12:30:00   Comments:  3 veinous sheaths

## 2015-06-08 NOTE — Transfer of Care (Signed)
Immediate Anesthesia Transfer of Care Note  Patient: Julie Saunders  Procedure(s) Performed: Procedure(s): SVT Ablation (N/A)  Patient Location: PACU and Cath Lab  Anesthesia Type:MAC  Level of Consciousness: awake, alert , oriented and patient cooperative  Airway & Oxygen Therapy: Patient Spontanous Breathing  Post-op Assessment: Report given to RN, Post -op Vital signs reviewed and stable and Patient moving all extremities X 4  Post vital signs: Reviewed and stable  Last Vitals:  Filed Vitals:   06/08/15 0617  BP: 121/64  Pulse: 94  Temp: 36.9 C  Resp: 20    Complications: No apparent anesthesia complications

## 2015-06-08 NOTE — Discharge Instructions (Signed)
Call [facility] at [phone] if any bleeding, swelling or drainage at cath site.  May shower, no tub baths for 48 hours for groin sticks. No lifting over 5 pounds for 7 days.  No Driving for 3 days  No strenuous activity for 1 week.  No sexual activity for 1 week.  Heart Healthy Diet

## 2015-06-08 NOTE — H&P (Signed)
ID: LEZETTE KITTS, DOB [birth year], MRN [id number]  PCP: Donnie Coffin, MD  Primary Electrophysiologist: Dr Caryl Comes  Chief Complaint  Patient presents with  . SVT    History of Present Illness: Julie Saunders is a 29 y.o. female who presents today for EPS and ablation. She has a h/o SVT, probably atrial tachycardia with CL 250 msec with 1:1 AV conduction for which she received ICD therapy. She has been on propranolol since that time. Though she has not had any additional arrhythmias, she does note some fatigue.  Today, she denies symptoms of palpitations, chest pain, shortness of breath, orthopnea, PND, lower extremity edema, claudication, dizziness, presyncope, syncope, bleeding, or neurologic sequela. The patient is tolerating medications without difficulties and is otherwise without complaint today.    Past Medical History  Diagnosis Date  . Crohn disease   . Migraine   . Allergy   . Pancytopenia 11/23/2012  . Cardiac arrest   . Bradycardia   . SVT (supraventricular tachycardia) 12/08/2014    likely an atrial tachycardia with CL 250 msec   Past Surgical History  Procedure Laterality Date  . Implantable cardioverter defibrillator implant  09/12/2014    STJ dual chamber ICD implanted by Dr Caryl Comes for aborted cardiac arrest  . Electrophysiology study N/A 09/11/2014    Procedure: ELECTROPHYSIOLOGY STUDY; Surgeon: Deboraha Sprang, MD; Location: [facility] CATH LAB; Service: Cardiovascular; Laterality: N/A;  . Implantable cardioverter defibrillator implant N/A 09/13/2014    Procedure: IMPLANTABLE CARDIOVERTER DEFIBRILLATOR IMPLANT; Surgeon: Deboraha Sprang, MD; Location: [facility] CATH LAB; Service: Cardiovascular; Laterality: N/A;     Current Outpatient Prescriptions  Medication Sig Dispense Refill  . acetaminophen (TYLENOL) 325 MG tablet Take 650 mg by mouth every 6 (six) hours as needed for  headache.    . ALPRAZolam (XANAX) 0.25 MG tablet Take 0.25 mg by mouth 3 (three) times daily as needed for anxiety.    . cetirizine (ZYRTEC) 10 MG tablet Take 10 mg by mouth daily.    Marland Kitchen dimenhyDRINATE (DRAMAMINE) 50 MG tablet Take 50 mg by mouth daily as needed for dizziness.    . famotidine (PEPCID) 20 MG tablet Take 1 tablet (20 mg total) by mouth 2 (two) times daily. 60 tablet 0  . folic acid (FOLVITE) 638 MCG tablet Take 800 mcg by mouth daily.     Marland Kitchen HYDROcodone-acetaminophen (NORCO/VICODIN) 5-325 MG per tablet Take 1 tablet by mouth every 4 (four) hours as needed for moderate pain. 15 tablet 0  . loperamide (IMODIUM A-D) 2 MG tablet Take 2 mg by mouth daily as needed for diarrhea or loose stools.     . mesalamine (PENTASA) 250 MG CR capsule Take 1,000 mg by mouth 4 (four) times daily.    . Pediatric Multiple Vit-C-FA (ANIMAL CHEWABLE MULTIVITAMIN PO) Take 1 tablet by mouth daily.    . potassium chloride SA (KLOR-CON M20) 20 MEQ tablet TAKE 1 TABLET (20 MEQ TOTAL) BY MOUTH DAILY. 30 tablet 6  . Probiotic Product (ALIGN PO) Take 1 tablet by mouth daily.    . promethazine (PHENERGAN) 25 MG tablet Take 25 mg by mouth every 4 (four) hours as needed for nausea or vomiting.    . propranolol ER (INDERAL LA) 120 MG 24 hr capsule Take 1 capsule (120 mg total) by mouth daily. 30 capsule 6  . SUMAtriptan (IMITREX) 100 MG tablet Take 100 mg by mouth every 2 (two) hours as needed for migraine. May repeat in 2 hours if headache persists or recurs.  No current facility-administered medications for this visit.    Allergies: Sulfa antibiotics and Sulfacetamide sodium   Social History: The patient  reports that she has never smoked. She does not have any smokeless tobacco history on file. She reports that she drinks about 0.5 oz of alcohol per week. She reports that she does not use illicit drugs.   Family History: The  patient's family history includes Asthma in her paternal grandmother; Diabetes in her maternal grandfather and paternal grandfather; Heart attack in her maternal grandmother and mother.    ROS: Please see the history of present illness. All other systems are reviewed and negative.    PHYSICAL EXAM: Filed Vitals:   06/08/15 0617  BP: 121/64  Pulse: 94  Temp: 98.4 F (36.9 C)  Resp: 20   GEN: Well nourished, well developed, in no acute distress  HEENT: normal  Neck: no JVD, carotid bruits, or masses Cardiac: RRR; no murmurs, rubs, or gallops,no edema  Respiratory: clear to auscultation bilaterally, normal work of breathing GI: soft, nontender, nondistended, + BS MS: no deformity or atrophy  Skin: warm and dry, device pocket is well healed Neuro: Strength and sensation are intact Psych: euthymic mood, full affect   Device interrogation is reviewed today in detail. See PaceArt for details.  Labs reviewed  ASSESSMENT AND PLAN:  1. SVT Likely an atrial tachycardia with CL 250 msec and 1:1 AV conduction. Strips are again reviewed today in EPIC from 2/16 device interrogation. Therapeutic strategies for supraventricular tachycardia including medicine and ablation were discussed in detail with the patient today. Risk, benefits, and alternatives to EP study and radiofrequency ablation were also discussed in detail today. These risks include but are not limited to stroke, bleeding, vascular damage, tamponade, perforation, damage to the heart and other structures, AV block, ICD lead dislodgement requiring revision, worsening renal function, and death. The patient understands these risk and wishes to proceed.

## 2015-06-09 DIAGNOSIS — I471 Supraventricular tachycardia: Secondary | ICD-10-CM | POA: Diagnosis not present

## 2015-06-09 DIAGNOSIS — K5090 Crohn's disease, unspecified, without complications: Secondary | ICD-10-CM | POA: Diagnosis not present

## 2015-06-09 NOTE — Discharge Summary (Signed)
Physician Discharge Summary  Patient ID: VERNIA TEEM MRN: [id number] DOB/AGE: 05-Feb-1986 29 y.o.   Primary Electrophysiologist: Dr. Caryl Comes  Admit date: 06/08/2015 Discharge date: 06/09/2015  Admission Diagnoses: SVT  Discharge Diagnoses:  Principal Problem:   SVT (supraventricular tachycardia) Active Problems:   Status post radiofrequency ablation for arrhythmia, atrial tach 06/08/15   Discharged Condition: stable  Hospital Course: Julie Saunders is a 29 y.o. female with a history of SVT admitted 06/08/15 for EP study and radiofrequency ablation. The patient has had several episode of SVT for which she has received ICD shock therapy inappropriately. The tachycardia typically begins with ventricular ectopy and appears to be an atrial tachycardia with CL 340 msec. She has failed medical therapy with beta blockers. The patient therefore elected to undergo catheter ablation. The procedure was performed by Dr. Rayann Heman via the right femoral artery. She was in sinus rhythm upon presentation. She had easily inducible and incessant atrial tachycardia (clinial tachycardia) successfully ablated at the floor of the ostium of the coronary sinus. Dual AV nodal physiology was present however she has not had clinical AVNRT in the past or at time of EP study (even on isuprel). Dr. Rayann Heman therefore elected to not perform slow pathway ablation. There were no inducible arrhythmias following ablation both on and off of Isuprel. She left the EP lab in stable condition.  There were no early apparent complications. She was monitored overnight an remained asymptomatic. Telemetry showed occasional PVCs but no sustain arrhthymias. She denied chest pain or dyspnea. She noted mild right groin tenderness but no severe pain and ambulated without difficulty. Groin was stable. She was last seen and examined by Dr. Rayann Heman who determined she was stable for discharge home. EP f/u arranged on 07/04/15.    Consults:  None  Significant Diagnostic Studies/Treatments:  EP Study + Ablation 06/08/15 See Study/Procedure Report for Complete Details CONCLUSIONS: 1. Sinus rhythm upon presentation.  2. Easily inducible and incessant atrial tachycardia (clinial tachycardia) successfully ablated at the floor of the ostium of the coronary sinus 3. Dual AV nodal physiology was present however she has not had clinical AVNRT in the past or today (even on isuprel). I therefore elected to not perform slow pathway ablation 4. No inducible arrhythmias following ablation both on and off of Isuprel 5. No early apparent complications.    Discharge Exam: Blood pressure 104/57, pulse 75, temperature 98.8 F (37.1 C), temperature source Oral, resp. rate 18, height 5' 5"  (1.651 m), weight 54.885 kg (121 lb), last menstrual period 11/24/2014, SpO2 100 %. General appearance: alert, cooperative and no distress Head: Normocephalic, without obvious abnormality, atraumatic Neck: no carotid bruit and no JVD Resp: clear to auscultation bilaterally Cardio: regular rate and rhythm, S1, S2 normal, no murmur, click, rub or gallop GI: soft, non-tender; bowel sounds normal; no masses,  no organomegaly Extremities: no LEE; right groin is soft but tender. No ecchymosis. Pulses: 2+ and symmetric Skin: warm and dry Neurologic: Grossly normal  Disposition: 01-Home or Self Care     Medication List    TAKE these medications        acetaminophen 325 MG tablet  Commonly known as:  TYLENOL  Take 650 mg by mouth every 6 (six) hours as needed for headache.     ALIGN PO  Take 1 tablet by mouth daily.     ALPRAZolam 0.25 MG tablet  Commonly known as:  XANAX  Take 0.25 mg by mouth 3 (three) times daily as needed for anxiety.  ANIMAL CHEWABLE MULTIVITAMIN PO  Take 1 tablet by mouth daily.     cetirizine 10 MG tablet  Commonly known as:  ZYRTEC  Take 10 mg by mouth daily.     dimenhyDRINATE 50 MG tablet  Commonly known as:   DRAMAMINE  Take 50 mg by mouth daily as needed for dizziness.     famotidine 20 MG tablet  Commonly known as:  PEPCID  Take 1 tablet (20 mg total) by mouth 2 (two) times daily.     folic acid 340 MCG tablet  Commonly known as:  FOLVITE  Take 800 mcg by mouth daily.     HYDROcodone-acetaminophen 5-325 MG per tablet  Commonly known as:  NORCO/VICODIN  Take 1 tablet by mouth every 4 (four) hours as needed for moderate pain.     loperamide 2 MG tablet  Commonly known as:  IMODIUM A-D  Take 2 mg by mouth daily as needed for diarrhea or loose stools.     mesalamine 250 MG CR capsule  Commonly known as:  PENTASA  Take 1,000 mg by mouth 4 (four) times daily.     potassium chloride SA 20 MEQ tablet  Commonly known as:  KLOR-CON M20  TAKE 1 TABLET (20 MEQ TOTAL) BY MOUTH DAILY.     promethazine 25 MG tablet  Commonly known as:  PHENERGAN  Take 25 mg by mouth every 4 (four) hours as needed for nausea or vomiting.     propranolol ER 60 MG 24 hr capsule  Commonly known as:  INDERAL LA  Take 3 capsules (180 mg total) by mouth daily.     SUMAtriptan 100 MG tablet  Commonly known as:  IMITREX  Take 100 mg by mouth every 2 (two) hours as needed for migraine. May repeat in 2 hours if headache persists or recurs.        Discharge Instructions: Call [facility] at [phone] if any bleeding, swelling or drainage at cath site. May shower, no tub baths for 48 hours for groin sticks. No lifting over 5 pounds for 7 days. No Driving for 3 days  No strenuous activity for 1 week. No sexual activity for 1 week.  Heart Healthy Diet   Follow-up Information    Follow up with Thompson Grayer, MD On 07/04/2015.   Specialty:  Cardiology   Why:  1:45 pm   Contact information:   [address] [place] [address] [phone]       TIME SPENT ON DISCHARGE, INCLUDING PHYSICIAN TIME:> 30 MINUTES  Signed: Thompson Grayer MD 06/09/2015, 9:52 AM

## 2015-06-11 ENCOUNTER — Telehealth: Payer: Self-pay | Admitting: Internal Medicine

## 2015-06-11 MED FILL — Bupivacaine HCl Preservative Free (PF) Inj 0.25%: INTRAMUSCULAR | Qty: 30 | Status: AC

## 2015-06-11 NOTE — Telephone Encounter (Signed)
New message     Pt father states pt started having some numbness in right hand,arm, face and tongue Pt had ablation last week Please call to discuss

## 2015-06-11 NOTE — Telephone Encounter (Signed)
Spoke with the father and he states her right hand and arm went numb.  Also went up into her face and now she has a migraine.  Discussed with Dr Rayann Heman and he suggest she take her mediations for migraine and follow up with PCP. If worsens may go to the ER.  Father understood and agrees with plan

## 2015-06-26 ENCOUNTER — Ambulatory Visit: Payer: 59 | Admitting: *Deleted

## 2015-06-26 ENCOUNTER — Telehealth: Payer: Self-pay | Admitting: Cardiology

## 2015-06-26 DIAGNOSIS — I429 Cardiomyopathy, unspecified: Secondary | ICD-10-CM

## 2015-06-26 DIAGNOSIS — I5022 Chronic systolic (congestive) heart failure: Secondary | ICD-10-CM

## 2015-06-26 NOTE — Progress Notes (Signed)
Remote ICD transmission.   

## 2015-06-26 NOTE — Telephone Encounter (Signed)
LMOVM reminding pt to send remote transmission.   

## 2015-07-04 ENCOUNTER — Encounter: Payer: Self-pay | Admitting: Internal Medicine

## 2015-07-04 ENCOUNTER — Ambulatory Visit (INDEPENDENT_AMBULATORY_CARE_PROVIDER_SITE_OTHER): Payer: 59 | Admitting: Internal Medicine

## 2015-07-04 VITALS — BP 90/64 | HR 61 | Ht 65.0 in | Wt 112.8 lb

## 2015-07-04 DIAGNOSIS — I471 Supraventricular tachycardia, unspecified: Secondary | ICD-10-CM

## 2015-07-04 DIAGNOSIS — I429 Cardiomyopathy, unspecified: Secondary | ICD-10-CM | POA: Diagnosis not present

## 2015-07-04 DIAGNOSIS — I469 Cardiac arrest, cause unspecified: Secondary | ICD-10-CM | POA: Diagnosis not present

## 2015-07-04 DIAGNOSIS — I5022 Chronic systolic (congestive) heart failure: Secondary | ICD-10-CM | POA: Diagnosis not present

## 2015-07-04 LAB — CUP PACEART INCLINIC DEVICE CHECK
Battery Remaining Longevity: 92.4 mo
Brady Statistic RA Percent Paced: 18 %
Brady Statistic RV Percent Paced: 0.64 %
Date Time Interrogation Session: 20160824150652
HighPow Impedance: 58.5 Ohm
Lead Channel Impedance Value: 337.5 Ohm
Lead Channel Impedance Value: 550 Ohm
Lead Channel Pacing Threshold Amplitude: 0.5 V
Lead Channel Pacing Threshold Pulse Width: 0.4 ms
Lead Channel Pacing Threshold Pulse Width: 0.4 ms
Lead Channel Pacing Threshold Pulse Width: 0.4 ms
Lead Channel Sensing Intrinsic Amplitude: 11.8 mV
Lead Channel Setting Pacing Amplitude: 2.5 V
Lead Channel Setting Pacing Pulse Width: 0.4 ms
MDC IDC MSMT LEADCHNL RA PACING THRESHOLD AMPLITUDE: 0.5 V
MDC IDC MSMT LEADCHNL RA SENSING INTR AMPL: 5 mV
MDC IDC MSMT LEADCHNL RV PACING THRESHOLD AMPLITUDE: 0.75 V
MDC IDC MSMT LEADCHNL RV PACING THRESHOLD AMPLITUDE: 0.75 V
MDC IDC MSMT LEADCHNL RV PACING THRESHOLD PULSEWIDTH: 0.4 ms
MDC IDC SET LEADCHNL RA PACING AMPLITUDE: 2 V
MDC IDC SET LEADCHNL RV SENSING SENSITIVITY: 0.5 mV
MDC IDC SET ZONE DETECTION INTERVAL: 240 ms
Pulse Gen Serial Number: 7179320

## 2015-07-04 NOTE — Patient Instructions (Signed)
Medication Instructions:  Your physician recommends that you continue on your current medications as directed. Please refer to the Current Medication list given to you today.   Labwork: None ordered  Testing/Procedures: None ordered  Follow-Up: Your physician recommends that you schedule a follow-up appointment in: 3 months with Dr Caryl Comes   Any Other Special Instructions Will Be Listed Below (If Applicable).

## 2015-07-04 NOTE — Progress Notes (Signed)
PCP: Donnie Coffin, MD Primary EP:  Dr Victorino Sparrow Julie Saunders is a 29 y.o. female who presents today for routine electrophysiology followup.  Since her recent SVT ablation the patient reports doing very well.   Denies procedure related complications.  No further SVT.  Occasional hypotension (above baseline) with dizziness that is due to propranolol Today, she denies symptoms of palpitations, chest pain, shortness of breath,  lower extremity edema,  presyncope, syncope, or ICD shocks.  The patient is otherwise without complaint today.   Past Medical History  Diagnosis Date  . Crohn disease   . Allergy   . Pancytopenia 11/23/2012  . Cardiac arrest 08/31/2014  . Bradycardia   . SVT (supraventricular tachycardia) 12/08/2014    likely an atrial tachycardia with CL 250 msec  . Status post radiofrequency ablation for arrhythmia, atrial tach 06/08/15 06/08/2015  . 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 History of blood transfusion 09/2012    "had allergic rxn to one of my Crohn's RX"   . Migraine     "a few times/yr" (06/08/2015)  . Depression    Past Surgical History  Procedure Laterality Date  . Electrophysiology study N/A 09/11/2014    Procedure: ELECTROPHYSIOLOGY STUDY;  Surgeon: Deboraha Sprang, MD;  Location: [facility] CATH LAB;  Service: Cardiovascular;  Laterality: N/A;  . Implantable cardioverter defibrillator implant N/A 09/13/2014    Procedure: IMPLANTABLE CARDIOVERTER DEFIBRILLATOR IMPLANT;  Surgeon: Deboraha Sprang, MD;  Location: [facility] C[facility] LAB;  Service: Cardiovascular;  Laterality: N/A;; STJ dual chamber ICD implanted by Dr Caryl Comes for aborted cardiac arrest  . Electrophysiologic study N/A 06/08/2015    Procedure: SVT Ablation;  Surgeon: Thompson Grayer, MD;  Location: [place] CV LAB;  Service: Cardiovascular;  Laterality: N/A;    ROS- all systems are reviewed and negative except as per HPI  above  Current Outpatient Prescriptions  Medication Sig Dispense Refill  . acetaminophen (TYLENOL) 325 MG tablet Take 650 mg by mouth every 6 (six) hours as needed for headache.    . ALPRAZolam (XANAX) 0.25 MG tablet Take 0.25 mg by mouth 3 (three) times daily as needed for anxiety.    . cetirizine (ZYRTEC) 10 MG tablet Take 10 mg by mouth daily.    . citalopram (CELEXA) 10 MG tablet Take 1 tablet by mouth daily.  12  . dimenhyDRINATE (DRAMAMINE) 50 MG tablet Take 50 mg by mouth daily as needed for dizziness.    . famotidine (PEPCID) 20 MG tablet Take 1 tablet (20 mg total) by mouth 2 (two) times daily. 60 tablet 0  . folic acid (FOLVITE) 458 MCG tablet Take 800 mcg by mouth daily.     Marland Kitchen HYDROcodone-acetaminophen (NORCO/VICODIN) 5-325 MG per tablet Take 1 tablet by mouth every 4 (four) hours as needed for moderate pain. 15 tablet 0  . loperamide (IMODIUM A-D) 2 MG tablet Take 2 mg by mouth daily as needed for diarrhea or loose stools.     . megestrol (MEGACE) 40 MG/ML suspension Take 1-2 teaspoons by mouth once daily for thirty days  2  . mesalamine (PENTASA) 250 MG CR capsule Take 1,000 mg by mouth 4 (four) times daily.    . Pediatric Multiple Vit-C-FA (ANIMAL CHEWABLE MULTIVITAMIN PO) Take 1 tablet by mouth daily.    . potassium chloride SA (KLOR-CON M20) 20 MEQ tablet TAKE 1 TABLET (20 MEQ TOTAL) BY MOUTH DAILY. 30 tablet 6  .  Probiotic Product (ALIGN PO) Take 1 tablet by mouth daily.    . promethazine (PHENERGAN) 25 MG tablet Take 25 mg by mouth every 4 (four) hours as needed for nausea or vomiting.    . propranolol ER (INDERAL LA) 60 MG 24 hr capsule Take 3 capsules (180 mg total) by mouth daily. 270 capsule 3  . SUMAtriptan (IMITREX) 100 MG tablet Take 100 mg by mouth every 2 (two) hours as needed for migraine. May repeat in 2 hours if headache persists or recurs.     No current facility-administered medications for this visit.    Physical Exam: Filed Vitals:   07/04/15 1345  BP:  90/64  Pulse: 61  Height: 5' 5"  (1.651 m)  Weight: 51.166 kg (112 lb 12.8 oz)    GEN- The patient is well appearing, alert and oriented x 3 today.   Head- normocephalic, atraumatic Eyes-  Sclera clear, conjunctiva pink Ears- hearing intact Oropharynx- clear Lungs- Clear to ausculation bilaterally, normal work of breathing Chest- ICD pocket is well healed Heart- Regular rate and rhythm, no murmurs, rubs or gallops, PMI not laterally displaced GI- soft, NT, ND, + BS Extremities- no clubbing, cyanosis, or edema  ICD interrogation- reviewed in detail today,  See PACEART report ekg today reveals sinus rhythm with diffuse t wave changes  Assessment and Plan:  1.  SVT No further episodes post ablation No changes today, though would plan to reduce beta blocker (given chronic hypotension) upon follow-up with Dr Caryl Comes if no further SVT.  I will see as needed going forward Compliance with remote monitoring is encouraged Follow-up with Dr Caryl Comes in 3 months

## 2015-08-02 ENCOUNTER — Encounter: Payer: Self-pay | Admitting: Internal Medicine

## 2015-08-16 IMAGING — CR DG CHEST 1V PORT
1 series · 1 of 1 positions shown · non-contrast
Comparison: 09/03/2014.

CLINICAL DATA: Cardiac arrest.  Assess support apparatus.

EXAM:
PORTABLE CHEST - 1 VIEW

[AP]
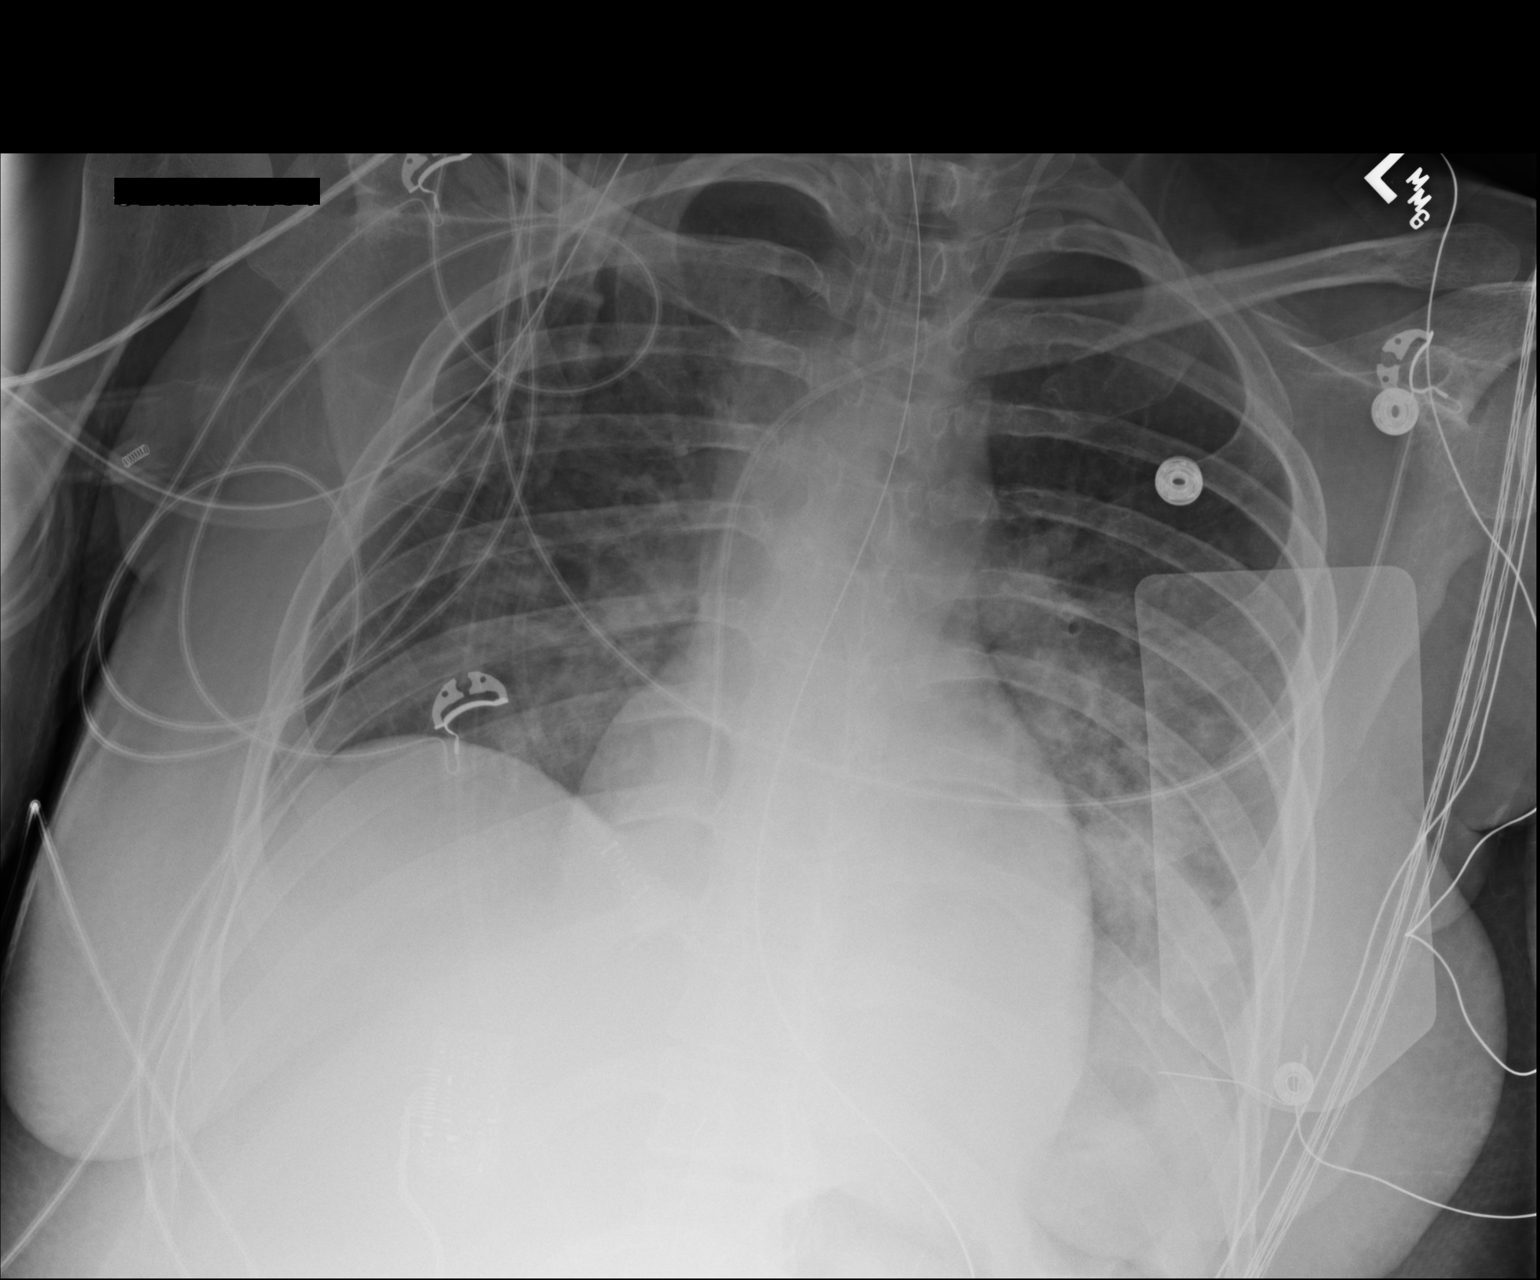

[1 of 1 positions shown; findings below may reference images not displayed]

FINDINGS: Support apparatus: Unchanged.

Cardiomediastinal Silhouette:  Unchanged.

Lungs: Slight improvement in bilateral airspace disease compatible
with pulmonary edema. Some of the airspace disease has shifted. More
prominent basilar atelectasis. No pneumothorax.

Effusions:  None.

Other: LEFT costophrenic angle excluded from view. Defibrillator
pads remain present.
IMPRESSION: 1. Unchanged support apparatus.
2. Shifting airspace disease compatible with pulmonary edema.

## 2015-08-17 IMAGING — CR DG CHEST 1V PORT
1 series · 1 of 1 positions shown · non-contrast
Comparison: September 04, 2014

CLINICAL DATA: Hypoxia

EXAM:
PORTABLE CHEST - 1 VIEW

[AP]
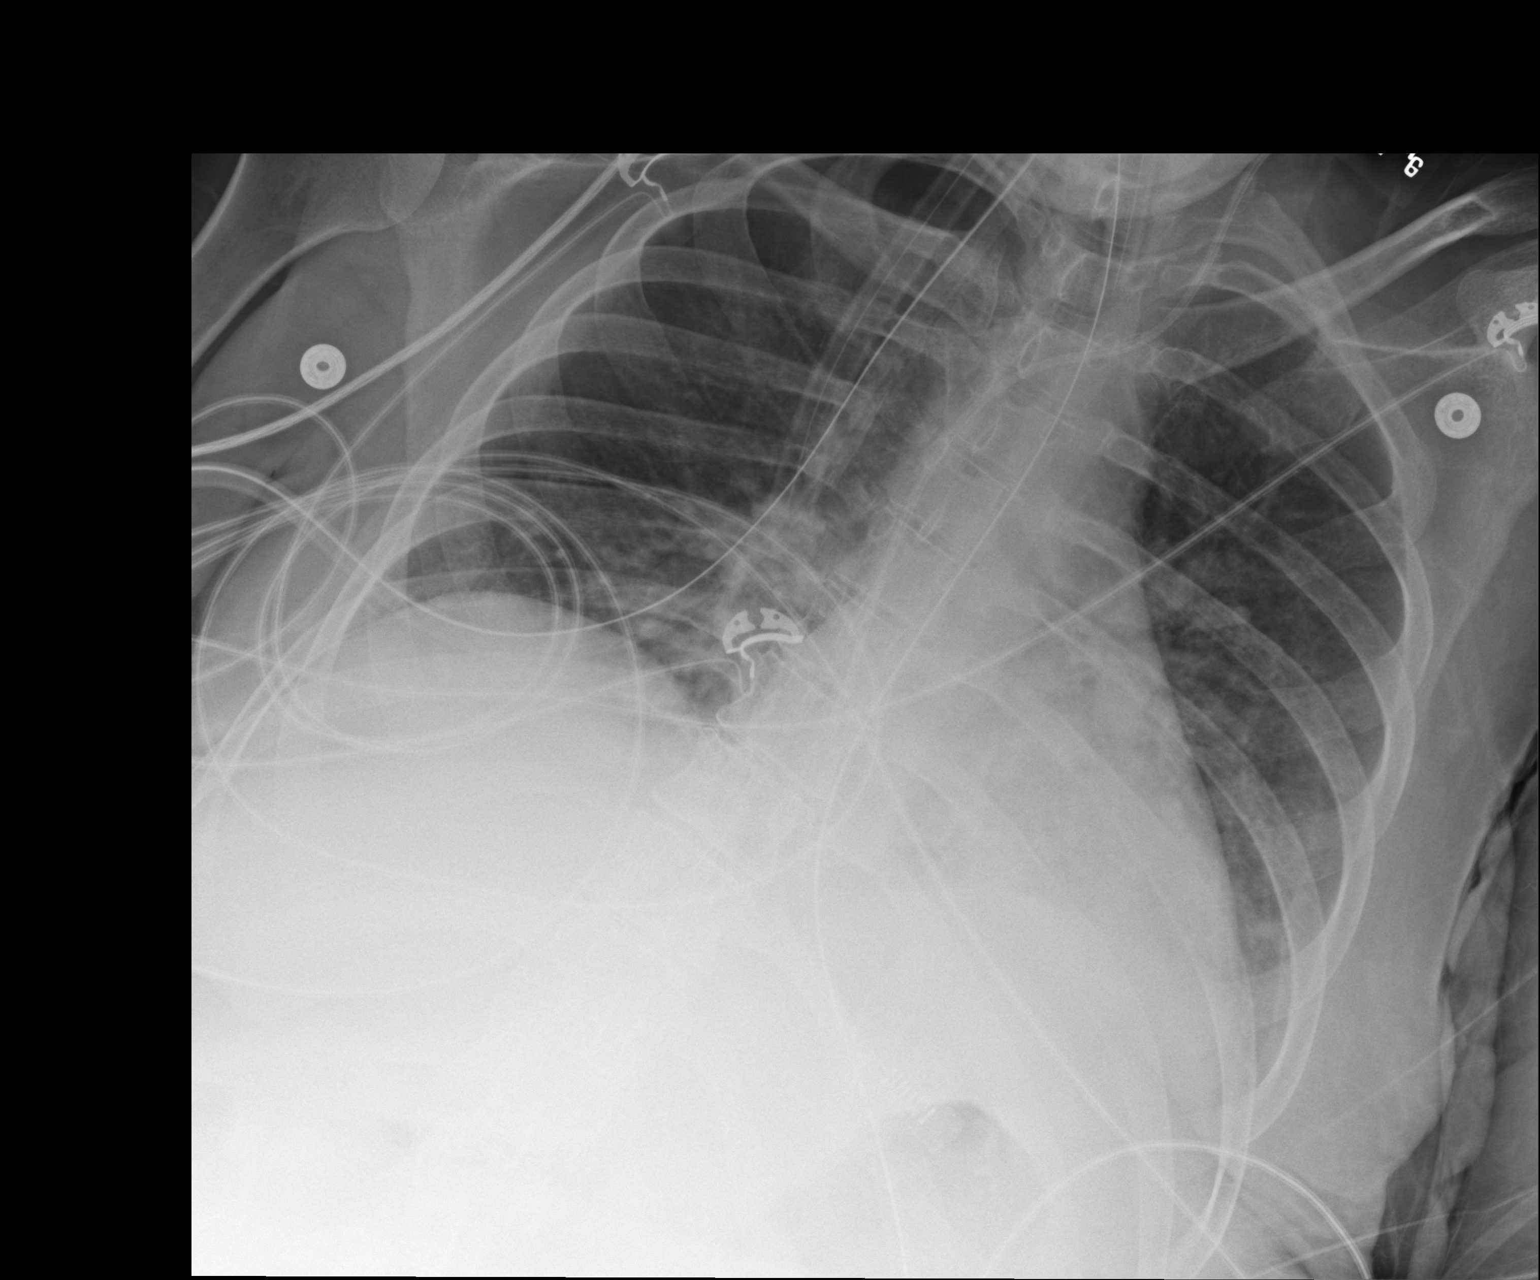

[1 of 1 positions shown; findings below may reference images not displayed]

FINDINGS: Endotracheal tube tip is 6.5 cm above the carina. Central catheter
tip is at the cavoatrial junction level. Nasogastric tube tip and
side port are below the diaphragm. No pneumothorax. There is
persistent left lower lobe consolidation. Right lung is clear. Heart
is upper normal in size with pulmonary vascularity within normal
limits. No adenopathy. No bone lesions.
IMPRESSION: Tube and catheter positions as described without pneumothorax.
Persistent left lower lobe consolidation. No new opacity. No change
in cardiac silhouette.

## 2015-08-22 ENCOUNTER — Encounter: Payer: Self-pay | Admitting: Internal Medicine

## 2015-08-23 ENCOUNTER — Telehealth: Payer: Self-pay | Admitting: Cardiology

## 2015-08-23 NOTE — Telephone Encounter (Signed)
Informed pt that her name was not on the [employer] recall list. Pt verbalized understanding.

## 2015-09-14 DIAGNOSIS — Z95810 Presence of automatic (implantable) cardiac defibrillator: Secondary | ICD-10-CM

## 2015-09-14 HISTORY — DX: Presence of automatic (implantable) cardiac defibrillator: Z95.810

## 2015-10-01 ENCOUNTER — Telehealth: Payer: Self-pay | Admitting: Internal Medicine

## 2015-10-01 MED ORDER — POTASSIUM CHLORIDE CRYS ER 20 MEQ PO TBCR: EXTENDED_RELEASE_TABLET | ORAL | Status: DC

## 2015-10-01 NOTE — Telephone Encounter (Signed)
I spoke with the patient. I advised her to continue potassium until seen by Dr. Caryl Comes on 10/26/15 and we can re-look at her BMP. Last reading was 06/01/15- 3.4. She needs a refill sent in to [facility].

## 2015-10-01 NOTE — Telephone Encounter (Signed)
New problem   Pt need to know if she need to continue taking her Potassium medication. Pt wish to speak to nurse.

## 2015-10-18 ENCOUNTER — Encounter: Payer: 59 | Admitting: Internal Medicine

## 2015-10-26 ENCOUNTER — Ambulatory Visit (INDEPENDENT_AMBULATORY_CARE_PROVIDER_SITE_OTHER): Payer: 59 | Admitting: Internal Medicine

## 2015-10-26 ENCOUNTER — Encounter: Payer: Self-pay | Admitting: Internal Medicine

## 2015-10-26 VITALS — BP 104/67 | HR 54 | Ht 65.0 in | Wt 141.8 lb

## 2015-10-26 DIAGNOSIS — I469 Cardiac arrest, cause unspecified: Secondary | ICD-10-CM

## 2015-10-26 DIAGNOSIS — I429 Cardiomyopathy, unspecified: Secondary | ICD-10-CM

## 2015-10-26 DIAGNOSIS — I472 Ventricular tachycardia, unspecified: Secondary | ICD-10-CM

## 2015-10-26 DIAGNOSIS — I4901 Ventricular fibrillation: Secondary | ICD-10-CM

## 2015-10-26 DIAGNOSIS — I5022 Chronic systolic (congestive) heart failure: Secondary | ICD-10-CM | POA: Diagnosis not present

## 2015-10-26 DIAGNOSIS — Z4502 Encounter for adjustment and management of automatic implantable cardiac defibrillator: Secondary | ICD-10-CM | POA: Diagnosis not present

## 2015-10-26 NOTE — Progress Notes (Signed)
Patient Care Team: L.Donnie Coffin, MD as PCP - General (Family Medicine)   HPI  Julie Saunders is a 29 y.o. female Seen in follow-up for aborted cardiac arrest.  No specific cause was identified. She was treated with beta blockers initially but these were stopped because of hypotension and dizziness. During cardiac rehabilitation she had an episode with ICD discharges. Dr. Greggory Brandy looked at these. I agree with his interpretation. 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 ICD discharge initially failed to terminate the SVT.  She has subsequently undergone EPS RFCA  And these records were reveiewed>>ectopic tachycardia successful  She has not had any intercurrent atrial arrhythmia.   The patient denies chest pain, shortness of breath, nocturnal dyspnea, orthopnea or peripheral edema.  There have been no palpitations or syncope. She has had some orthostatic lightheadedness.  Last potassium level was4.2 checked by her GI doctor last month.   She remains on supplementation as opposed to Aldactone  Past Medical History  Diagnosis Date  . Crohn disease   . Allergy   . Pancytopenia 11/23/2012  . Cardiac arrest ([place]) 08/31/2014  . Bradycardia   . SVT (supraventricular tachycardia) 12/08/2014    likely an atrial tachycardia with CL 250 msec  . Status post radiofrequency ablation for arrhythmia, atrial tach 06/08/15 06/08/2015  . 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 History of blood transfusion 09/2012    "had allergic rxn to one of my Crohn's RX"   . Migraine     "a few times/yr" (06/08/2015)  . Depression     Past Surgical History  Procedure Laterality Date  . Electrophysiology study N/A 09/11/2014    Procedure: ELECTROPHYSIOLOGY STUDY;  Surgeon: Deboraha Sprang, MD;  [facility] CATH LAB;  Service: Cardiovascular;  Laterality: N/A;  .  Implantable cardioverter defibrillator implant N/A 09/13/2014    Procedure: IMPLANTABLE CARDIOVERTER DEFIBRILLATOR IMPLANT;  Surgeon: Deboraha Sprang, MD;  Location: [facility] CATH LAB;  Service: Cardiovascular;  Laterality: N/A;; STJ dual chamber ICD implanted by Dr Caryl Comes for aborted cardiac arrest  . Electrophysiologic study N/A 06/08/2015    Procedure: SVT Ablation;  Surgeon: Thompson Grayer, MD;  Location: [place] CV LAB;  Service: Cardiovascular;  Laterality: N/A;    Current Outpatient Prescriptions  Medication Sig Dispense Refill  . acetaminophen (TYLENOL) 325 MG tablet Take 650 mg by mouth every 6 (six) hours as needed for headache.    . ALPRAZolam (XANAX) 0.25 MG tablet Take 0.25 mg by mouth 3 (three) times daily as needed for anxiety.    . cetirizine (ZYRTEC) 10 MG tablet Take 10 mg by mouth daily.    . citalopram (CELEXA) 10 MG tablet Take 1 tablet by mouth daily.  12  . dimenhyDRINATE (DRAMAMINE) 50 MG tablet Take 50 mg by mouth daily as needed for dizziness.    . famotidine (PEPCID) 20 MG tablet Take 1 tablet (20 mg total) by mouth 2 (two) times daily. 60 tablet 0  . folic acid (FOLVITE) 174 MCG tablet Take 800 mcg by mouth daily.     Marland Kitchen HYDROcodone-acetaminophen (NORCO/VICODIN) 5-325 MG per tablet Take 1 tablet by mouth every 4 (four) hours as needed for moderate pain. 15 tablet 0  . loperamide (IMODIUM A-D) 2 MG tablet Take 2 mg by mouth daily as needed for diarrhea or loose stools.     . megestrol (MEGACE) 40 MG/ML  suspension Take 1-2 teaspoons by mouth once daily for thirty days  2  . mesalamine (PENTASA) 250 MG CR capsule Take 1,000 mg by mouth 4 (four) times daily.    . Pediatric Multiple Vit-C-FA (ANIMAL CHEWABLE MULTIVITAMIN PO) Take 1 tablet by mouth daily.    . potassium chloride SA (KLOR-CON M20) 20 MEQ tablet TAKE 1 TABLET (20 MEQ TOTAL) BY MOUTH DAILY. 30 tablet 6  . Probiotic Product (ALIGN PO) Take 1 tablet by mouth daily.    . promethazine (PHENERGAN) 25 MG tablet Take 25 mg  by mouth every 4 (four) hours as needed for nausea or vomiting.    . propranolol ER (INDERAL LA) 60 MG 24 hr capsule Take 3 capsules (180 mg total) by mouth daily. 270 capsule 3  . SUMAtriptan (IMITREX) 100 MG tablet Take 100 mg by mouth every 2 (two) hours as needed for migraine. May repeat in 2 hours if headache persists or recurs.     No current facility-administered medications for this visit.    Allergies  Allergen Reactions  . Sulfa Antibiotics Rash  . Sulfacetamide Sodium Rash    Review of Systems negative except from HPI and PMH  Physical Exam BP 104/67 mmHg  Pulse 54  Ht 5' 5"  (1.651 m)  Wt 141 lb 12.8 oz (64.32 kg)  BMI 23.60 kg/m2  Well developed and well nourished in no acute distress HENT normal E scleral and icterus clear Neck Supple JVP flat; carotids brisk and full Clear to ausculation Device pocket well healed; without hematoma or erythema.  There is no tethering   Regular rate and rhythm, no murmurs gallops or rub Soft with active bowel sounds No clubbing cyanosis none Edema Alert and oriented, grossly normal motor and sensory function Skin Warm and Dry muehrcke lines  ECG 8/16 demonstrates sinus rhythm at 61 Intervals 10/18/37    Assessment and  Plan Aborted cardiac arrest  ICD St Jude   Supraventricular tachycardia  status post RCA-JA  Orthostatic lightheadedness  muehrcke Lines  No intercurrent Ventricular tachycardia  She's recovered nicely. We will continue her propranolol 180 and she will decrease it to 120 at the lightheadedness continues      I reiterated the importance of salt and water  We reviewed the physiology of the Muehrcke lines, apparently associated with treated systemic illness not infrequently with hypoalbuminemia and I suspect in this case related to the flares of disease

## 2015-10-26 NOTE — Patient Instructions (Signed)
Medication Instructions:  Your physician recommends that you continue on your current medications as directed. Please refer to the Current Medication list given to you today.  Labwork: None ordered  Testing/Procedures: None ordered  Follow-Up: Remote monitoring is used to monitor your Pacemaker of ICD from home. This monitoring reduces the number of office visits required to check your device to one time per year. It allows [place] to keep an eye on the functioning of your device to ensure it is working properly. You are scheduled for a device check from home on 01/28/2016. You may send your transmission at any time that day. If you have a wireless device, the transmission will be sent automatically. After your physician reviews your transmission, you will receive a postcard with your next transmission date.  Your physician wants you to follow-up in: 6 months with Chanetta Marshall, NP.  You will receive a reminder letter in the mail two months in advance. If you don't receive a letter, please call our office to schedule the follow-up appointment.   If you need a refill on your cardiac medications before your next appointment, please call your pharmacy.  Thank you for choosing [facility]!!

## 2015-10-30 LAB — CUP PACEART INCLINIC DEVICE CHECK
Battery Remaining Longevity: 90
Date Time Interrogation Session: 20161216141316
HIGH POWER IMPEDANCE MEASURED VALUE: 48.375
Implantable Lead Implant Date: 20151104
Implantable Lead Location: 753860
Implantable Lead Model: 5076
Lead Channel Impedance Value: 312.5 Ohm
Lead Channel Pacing Threshold Amplitude: 0.75 V
Lead Channel Pacing Threshold Amplitude: 1 V
Lead Channel Pacing Threshold Pulse Width: 0.4 ms
Lead Channel Pacing Threshold Pulse Width: 0.4 ms
Lead Channel Sensing Intrinsic Amplitude: 9 mV
Lead Channel Setting Pacing Amplitude: 2 V
Lead Channel Setting Pacing Amplitude: 2.5 V
Lead Channel Setting Sensing Sensitivity: 0.5 mV
MDC IDC LEAD IMPLANT DT: 20151104
MDC IDC LEAD LOCATION: 753859
MDC IDC MSMT LEADCHNL RA IMPEDANCE VALUE: 550 Ohm
MDC IDC MSMT LEADCHNL RA SENSING INTR AMPL: 4.8 mV
MDC IDC SET LEADCHNL RV PACING PULSEWIDTH: 0.4 ms
MDC IDC STAT BRADY RA PERCENT PACED: 19 %
MDC IDC STAT BRADY RV PERCENT PACED: 0.4 %
Pulse Gen Serial Number: 7179320

## 2016-01-28 ENCOUNTER — Ambulatory Visit (INDEPENDENT_AMBULATORY_CARE_PROVIDER_SITE_OTHER): Payer: 59 | Admitting: *Deleted

## 2016-01-28 DIAGNOSIS — I469 Cardiac arrest, cause unspecified: Secondary | ICD-10-CM | POA: Diagnosis not present

## 2016-01-29 NOTE — Progress Notes (Signed)
Remote ICD transmission.   

## 2016-03-04 LAB — CUP PACEART REMOTE DEVICE CHECK
Brady Statistic RA Percent Paced: 14 %
HIGH POWER IMPEDANCE MEASURED VALUE: 55 Ohm
Implantable Lead Implant Date: 20151104
Implantable Lead Location: 753859
Implantable Lead Model: 5076
Lead Channel Impedance Value: 310 Ohm
Lead Channel Impedance Value: 480 Ohm
Lead Channel Sensing Intrinsic Amplitude: 4.2 mV
Lead Channel Sensing Intrinsic Amplitude: 7.7 mV
Lead Channel Setting Pacing Amplitude: 2.5 V
MDC IDC LEAD IMPLANT DT: 20151104
MDC IDC LEAD LOCATION: 753860
MDC IDC PG SERIAL: 7179320
MDC IDC SESS DTM: 20170425113433
MDC IDC SET LEADCHNL RA PACING AMPLITUDE: 2 V
MDC IDC SET LEADCHNL RV PACING PULSEWIDTH: 0.4 ms
MDC IDC SET LEADCHNL RV SENSING SENSITIVITY: 0.5 mV
MDC IDC STAT BRADY RV PERCENT PACED: 1 % — AB

## 2016-03-07 ENCOUNTER — Encounter: Payer: Self-pay | Admitting: Cardiology

## 2016-05-20 NOTE — Progress Notes (Signed)
Electrophysiology Office Note Date: 05/21/2016  ID:  Julie Saunders, DOB [birth year], MRN [id number]  PCP: Julie Coffin, MD Electrophysiologist: Julie Saunders  CC: Routine ICD follow-up  Julie Saunders is a 30 y.o. female seen today for Dr Julie Saunders.  She presents today for routine electrophysiology followup.  Since last being seen in our clinic, the patient reports doing reasonably well.  She has had increased orthostatic intolerance with the heat of summer. She has also had increased shortness of breath with exertion and some LE edema. She denies chest pain, palpitations,  PND, orthopnea, nausea, vomiting,weight gain, or early satiety.  She has not had ICD shocks.   Device History: STJ ICD implanted 2016 for cardiac arrest  History of appropriate therapy: no - inappropriate therapy for SVT History of AAD therapy: no   Past Medical History  Diagnosis Date  . Crohn disease ([place])   . Allergy   . Pancytopenia ([place]) 11/23/2012  . Cardiac arrest ([place]) 08/31/2014  . Bradycardia   . SVT (supraventricular tachycardia) ([place]) 12/08/2014    likely an atrial tachycardia with CL 250 msec  . Status post radiofrequency ablation for arrhythmia, atrial tach 06/08/15 06/08/2015  . 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 History of blood transfusion 09/2012    "had allergic rxn to one of my Crohn's RX"   . Migraine     "a few times/yr" (06/08/2015)  . Depression    Past Surgical History  Procedure Laterality Date  . Electrophysiology study N/A 09/11/2014    Procedure: ELECTROPHYSIOLOGY STUDY;  Surgeon: Julie Sprang, MD;  Location: [facility] CATH LAB;  Service: Cardiovascular;  Laterality: N/A;  . Implantable cardioverter defibrillator implant N/A 09/13/2014    Procedure: IMPLANTABLE CARDIOVERTER DEFIBRILLATOR IMPLANT;  Surgeon: Julie Sprang, MD;  Location: [facility] CATH LAB;  Service: Cardiovascular;  Laterality: N/A;;  STJ dual chamber ICD implanted by Dr Julie Saunders for aborted cardiac arrest  . Electrophysiologic study N/A 06/08/2015    Procedure: SVT Ablation;  Surgeon: Julie Grayer, MD;  Location: [place] CV LAB;  Service: Cardiovascular;  Laterality: N/A;    Current Outpatient Prescriptions  Medication Sig Dispense Refill  . acetaminophen (TYLENOL) 325 MG tablet Take 650 mg by mouth every 6 (six) hours as needed for headache.    . ALPRAZolam (XANAX) 0.25 MG tablet Take 0.25 mg by mouth 3 (three) times daily as needed for anxiety.    . cetirizine (ZYRTEC) 10 MG tablet Take 10 mg by mouth daily.    Marland Kitchen dicyclomine (BENTYL) 10 MG capsule Take 1 capsule by mouth as directed.    . dimenhyDRINATE (DRAMAMINE) 50 MG tablet Take 50 mg by mouth daily as needed for dizziness.    . folic acid (FOLVITE) Q000111Q MCG tablet Take 800 mcg by mouth daily.     Marland Kitchen HUMIRA PEN 40 MG/0.8ML PNKT Inject 40 mg as directed. EVERY 14 DAYS    . loperamide (IMODIUM A-D) 2 MG tablet Take 2 mg by mouth daily as needed for diarrhea or loose stools.     . mesalamine (PENTASA) 250 MG CR capsule Take 1,000 mg by mouth 4 (four) times daily.    . Pediatric Multiple Vit-C-FA (ANIMAL CHEWABLE MULTIVITAMIN PO) Take 1 tablet by mouth daily.    . potassium chloride SA (KLOR-CON M20) 20 MEQ tablet TAKE 1 TABLET (20 MEQ TOTAL) BY MOUTH DAILY. 30 tablet 6  . Probiotic Product (ALIGN PO) Take  1 tablet by mouth daily.    . propranolol ER (INDERAL LA) 60 MG 24 hr capsule Take 3 capsules (180 mg total) by mouth daily. 270 capsule 3  . SUMAtriptan (IMITREX) 100 MG tablet Take 100 mg by mouth every 2 (two) hours as needed for migraine. May repeat in 2 hours if headache persists or recurs.     No current facility-administered medications for this visit.    Allergies:   Sulfa antibiotics and Sulfacetamide sodium   Social History: Social History   Social History  . Marital Status: Single    Spouse Name: N/A  . Number of Children: N/A  . Years of Education:  N/A   Occupational History  . Not on file.   Social History Main Topics  . Smoking status: Never Smoker   . Smokeless tobacco: Never Used  . Alcohol Use: 0.5 oz/week    1 Standard drinks or equivalent per week     Comment: 06/08/2015 "might have a couple drinks once/month; wine or mixed"  . Drug Use: No  . Sexual Activity: No   Other Topics Concern  . Not on file   Social History Narrative    Family History: Family History  Problem Relation Age of Onset  . Heart attack Mother   . Heart attack Maternal Grandmother   . Diabetes Maternal Grandfather   . Asthma Paternal Grandmother   . Diabetes Paternal Grandfather     Review of Systems: All other systems reviewed and are otherwise negative except as noted above.   Physical Exam: VS:  BP 104/64 mmHg  Pulse 61  Ht 5\' 5"  (1.651 m)  Wt 139 lb 12.8 oz (63.413 kg)  BMI 23.26 kg/m2 , BMI Body mass index is 23.26 kg/(m^2).  GEN- The patient is well appearing, alert and oriented x 3 today.   HEENT: normocephalic, atraumatic; sclera clear, conjunctiva pink; hearing intact; oropharynx clear; neck supple Lungs- Clear to ausculation bilaterally, normal work of breathing.  No wheezes, rales, rhonchi Heart- Regular rate and rhythm, no murmurs, rubs or gallops GI- soft, non-tender, non-distended, bowel sounds present Extremities- no clubbing, cyanosis, or edema; DP/PT/radial pulses 2+ bilaterally MS- no significant deformity or atrophy Skin- warm and dry, no rash or lesion; ICD pocket well healed Psych- euthymic mood, full affect Neuro- strength and sensation are intact  ICD interrogation- reviewed in detail today,  See PACEART report  EKG:  EKG is not ordered today.  Recent Labs: 06/01/2015: BUN 10; Creatinine, Ser 0.97; Hemoglobin 12.2; Platelets 366.0; Sodium 136 06/08/2015: Potassium 3.6   Wt Readings from Last 3 Encounters:  05/21/16 139 lb 12.8 oz (63.413 kg)  10/26/15 141 lb 12.8 oz (64.32 kg)  07/04/15 112 lb 12.8 oz  (51.166 kg)     Other studies Reviewed: Additional studies/ records that were reviewed today include: Dr Julie Saunders and Dr Jackalyn Lombard office notes  Assessment and Plan:  1.  Aborted cardiac arrest Normal ICD function See Pace Art report No changes today  2.  SVT S/p ablation with no recurrence Will try to wean Propranolol - decrease to 120mg  daily.  I have advised patient that if this is tolerated then she can decrease further on her own after several weeks.  3.  Orthostatic intolerance Lifestyle modifications reviewed   4.  Shortness of breath Update echo    Current medicines are reviewed at length with the patient today.   The patient does not have concerns regarding her medicines.  The following changes were made today:  none  Labs/ tests ordered today include: echo  No orders of the defined types were placed in this encounter.     Disposition:   Follow up with Delilah Shan, Dr Julie Saunders 1 year    Signed, Chanetta Marshall, NP 05/21/2016 10:31 AM  [address] [place] [facility] [place] [address] [phone] (office) [phone] (fax)

## 2016-05-21 ENCOUNTER — Encounter (INDEPENDENT_AMBULATORY_CARE_PROVIDER_SITE_OTHER): Payer: Self-pay

## 2016-05-21 ENCOUNTER — Other Ambulatory Visit: Payer: Self-pay | Admitting: *Deleted

## 2016-05-21 ENCOUNTER — Encounter: Payer: Self-pay | Admitting: Internal Medicine

## 2016-05-21 ENCOUNTER — Encounter: Payer: Self-pay | Admitting: Nurse Practitioner

## 2016-05-21 ENCOUNTER — Ambulatory Visit (INDEPENDENT_AMBULATORY_CARE_PROVIDER_SITE_OTHER): Payer: 59 | Admitting: Nurse Practitioner

## 2016-05-21 VITALS — BP 104/64 | HR 61 | Ht 65.0 in | Wt 139.8 lb

## 2016-05-21 DIAGNOSIS — I471 Supraventricular tachycardia: Secondary | ICD-10-CM | POA: Diagnosis not present

## 2016-05-21 DIAGNOSIS — I951 Orthostatic hypotension: Secondary | ICD-10-CM

## 2016-05-21 DIAGNOSIS — R0602 Shortness of breath: Secondary | ICD-10-CM

## 2016-05-21 DIAGNOSIS — I469 Cardiac arrest, cause unspecified: Secondary | ICD-10-CM | POA: Diagnosis not present

## 2016-05-21 LAB — CUP PACEART INCLINIC DEVICE CHECK
Implantable Lead Implant Date: 20151104
Implantable Lead Location: 753859
MDC IDC LEAD IMPLANT DT: 20151104
MDC IDC LEAD LOCATION: 753860
MDC IDC SESS DTM: 20170712103441
Pulse Gen Serial Number: 7179320

## 2016-05-21 MED ORDER — PROPRANOLOL HCL ER 60 MG PO CP24: 180.0000 mg | ORAL_CAPSULE | Freq: Every day | ORAL | Status: DC

## 2016-05-21 NOTE — Patient Instructions (Addendum)
Medication Instructions:   Your physician recommends that you continue on your current medications as directed. Please refer to the Current Medication list given to you today.   If you need a refill on your cardiac medications before your next appointment, please call your pharmacy.  Labwork: NONE ORDER TODAY    Testing/Procedures:  Your physician has requested that you have an echocardiogram. Echocardiography is a painless test that uses sound waves to create images of your heart. It provides your doctor with information about the size and shape of your heart and how well your heart's chambers and valves are working. This procedure takes approximately one hour. There are no restrictions for this procedure.     Follow-Up:  Your physician wants you to follow-up in: [place] will receive a reminder letter in the mail two months in advance. If you don't receive a letter, please call our office to schedule the follow-up appointment.  Remote monitoring is used to monitor your Pacemaker of ICD from home. This monitoring reduces the number of office visits required to check your device to one time per year. It allows [place] to keep an eye on the functioning of your device to ensure it is working properly. You are scheduled for a device check from home on . 08/21/2016..You may send your transmission at any time that day. If you have a wireless device, the transmission will be sent automatically. After your physician reviews your transmission, you will receive a postcard with your next transmission date.     Any Other Special Instructions Will Be Listed Below (If Applicable).   YOU HAVE BEEN RECCOMMENDED TO TRY PROPANOLOL 120 MG FOR A FEW WEEKS TO  SEE HOW YOU ADJUST

## 2016-05-26 ENCOUNTER — Other Ambulatory Visit: Payer: Self-pay | Admitting: Internal Medicine

## 2016-06-04 ENCOUNTER — Ambulatory Visit (HOSPITAL_COMMUNITY): Payer: 59 | Attending: Cardiovascular Disease

## 2016-06-04 ENCOUNTER — Other Ambulatory Visit (HOSPITAL_COMMUNITY): Payer: Self-pay

## 2016-06-04 DIAGNOSIS — R0602 Shortness of breath: Secondary | ICD-10-CM | POA: Diagnosis not present

## 2016-06-04 DIAGNOSIS — I517 Cardiomegaly: Secondary | ICD-10-CM | POA: Insufficient documentation

## 2016-06-04 DIAGNOSIS — R0600 Dyspnea, unspecified: Secondary | ICD-10-CM | POA: Diagnosis present

## 2016-06-08 NOTE — Progress Notes (Signed)
Cardiology Office Note Date:  06/09/2016  Patient ID:  Julie, Saunders Oct 04, 1986, MRN [id number] PCP:  Donnie Coffin, MD  Electrophysiologist:  Dr. Caryl Comes   Chief Complaint:  Discuss echo results  History of Present Illness: Julie Saunders is a 30 y.o. 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   She comes in today accompanied by her father, to be seen for Dr. Caryl Comes regarding her echo result.  She was last seen by EP service by Chanetta Marshall, NP noting c/o SOB, an echo was ordered to evaluate further, noting her EF was again diminished.  At that visit given no recurrent SVTs, was planned to wean off her inderal, she has remained on 120mg  daily dose.  She mentions continued DOE, no rest SOB, no symptoms or orthopne or PND, no CP, palpitations.  She does get quite dizzy at times, "like I am on a ride and can't get off", once felt like she may faint.  This is nearly always with position change sitting to standing, lying to sitting/standing, can occur to a lighter degree while at rest, but not typically.  She has not had syncope.  She will either stay seated and breath through or try to settle the symptoms and then try to re-stand up, otherwise will lay down or sit until it passes.  She is on the Inderal 60mg  2 tabs daily, did not further decrease the dose on her own.  She describes getting winded with exertion, no real rest SOB outside of occasionally feeling the need to take a deep breath, denies symptoms of PND or orthopnea, no CP, palpitations, she has not been shocked by her device, no syncope.   Device History: STJ ICD implanted 2016 for cardiac arrest  History of appropriate therapy: no -  + inappropriate therapy for SVT, 12/08/14, BB were resumed History of AAD therapy: no   09/11/14: Procedure:epinephrine infusion observation Cx: None   EBL: Minimal   Pt received graduated epinephrine infusions at 0.05, 0.1 and0.2 mcg/kg min QTc intervals were stable at 462msec Rare ectopics Will give flecainide overnight to assess for brugada >>> Flecainide challenge negative for Brugada    Past Medical History:  Diagnosis Date  . 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 Allergy   . Bradycardia   . Cardiac arrest ([place]) 08/31/2014  . Crohn disease ([place])   . Depression   . History of blood transfusion 09/2012   "had allergic rxn to one of my Crohn's RX"   . Migraine    "a few times/yr" (06/08/2015)  . Pancytopenia ([place]) 11/23/2012  . Status post radiofrequency ablation for arrhythmia, atrial tach 06/08/15 06/08/2015  . SVT (supraventricular tachycardia) ([place]) 12/08/2014   likely an atrial tachycardia with CL 250 msec    Past Surgical History:  Procedure Laterality Date  . ELECTROPHYSIOLOGIC STUDY N/A 06/08/2015   Procedure: SVT Ablation;  Surgeon: Thompson Grayer, MD;  Location: [place] CV LAB;  Service: Cardiovascular;  Laterality: N/A;  . ELECTROPHYSIOLOGY STUDY N/A 09/11/2014   Procedure: ELECTROPHYSIOLOGY STUDY;  Surgeon: Deboraha Sprang, MD;  Location[facility] CATH LAB;  Service: Cardiovascular;  Laterality: N/A;  . IMPLANTABLE CARDIOVERTER DEFIBRILLATOR IMPLANT N/A 09/13/2014   Procedure: IMPLANTABLE CARDIOVERTER DEFIBRILLATOR IMPLANT;  Surgeon: Deboraha Sprang, MD;  Location: [facility] CATH LAB;  Service: Cardiovascular;  Laterality: N/A;; STJ dual chamber ICD implanted by Dr Caryl Comes for aborted cardiac arrest    Current Outpatient Prescriptions  Medication Sig Dispense Refill  . acetaminophen (TYLENOL) 325 MG tablet Take 650 mg by mouth every 6 (six) hours as needed for headache.    . cetirizine (ZYRTEC) 10 MG tablet Take 10 mg by mouth daily.    Marland Kitchen dicyclomine (BENTYL) 10 MG capsule Take 1 capsule by mouth as directed.    . dimenhyDRINATE  (DRAMAMINE) 50 MG tablet Take 50 mg by mouth daily as needed for dizziness.    . folic acid (FOLVITE) Q000111Q MCG tablet Take 800 mcg by mouth daily.     Marland Kitchen HUMIRA PEN 40 MG/0.8ML PNKT Inject 40 mg as directed. EVERY 14 DAYS    . KLOR-CON M20 20 MEQ tablet TAKE 1 TABLET (20 MEQ TOTAL) BY MOUTH DAILY. 90 tablet 3  . loperamide (IMODIUM A-D) 2 MG tablet Take 2 mg by mouth daily as needed for diarrhea or loose stools.     . mesalamine (PENTASA) 250 MG CR capsule Take 1,000 mg by mouth 4 (four) times daily.    . Pediatric Multiple Vit-C-FA (ANIMAL CHEWABLE MULTIVITAMIN PO) Take 1 tablet by mouth daily.    . Probiotic Product (ALIGN PO) Take 1 tablet by mouth daily.    . propranolol ER (INDERAL LA) 60 MG 24 hr capsule Take 3 capsules (180 mg total) by mouth daily. 270 capsule 3  . SUMAtriptan (IMITREX) 100 MG tablet Take 100 mg by mouth every 2 (two) hours as needed for migraine. May repeat in 2 hours if headache persists or recurs.     No current facility-administered medications for this visit.     Allergies:   Sulfa antibiotics and Sulfacetamide sodium   Social History:  The patient  reports that she has never smoked. She has never used smokeless tobacco. She reports that she drinks about 0.5 oz of alcohol per week . She reports that she does not use drugs.   Family History:  The patient's family history includes Asthma in her paternal grandmother; Diabetes in her maternal grandfather and paternal grandfather; Heart attack in her maternal grandmother and mother.  ROS:  Please see the history of present illness.  All other systems are reviewed and otherwise negative.   PHYSICAL EXAM:  VS:  BP 92/64 (BP Location: Left Arm, Patient Position: Sitting, Cuff Size: Normal)   Ht 5\' 5"  (1.651 m)   Wt 136 lb (61.7 kg)   BMI 22.63 kg/m  BMI: Body mass index is 22.63 kg/m. Well nourished, well developed, in no acute distress  HEENT: normocephalic, atraumatic  Neck: no JVD, carotid bruits or  masses Cardiac:  normal S1, S2; RRR; no significant murmurs, no rubs, or gallops Lungs:  clear to auscultation bilaterally, no wheezing, rhonchi or rales  Abd: soft, nontender MS: no deformity or atrophy Ext: no edema  Skin: warm and dry, no rash Neuro:  No gross deficits appreciated Psych: euthymic mood, full affect   ICD site is stable, no tethering or discomfort   EKG:  Done 05/21/16 noted SR, diffuse ST/T changes Device interrogation today with normal function no episodes, no therapies, underling rhythm is SR 70's  06/04/16: Echocardiogram Study Conclusions - Left ventricle: Diffuse hypokinesis worse in the inferior wall   The cavity size was moderately dilated. Wall thickness was   normal. The estimated ejection fraction was 35%. Left ventricular   diastolic function parameters were normal. - Atrial septum: No  defect or patent foramen ovale was identified.  09/12/14: Lexiscan stress test IMPRESSION: 1. No scintigraphic evidence of prior infarction or pharmacologically induced ischemia on this motion degraded examination. 2. Mild diffuse / global hypokinesia. 3. Left ventricular ejection fraction 52% 4. Low-risk stress test findings*.  09/08/14: EF 60% 08/31/14: EF 25-30%   Recent Labs: No results found for requested labs within last 8760 hours.  No results found for requested labs within last 8760 hours.   CrCl cannot be calculated (Patient's most recent lab result is older than the maximum 21 days allowed.).   Wt Readings from Last 3 Encounters:  06/09/16 136 lb (61.7 kg)  05/21/16 139 lb 12.8 oz (63.4 kg)  10/26/15 141 lb 12.8 oz (64.3 kg)     Other studies reviewed: Additional studies/records reviewed today include: summarized above  ASSESSMENT AND PLAN:  1.  Aborted cardiac arrest, Polymorphic VT, Oct. 2015      ICD       normal ICD function       No changes today       Her mother died of a presumed MI 2 years ago, though no clear cardiac or coronary  history prior to that other the MVP       Reviewed case with Dr.  Caryl Comes, he will discuss genetic testing with the patient at her next visit with him.      Discussed with the patient, [email or web address] and to check with [place] if prescribed any new meds including antibiotics etc.  2.  SVT       S/p ablation with no recurrence  3.  Orthostatic intolerance       Lifestyle modifications, instruction on changing positions slowly, not to walk away from her chair right away and to sit or lay down for symptoms, all re-discussed with her today       Baseline BP 92/64, orthostatic check negative but reported slight dizziness upon standing  4. Cardiomyopathy     Echo with low EF again     Negative stress test at the time of her arrest     Exam appears euvolemic, but c/o DOE     Reviewed with Dr. Caryl Comes, BP/othorstatic symptoms not likely to tolerate ACE or ARB add on     Start Digoxin 0.125mg  daily and lasix 20mg  daily  Discussed with the patient, she assures she is not pregnant, not sexually active and recommended not to become pregnant given cardiomyopathy now again and medicines.  She states understanding, declines pregnancy test.   Disposition: Stay on Inderal given cardiomyopathy, BMET today, F/u with BMET and dig level in 2 weeks  Current medicines are reviewed at length with the patient today.  The patient did not have any concerns regarding medicines.  Haywood Lasso, PA-C 06/09/2016 11:16 AM     [facility] [place] [address] [phone] (office)  [phone] (fax)

## 2016-06-09 ENCOUNTER — Ambulatory Visit (INDEPENDENT_AMBULATORY_CARE_PROVIDER_SITE_OTHER): Payer: 59 | Admitting: Physician Assistant

## 2016-06-09 ENCOUNTER — Encounter (INDEPENDENT_AMBULATORY_CARE_PROVIDER_SITE_OTHER): Payer: Self-pay

## 2016-06-09 ENCOUNTER — Encounter: Payer: Self-pay | Admitting: Physician Assistant

## 2016-06-09 VITALS — BP 92/64 | Ht 65.0 in | Wt 136.0 lb

## 2016-06-09 DIAGNOSIS — I469 Cardiac arrest, cause unspecified: Secondary | ICD-10-CM | POA: Diagnosis not present

## 2016-06-09 DIAGNOSIS — I429 Cardiomyopathy, unspecified: Secondary | ICD-10-CM | POA: Diagnosis not present

## 2016-06-09 DIAGNOSIS — I951 Orthostatic hypotension: Secondary | ICD-10-CM

## 2016-06-09 LAB — BASIC METABOLIC PANEL
BUN: 5 mg/dL — ABNORMAL LOW (ref 7–25)
CHLORIDE: 106 mmol/L (ref 98–110)
CO2: 25 mmol/L (ref 20–31)
Calcium: 7.7 mg/dL — ABNORMAL LOW (ref 8.6–10.2)
Creat: 0.89 mg/dL (ref 0.50–1.10)
GLUCOSE: 80 mg/dL (ref 65–99)
POTASSIUM: 3.5 mmol/L (ref 3.5–5.3)
SODIUM: 138 mmol/L (ref 135–146)

## 2016-06-09 MED ORDER — FUROSEMIDE 20 MG PO TABS: 20.0000 mg | ORAL_TABLET | Freq: Every day | ORAL | 2 refills | Status: DC

## 2016-06-09 MED ORDER — DIGOXIN 125 MCG PO TABS: 0.1250 mg | ORAL_TABLET | Freq: Every day | ORAL | 3 refills | Status: DC

## 2016-06-09 NOTE — Patient Instructions (Signed)
Medication Instructions:   START TAKING DIGOXIN 0.125 ONCE A DAY   START TAKING LASIX 20 MG ONCE A DAY   If you need a refill on your cardiac medications before your next appointment, please call your pharmacy.  Labwork: BMET TODAY   RETURN IN TWO WEEKS FOR REPEAT BMET    Testing/Procedures: NONE ORDER TODAY    Follow-Up: IN 2 MONTHS WITH DR Caryl Comes    Any Other Special Instructions Will Be Listed Below (If Applicable).

## 2016-06-10 ENCOUNTER — Other Ambulatory Visit: Payer: Self-pay | Admitting: *Deleted

## 2016-06-10 ENCOUNTER — Telehealth: Payer: Self-pay | Admitting: *Deleted

## 2016-06-10 NOTE — Telephone Encounter (Signed)
SPOKE TO PT ABOUT  LAB RESULTS AND TO START TAKING  2 K+ TABLETS EVERY OTHER DAY AND WITH HER POTASSIUM AND  CALCIUM RANGES BLOOD WORK WILL BE DRAWN TO RECHECK THOSE LEVELS  WHEN RETURN FOR LAB ON 06/23/2016

## 2016-06-10 NOTE — Telephone Encounter (Signed)
-----   Message from [place], [place] sent at 06/09/2016  6:38 PM EDT ----- Please let the patient know that her lab look OK.  Her potassium in on the lower end of of normal, to increase her Kdur (potassium) 75meq 2 tabs every other day alternating with 1 tab since we added the lasix today.  Her calcium is low, will need to recheck a free calcium (ionized calcium) level please.  Thanks [employer]

## 2016-06-13 ENCOUNTER — Encounter: Payer: Self-pay | Admitting: Internal Medicine

## 2016-06-23 ENCOUNTER — Other Ambulatory Visit: Payer: 59 | Admitting: *Deleted

## 2016-06-23 DIAGNOSIS — I429 Cardiomyopathy, unspecified: Secondary | ICD-10-CM

## 2016-06-24 LAB — BASIC METABOLIC PANEL
BUN: 8 mg/dL (ref 7–25)
CALCIUM: 7.9 mg/dL — AB (ref 8.6–10.2)
CO2: 26 mmol/L (ref 20–31)
Chloride: 101 mmol/L (ref 98–110)
Creat: 0.98 mg/dL (ref 0.50–1.10)
GLUCOSE: 78 mg/dL (ref 65–99)
Potassium: 3.5 mmol/L (ref 3.5–5.3)
SODIUM: 138 mmol/L (ref 135–146)

## 2016-06-24 LAB — CALCIUM, IONIZED: Calcium, Ion: 4.5 mg/dL — ABNORMAL LOW (ref 4.8–5.6)

## 2016-06-24 LAB — DIGOXIN LEVEL: DIGOXIN LVL: 0.9 ug/L (ref 0.8–2.0)

## 2016-07-02 ENCOUNTER — Telehealth: Payer: Self-pay | Admitting: *Deleted

## 2016-07-02 NOTE — Telephone Encounter (Signed)
SPOKE TO PT ABOUT RESULTS AND VERBALIZE UNDERSTANDING

## 2016-07-02 NOTE — Telephone Encounter (Signed)
-----   Message from [place], [place] sent at 07/01/2016  5:16 PM EDT ----- Please let the patient know her new calcium level came back mildly low, please forward to her PMD and instruct her to follow up with him.  I will discuss with Dr. Caryl Comes, and if any other recommendations will contact her.  Thanks [employer]

## 2016-07-02 NOTE — Telephone Encounter (Signed)
-----   Message from [place], [place] sent at 07/01/2016  5:16 PM EDT ----- Please let the patient know her new calcium level came back mildly low, please forward to her PMD and instruct her to follow up with him.  I will discuss with Dr. Caryl Comes, and if any other recommendations will contact her.  Thanks [employer]

## 2016-07-02 NOTE — Telephone Encounter (Signed)
-----   Message from [place], [place] sent at 07/01/2016  5:16 PM EDT ----- Please let the patient know her new calcium level came back mildly low, please forward to her PMD and instruct her to follow up with him.  I will discuss with Dr. Caryl Comes, and if any other recommendations will contact her.  Thanks [employer]

## 2016-08-15 ENCOUNTER — Encounter: Payer: Self-pay | Admitting: Internal Medicine

## 2016-08-21 ENCOUNTER — Encounter: Payer: Self-pay | Admitting: Internal Medicine

## 2016-08-21 ENCOUNTER — Ambulatory Visit (INDEPENDENT_AMBULATORY_CARE_PROVIDER_SITE_OTHER): Payer: 59 | Admitting: Internal Medicine

## 2016-08-21 ENCOUNTER — Other Ambulatory Visit: Payer: Self-pay | Admitting: *Deleted

## 2016-08-21 VITALS — BP 78/48 | HR 57 | Ht 65.0 in | Wt 115.0 lb

## 2016-08-21 DIAGNOSIS — I471 Supraventricular tachycardia: Secondary | ICD-10-CM | POA: Diagnosis not present

## 2016-08-21 DIAGNOSIS — Z95810 Presence of automatic (implantable) cardiac defibrillator: Secondary | ICD-10-CM

## 2016-08-21 DIAGNOSIS — I469 Cardiac arrest, cause unspecified: Secondary | ICD-10-CM | POA: Diagnosis not present

## 2016-08-21 DIAGNOSIS — I5022 Chronic systolic (congestive) heart failure: Secondary | ICD-10-CM

## 2016-08-21 DIAGNOSIS — I429 Cardiomyopathy, unspecified: Secondary | ICD-10-CM

## 2016-08-21 DIAGNOSIS — R001 Bradycardia, unspecified: Secondary | ICD-10-CM

## 2016-08-21 DIAGNOSIS — E876 Hypokalemia: Secondary | ICD-10-CM

## 2016-08-21 DIAGNOSIS — Z79899 Other long term (current) drug therapy: Secondary | ICD-10-CM

## 2016-08-21 DIAGNOSIS — I951 Orthostatic hypotension: Secondary | ICD-10-CM

## 2016-08-21 LAB — BASIC METABOLIC PANEL
Anion gap: 10 (ref 5–15)
BUN: 6 mg/dL (ref 6–20)
CALCIUM: 7.8 mg/dL — AB (ref 8.9–10.3)
CO2: 30 mmol/L (ref 22–32)
CREATININE: 1.34 mg/dL — AB (ref 0.44–1.00)
Chloride: 95 mmol/L — ABNORMAL LOW (ref 101–111)
GFR, EST NON AFRICAN AMERICAN: 53 mL/min — AB (ref >60)
Glucose, Bld: 99 mg/dL (ref 65–99)
Potassium: 2.9 mmol/L — ABNORMAL LOW (ref 3.5–5.1)
SODIUM: 135 mmol/L (ref 135–145)

## 2016-08-21 LAB — CUP PACEART INCLINIC DEVICE CHECK
Brady Statistic RA Percent Paced: 25 %
Brady Statistic RV Percent Paced: 1.4 %
HIGH POWER IMPEDANCE MEASURED VALUE: 77.625
Implantable Lead Implant Date: 20151104
Implantable Lead Location: 753859
Implantable Lead Location: 753860
Lead Channel Impedance Value: 337.5 Ohm
Lead Channel Pacing Threshold Amplitude: 0.75 V
Lead Channel Pacing Threshold Pulse Width: 0.4 ms
Lead Channel Sensing Intrinsic Amplitude: 11.8 mV
Lead Channel Sensing Intrinsic Amplitude: 5 mV
Lead Channel Setting Pacing Amplitude: 2 V
Lead Channel Setting Pacing Pulse Width: 0.4 ms
Lead Channel Setting Sensing Sensitivity: 0.5 mV
MDC IDC LEAD IMPLANT DT: 20151104
MDC IDC MSMT BATTERY REMAINING LONGEVITY: 80.4
MDC IDC MSMT LEADCHNL RA IMPEDANCE VALUE: 525 Ohm
MDC IDC MSMT LEADCHNL RA PACING THRESHOLD AMPLITUDE: 0.5 V
MDC IDC MSMT LEADCHNL RA PACING THRESHOLD AMPLITUDE: 0.5 V
MDC IDC MSMT LEADCHNL RA PACING THRESHOLD PULSEWIDTH: 0.4 ms
MDC IDC MSMT LEADCHNL RV PACING THRESHOLD AMPLITUDE: 0.75 V
MDC IDC MSMT LEADCHNL RV PACING THRESHOLD PULSEWIDTH: 0.4 ms
MDC IDC MSMT LEADCHNL RV PACING THRESHOLD PULSEWIDTH: 0.4 ms
MDC IDC SESS DTM: 20171012154433
MDC IDC SET LEADCHNL RV PACING AMPLITUDE: 2.5 V
Pulse Gen Serial Number: 7179320

## 2016-08-21 LAB — TSH: TSH: 3.772 u[IU]/mL (ref 0.350–4.500)

## 2016-08-21 MED ORDER — KLOR-CON 10 10 MEQ PO TBCR: EXTENDED_RELEASE_TABLET | ORAL | 6 refills | Status: DC

## 2016-08-21 NOTE — Progress Notes (Signed)
Patient Care Team: L.Donnie Coffin, MD as PCP - General (Family Medicine)   HPI  Julie Saunders is a 30 y.o. female Seen in follow-up for aborted cardiac arrest.  No specific cause was identified. She was treated with beta blockers initially but these were stopped because of hypotension and dizziness. During cardiac rehabilitation she had an episode with ICD discharges.    She has subsequently undergone EPS RFCA  And these records were reveiewed>>ectopic tachycardia successful  She has not had any intercurrent atrial arrhythmia.   The patient denies chest pain, shortness of breath, nocturnal dyspnea, orthopnea or peripheral edema.  There have been no palpitations or syncope. She has had some ort I don't look at the week we can back at appropriate time is just epidermis 19th of Dr. 29th of June is a Nira Conn could continue as catheter if she has pain cell hostatic lightheadedness.  She's lost 24 pounds in the last 3 months. She is unable to sit up without assistance.  She has had profound weight loss with anorexia  25 lbs 3 months  Last potassium level was4.2 checked by her GI doctor last month.   She remains on supplementation as opposed to Aldactone  Spoke with Dr Victoriano Lain   1/16      TSH  1.053 8/17 Cr 0.98 K 3.5 Dig 0.9  08/15/16 Cr 1.04 K 2.8           Alb 1.8  Past Medical History:  Diagnosis Date  . 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 Allergy   . Bradycardia   . Cardiac arrest ([place]) 08/31/2014  . Crohn disease ([place])   . Depression   . History of blood transfusion 09/2012   "had allergic rxn to one of my Crohn's RX"   . Migraine    "a few times/yr" (06/08/2015)  . Pancytopenia ([place]) 11/23/2012  . Status post radiofrequency ablation for arrhythmia, atrial tach 06/08/15 06/08/2015  . SVT (supraventricular tachycardia) ([place]) 12/08/2014   likely an atrial tachycardia with  CL 250 msec    Past Surgical History:  Procedure Laterality Date  . ELECTROPHYSIOLOGIC STUDY N/A 06/08/2015   Procedure: SVT Ablation;  Surgeon: Thompson Grayer, MD;  Location: [place] [facility];  Service: Cardiovascular;  Laterality: N/A;  . ELECTROPHYSIOLOGY STUDY N/A 09/11/2014   Procedure: ELECTROPHYSIOLOGY STUDY;  Surgeon: Deboraha Sprang, MD;  Location: [facility] CATH LAB;  Service: Cardiovascular;  Laterality: N/A;  . IMPLANTABLE CARDIOVERTER DEFIBRILLATOR IMPLANT N/A 09/13/2014   Procedure: IMPLANTABLE CARDIOVERTER DEFIBRILLATOR IMPLANT;  Surgeon: Deboraha Sprang, MD;  Location: [facility] CATH LAB;  Service: Cardiovascular;  Laterality: N/A;; STJ dual chamber ICD implanted by Dr Caryl Comes for aborted cardiac arrest    Current Outpatient Prescriptions  Medication Sig Dispense Refill  . acetaminophen (TYLENOL) 325 MG tablet Take 650 mg by mouth every 6 (six) hours as needed for headache.    . cetirizine (ZYRTEC) 10 MG tablet Take 10 mg by mouth daily.    Marland Kitchen dicyclomine (BENTYL) 10 MG capsule Take 1 capsule by mouth as directed.    . digoxin (LANOXIN) 0.125 MG tablet Take 1 tablet (0.125 mg total) by mouth daily. 30 tablet 3  . dimenhyDRINATE (DRAMAMINE) 50 MG tablet Take 50 mg by mouth daily as needed for dizziness.    . folic acid (FOLVITE) 737 MCG tablet Take 800 mcg by mouth daily.     . furosemide (  LASIX) 20 MG tablet Take 1 tablet (20 mg total) by mouth daily. 30 tablet 2  . HUMIRA PEN 40 MG/0.8ML PNKT Inject 40 mg as directed. EVERY 14 DAYS    . KLOR-CON M20 20 MEQ tablet TAKE 1 TABLET (20 MEQ TOTAL) BY MOUTH DAILY. 90 tablet 3  . loperamide (IMODIUM A-D) 2 MG tablet Take 2 mg by mouth daily as needed for diarrhea or loose stools.     . mesalamine (PENTASA) 250 MG CR capsule Take 1,000 mg by mouth 4 (four) times daily.    . Pediatric Multiple Vit-C-FA (ANIMAL CHEWABLE MULTIVITAMIN PO) Take 1 tablet by mouth daily.    . propranolol ER (INDERAL LA) 60 MG 24 hr capsule Take 3 capsules (180 mg total) by  mouth daily. (Patient taking differently: Take 120 mg by mouth daily. ) 270 capsule 3  . SUMAtriptan (IMITREX) 100 MG tablet Take 100 mg by mouth every 2 (two) hours as needed for migraine. May repeat in 2 hours if headache persists or recurs.     No current facility-administered medications for this visit.     Allergies  Allergen Reactions  . Sulfa Antibiotics Rash  . Sulfacetamide Sodium Rash    Review of Systems negative except from HPI and PMH  Physical Exam BP (!) 78/48   Pulse (!) 57   Ht 5' 5"  (1.651 m)   Wt 115 lb (52.2 kg)   BMI 19.14 kg/m   Well developed and cachectic  in no acute distress HENT normal E scleral and icterus clear Neck Supple JVP flat; carotids brisk and full Clear to ausculation Device pocket well healed; without hematoma or erythema.  There is no tethering   Regular rate and rhythm, no murmurs gallops or rub Soft with active bowel sounds No clubbing cyanosis1+ Edema Alert and oriented, grossly normal motor and sensory function Skin Warm and Dry    ECG 8/16 demonstrates sinus rhythm at 61 Intervals  14/09/41 Right axis deviation ST-T changes inferolaterally    Assessment and  Plan Aborted cardiac arrest  ICD St Jude   Supraventricular tachycardia  status post RCA-JA  Orthostatic lightheadedness  Crohn's disease  Hypoalbuminemia   Hypokalemia  No intercurrent Ventricular tachycardia  Weakness and her hypotension may be related. We will discontinue her beta blocker at this time.  Her malnutrition is a huge issue with her albumin level at 1.8. I've encouraged her to increase her protein intake by  [facility] or by crook  With her hypokalemia, we will recheck it.  We will also replete it and stop dig which becomes higher risk  Her edema is now likely more related to her albumin than her heart and we will discontinue her diuretics     Reviewing her family history makes me concerned that this is an inherited cardiomyopathy.  Right  now her blood pressure precludes guidelines directed therapy.  She is scheduled to undergo endoscopy/colonoscopy next week. Given her weakness, that may need to be reconsidered.  I will defer that to Dr. Kathline Magic expertise

## 2016-08-21 NOTE — Patient Instructions (Addendum)
Medication Instructions:    Your physician has recommended you make the following change in your medication:   1) STOP Propranolol -- take one tablet once daily for 5 days, then take it once   every other day for 5 days, then you may stop the medication.  2) STOP Digoxin  --- If you need a refill on your cardiac medications before your next appointment, please call your pharmacy. ---  Labwork:  Today: BMET & TSH  Testing/Procedures: Your physician has requested that you have an echocardiogram. Echocardiography is a painless test that uses sound waves to create images of your heart. It provides your doctor with information about the size and shape of your heart and how well your heart's chambers and valves are working. This procedure takes approximately one hour. There are no restrictions for this procedure.  Follow-Up: Remote monitoring is used to monitor your Pacemaker of ICD from home. This monitoring reduces the number of office visits required to check your device to one time per year. It allows [place] to keep an eye on the functioning of your device to ensure it is working properly. You are scheduled for a device check from home on 11/20/2016. You may send your transmission at any time that day. If you have a wireless device, the transmission will be sent automatically. After your physician reviews your transmission, you will receive a postcard with your next transmission date.   Your physician wants you to follow-up in: 1 year with Dr. Caryl Comes.  You will receive a reminder letter in the mail two months in advance. If you don't receive a letter, please call our office to schedule the follow-up appointment.  Thank you for choosing [facility]!!

## 2016-08-22 ENCOUNTER — Telehealth: Payer: Self-pay | Admitting: Internal Medicine

## 2016-08-22 NOTE — Telephone Encounter (Signed)
New message      Pt c/o medication issue:  1. Name of Medication: potassium  2. How are you currently taking this medication (dosage and times per day)? 30mq  3. Are you having a reaction (difficulty breathing--STAT)? no 4. What is your medication issue? Pt need someone to go over the directions for taking this medication with her.  She said she lost her "paper"

## 2016-08-22 NOTE — Telephone Encounter (Signed)
Informed patient of her instructions about taking her potassium. Had patient repeat instructions. Patient also inquired about her furosemide. Reviewed Dr. Olin Pia office visit note and found that patient is to stop her diuretic too. Will route to Dr. Olin Pia nurse so she is aware.   Notes Recorded by Stanton Kidney, RN on 08/21/2016 at 5:44 PM EDT Reviewed w/ Dr. Caryl Comes --- order to:   - take 40 mEq BID x 2 days, then take 20 mEq BID x 2 days, then take 20 mEq once daily.   - recheck BMET next Monday Reviewed instructions with patient. She is agreeable to plan. She is coming on Tuesday for follow up lab   Per Dr. Olin Pia office visit note "Her edema is now likely more related to her albumin than her heart and we will discontinue her diuretics "

## 2016-08-26 ENCOUNTER — Other Ambulatory Visit: Payer: 59 | Admitting: *Deleted

## 2016-08-26 ENCOUNTER — Other Ambulatory Visit: Payer: Self-pay | Admitting: Gastroenterology

## 2016-08-26 DIAGNOSIS — K5080 Crohn's disease of both small and large intestine without complications: Secondary | ICD-10-CM

## 2016-08-26 DIAGNOSIS — R1032 Left lower quadrant pain: Secondary | ICD-10-CM

## 2016-08-26 DIAGNOSIS — E876 Hypokalemia: Secondary | ICD-10-CM

## 2016-08-27 ENCOUNTER — Ambulatory Visit (HOSPITAL_COMMUNITY): Admit: 2016-08-27 | Payer: 59 | Admitting: Gastroenterology

## 2016-08-27 ENCOUNTER — Encounter (HOSPITAL_COMMUNITY): Payer: Self-pay

## 2016-08-27 LAB — BASIC METABOLIC PANEL
BUN: 7 mg/dL (ref 7–25)
CALCIUM: 7.6 mg/dL — AB (ref 8.6–10.2)
CHLORIDE: 102 mmol/L (ref 98–110)
CO2: 22 mmol/L (ref 20–31)
CREATININE: 0.89 mg/dL (ref 0.50–1.10)
Glucose, Bld: 74 mg/dL (ref 65–99)
Potassium: 4.2 mmol/L (ref 3.5–5.3)
Sodium: 137 mmol/L (ref 135–146)

## 2016-08-27 SURGERY — COLONOSCOPY WITH PROPOFOL
Anesthesia: Monitor Anesthesia Care

## 2016-09-03 ENCOUNTER — Ambulatory Visit
Admission: RE | Admit: 2016-09-03 | Discharge: 2016-09-03 | Disposition: A | Discharge status: Home / Self Care | Payer: 59 | Source: Ambulatory Visit | Attending: Gastroenterology | Admitting: Gastroenterology

## 2016-09-03 DIAGNOSIS — R1032 Left lower quadrant pain: Secondary | ICD-10-CM

## 2016-09-03 DIAGNOSIS — K5080 Crohn's disease of both small and large intestine without complications: Secondary | ICD-10-CM

## 2016-09-04 ENCOUNTER — Ambulatory Visit (HOSPITAL_COMMUNITY): Payer: 59 | Attending: Cardiology

## 2016-09-04 DIAGNOSIS — I951 Orthostatic hypotension: Secondary | ICD-10-CM | POA: Insufficient documentation

## 2016-09-04 DIAGNOSIS — I471 Supraventricular tachycardia: Secondary | ICD-10-CM | POA: Diagnosis not present

## 2016-09-04 DIAGNOSIS — Z95810 Presence of automatic (implantable) cardiac defibrillator: Secondary | ICD-10-CM | POA: Diagnosis not present

## 2016-09-04 DIAGNOSIS — I429 Cardiomyopathy, unspecified: Secondary | ICD-10-CM | POA: Insufficient documentation

## 2016-09-04 DIAGNOSIS — I469 Cardiac arrest, cause unspecified: Secondary | ICD-10-CM | POA: Diagnosis not present

## 2016-09-04 DIAGNOSIS — I5022 Chronic systolic (congestive) heart failure: Secondary | ICD-10-CM | POA: Diagnosis present

## 2016-09-04 DIAGNOSIS — R001 Bradycardia, unspecified: Secondary | ICD-10-CM | POA: Insufficient documentation

## 2016-09-04 DIAGNOSIS — Z79899 Other long term (current) drug therapy: Secondary | ICD-10-CM | POA: Insufficient documentation

## 2016-09-04 DIAGNOSIS — I351 Nonrheumatic aortic (valve) insufficiency: Secondary | ICD-10-CM | POA: Insufficient documentation

## 2016-09-09 ENCOUNTER — Telehealth: Payer: Self-pay

## 2016-09-09 NOTE — Telephone Encounter (Signed)
LMTCB on home phone  Spoke to patient on cell phone to discuss lab work and echo results. Pt is aware and agreeable to normal labs and unreadable ECHO. Pt informed me that eating is getting better. She is working with a Automotive engineer and had been encouraged to eat a small meal or a snack ever 2-3 hours. She says that most days she tries but isn't always successful. She does not keep up with her caloric intake but she is trying to push more fluids. She feels she is making progress slowly but surely.   Informed patient that she will need to have another echo done with contrast and asked pt availability. She says that 11/6, 11/9, or 11/10 in the afternoon after 1:00 pm will work due to her transportation situation. I told her I would forward this message back to [facility] and Dr. Caryl Comes for scheduling. She was agreeable.

## 2016-09-10 ENCOUNTER — Other Ambulatory Visit: Payer: Self-pay | Admitting: *Deleted

## 2016-09-10 DIAGNOSIS — I5022 Chronic systolic (congestive) heart failure: Secondary | ICD-10-CM

## 2016-09-11 ENCOUNTER — Telehealth: Payer: Self-pay | Admitting: Internal Medicine

## 2016-09-11 NOTE — Telephone Encounter (Signed)
Patient has been experiencing dizziness and heart rate fluctuations and wants to know if she should be seen for that.  She has scheduled her echo for 09-30-16.

## 2016-09-11 NOTE — Telephone Encounter (Signed)
Spoke with patient regarding her symptoms. Remote transmission does not show anything of concern. I informed patient of this. She reports a continued struggle with food and fluid intake. She was recently taken off propanolol and digoxin in relation to similar symptoms. I advised her that at this point I didn't not see anything on her device that would correlate with her symptoms. I encouraged a better dietary intake. I recommended calling her primary care provider if she felt the need for further evaluation or to call back if her symptoms worsened.

## 2016-09-11 NOTE — Telephone Encounter (Signed)
Patient stated she was feeling dizzy and had a racing heart episodes serveral times on Tuesday and Wednesday. Patient also stated that the dizziness happened with changing positions. Patient stated that she had not eaten when these episodes happen, but when she did finally drink and eat a little her symptoms improved. Patient denied having any chest pain or SOB. Patient stated she is feeling fine today. Patient did not have any blood pressures or heart rates to report. Patient has a device, will have patient send a remote check in to see if these episodes show on her results. Will forward to Device clinic and Dr. Addison Lank nurse.

## 2016-09-30 ENCOUNTER — Other Ambulatory Visit (HOSPITAL_COMMUNITY): Payer: 59

## 2016-10-29 ENCOUNTER — Ambulatory Visit (HOSPITAL_COMMUNITY): Payer: 59 | Attending: Internal Medicine

## 2016-10-29 ENCOUNTER — Other Ambulatory Visit: Payer: Self-pay

## 2016-10-29 DIAGNOSIS — I509 Heart failure, unspecified: Secondary | ICD-10-CM | POA: Diagnosis present

## 2016-10-29 DIAGNOSIS — I517 Cardiomegaly: Secondary | ICD-10-CM | POA: Insufficient documentation

## 2016-10-29 DIAGNOSIS — I5022 Chronic systolic (congestive) heart failure: Secondary | ICD-10-CM | POA: Diagnosis not present

## 2016-10-29 MED ORDER — PERFLUTREN LIPID MICROSPHERE
1.0000 mL | INTRAVENOUS | Status: AC | PRN
  Administered 2016-10-29: 1 mL via INTRAVENOUS

## 2016-10-30 ENCOUNTER — Other Ambulatory Visit (HOSPITAL_COMMUNITY)
Admission: AD | Admit: 2016-10-30 | Discharge: 2016-10-30 | Disposition: A | Discharge status: Home / Self Care | Payer: 59 | Source: Skilled Nursing Facility | Attending: Internal Medicine | Admitting: Internal Medicine

## 2016-10-30 DIAGNOSIS — I10 Essential (primary) hypertension: Secondary | ICD-10-CM | POA: Diagnosis present

## 2016-10-30 LAB — PHOSPHORUS: Phosphorus: 4 mg/dL (ref 2.5–4.6)

## 2016-10-30 LAB — BASIC METABOLIC PANEL
ANION GAP: 5 (ref 5–15)
BUN: 33 mg/dL — ABNORMAL HIGH (ref 6–20)
CALCIUM: 8.9 mg/dL (ref 8.9–10.3)
CO2: 27 mmol/L (ref 22–32)
Chloride: 107 mmol/L (ref 101–111)
Creatinine, Ser: 0.72 mg/dL (ref 0.44–1.00)
Glucose, Bld: 96 mg/dL (ref 65–99)
POTASSIUM: 4.3 mmol/L (ref 3.5–5.1)
Sodium: 139 mmol/L (ref 135–145)

## 2016-10-30 LAB — MAGNESIUM: Magnesium: 1.7 mg/dL (ref 1.7–2.4)

## 2016-11-04 ENCOUNTER — Other Ambulatory Visit (HOSPITAL_COMMUNITY)
Admission: AD | Admit: 2016-11-04 | Discharge: 2016-11-04 | Disposition: A | Discharge status: Home / Self Care | Payer: 59 | Source: Skilled Nursing Facility | Attending: Family Medicine | Admitting: Family Medicine

## 2016-11-04 DIAGNOSIS — I10 Essential (primary) hypertension: Secondary | ICD-10-CM | POA: Insufficient documentation

## 2016-11-04 DIAGNOSIS — K5090 Crohn's disease, unspecified, without complications: Secondary | ICD-10-CM | POA: Diagnosis present

## 2016-11-04 LAB — CBC
HEMATOCRIT: 24.6 % — AB (ref 36.0–46.0)
HEMOGLOBIN: 7.8 g/dL — AB (ref 12.0–15.0)
MCH: 29.4 pg (ref 26.0–34.0)
MCHC: 31.7 g/dL (ref 30.0–36.0)
MCV: 92.8 fL (ref 78.0–100.0)
Platelets: 344 10*3/uL (ref 150–400)
RBC: 2.65 MIL/uL — AB (ref 3.87–5.11)
RDW: 19.1 % — ABNORMAL HIGH (ref 11.5–15.5)
WBC: 11.2 10*3/uL — AB (ref 4.0–10.5)

## 2016-11-04 LAB — BASIC METABOLIC PANEL
ANION GAP: 9 (ref 5–15)
BUN: 35 mg/dL — ABNORMAL HIGH (ref 6–20)
CO2: 21 mmol/L — ABNORMAL LOW (ref 22–32)
Calcium: 8.5 mg/dL — ABNORMAL LOW (ref 8.9–10.3)
Chloride: 103 mmol/L (ref 101–111)
Creatinine, Ser: 0.86 mg/dL (ref 0.44–1.00)
GFR calc Af Amer: >60 mL/min (ref >60)
GLUCOSE: 108 mg/dL — AB (ref 65–99)
POTASSIUM: 4.5 mmol/L (ref 3.5–5.1)
Sodium: 133 mmol/L — ABNORMAL LOW (ref 135–145)

## 2016-11-04 LAB — HEPATIC FUNCTION PANEL
ALBUMIN: 3 g/dL — AB (ref 3.5–5.0)
ALK PHOS: 37 U/L — AB (ref 38–126)
ALT: 22 U/L (ref 14–54)
AST: 21 U/L (ref 15–41)
BILIRUBIN TOTAL: 0.3 mg/dL (ref 0.3–1.2)
Bilirubin, Direct: <0.1 mg/dL — ABNORMAL LOW (ref 0.1–0.5)
Total Protein: 5.9 g/dL — ABNORMAL LOW (ref 6.5–8.1)

## 2016-11-04 LAB — PHOSPHORUS: Phosphorus: 3.7 mg/dL (ref 2.5–4.6)

## 2016-11-04 LAB — MAGNESIUM: Magnesium: 1.6 mg/dL — ABNORMAL LOW (ref 1.7–2.4)

## 2016-11-06 ENCOUNTER — Other Ambulatory Visit (HOSPITAL_COMMUNITY)
Admission: RE | Admit: 2016-11-06 | Discharge: 2016-11-06 | Disposition: A | Discharge status: Home / Self Care | Payer: 59 | Source: Other Acute Inpatient Hospital | Attending: Family Medicine | Admitting: Family Medicine

## 2016-11-06 DIAGNOSIS — K5090 Crohn's disease, unspecified, without complications: Secondary | ICD-10-CM | POA: Diagnosis present

## 2016-11-06 LAB — BASIC METABOLIC PANEL
Anion gap: 7 (ref 5–15)
BUN: 32 mg/dL — AB (ref 6–20)
CALCIUM: 8.6 mg/dL — AB (ref 8.9–10.3)
CO2: 25 mmol/L (ref 22–32)
CREATININE: 0.87 mg/dL (ref 0.44–1.00)
Chloride: 106 mmol/L (ref 101–111)
GFR calc Af Amer: >60 mL/min (ref >60)
GLUCOSE: 82 mg/dL (ref 65–99)
Potassium: 4.3 mmol/L (ref 3.5–5.1)
Sodium: 138 mmol/L (ref 135–145)

## 2016-11-06 LAB — PHOSPHORUS: Phosphorus: 4.4 mg/dL (ref 2.5–4.6)

## 2016-11-06 LAB — CBC
HCT: 24.3 % — ABNORMAL LOW (ref 36.0–46.0)
Hemoglobin: 7.7 g/dL — ABNORMAL LOW (ref 12.0–15.0)
MCH: 29.5 pg (ref 26.0–34.0)
MCHC: 31.7 g/dL (ref 30.0–36.0)
MCV: 93.1 fL (ref 78.0–100.0)
PLATELETS: 316 10*3/uL (ref 150–400)
RBC: 2.61 MIL/uL — ABNORMAL LOW (ref 3.87–5.11)
RDW: 19.3 % — AB (ref 11.5–15.5)
WBC: 13.1 10*3/uL — ABNORMAL HIGH (ref 4.0–10.5)

## 2016-11-06 LAB — FERRITIN: FERRITIN: 59 ng/mL (ref 11–307)

## 2016-11-06 LAB — MAGNESIUM: Magnesium: 1.7 mg/dL (ref 1.7–2.4)

## 2016-11-10 DIAGNOSIS — K5080 Crohn's disease of both small and large intestine without complications: Secondary | ICD-10-CM | POA: Diagnosis not present

## 2016-11-10 DIAGNOSIS — E43 Unspecified severe protein-calorie malnutrition: Secondary | ICD-10-CM | POA: Diagnosis not present

## 2016-11-11 DIAGNOSIS — E43 Unspecified severe protein-calorie malnutrition: Secondary | ICD-10-CM | POA: Diagnosis not present

## 2016-11-11 DIAGNOSIS — K5080 Crohn's disease of both small and large intestine without complications: Secondary | ICD-10-CM | POA: Diagnosis not present

## 2016-11-11 DIAGNOSIS — Z452 Encounter for adjustment and management of vascular access device: Secondary | ICD-10-CM | POA: Diagnosis not present

## 2016-11-12 DIAGNOSIS — E43 Unspecified severe protein-calorie malnutrition: Secondary | ICD-10-CM | POA: Diagnosis not present

## 2016-11-12 DIAGNOSIS — K5080 Crohn's disease of both small and large intestine without complications: Secondary | ICD-10-CM | POA: Diagnosis not present

## 2016-11-13 DIAGNOSIS — E43 Unspecified severe protein-calorie malnutrition: Secondary | ICD-10-CM | POA: Diagnosis not present

## 2016-11-13 DIAGNOSIS — K5080 Crohn's disease of both small and large intestine without complications: Secondary | ICD-10-CM | POA: Diagnosis not present

## 2016-11-14 DIAGNOSIS — K5080 Crohn's disease of both small and large intestine without complications: Secondary | ICD-10-CM | POA: Diagnosis not present

## 2016-11-14 DIAGNOSIS — Z452 Encounter for adjustment and management of vascular access device: Secondary | ICD-10-CM | POA: Diagnosis not present

## 2016-11-14 DIAGNOSIS — E43 Unspecified severe protein-calorie malnutrition: Secondary | ICD-10-CM | POA: Diagnosis not present

## 2016-11-15 DIAGNOSIS — K5080 Crohn's disease of both small and large intestine without complications: Secondary | ICD-10-CM | POA: Diagnosis not present

## 2016-11-15 DIAGNOSIS — E43 Unspecified severe protein-calorie malnutrition: Secondary | ICD-10-CM | POA: Diagnosis not present

## 2016-11-16 DIAGNOSIS — E43 Unspecified severe protein-calorie malnutrition: Secondary | ICD-10-CM | POA: Diagnosis not present

## 2016-11-16 DIAGNOSIS — K5080 Crohn's disease of both small and large intestine without complications: Secondary | ICD-10-CM | POA: Diagnosis not present

## 2016-11-17 DIAGNOSIS — K5080 Crohn's disease of both small and large intestine without complications: Secondary | ICD-10-CM | POA: Diagnosis not present

## 2016-11-17 DIAGNOSIS — Z452 Encounter for adjustment and management of vascular access device: Secondary | ICD-10-CM | POA: Diagnosis not present

## 2016-11-17 DIAGNOSIS — E43 Unspecified severe protein-calorie malnutrition: Secondary | ICD-10-CM | POA: Diagnosis not present

## 2016-11-18 DIAGNOSIS — K5080 Crohn's disease of both small and large intestine without complications: Secondary | ICD-10-CM | POA: Diagnosis not present

## 2016-11-18 DIAGNOSIS — Z8674 Personal history of sudden cardiac arrest: Secondary | ICD-10-CM | POA: Diagnosis not present

## 2016-11-18 DIAGNOSIS — R350 Frequency of micturition: Secondary | ICD-10-CM | POA: Diagnosis not present

## 2016-11-18 DIAGNOSIS — E43 Unspecified severe protein-calorie malnutrition: Secondary | ICD-10-CM | POA: Diagnosis not present

## 2016-11-19 DIAGNOSIS — E43 Unspecified severe protein-calorie malnutrition: Secondary | ICD-10-CM | POA: Diagnosis not present

## 2016-11-19 DIAGNOSIS — K5080 Crohn's disease of both small and large intestine without complications: Secondary | ICD-10-CM | POA: Diagnosis not present

## 2016-11-20 ENCOUNTER — Ambulatory Visit (INDEPENDENT_AMBULATORY_CARE_PROVIDER_SITE_OTHER): Payer: 59 | Admitting: *Deleted

## 2016-11-20 DIAGNOSIS — I469 Cardiac arrest, cause unspecified: Secondary | ICD-10-CM

## 2016-11-20 NOTE — Progress Notes (Signed)
Remote ICD transmission.   

## 2016-11-21 ENCOUNTER — Encounter: Payer: Self-pay | Admitting: Cardiology

## 2016-11-24 DIAGNOSIS — K5080 Crohn's disease of both small and large intestine without complications: Secondary | ICD-10-CM | POA: Diagnosis not present

## 2016-11-24 DIAGNOSIS — Z452 Encounter for adjustment and management of vascular access device: Secondary | ICD-10-CM | POA: Diagnosis not present

## 2016-11-24 DIAGNOSIS — E43 Unspecified severe protein-calorie malnutrition: Secondary | ICD-10-CM | POA: Diagnosis not present

## 2016-12-03 DIAGNOSIS — E43 Unspecified severe protein-calorie malnutrition: Secondary | ICD-10-CM | POA: Diagnosis not present

## 2016-12-03 DIAGNOSIS — K5080 Crohn's disease of both small and large intestine without complications: Secondary | ICD-10-CM | POA: Diagnosis not present

## 2016-12-03 DIAGNOSIS — Z452 Encounter for adjustment and management of vascular access device: Secondary | ICD-10-CM | POA: Diagnosis not present

## 2016-12-04 LAB — CUP PACEART REMOTE DEVICE CHECK
Battery Remaining Longevity: 73 mo
Battery Remaining Percentage: 73 %
Battery Voltage: 2.96 V
Brady Statistic AP VS Percent: 6 %
Brady Statistic RA Percent Paced: 4.5 %
Date Time Interrogation Session: 20180111163806
HIGH POWER IMPEDANCE MEASURED VALUE: 41 Ohm
HIGH POWER IMPEDANCE MEASURED VALUE: 41 Ohm
Implantable Lead Location: 753859
Implantable Lead Location: 753860
Implantable Lead Model: 5076
Lead Channel Impedance Value: 300 Ohm
Lead Channel Pacing Threshold Amplitude: 0.75 V
Lead Channel Pacing Threshold Pulse Width: 0.4 ms
Lead Channel Sensing Intrinsic Amplitude: 8 mV
Lead Channel Setting Pacing Amplitude: 2 V
Lead Channel Setting Pacing Pulse Width: 0.4 ms
Lead Channel Setting Sensing Sensitivity: 0.5 mV
MDC IDC LEAD IMPLANT DT: 20151104
MDC IDC LEAD IMPLANT DT: 20151104
MDC IDC MSMT LEADCHNL RA IMPEDANCE VALUE: 440 Ohm
MDC IDC MSMT LEADCHNL RA PACING THRESHOLD AMPLITUDE: 0.5 V
MDC IDC MSMT LEADCHNL RA SENSING INTR AMPL: 3.5 mV
MDC IDC MSMT LEADCHNL RV PACING THRESHOLD PULSEWIDTH: 0.4 ms
MDC IDC PG IMPLANT DT: 20151104
MDC IDC SET LEADCHNL RV PACING AMPLITUDE: 2.5 V
MDC IDC STAT BRADY AP VP PERCENT: 1 %
MDC IDC STAT BRADY AS VP PERCENT: 1 %
MDC IDC STAT BRADY AS VS PERCENT: 91 %
MDC IDC STAT BRADY RV PERCENT PACED: 1 %
Pulse Gen Serial Number: 7179320

## 2016-12-10 DIAGNOSIS — E43 Unspecified severe protein-calorie malnutrition: Secondary | ICD-10-CM | POA: Diagnosis not present

## 2016-12-10 DIAGNOSIS — K5080 Crohn's disease of both small and large intestine without complications: Secondary | ICD-10-CM | POA: Diagnosis not present

## 2016-12-10 DIAGNOSIS — Z452 Encounter for adjustment and management of vascular access device: Secondary | ICD-10-CM | POA: Diagnosis not present

## 2016-12-16 DIAGNOSIS — Z452 Encounter for adjustment and management of vascular access device: Secondary | ICD-10-CM | POA: Diagnosis not present

## 2016-12-16 DIAGNOSIS — Z8249 Family history of ischemic heart disease and other diseases of the circulatory system: Secondary | ICD-10-CM | POA: Diagnosis not present

## 2016-12-16 DIAGNOSIS — K5080 Crohn's disease of both small and large intestine without complications: Secondary | ICD-10-CM | POA: Diagnosis not present

## 2016-12-18 DIAGNOSIS — E43 Unspecified severe protein-calorie malnutrition: Secondary | ICD-10-CM | POA: Diagnosis not present

## 2016-12-18 DIAGNOSIS — K5080 Crohn's disease of both small and large intestine without complications: Secondary | ICD-10-CM | POA: Diagnosis not present

## 2016-12-18 DIAGNOSIS — Z452 Encounter for adjustment and management of vascular access device: Secondary | ICD-10-CM | POA: Diagnosis not present

## 2016-12-22 DIAGNOSIS — E43 Unspecified severe protein-calorie malnutrition: Secondary | ICD-10-CM | POA: Diagnosis not present

## 2016-12-22 DIAGNOSIS — K5080 Crohn's disease of both small and large intestine without complications: Secondary | ICD-10-CM | POA: Diagnosis not present

## 2016-12-22 DIAGNOSIS — Z452 Encounter for adjustment and management of vascular access device: Secondary | ICD-10-CM | POA: Diagnosis not present

## 2017-01-07 ENCOUNTER — Telehealth: Payer: Self-pay | Admitting: Internal Medicine

## 2017-01-07 NOTE — Telephone Encounter (Signed)
Patient calling stating that her gastroenterologist Dr. Lizbeth Bark at [facility] would like for Dr. Caryl Comes to follow and manage her potassium and kidney function. She states that her potassium was 4.0 and her Cr was 1.16 on February 6th. Message forwarded to Dr. Caryl Comes.

## 2017-01-07 NOTE — Telephone Encounter (Signed)
New message   Pt verbalized that she is calling to ask rn questions and did not want to disclose

## 2017-01-08 NOTE — Telephone Encounter (Signed)
I am glad to work along with her PCP to do so

## 2017-01-08 NOTE — Telephone Encounter (Signed)
Follow up      When does Dr Caryl Comes want pt to have labs drawn and when would he like to see her again?  Please call

## 2017-01-08 NOTE — Telephone Encounter (Signed)
Will forward to Dr. Caryl Comes to advise when he would like her labs re-drawn. She was not due to come back until October (1 year f/u). Does this need to be different?

## 2017-01-09 DIAGNOSIS — R197 Diarrhea, unspecified: Secondary | ICD-10-CM | POA: Diagnosis not present

## 2017-01-09 NOTE — Telephone Encounter (Signed)
bmet in 2 months

## 2017-01-14 NOTE — Telephone Encounter (Signed)
Does her follow up need to be any different than October?

## 2017-01-15 NOTE — Telephone Encounter (Signed)
Lets have her see AS 4 /18 and me 6 months later

## 2017-01-16 NOTE — Telephone Encounter (Signed)
I called and spoke with the patient. I advise her that per Dr. Caryl Comes we will recheck her BMP in about 2 months, but he also wants her to see Chanetta [place], NP in April, so we will check her labs the day she comes in to see Amber.  She is aware that Dr. Caryl Comes will see her back in October.  She is agreeable.

## 2017-02-05 DIAGNOSIS — Z713 Dietary counseling and surveillance: Secondary | ICD-10-CM | POA: Diagnosis not present

## 2017-02-10 DIAGNOSIS — I471 Supraventricular tachycardia: Secondary | ICD-10-CM | POA: Diagnosis not present

## 2017-02-10 DIAGNOSIS — E876 Hypokalemia: Secondary | ICD-10-CM | POA: Diagnosis not present

## 2017-02-10 DIAGNOSIS — K5080 Crohn's disease of both small and large intestine without complications: Secondary | ICD-10-CM | POA: Diagnosis not present

## 2017-02-19 DIAGNOSIS — K50919 Crohn's disease, unspecified, with unspecified complications: Secondary | ICD-10-CM | POA: Diagnosis not present

## 2017-02-19 DIAGNOSIS — K51919 Ulcerative colitis, unspecified with unspecified complications: Secondary | ICD-10-CM | POA: Diagnosis not present

## 2017-02-23 NOTE — Progress Notes (Signed)
Electrophysiology Office Note Date: 02/25/2017  ID:  Julie Saunders, DOB 19-Nov-1985, MRN [id number]  PCP: Donnie Coffin, MD Electrophysiologist: Caryl Comes  CC: Routine ICD follow-up  Julie Saunders is a 31 y.o. female seen today for Dr Caryl Comes.  She presents today for routine electrophysiology followup.  Since last being seen in our clinic, the patient reports doing reasonably well. She had a Chron's flare up and required short term TPN.  She is much improved now. Her orthostatic intolerance is stable. LE and shortness of breath is improved. She has rare non sustained palpitations. She denies chest pain, PND, orthopnea, nausea, vomiting,weight gain, or early satiety.  She has not had ICD shocks.   Device History: STJ ICD implanted 2016 for cardiac arrest  History of appropriate therapy: no - inappropriate therapy for SVT History of AAD therapy: no   Past Medical History:  Diagnosis Date  . 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 Allergy   . Bradycardia   . Cardiac arrest ([place]) 08/31/2014  . Crohn disease ([place])   . Depression   . History of blood transfusion 09/2012   "had allergic rxn to one of my Crohn's RX"   . Migraine    "a few times/yr" (06/08/2015)  . Pancytopenia ([place]) 11/23/2012  . Status post radiofrequency ablation for arrhythmia, atrial tach 06/08/15 06/08/2015  . SVT (supraventricular tachycardia) ([place]) 12/08/2014   likely an atrial tachycardia with CL 250 msec   Past Surgical History:  Procedure Laterality Date  . ELECTROPHYSIOLOGIC STUDY N/A 06/08/2015   Procedure: SVT Ablation;  Surgeon: Thompson Grayer, MD;  Location: [place] CV LAB;  Service: Cardiovascular;  Laterality: N/A;  . ELECTROPHYSIOLOGY STUDY N/A 09/11/2014   Procedure: ELECTROPHYSIOLOGY STUDY;  Surgeon: Deboraha Sprang, MD;  Location: [facility] CATH LAB;  Service: Cardiovascular;  Laterality: N/A;  . IMPLANTABLE  CARDIOVERTER DEFIBRILLATOR IMPLANT N/A 09/13/2014   Procedure: IMPLANTABLE CARDIOVERTER DEFIBRILLATOR IMPLANT;  Surgeon: Deboraha Sprang, MD;  Location: [facility] CATH LAB;  Service: Cardiovascular;  Laterality: N/A;; STJ dual chamber ICD implanted by Dr Caryl Comes for aborted cardiac arrest    Current Outpatient Prescriptions  Medication Sig Dispense Refill  . acetaminophen (TYLENOL) 325 MG tablet Take 650 mg by mouth every 6 (six) hours as needed for headache.    . calcium carbonate (CALCIUM 600) 600 MG TABS tablet Take 1,500 mg by mouth daily.    . cetirizine (ZYRTEC) 10 MG tablet Take 10 mg by mouth daily.    Marland Kitchen dimenhyDRINATE (DRAMAMINE) 50 MG tablet Take 50 mg by mouth daily as needed for dizziness.    . famotidine (PEPCID) 20 MG tablet Take 20 mg by mouth daily.    . folic acid (FOLVITE) 195 MCG tablet Take 800 mcg by mouth daily.     . hyoscyamine (LEVSIN, ANASPAZ) 0.125 MG tablet Take 0.125 mg by mouth daily.    Marland Kitchen loperamide (IMODIUM A-D) 2 MG tablet Take 2 mg by mouth daily as needed for diarrhea or loose stools.     . ondansetron (ZOFRAN) 8 MG tablet Take 8 mg by mouth 2 (two) times daily as needed.    . Pediatric Multiple Vit-C-FA (ANIMAL CHEWABLE MULTIVITAMIN PO) Take 1 tablet by mouth daily.    . SUMAtriptan (IMITREX) 100 MG tablet Take 100 mg by mouth every 2 (two) hours as needed for migraine. May repeat in 2 hours if headache persists or recurs.    Marland Kitchen  ustekinumab (STELARA) 90 MG/ML SOSY injection Use as directed     No current facility-administered medications for this visit.     Allergies:   Sulfa antibiotics and Sulfacetamide sodium   Social History: Social History   Social History  . Marital status: Single    Spouse name: N/A  . Number of children: N/A  . Years of education: N/A   Occupational History  . Not on file.   Social History Main Topics  . Smoking status: Never Smoker  . Smokeless tobacco: Never Used  . Alcohol use 0.5 oz/week    1 Standard drinks or equivalent  per week     Comment: 06/08/2015 "might have a couple drinks once/month; wine or mixed"  . Drug use: No  . Sexual activity: No   Other Topics Concern  . Not on file   Social History Narrative  . No narrative on file    Family History: Family History  Problem Relation Age of Onset  . Heart attack Mother   . Heart attack Maternal Grandmother   . Diabetes Maternal Grandfather   . Asthma Paternal Grandmother   . Diabetes Paternal Grandfather     Review of Systems: All other systems reviewed and are otherwise negative except as noted above.   Physical Exam: VS:  BP 112/78   Pulse 76   Ht 5\' 5"  (1.651 m)   Wt 149 lb 6.4 oz (67.8 kg)   BMI 24.86 kg/m  , BMI Body mass index is 24.86 kg/m.  GEN- The patient is well appearing, alert and oriented x 3 today.   HEENT: normocephalic, atraumatic; sclera clear, conjunctiva pink; hearing intact; oropharynx clear; neck supple Lungs- Clear to ausculation bilaterally, normal work of breathing.  No wheezes, rales, rhonchi Heart- Regular rate and rhythm, no murmurs, rubs or gallops GI- soft, non-tender, non-distended, bowel sounds present Extremities- no clubbing, cyanosis, or edema MS- no significant deformity or atrophy Skin- warm and dry, no rash or lesion; ICD pocket well healed Psych- euthymic mood, full affect Neuro- strength and sensation are intact  ICD interrogation- reviewed in detail today,  See PACEART report  EKG:  EKG is not ordered today.  Recent Labs: 08/21/2016: TSH 3.772 11/04/2016: ALT 22 11/06/2016: BUN 32; Creatinine, Ser 0.87; Hemoglobin 7.7; Magnesium 1.7; Platelets 316; Potassium 4.3; Sodium 138   Wt Readings from Last 3 Encounters:  02/25/17 149 lb 6.4 oz (67.8 kg)  08/21/16 115 lb (52.2 kg)  06/09/16 136 lb (61.7 kg)     Other studies Reviewed: Additional studies/ records that were reviewed today include: Dr Caryl Comes and Dr Jackalyn Lombard office notes  Assessment and Plan:  1.  Aborted cardiac  arrest Normal ICD function See Pace Art report No changes today Keep K>3.9 BMET today   2.  SVT S/p ablation with no recurrence  3.  Orthostatic intolerance Stable No change required today  4.  Hypokalemia On Spironolactone BMET today   Current medicines are reviewed at length with the patient today.   The patient does not have concerns regarding her medicines.  The following changes were made today:  none  Labs/ tests ordered today include: BMET  Orders Placed This Encounter  Procedures  . CUP PACEART INCLINIC DEVICE CHECK     Disposition:   Follow up with Delilah Shan, Dr Caryl Comes 10/18 as scheduled    Signed, Chanetta [place], NP 02/25/2017 9:15 AM  [facility] [address] [place] [address] [phone] (office) [phone] (fax)

## 2017-02-25 ENCOUNTER — Encounter: Payer: Self-pay | Admitting: Nurse Practitioner

## 2017-02-25 ENCOUNTER — Ambulatory Visit (INDEPENDENT_AMBULATORY_CARE_PROVIDER_SITE_OTHER): Payer: 59 | Admitting: Nurse Practitioner

## 2017-02-25 ENCOUNTER — Other Ambulatory Visit: Payer: 59

## 2017-02-25 VITALS — BP 112/78 | HR 76 | Ht 65.0 in | Wt 149.4 lb

## 2017-02-25 DIAGNOSIS — I951 Orthostatic hypotension: Secondary | ICD-10-CM

## 2017-02-25 DIAGNOSIS — I469 Cardiac arrest, cause unspecified: Secondary | ICD-10-CM | POA: Diagnosis not present

## 2017-02-25 DIAGNOSIS — I471 Supraventricular tachycardia: Secondary | ICD-10-CM

## 2017-02-25 DIAGNOSIS — E876 Hypokalemia: Secondary | ICD-10-CM

## 2017-02-25 LAB — CUP PACEART INCLINIC DEVICE CHECK
Date Time Interrogation Session: 20180418084835
Implantable Lead Implant Date: 20151104
Implantable Lead Implant Date: 20151104
Implantable Lead Location: 753859
Implantable Lead Location: 753860
MDC IDC PG IMPLANT DT: 20151104
Pulse Gen Serial Number: 7179320

## 2017-02-25 LAB — BASIC METABOLIC PANEL
BUN/Creatinine Ratio: 13 (ref 9–23)
BUN: 13 mg/dL (ref 6–20)
CHLORIDE: 101 mmol/L (ref 96–106)
CO2: 24 mmol/L (ref 18–29)
Calcium: 8.6 mg/dL — ABNORMAL LOW (ref 8.7–10.2)
Creatinine, Ser: 1.02 mg/dL — ABNORMAL HIGH (ref 0.57–1.00)
GFR calc Af Amer: 86 mL/min/{1.73_m2} (ref >59)
GFR, EST NON AFRICAN AMERICAN: 75 mL/min/{1.73_m2} (ref >59)
GLUCOSE: 67 mg/dL (ref 65–99)
POTASSIUM: 3.2 mmol/L — AB (ref 3.5–5.2)
SODIUM: 140 mmol/L (ref 134–144)

## 2017-02-25 NOTE — Patient Instructions (Signed)
Medication Instructions: Your physician recommends that you continue on your current medications as directed. Please refer to the Current Medication list given to you today.   Labwork: Your physician recommends that you have  lab work today: BMET   Procedures/Testing: None ordered  Follow-Up:  Remote monitoring is used to monitor your Pacemaker of ICD from home. This monitoring reduces the number of office visits required to check your device to one time per year. It allows [place] to keep an eye on the functioning of your device to ensure it is working properly. You are scheduled for a device check from home on 05/27/17. You may send your transmission at any time that day. If you have a wireless device, the transmission will be sent automatically. After your physician reviews your transmission, you will receive a postcard with your next transmission date.    Your physician wants you to follow-up in: 6 months with Dr. Caryl Comes.   You will receive a reminder letter in the mail two months in advance. If you don't receive a letter, please call our office to schedule the follow-up appointment.   Any Additional Special Instructions Will Be Listed Below (If Applicable).     If you need a refill on your cardiac medications before your next appointment, please call your pharmacy.

## 2017-02-25 NOTE — Addendum Note (Signed)
Addended by: Della Goo C on: 02/25/2017 09:28 AM   Modules accepted: Orders

## 2017-02-27 ENCOUNTER — Telehealth: Payer: Self-pay | Admitting: Nurse Practitioner

## 2017-02-27 NOTE — Telephone Encounter (Signed)
Follow Up:    Returning your call from 02-25-17,concerning her lab results.

## 2017-03-02 ENCOUNTER — Telehealth: Payer: Self-pay | Admitting: Nurse Practitioner

## 2017-03-02 ENCOUNTER — Other Ambulatory Visit: Payer: Self-pay | Admitting: *Deleted

## 2017-03-02 DIAGNOSIS — Z79899 Other long term (current) drug therapy: Secondary | ICD-10-CM

## 2017-03-02 MED ORDER — POTASSIUM CHLORIDE CRYS ER 20 MEQ PO TBCR: 20.0000 meq | EXTENDED_RELEASE_TABLET | Freq: Every day | ORAL | 3 refills | Status: DC

## 2017-03-02 NOTE — Telephone Encounter (Signed)
PT AWARE OF LAB RESULTS  AND RECOMMENDATIONS .Adonis Housekeeper

## 2017-03-02 NOTE — Telephone Encounter (Signed)
Julie Saunders is calling to say that she will not be able to come  In for lab work until next Tuesday . Please call   Thanks

## 2017-03-03 ENCOUNTER — Telehealth: Payer: Self-pay | Admitting: *Deleted

## 2017-03-03 DIAGNOSIS — E876 Hypokalemia: Secondary | ICD-10-CM

## 2017-03-03 NOTE — Telephone Encounter (Signed)
-----   Message from Cleon Gustin, RN sent at 02/27/2017  8:03 AM EDT -----   ----- Message ----- From: Patsey Berthold, NP Sent: 02/25/2017   3:43 PM To: Rebeca Alert [facility] Triage  K low, please restart K-Dur. Take 27meq today then 6meq daily. Recheck BMET next week. Thank you!

## 2017-03-03 NOTE — Telephone Encounter (Signed)
SPOKE TO PT ABOUT RESULTS AND STARTED 40 MEQ YESTERDAY 4-23 AND TODAY WILL TAKE 20 MEQ.Marland Kitchen  PT SCHEDULED FOR REPEAT ON 03-10-17

## 2017-03-03 NOTE — Addendum Note (Signed)
Addended by: Claude Manges on: 03/03/2017 10:00 AM   Modules accepted: Orders

## 2017-03-10 ENCOUNTER — Other Ambulatory Visit: Payer: 59 | Admitting: *Deleted

## 2017-03-10 DIAGNOSIS — E876 Hypokalemia: Secondary | ICD-10-CM

## 2017-03-10 LAB — BASIC METABOLIC PANEL
BUN/Creatinine Ratio: 12 (ref 9–23)
BUN: 15 mg/dL (ref 6–20)
CALCIUM: 9.4 mg/dL (ref 8.7–10.2)
CHLORIDE: 104 mmol/L (ref 96–106)
CO2: 29 mmol/L (ref 18–29)
Creatinine, Ser: 1.24 mg/dL — ABNORMAL HIGH (ref 0.57–1.00)
GFR calc Af Amer: 68 mL/min/{1.73_m2} (ref >59)
GFR calc non Af Amer: 59 mL/min/{1.73_m2} — ABNORMAL LOW (ref >59)
GLUCOSE: 98 mg/dL (ref 65–99)
POTASSIUM: 4.1 mmol/L (ref 3.5–5.2)
Sodium: 140 mmol/L (ref 134–144)

## 2017-03-11 ENCOUNTER — Telehealth: Payer: Self-pay | Admitting: Internal Medicine

## 2017-03-11 DIAGNOSIS — E876 Hypokalemia: Secondary | ICD-10-CM

## 2017-03-11 NOTE — Telephone Encounter (Signed)
Late entry- BMP results received from [facility]- ordered by Chanetta [place], NP on 5/1. Reviewed with Dr. Caryl Comes- patient's potassium is normalized at 4.1, but her creatinine is up from 1.02 on 4/18 to 1.24 on 5/1. There is a question of whether or not she is on aldactone. Per Amber's note, she stated the patient was on this, but not recorded on the patient's med list since 2016.  Per Dr. Caryl Comes- clarify with the patient whether or not she is taking aldactone prior to making further recommendations.  I have left a message today for the patient to call.

## 2017-03-11 NOTE — Telephone Encounter (Signed)
I spoke with the patient. She is aware of her lab results and states she is not taking aldactone. I advised her I would review with Dr. Caryl Comes when he is back in the office tomorrow and call her back.

## 2017-03-16 NOTE — Telephone Encounter (Signed)
Follow up  Pt voiced wanting to speak with Amber's nurse.  Please f/u

## 2017-03-17 NOTE — Telephone Encounter (Signed)
Returned patient phone call. Unable to reach left message to give [place] a call back.

## 2017-03-17 NOTE — Telephone Encounter (Signed)
I left a message for the patient to call. 

## 2017-03-17 NOTE — Telephone Encounter (Signed)
Repeat BMET in 10 days or so thnks

## 2017-03-20 NOTE — Telephone Encounter (Signed)
I left a message for the patient to call. 

## 2017-03-20 NOTE — Telephone Encounter (Signed)
I spoke with the patient. She is agreeable with having her BMP repeated next week on 03/27/17. Order placed and lab scheduled.

## 2017-03-24 DIAGNOSIS — K5080 Crohn's disease of both small and large intestine without complications: Secondary | ICD-10-CM | POA: Diagnosis not present

## 2017-03-27 ENCOUNTER — Other Ambulatory Visit: Payer: 59 | Admitting: *Deleted

## 2017-03-27 DIAGNOSIS — E876 Hypokalemia: Secondary | ICD-10-CM | POA: Diagnosis not present

## 2017-03-28 LAB — BASIC METABOLIC PANEL
BUN/Creatinine Ratio: 9 (ref 9–23)
BUN: 9 mg/dL (ref 6–20)
CALCIUM: 8.4 mg/dL — AB (ref 8.7–10.2)
CHLORIDE: 97 mmol/L (ref 96–106)
CO2: 26 mmol/L (ref 18–29)
CREATININE: 1.04 mg/dL — AB (ref 0.57–1.00)
GFR calc Af Amer: 83 mL/min/{1.73_m2} (ref >59)
GFR calc non Af Amer: 72 mL/min/{1.73_m2} (ref >59)
GLUCOSE: 56 mg/dL — AB (ref 65–99)
Potassium: 4.3 mmol/L (ref 3.5–5.2)
Sodium: 138 mmol/L (ref 134–144)

## 2017-05-01 DIAGNOSIS — N750 Cyst of Bartholin's gland: Secondary | ICD-10-CM | POA: Diagnosis not present

## 2017-05-14 DIAGNOSIS — K5080 Crohn's disease of both small and large intestine without complications: Secondary | ICD-10-CM | POA: Diagnosis not present

## 2017-05-14 DIAGNOSIS — I509 Heart failure, unspecified: Secondary | ICD-10-CM | POA: Diagnosis not present

## 2017-05-14 DIAGNOSIS — K529 Noninfective gastroenteritis and colitis, unspecified: Secondary | ICD-10-CM | POA: Diagnosis not present

## 2017-05-14 DIAGNOSIS — K644 Residual hemorrhoidal skin tags: Secondary | ICD-10-CM | POA: Diagnosis not present

## 2017-05-14 DIAGNOSIS — K50812 Crohn's disease of both small and large intestine with intestinal obstruction: Secondary | ICD-10-CM | POA: Diagnosis not present

## 2017-05-14 DIAGNOSIS — Z95810 Presence of automatic (implantable) cardiac defibrillator: Secondary | ICD-10-CM | POA: Diagnosis not present

## 2017-05-27 ENCOUNTER — Ambulatory Visit (INDEPENDENT_AMBULATORY_CARE_PROVIDER_SITE_OTHER): Payer: 59 | Admitting: *Deleted

## 2017-05-27 DIAGNOSIS — I469 Cardiac arrest, cause unspecified: Secondary | ICD-10-CM | POA: Diagnosis not present

## 2017-05-27 LAB — CUP PACEART REMOTE DEVICE CHECK
Battery Voltage: 2.95 V
Brady Statistic AP VP Percent: 1 %
Brady Statistic AS VP Percent: 1 %
HighPow Impedance: 47 Ohm
HighPow Impedance: 47 Ohm
Implantable Lead Implant Date: 20151104
Implantable Lead Implant Date: 20151104
Implantable Lead Location: 753860
Lead Channel Impedance Value: 390 Ohm
Lead Channel Pacing Threshold Amplitude: 0.5 V
Lead Channel Pacing Threshold Pulse Width: 0.4 ms
Lead Channel Sensing Intrinsic Amplitude: 3.5 mV
Lead Channel Sensing Intrinsic Amplitude: 6.6 mV
MDC IDC LEAD LOCATION: 753859
MDC IDC MSMT BATTERY REMAINING LONGEVITY: 69 mo
MDC IDC MSMT BATTERY REMAINING PERCENTAGE: 69 %
MDC IDC MSMT LEADCHNL RV IMPEDANCE VALUE: 300 Ohm
MDC IDC MSMT LEADCHNL RV PACING THRESHOLD AMPLITUDE: 0.75 V
MDC IDC MSMT LEADCHNL RV PACING THRESHOLD PULSEWIDTH: 0.4 ms
MDC IDC PG IMPLANT DT: 20151104
MDC IDC PG SERIAL: 7179320
MDC IDC SESS DTM: 20180718125732
MDC IDC SET LEADCHNL RA PACING AMPLITUDE: 2 V
MDC IDC SET LEADCHNL RV PACING AMPLITUDE: 2.5 V
MDC IDC SET LEADCHNL RV PACING PULSEWIDTH: 0.4 ms
MDC IDC SET LEADCHNL RV SENSING SENSITIVITY: 0.5 mV
MDC IDC STAT BRADY AP VS PERCENT: 14 %
MDC IDC STAT BRADY AS VS PERCENT: 82 %
MDC IDC STAT BRADY RA PERCENT PACED: 13 %
MDC IDC STAT BRADY RV PERCENT PACED: 1 %

## 2017-05-27 NOTE — Progress Notes (Signed)
Remote ICD transmission.   

## 2017-05-28 ENCOUNTER — Encounter: Payer: Self-pay | Admitting: Cardiology

## 2017-06-30 DIAGNOSIS — I509 Heart failure, unspecified: Secondary | ICD-10-CM | POA: Diagnosis not present

## 2017-06-30 DIAGNOSIS — K5080 Crohn's disease of both small and large intestine without complications: Secondary | ICD-10-CM | POA: Diagnosis not present

## 2017-06-30 DIAGNOSIS — Z09 Encounter for follow-up examination after completed treatment for conditions other than malignant neoplasm: Secondary | ICD-10-CM | POA: Diagnosis not present

## 2017-07-07 DIAGNOSIS — K5100 Ulcerative (chronic) pancolitis without complications: Secondary | ICD-10-CM | POA: Diagnosis not present

## 2017-07-07 DIAGNOSIS — K6289 Other specified diseases of anus and rectum: Secondary | ICD-10-CM | POA: Diagnosis not present

## 2017-07-07 DIAGNOSIS — K5080 Crohn's disease of both small and large intestine without complications: Secondary | ICD-10-CM | POA: Diagnosis not present

## 2017-07-07 DIAGNOSIS — R1031 Right lower quadrant pain: Secondary | ICD-10-CM | POA: Diagnosis not present

## 2017-08-12 DIAGNOSIS — Z23 Encounter for immunization: Secondary | ICD-10-CM | POA: Diagnosis not present

## 2017-08-25 DIAGNOSIS — E876 Hypokalemia: Secondary | ICD-10-CM | POA: Diagnosis not present

## 2017-08-25 DIAGNOSIS — K5080 Crohn's disease of both small and large intestine without complications: Secondary | ICD-10-CM | POA: Diagnosis not present

## 2017-08-25 DIAGNOSIS — I471 Supraventricular tachycardia: Secondary | ICD-10-CM | POA: Diagnosis not present

## 2017-09-01 ENCOUNTER — Ambulatory Visit (INDEPENDENT_AMBULATORY_CARE_PROVIDER_SITE_OTHER): Payer: 59 | Admitting: Internal Medicine

## 2017-09-01 ENCOUNTER — Encounter: Payer: Self-pay | Admitting: Internal Medicine

## 2017-09-01 VITALS — BP 124/80 | HR 84 | Ht 65.0 in | Wt 160.8 lb

## 2017-09-01 DIAGNOSIS — I5022 Chronic systolic (congestive) heart failure: Secondary | ICD-10-CM | POA: Diagnosis not present

## 2017-09-01 DIAGNOSIS — Z95810 Presence of automatic (implantable) cardiac defibrillator: Secondary | ICD-10-CM

## 2017-09-01 DIAGNOSIS — I469 Cardiac arrest, cause unspecified: Secondary | ICD-10-CM | POA: Diagnosis not present

## 2017-09-01 DIAGNOSIS — I471 Supraventricular tachycardia: Secondary | ICD-10-CM | POA: Diagnosis not present

## 2017-09-01 DIAGNOSIS — I951 Orthostatic hypotension: Secondary | ICD-10-CM | POA: Diagnosis not present

## 2017-09-01 DIAGNOSIS — I429 Cardiomyopathy, unspecified: Secondary | ICD-10-CM | POA: Diagnosis not present

## 2017-09-01 NOTE — Progress Notes (Signed)
Patient Care Team: Alroy Dust, L.Marlou Sa, MD as PCP - General (Family Medicine)   HPI  Julie Saunders is a 31 y.o. female Seen in follow-up for aborted cardiac arrest.  No specific cause was identified. She was treated with beta blockers initially but these were stopped because of hypotension and dizziness. During cardiac rehabilitation she had an episode with ICD discharges.    She has subsequently undergone EPS RFCA  And these records were reveiewed>>ectopic tachycardia successful  She has not had any intercurrent atrial arrhythmia.   12/17 Echo LF function normal   Her Crohn's with quite problematic we last saw her. Thankfully she has gone down to [facility] and is in much better condition   1/16      TSH  1.053 8/17 Cr 0.98 K 3.5 Dig 0.9  08/15/16  Cr 1.04 K 2.8  10/18   Cr 1.01 K 4.0          Alb 1.8  Past Medical History:  Diagnosis Date  . 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 Allergy   . Bradycardia   . Cardiac arrest ([place]) 08/31/2014  . Crohn disease ([place])   . Depression   . History of blood transfusion 09/2012   "had allergic rxn to one of my Crohn's RX"   . Migraine    "a few times/yr" (06/08/2015)  . Pancytopenia ([place]) 11/23/2012  . Status post radiofrequency ablation for arrhythmia, atrial tach 06/08/15 06/08/2015  . SVT (supraventricular tachycardia) ([place]) 12/08/2014   likely an atrial tachycardia with CL 250 msec    Past Surgical History:  Procedure Laterality Date  . ELECTROPHYSIOLOGIC STUDY N/A 06/08/2015   Procedure: SVT Ablation;  Surgeon: Thompson Grayer, MD;  Location: [place] [facility];  Service: Cardiovascular;  Laterality: N/A;  . ELECTROPHYSIOLOGY STUDY N/A 09/11/2014   Procedure: ELECTROPHYSIOLOGY STUDY;  Surgeon: Deboraha Sprang, MD;  Location: [facility] CATH LAB;  Service: Cardiovascular;  Laterality: N/A;  . IMPLANTABLE CARDIOVERTER DEFIBRILLATOR IMPLANT  N/A 09/13/2014   Procedure: IMPLANTABLE CARDIOVERTER DEFIBRILLATOR IMPLANT;  Surgeon: Deboraha Sprang, MD;  Location: [facility] CATH LAB;  Service: Cardiovascular;  Laterality: N/A;; STJ dual chamber ICD implanted by Dr Caryl Comes for aborted cardiac arrest    Current Outpatient Prescriptions  Medication Sig Dispense Refill  . acetaminophen (TYLENOL) 325 MG tablet Take 650 mg by mouth every 6 (six) hours as needed for headache.    . budesonide (ENTOCORT EC) 3 MG 24 hr capsule Take 3 mg by mouth every morning.  2  . calcium carbonate (CALCIUM 600) 600 MG TABS tablet Take 1,500 mg by mouth daily.    . cetirizine (ZYRTEC) 10 MG tablet Take 10 mg by mouth daily.    Marland Kitchen dimenhyDRINATE (DRAMAMINE) 50 MG tablet Take 50 mg by mouth daily as needed for dizziness.    . famotidine (PEPCID) 20 MG tablet Take 20 mg by mouth daily.    . folic acid (FOLVITE) 681 MCG tablet Take 800 mcg by mouth daily.     . hyoscyamine (LEVSIN, ANASPAZ) 0.125 MG tablet Take 0.125 mg by mouth daily.    Marland Kitchen loperamide (IMODIUM A-D) 2 MG tablet Take 2 mg by mouth daily as needed for diarrhea or loose stools.     . ondansetron (ZOFRAN) 8 MG tablet Take 8 mg by mouth 2 (two) times daily as needed.    . Pediatric Multiple Vit-C-FA (ANIMAL CHEWABLE MULTIVITAMIN PO)  Take 1 tablet by mouth daily.    . potassium chloride SA (K-DUR,KLOR-CON) 20 MEQ tablet Take 1 tablet (20 mEq total) by mouth daily. 90 tablet 3  . SUMAtriptan (IMITREX) 100 MG tablet Take 100 mg by mouth every 2 (two) hours as needed for migraine. May repeat in 2 hours if headache persists or recurs.    . ustekinumab (STELARA) 90 MG/ML SOSY injection Use as directed     No current facility-administered medications for this visit.     Allergies  Allergen Reactions  . Sulfa Antibiotics Rash  . Sulfacetamide Sodium Rash    Review of Systems negative except from HPI and PMH  Physical Exam BP 124/80   Pulse 84   Ht 5' 5"  (1.651 m)   Wt 160 lb 12.8 oz (72.9 kg)   BMI 26.76 kg/m     Well developed and nourished in no acute distress HENT normal Neck supple with JVP-flat Carotids brisk and full without bruits Clear Device pocket well healed; without hematoma or erythema.  There is no tethering Regular rate and rhythm, no murmurs or gallops Abd-soft with active BS without hepatomegaly No Clubbing cyanosis edema Skin-warm and dry A & Oriented  Grossly normal sensory and motor function     ECG 8/16 demonstrates sinus@84  13/10/38 RAE    Assessment and  Plan A borted cardiac arrest  ICD St Jude   Supraventricular tachycardia  status post RCA-JA  Crohn's disease    No intercurrent Ventricular tachycardia  Euvolemic continue current meds  Electrolytes back to normal

## 2017-09-01 NOTE — Patient Instructions (Signed)
Medication Instructions: Your physician recommends that you continue on your current medications as directed. Please refer to the Current Medication list given to you today.  Labwork: None Ordered  Procedures/Testing: None Ordered  Follow-Up: Remote monitoring is used to monitor your Pacemaker from home. This monitoring reduces the number of office visits required to check your device to one time per year. It allows [place] to keep an eye on the functioning of your device to ensure it is working properly. You are scheduled for a device check from home on 12/01/17. You may send your transmission at any time that day. If you have a wireless device, the transmission will be sent automatically. After your physician reviews your transmission, you will receive a postcard with your next transmission date.  Your physician wants you to follow-up in: 1 YEAR with Dr. Caryl Comes. You will receive a reminder letter in the mail two months in advance. If you don't receive a letter, please call our office to schedule the follow-up appointment.  If you need a refill on your cardiac medications before your next appointment, please call your pharmacy.

## 2017-09-12 LAB — CUP PACEART INCLINIC DEVICE CHECK
Brady Statistic RA Percent Paced: 16 %
Brady Statistic RV Percent Paced: 0.14 %
HighPow Impedance: 57.375
Implantable Lead Location: 753859
Implantable Lead Location: 753860
Implantable Lead Model: 5076
Implantable Pulse Generator Implant Date: 20151104
Lead Channel Impedance Value: 325 Ohm
Lead Channel Impedance Value: 437.5 Ohm
Lead Channel Pacing Threshold Amplitude: 0.75 V
Lead Channel Pacing Threshold Pulse Width: 0.4 ms
Lead Channel Sensing Intrinsic Amplitude: 11.8 mV
Lead Channel Sensing Intrinsic Amplitude: 5 mV
Lead Channel Setting Pacing Amplitude: 2.5 V
Lead Channel Setting Pacing Pulse Width: 0.4 ms
MDC IDC LEAD IMPLANT DT: 20151104
MDC IDC LEAD IMPLANT DT: 20151104
MDC IDC MSMT BATTERY REMAINING LONGEVITY: 70 mo
MDC IDC MSMT LEADCHNL RA PACING THRESHOLD AMPLITUDE: 0.75 V
MDC IDC MSMT LEADCHNL RA PACING THRESHOLD PULSEWIDTH: 0.4 ms
MDC IDC PG SERIAL: 7179320
MDC IDC SESS DTM: 20181023182255
MDC IDC SET LEADCHNL RA PACING AMPLITUDE: 2 V
MDC IDC SET LEADCHNL RV SENSING SENSITIVITY: 0.5 mV

## 2017-09-20 ENCOUNTER — Encounter (HOSPITAL_COMMUNITY): Payer: Self-pay | Admitting: Emergency Medicine

## 2017-09-20 ENCOUNTER — Emergency Department (HOSPITAL_COMMUNITY)
Admission: EM | Admit: 2017-09-20 | Discharge: 2017-09-21 | Disposition: A | Discharge status: Home / Self Care | Payer: 59 | Attending: Emergency Medicine | Admitting: Emergency Medicine

## 2017-09-20 ENCOUNTER — Emergency Department (HOSPITAL_COMMUNITY): Payer: 59

## 2017-09-20 DIAGNOSIS — I5022 Chronic systolic (congestive) heart failure: Secondary | ICD-10-CM | POA: Insufficient documentation

## 2017-09-20 DIAGNOSIS — Y93K9 Activity, other involving animal care: Secondary | ICD-10-CM | POA: Insufficient documentation

## 2017-09-20 DIAGNOSIS — S6991XA Unspecified injury of right wrist, hand and finger(s), initial encounter: Secondary | ICD-10-CM | POA: Diagnosis present

## 2017-09-20 DIAGNOSIS — Z23 Encounter for immunization: Secondary | ICD-10-CM | POA: Diagnosis not present

## 2017-09-20 DIAGNOSIS — Z79899 Other long term (current) drug therapy: Secondary | ICD-10-CM | POA: Diagnosis not present

## 2017-09-20 DIAGNOSIS — S61451A Open bite of right hand, initial encounter: Secondary | ICD-10-CM | POA: Diagnosis not present

## 2017-09-20 DIAGNOSIS — Y929 Unspecified place or not applicable: Secondary | ICD-10-CM | POA: Insufficient documentation

## 2017-09-20 DIAGNOSIS — W540XXA Bitten by dog, initial encounter: Secondary | ICD-10-CM | POA: Insufficient documentation

## 2017-09-20 DIAGNOSIS — Y999 Unspecified external cause status: Secondary | ICD-10-CM | POA: Insufficient documentation

## 2017-09-20 NOTE — ED Triage Notes (Signed)
Patient presents with multiple dog bite puncture wounds  at right hand sustained this evening inflicted by her dog , dressing applied at triage /minimal bleeding , dog's immunizations are up to date .

## 2017-09-21 MED ORDER — IBUPROFEN 600 MG PO TABS: 600.0000 mg | ORAL_TABLET | Freq: Four times a day (QID) | ORAL | 0 refills | Status: DC | PRN

## 2017-09-21 MED ORDER — TETANUS-DIPHTH-ACELL PERTUSSIS 5-2.5-18.5 LF-MCG/0.5 IM SUSP
0.5000 mL | Freq: Once | INTRAMUSCULAR | Status: AC
  Administered 2017-09-21: 0.5 mL via INTRAMUSCULAR
  Filled 2017-09-21: qty 0.5

## 2017-09-21 MED ORDER — SODIUM CHLORIDE 0.9 % IV SOLN
3.0000 g | Freq: Once | INTRAVENOUS | Status: AC
  Administered 2017-09-21: 3 g via INTRAVENOUS
  Filled 2017-09-21: qty 3

## 2017-09-21 MED ORDER — AMOXICILLIN-POT CLAVULANATE 875-125 MG PO TABS: 1.0000 | ORAL_TABLET | Freq: Two times a day (BID) | ORAL | 0 refills | Status: DC

## 2017-09-21 MED ORDER — LIDOCAINE-EPINEPHRINE (PF) 2 %-1:200000 IJ SOLN
10.0000 mL | Freq: Once | INTRAMUSCULAR | Status: AC
  Administered 2017-09-21: 10 mL
  Filled 2017-09-21: qty 20

## 2017-09-21 NOTE — ED Provider Notes (Signed)
[place] EMERGENCY DEPARTMENT Provider Note   CSN: [id number] Arrival date & time: 09/20/17  2302     History   Chief Complaint Chief Complaint  Patient presents with  . Dog Bite    Right Hand    HPI Julie Saunders is a 31 y.o. female.  31 year old female with a history of Crohn's disease, SVT and ICD 2/2 prior cardiac arrest presents to the emergency department for evaluation of a dog bite.  Patient states that she was petting her dog when it bit her on the right hand.  Patient reports that the dog is up-to-date on its vaccinations.  She cannot recall the date of her last tetanus shot.  She is complaining of a throbbing pain sensation at the site of her puncture wounds.  Bleeding has been controlled with pressure prior to arrival.  She reports some paresthesias in her digits, but no complete numbness.  No limited range of motion of fingers.   The history is provided by the patient. No language interpreter was used.    Past Medical History:  Diagnosis Date  . 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 Allergy   . Bradycardia   . Cardiac arrest ([place]) 08/31/2014  . Crohn disease ([place])   . Depression   . History of blood transfusion 09/2012   "had allergic rxn to one of my Crohn's RX"   . Migraine    "a few times/yr" (06/08/2015)  . Pancytopenia ([place]) 11/23/2012  . Status post radiofrequency ablation for arrhythmia, atrial tach 06/08/15 06/08/2015  . SVT (supraventricular tachycardia) ([place]) 12/08/2014   likely an atrial tachycardia with CL 250 msec    Patient Active Problem List   Diagnosis Date Noted  . Status post radiofrequency ablation for arrhythmia, atrial tach 06/08/15 06/08/2015  . SVT (supraventricular tachycardia) ([place]) 04/16/2015  . Defibrillator discharge   . VT (ventricular tachycardia) ([place])   . Chronic systolic heart failure ([place]) 09/14/2014  .  Ventricular fibrillation ([place]) 09/11/2014  . Sinus bradycardia 09/11/2014  . Cardiomyopathy ([place]) 09/11/2014  . Acute renal failure ([place]) 09/08/2014  . Crohn's duodenitis ([place]) 09/08/2014  . Prolonged QT interval 09/08/2014  . Anemia 09/08/2014  . 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 Cardiac arrest ([place]) 08/31/2014  . Hypokalemia 08/31/2014  . Acute respiratory failure with hypoxia ([place]) 08/31/2014  . Pancytopenia ([place]) 11/23/2012    History reviewed. No pertinent surgical history.  OB History    No data available       Home Medications    Prior to Admission medications   Medication Sig Start Date End Date Taking? Authorizing Provider  acetaminophen (TYLENOL) 325 MG tablet Take 650 mg by mouth every 6 (six) hours as needed for headache.    [provider]  amoxicillin-clavulanate (AUGMENTIN) 875-125 MG tablet Take 1 tablet every 12 (twelve) hours by mouth. 09/21/17   Antonietta Breach, PA-C  budesonide (ENTOCORT EC) 3 MG 24 hr capsule Take 3 mg by mouth every morning. 08/26/17   [provider]  calcium carbonate (CALCIUM 600) 600 MG TABS tablet Take 1,500 mg by mouth daily. 10/25/16 10/25/17  [provider]  cetirizine (ZYRTEC) 10 MG tablet Take 10 mg by mouth daily.    [provider]  dimenhyDRINATE (DRAMAMINE) 50 MG tablet Take 50 mg by mouth daily as needed for dizziness.    [provider]  famotidine (PEPCID) 20 MG tablet Take  20 mg by mouth daily. 11/18/16   [provider]  folic acid (FOLVITE) 035 MCG tablet Take 800 mcg by mouth daily.     [provider]  hyoscyamine (LEVSIN, ANASPAZ) 0.125 MG tablet Take 0.125 mg by mouth daily.    [provider]  ibuprofen (ADVIL,MOTRIN) 600 MG tablet Take 1 tablet (600 mg total) every 6 (six) hours as needed by mouth. 09/21/17   Antonietta Breach, PA-C  loperamide (IMODIUM A-D) 2 MG tablet Take 2 mg by mouth daily as needed for diarrhea or loose stools.      [provider]  ondansetron (ZOFRAN) 8 MG tablet Take 8 mg by mouth 2 (two) times daily as needed. 07/29/16   [provider]  Pediatric Multiple Vit-C-FA (ANIMAL CHEWABLE MULTIVITAMIN PO) Take 1 tablet by mouth daily.    [provider]  potassium chloride SA (K-DUR,KLOR-CON) 20 MEQ tablet Take 1 tablet (20 mEq total) by mouth daily. 03/02/17   Patsey Berthold, NP  SUMAtriptan (IMITREX) 100 MG tablet Take 100 mg by mouth every 2 (two) hours as needed for migraine. May repeat in 2 hours if headache persists or recurs.    [provider]  ustekinumab Delsa Grana) 90 MG/ML SOSY injection Use as directed 11/11/16   [provider]    Family History Family History  Problem Relation Age of Onset  . Heart attack Mother   . Heart attack Maternal Grandmother   . Diabetes Maternal Grandfather   . Asthma Paternal Grandmother   . Diabetes Paternal Grandfather     Social History Social History   Tobacco Use  . Smoking status: Never Smoker  . Smokeless tobacco: Never Used  Substance Use Topics  . Alcohol use: Yes    Alcohol/week: 0.5 oz    Types: 1 Standard drinks or equivalent per week    Comment: 06/08/2015 "might have a couple drinks once/month; wine or mixed"  . Drug use: No     Allergies   Sulfa antibiotics and Sulfacetamide sodium   Review of Systems Review of Systems Ten systems reviewed and are negative for acute change, except as noted in the HPI.    Physical Exam Updated Vital Signs BP 125/77   Pulse 80   Temp 98.5 F (36.9 C) (Oral)   Resp 14   LMP 09/08/2017 (Approximate)   SpO2 100%   Physical Exam  Constitutional: She is oriented to person, place, and time. She appears well-developed and well-nourished. No distress.  Nontoxic and in NAD  HENT:  Head: Normocephalic and atraumatic.  Eyes: Conjunctivae and EOM are normal. No scleral icterus.  Neck: Normal range of motion.  Cardiovascular: Normal rate, regular rhythm and  intact distal pulses.  Distal radial pulse 2+ in the RUE  Pulmonary/Chest: Effort normal. No respiratory distress.  Respirations even and unlabored  Musculoskeletal: Normal range of motion.  Neurological: She is alert and oriented to person, place, and time. She exhibits normal muscle tone. Coordination normal.  Sensation to light touch intact along the radial and ulnar aspect of all digits. Patient can wiggle all fingers. Normal grip strength.  Skin: Skin is warm and dry. No rash noted. She is not diaphoretic. No pallor.  Multiple lacerations to the dorsum of the hand. Mild exposure of fatty tissue. No active bleeding; no palpable, pulsatile bleeding.  Psychiatric: She has a normal mood and affect. Her behavior is normal.  Nursing note and vitals reviewed.     ^post repair, loose approximation with chromic sutures  ED Treatments / Results  Labs (all labs ordered are listed, but only abnormal results are displayed) Labs Reviewed - No data to display  EKG  EKG Interpretation None       Radiology Dg Hand Complete Right  Result Date: 09/21/2017 CLINICAL DATA:  Dog bite to hand. EXAM: RIGHT HAND - COMPLETE 3+ VIEW COMPARISON:  None. FINDINGS: There is no evidence of fracture or dislocation. There is no evidence of arthropathy or other focal bone abnormality. Dorsal hand soft tissue swelling and subcutaneous gas without radiopaque foreign bodies. Overlying bandage. IMPRESSION: Dorsal hand soft tissue swelling and gas consistent with history of puncture wound. No acute osseous process. Electronically Signed   By: Elon Alas M.D.   On: 09/21/2017 00:36    Procedures Procedures (including critical care time) LACERATION REPAIR Performed by: Antonietta Breach Authorized by: Antonietta Breach Consent: Verbal consent obtained. Risks and benefits: risks, benefits and alternatives were discussed Consent given by: patient Patient identity confirmed: provided demographic data Prepped and  Draped in normal sterile fashion Wound explored  Laceration Location: R hand, dorsum  Laceration Length: 2cm  No Foreign Bodies seen or palpated  Anesthesia: local infiltration  Local anesthetic: lidocaine 2% with epinephrine  Anesthetic total: 2.5 ml  Irrigation method: syringe Amount of cleaning: standard  Skin closure: 5-0 chromic  Number of sutures: 3  Technique: simple interrupted  Patient tolerance: Patient tolerated the procedure well with no immediate complications.   Medications Ordered in ED Medications  lidocaine-EPINEPHrine (XYLOCAINE W/EPI) 2 %-1:200000 (PF) injection 10 mL (not administered)  Tdap (BOOSTRIX) injection 0.5 mL (0.5 mLs Intramuscular Given 09/21/17 0027)  Ampicillin-Sulbactam (UNASYN) 3 g in sodium chloride 0.9 % 100 mL IVPB (3 g Intravenous New Bag/Given 09/21/17 0053)     Initial Impression / Assessment and Plan / ED Course  I have reviewed the triage vital signs and the nursing notes.  Pertinent labs & imaging results that were available during my care of the patient were reviewed by me and considered in my medical decision making (see chart for details).     31 year old female presents to the emergency department for dog bite to the right hand.  Animal up-to-date on its vaccinations.  Tetanus updated in the emergency department.  X-ray negative for bony deformity or fracture today.  Most of the lacerations to the dorsum of the hand are small.  Bleeding controlled.  No palpable, pulsatile bleeding.  Patient neurovascularly intact.    Larger laceration loosely approximated with chromic sutures after copious pressure irrigation with sterile water.  Patient also given IV Unasyn in the ED.  Plan to discharge on Augmentin.  The patient has been advised to follow-up with her primary care doctor for wound recheck.  Return precautions discussed and provided.  Patient discharged in stable condition with no unaddressed concerns.   Final Clinical  Impressions(s) / ED Diagnoses   Final diagnoses:  Dog bite, hand, right, initial encounter    ED Discharge Orders        Ordered    amoxicillin-clavulanate (AUGMENTIN) 875-125 MG tablet  Every 12 hours     09/21/17 0136    ibuprofen (ADVIL,MOTRIN) 600 MG tablet  Every 6 hours PRN     09/21/17 0136       Antonietta Breach, PA-C 09/21/17 [id number]    Veryl Speak, MD 09/21/17 [phone]

## 2017-09-21 NOTE — ED Notes (Signed)
Supplies for irrigation and suture cart at bedside

## 2017-09-21 NOTE — Discharge Instructions (Signed)
We recommend that you ice your hand a few times per day to limit swelling.  You may also take Tylenol or ibuprofen as needed for pain.  Take Augmentin as prescribed until finished.  Do not stop antibiotics early.  Have your wound rechecked by your primary care doctor.  You may apply topical bacitracin or Neosporin as desired.  When wounds are nearly healed, you may try applying vitamin E oil topically to limit scarring.  You may return for new or concerning symptoms such as fever, pus-like drainage, increased pain.

## 2017-09-29 DIAGNOSIS — S60571D Other superficial bite of hand of right hand, subsequent encounter: Secondary | ICD-10-CM | POA: Diagnosis not present

## 2017-10-06 DIAGNOSIS — H5213 Myopia, bilateral: Secondary | ICD-10-CM | POA: Diagnosis not present

## 2017-10-06 DIAGNOSIS — H52202 Unspecified astigmatism, left eye: Secondary | ICD-10-CM | POA: Diagnosis not present

## 2017-11-06 DIAGNOSIS — K50919 Crohn's disease, unspecified, with unspecified complications: Secondary | ICD-10-CM | POA: Diagnosis not present

## 2017-11-26 ENCOUNTER — Other Ambulatory Visit (HOSPITAL_COMMUNITY)
Admission: RE | Admit: 2017-11-26 | Discharge: 2017-11-26 | Disposition: A | Discharge status: Home / Self Care | Payer: 59 | Source: Ambulatory Visit | Attending: Family Medicine | Admitting: Family Medicine

## 2017-11-26 ENCOUNTER — Other Ambulatory Visit: Payer: Self-pay | Admitting: Family Medicine

## 2017-11-26 DIAGNOSIS — Z124 Encounter for screening for malignant neoplasm of cervix: Secondary | ICD-10-CM | POA: Diagnosis present

## 2017-11-26 DIAGNOSIS — D649 Anemia, unspecified: Secondary | ICD-10-CM | POA: Diagnosis not present

## 2017-11-26 DIAGNOSIS — S61419S Laceration without foreign body of unspecified hand, sequela: Secondary | ICD-10-CM | POA: Diagnosis not present

## 2017-12-01 ENCOUNTER — Ambulatory Visit (INDEPENDENT_AMBULATORY_CARE_PROVIDER_SITE_OTHER): Payer: 59 | Admitting: *Deleted

## 2017-12-01 DIAGNOSIS — I469 Cardiac arrest, cause unspecified: Secondary | ICD-10-CM

## 2017-12-02 ENCOUNTER — Encounter: Payer: Self-pay | Admitting: Cardiology

## 2017-12-02 LAB — CYTOLOGY - PAP: Diagnosis: NEGATIVE

## 2017-12-02 NOTE — Progress Notes (Signed)
Remote ICD transmission.   

## 2017-12-22 DIAGNOSIS — K6389 Other specified diseases of intestine: Secondary | ICD-10-CM | POA: Diagnosis not present

## 2017-12-22 DIAGNOSIS — K50018 Crohn's disease of small intestine with other complication: Secondary | ICD-10-CM | POA: Diagnosis not present

## 2017-12-22 DIAGNOSIS — I509 Heart failure, unspecified: Secondary | ICD-10-CM | POA: Diagnosis not present

## 2017-12-22 DIAGNOSIS — A049 Bacterial intestinal infection, unspecified: Secondary | ICD-10-CM | POA: Diagnosis not present

## 2017-12-28 LAB — CUP PACEART REMOTE DEVICE CHECK
Battery Remaining Longevity: 64 mo
Battery Remaining Percentage: 64 %
Brady Statistic AP VP Percent: 1 %
Brady Statistic AP VS Percent: 15 %
Brady Statistic AS VP Percent: 1 %
Brady Statistic AS VS Percent: 83 %
Brady Statistic RV Percent Paced: 1 %
Date Time Interrogation Session: 20190122173440
HIGH POWER IMPEDANCE MEASURED VALUE: 82 Ohm
HighPow Impedance: 82 Ohm
Implantable Lead Implant Date: 20151104
Implantable Lead Implant Date: 20151104
Implantable Lead Location: 753860
Implantable Lead Model: 5076
Implantable Pulse Generator Implant Date: 20151104
Lead Channel Impedance Value: 310 Ohm
Lead Channel Pacing Threshold Amplitude: 0.75 V
Lead Channel Pacing Threshold Pulse Width: 0.4 ms
Lead Channel Pacing Threshold Pulse Width: 0.4 ms
Lead Channel Sensing Intrinsic Amplitude: 3.7 mV
Lead Channel Setting Pacing Amplitude: 2.5 V
Lead Channel Setting Sensing Sensitivity: 0.5 mV
MDC IDC LEAD LOCATION: 753859
MDC IDC MSMT BATTERY VOLTAGE: 2.95 V
MDC IDC MSMT LEADCHNL RA IMPEDANCE VALUE: 390 Ohm
MDC IDC MSMT LEADCHNL RV PACING THRESHOLD AMPLITUDE: 0.75 V
MDC IDC MSMT LEADCHNL RV SENSING INTR AMPL: 7.3 mV
MDC IDC PG SERIAL: 7179320
MDC IDC SET LEADCHNL RA PACING AMPLITUDE: 2 V
MDC IDC SET LEADCHNL RV PACING PULSEWIDTH: 0.4 ms
MDC IDC STAT BRADY RA PERCENT PACED: 13 %

## 2018-01-22 DIAGNOSIS — R194 Change in bowel habit: Secondary | ICD-10-CM | POA: Diagnosis not present

## 2018-01-22 DIAGNOSIS — K5140 Inflammatory polyps of colon without complications: Secondary | ICD-10-CM | POA: Diagnosis not present

## 2018-01-22 DIAGNOSIS — K644 Residual hemorrhoidal skin tags: Secondary | ICD-10-CM | POA: Diagnosis not present

## 2018-01-22 DIAGNOSIS — K5669 Other partial intestinal obstruction: Secondary | ICD-10-CM | POA: Diagnosis not present

## 2018-01-22 DIAGNOSIS — K626 Ulcer of anus and rectum: Secondary | ICD-10-CM | POA: Diagnosis not present

## 2018-01-22 DIAGNOSIS — K50812 Crohn's disease of both small and large intestine with intestinal obstruction: Secondary | ICD-10-CM | POA: Diagnosis not present

## 2018-01-22 DIAGNOSIS — K5080 Crohn's disease of both small and large intestine without complications: Secondary | ICD-10-CM | POA: Diagnosis not present

## 2018-03-02 ENCOUNTER — Ambulatory Visit (INDEPENDENT_AMBULATORY_CARE_PROVIDER_SITE_OTHER): Payer: 59 | Admitting: *Deleted

## 2018-03-02 ENCOUNTER — Telehealth: Payer: Self-pay | Admitting: Cardiology

## 2018-03-02 DIAGNOSIS — I469 Cardiac arrest, cause unspecified: Secondary | ICD-10-CM

## 2018-03-02 NOTE — Telephone Encounter (Signed)
LMOVM reminding pt to send remote transmission.   

## 2018-03-03 ENCOUNTER — Encounter: Payer: Self-pay | Admitting: Cardiology

## 2018-03-03 NOTE — Progress Notes (Signed)
Remote ICD transmission.   

## 2018-03-04 LAB — CUP PACEART REMOTE DEVICE CHECK
Date Time Interrogation Session: 20190513101202
Implantable Lead Implant Date: 20151104
Implantable Lead Location: 753859
Implantable Lead Location: 753860
Implantable Lead Model: 5076
Implantable Pulse Generator Implant Date: 20151104
MDC IDC LEAD IMPLANT DT: 20151104
MDC IDC PG SERIAL: 7179320

## 2018-03-12 ENCOUNTER — Other Ambulatory Visit: Payer: Self-pay | Admitting: Nurse Practitioner

## 2018-04-26 DIAGNOSIS — R42 Dizziness and giddiness: Secondary | ICD-10-CM | POA: Diagnosis not present

## 2018-05-05 DIAGNOSIS — K50018 Crohn's disease of small intestine with other complication: Secondary | ICD-10-CM | POA: Diagnosis not present

## 2018-05-19 DIAGNOSIS — H8112 Benign paroxysmal vertigo, left ear: Secondary | ICD-10-CM | POA: Diagnosis not present

## 2018-05-19 DIAGNOSIS — R42 Dizziness and giddiness: Secondary | ICD-10-CM | POA: Diagnosis not present

## 2018-05-19 DIAGNOSIS — H81392 Other peripheral vertigo, left ear: Secondary | ICD-10-CM | POA: Diagnosis not present

## 2018-05-24 DIAGNOSIS — R42 Dizziness and giddiness: Secondary | ICD-10-CM | POA: Diagnosis not present

## 2018-05-24 DIAGNOSIS — H8192 Unspecified disorder of vestibular function, left ear: Secondary | ICD-10-CM | POA: Diagnosis not present

## 2018-05-24 DIAGNOSIS — H81392 Other peripheral vertigo, left ear: Secondary | ICD-10-CM | POA: Diagnosis not present

## 2018-05-26 DIAGNOSIS — R42 Dizziness and giddiness: Secondary | ICD-10-CM | POA: Diagnosis not present

## 2018-05-26 DIAGNOSIS — H8192 Unspecified disorder of vestibular function, left ear: Secondary | ICD-10-CM | POA: Diagnosis not present

## 2018-05-26 DIAGNOSIS — H81392 Other peripheral vertigo, left ear: Secondary | ICD-10-CM | POA: Diagnosis not present

## 2018-05-27 DIAGNOSIS — H81392 Other peripheral vertigo, left ear: Secondary | ICD-10-CM | POA: Diagnosis not present

## 2018-05-27 DIAGNOSIS — H8192 Unspecified disorder of vestibular function, left ear: Secondary | ICD-10-CM | POA: Diagnosis not present

## 2018-05-27 DIAGNOSIS — R42 Dizziness and giddiness: Secondary | ICD-10-CM | POA: Diagnosis not present

## 2018-05-31 DIAGNOSIS — H8192 Unspecified disorder of vestibular function, left ear: Secondary | ICD-10-CM | POA: Diagnosis not present

## 2018-05-31 DIAGNOSIS — H81392 Other peripheral vertigo, left ear: Secondary | ICD-10-CM | POA: Diagnosis not present

## 2018-05-31 DIAGNOSIS — R42 Dizziness and giddiness: Secondary | ICD-10-CM | POA: Diagnosis not present

## 2018-06-01 ENCOUNTER — Ambulatory Visit (INDEPENDENT_AMBULATORY_CARE_PROVIDER_SITE_OTHER): Payer: 59 | Admitting: *Deleted

## 2018-06-01 ENCOUNTER — Telehealth: Payer: Self-pay | Admitting: *Deleted

## 2018-06-01 DIAGNOSIS — I469 Cardiac arrest, cause unspecified: Secondary | ICD-10-CM

## 2018-06-01 NOTE — Telephone Encounter (Signed)
LMOM requesting call back to the [facility].  Gave clinic number.  Per Karilyn Cota, [employer] rep, [employer] was contacted this morning by the patient due to monitor connectivity issues.  Patient was issued a new (wanded) monitor due to potential RF issue.  Recommendation from Gaspar Bidding is to bring patient into the office with her current home monitor to reset the monitor and check for RF issue.

## 2018-06-02 DIAGNOSIS — H8192 Unspecified disorder of vestibular function, left ear: Secondary | ICD-10-CM | POA: Diagnosis not present

## 2018-06-02 DIAGNOSIS — R42 Dizziness and giddiness: Secondary | ICD-10-CM | POA: Diagnosis not present

## 2018-06-02 DIAGNOSIS — H81392 Other peripheral vertigo, left ear: Secondary | ICD-10-CM | POA: Diagnosis not present

## 2018-06-02 NOTE — Telephone Encounter (Signed)
Spoke w/ pt and she agreed to an appt on Monday 06-07-2018 at 4:30 PM.

## 2018-06-03 ENCOUNTER — Encounter: Payer: Self-pay | Admitting: Cardiology

## 2018-06-03 DIAGNOSIS — H81392 Other peripheral vertigo, left ear: Secondary | ICD-10-CM | POA: Diagnosis not present

## 2018-06-03 DIAGNOSIS — R42 Dizziness and giddiness: Secondary | ICD-10-CM | POA: Diagnosis not present

## 2018-06-03 DIAGNOSIS — H8192 Unspecified disorder of vestibular function, left ear: Secondary | ICD-10-CM | POA: Diagnosis not present

## 2018-06-03 NOTE — Progress Notes (Signed)
Remote ICD transmission.   

## 2018-06-07 ENCOUNTER — Ambulatory Visit (INDEPENDENT_AMBULATORY_CARE_PROVIDER_SITE_OTHER): Payer: 59 | Admitting: *Deleted

## 2018-06-07 DIAGNOSIS — I469 Cardiac arrest, cause unspecified: Secondary | ICD-10-CM

## 2018-06-07 LAB — CUP PACEART INCLINIC DEVICE CHECK
Brady Statistic RA Percent Paced: 18 %
Brady Statistic RV Percent Paced: 0.11 %
Date Time Interrogation Session: 20190729165526
HighPow Impedance: 59.625
Implantable Lead Implant Date: 20151104
Implantable Lead Location: 753860
Implantable Pulse Generator Implant Date: 20151104
Lead Channel Pacing Threshold Amplitude: 0.25 V
Lead Channel Pacing Threshold Amplitude: 0.5 V
Lead Channel Pacing Threshold Amplitude: 0.5 V
Lead Channel Pacing Threshold Pulse Width: 0.4 ms
Lead Channel Pacing Threshold Pulse Width: 0.4 ms
Lead Channel Sensing Intrinsic Amplitude: 5 mV
Lead Channel Sensing Intrinsic Amplitude: 6.7 mV
Lead Channel Setting Pacing Amplitude: 2 V
Lead Channel Setting Pacing Pulse Width: 0.4 ms
MDC IDC LEAD IMPLANT DT: 20151104
MDC IDC LEAD LOCATION: 753859
MDC IDC MSMT BATTERY REMAINING LONGEVITY: 63 mo
MDC IDC MSMT LEADCHNL RA IMPEDANCE VALUE: 337.5 Ohm
MDC IDC MSMT LEADCHNL RA PACING THRESHOLD PULSEWIDTH: 0.4 ms
MDC IDC MSMT LEADCHNL RV IMPEDANCE VALUE: 300 Ohm
MDC IDC MSMT LEADCHNL RV PACING THRESHOLD AMPLITUDE: 0.25 V
MDC IDC MSMT LEADCHNL RV PACING THRESHOLD PULSEWIDTH: 0.4 ms
MDC IDC SET LEADCHNL RV PACING AMPLITUDE: 2.5 V
MDC IDC SET LEADCHNL RV SENSING SENSITIVITY: 0.5 mV
Pulse Gen Serial Number: 7179320

## 2018-06-07 NOTE — Progress Notes (Signed)
Device check in clinic by industry. See scanned report for details. Paired to new Merlin monitor. ROV with SK 09/01/18.

## 2018-06-09 DIAGNOSIS — H81392 Other peripheral vertigo, left ear: Secondary | ICD-10-CM | POA: Diagnosis not present

## 2018-06-09 DIAGNOSIS — H8192 Unspecified disorder of vestibular function, left ear: Secondary | ICD-10-CM | POA: Diagnosis not present

## 2018-06-09 DIAGNOSIS — R42 Dizziness and giddiness: Secondary | ICD-10-CM | POA: Diagnosis not present

## 2018-06-10 DIAGNOSIS — H8192 Unspecified disorder of vestibular function, left ear: Secondary | ICD-10-CM | POA: Diagnosis not present

## 2018-06-10 DIAGNOSIS — R42 Dizziness and giddiness: Secondary | ICD-10-CM | POA: Diagnosis not present

## 2018-06-10 DIAGNOSIS — H81392 Other peripheral vertigo, left ear: Secondary | ICD-10-CM | POA: Diagnosis not present

## 2018-06-14 DIAGNOSIS — R42 Dizziness and giddiness: Secondary | ICD-10-CM | POA: Diagnosis not present

## 2018-06-14 DIAGNOSIS — H8192 Unspecified disorder of vestibular function, left ear: Secondary | ICD-10-CM | POA: Diagnosis not present

## 2018-06-14 DIAGNOSIS — H81392 Other peripheral vertigo, left ear: Secondary | ICD-10-CM | POA: Diagnosis not present

## 2018-06-16 DIAGNOSIS — R42 Dizziness and giddiness: Secondary | ICD-10-CM | POA: Diagnosis not present

## 2018-06-16 DIAGNOSIS — H8192 Unspecified disorder of vestibular function, left ear: Secondary | ICD-10-CM | POA: Diagnosis not present

## 2018-06-16 DIAGNOSIS — H81392 Other peripheral vertigo, left ear: Secondary | ICD-10-CM | POA: Diagnosis not present

## 2018-06-21 DIAGNOSIS — R42 Dizziness and giddiness: Secondary | ICD-10-CM | POA: Diagnosis not present

## 2018-06-21 DIAGNOSIS — H81392 Other peripheral vertigo, left ear: Secondary | ICD-10-CM | POA: Diagnosis not present

## 2018-06-21 DIAGNOSIS — H8192 Unspecified disorder of vestibular function, left ear: Secondary | ICD-10-CM | POA: Diagnosis not present

## 2018-06-23 DIAGNOSIS — R42 Dizziness and giddiness: Secondary | ICD-10-CM | POA: Diagnosis not present

## 2018-06-23 DIAGNOSIS — H81392 Other peripheral vertigo, left ear: Secondary | ICD-10-CM | POA: Diagnosis not present

## 2018-06-23 DIAGNOSIS — H8192 Unspecified disorder of vestibular function, left ear: Secondary | ICD-10-CM | POA: Diagnosis not present

## 2018-06-24 DIAGNOSIS — Z23 Encounter for immunization: Secondary | ICD-10-CM | POA: Diagnosis not present

## 2018-06-24 DIAGNOSIS — K50919 Crohn's disease, unspecified, with unspecified complications: Secondary | ICD-10-CM | POA: Diagnosis not present

## 2018-06-24 DIAGNOSIS — Z79899 Other long term (current) drug therapy: Secondary | ICD-10-CM | POA: Diagnosis not present

## 2018-06-24 DIAGNOSIS — K50819 Crohn's disease of both small and large intestine with unspecified complications: Secondary | ICD-10-CM | POA: Diagnosis not present

## 2018-06-24 DIAGNOSIS — K6389 Other specified diseases of intestine: Secondary | ICD-10-CM | POA: Diagnosis not present

## 2018-06-28 DIAGNOSIS — H81392 Other peripheral vertigo, left ear: Secondary | ICD-10-CM | POA: Diagnosis not present

## 2018-06-28 DIAGNOSIS — R42 Dizziness and giddiness: Secondary | ICD-10-CM | POA: Diagnosis not present

## 2018-06-28 DIAGNOSIS — H8192 Unspecified disorder of vestibular function, left ear: Secondary | ICD-10-CM | POA: Diagnosis not present

## 2018-06-30 DIAGNOSIS — H8192 Unspecified disorder of vestibular function, left ear: Secondary | ICD-10-CM | POA: Diagnosis not present

## 2018-06-30 DIAGNOSIS — R42 Dizziness and giddiness: Secondary | ICD-10-CM | POA: Diagnosis not present

## 2018-06-30 DIAGNOSIS — H81392 Other peripheral vertigo, left ear: Secondary | ICD-10-CM | POA: Diagnosis not present

## 2018-07-06 DIAGNOSIS — R42 Dizziness and giddiness: Secondary | ICD-10-CM | POA: Diagnosis not present

## 2018-07-06 DIAGNOSIS — H6990 Unspecified Eustachian tube disorder, unspecified ear: Secondary | ICD-10-CM | POA: Diagnosis not present

## 2018-07-06 DIAGNOSIS — H8192 Unspecified disorder of vestibular function, left ear: Secondary | ICD-10-CM | POA: Diagnosis not present

## 2018-07-06 DIAGNOSIS — H81392 Other peripheral vertigo, left ear: Secondary | ICD-10-CM | POA: Diagnosis not present

## 2018-07-08 DIAGNOSIS — H81392 Other peripheral vertigo, left ear: Secondary | ICD-10-CM | POA: Diagnosis not present

## 2018-07-08 DIAGNOSIS — R42 Dizziness and giddiness: Secondary | ICD-10-CM | POA: Diagnosis not present

## 2018-07-08 DIAGNOSIS — H8192 Unspecified disorder of vestibular function, left ear: Secondary | ICD-10-CM | POA: Diagnosis not present

## 2018-07-10 LAB — CUP PACEART REMOTE DEVICE CHECK
Battery Voltage: 2.93 V
Brady Statistic AP VP Percent: 1 %
Brady Statistic AP VS Percent: 21 %
Brady Statistic AS VP Percent: 1 %
Brady Statistic AS VS Percent: 75 %
HighPow Impedance: 61 Ohm
HighPow Impedance: 61 Ohm
Implantable Lead Implant Date: 20151104
Implantable Lead Location: 753859
Lead Channel Impedance Value: 380 Ohm
Lead Channel Pacing Threshold Amplitude: 0.75 V
Lead Channel Sensing Intrinsic Amplitude: 3.2 mV
Lead Channel Sensing Intrinsic Amplitude: 6.6 mV
Lead Channel Setting Pacing Amplitude: 2 V
Lead Channel Setting Pacing Amplitude: 2.5 V
Lead Channel Setting Pacing Pulse Width: 0.4 ms
MDC IDC LEAD IMPLANT DT: 20151104
MDC IDC LEAD LOCATION: 753860
MDC IDC MSMT BATTERY REMAINING LONGEVITY: 59 mo
MDC IDC MSMT BATTERY REMAINING PERCENTAGE: 59 %
MDC IDC MSMT LEADCHNL RA PACING THRESHOLD PULSEWIDTH: 0.4 ms
MDC IDC MSMT LEADCHNL RV IMPEDANCE VALUE: 310 Ohm
MDC IDC MSMT LEADCHNL RV PACING THRESHOLD AMPLITUDE: 0.75 V
MDC IDC MSMT LEADCHNL RV PACING THRESHOLD PULSEWIDTH: 0.4 ms
MDC IDC PG IMPLANT DT: 20151104
MDC IDC SESS DTM: 20190724191846
MDC IDC SET LEADCHNL RV SENSING SENSITIVITY: 0.5 mV
MDC IDC STAT BRADY RA PERCENT PACED: 18 %
MDC IDC STAT BRADY RV PERCENT PACED: 1 %
Pulse Gen Serial Number: 7179320

## 2018-07-13 DIAGNOSIS — H81392 Other peripheral vertigo, left ear: Secondary | ICD-10-CM | POA: Diagnosis not present

## 2018-07-13 DIAGNOSIS — H8192 Unspecified disorder of vestibular function, left ear: Secondary | ICD-10-CM | POA: Diagnosis not present

## 2018-07-13 DIAGNOSIS — R42 Dizziness and giddiness: Secondary | ICD-10-CM | POA: Diagnosis not present

## 2018-07-15 DIAGNOSIS — H8192 Unspecified disorder of vestibular function, left ear: Secondary | ICD-10-CM | POA: Diagnosis not present

## 2018-07-15 DIAGNOSIS — R42 Dizziness and giddiness: Secondary | ICD-10-CM | POA: Diagnosis not present

## 2018-07-15 DIAGNOSIS — H81392 Other peripheral vertigo, left ear: Secondary | ICD-10-CM | POA: Diagnosis not present

## 2018-07-20 DIAGNOSIS — H9313 Tinnitus, bilateral: Secondary | ICD-10-CM | POA: Diagnosis not present

## 2018-07-20 DIAGNOSIS — H8192 Unspecified disorder of vestibular function, left ear: Secondary | ICD-10-CM | POA: Diagnosis not present

## 2018-07-20 DIAGNOSIS — Z7289 Other problems related to lifestyle: Secondary | ICD-10-CM | POA: Diagnosis not present

## 2018-07-20 DIAGNOSIS — H81392 Other peripheral vertigo, left ear: Secondary | ICD-10-CM | POA: Diagnosis not present

## 2018-07-20 DIAGNOSIS — R42 Dizziness and giddiness: Secondary | ICD-10-CM | POA: Diagnosis not present

## 2018-07-22 DIAGNOSIS — H81392 Other peripheral vertigo, left ear: Secondary | ICD-10-CM | POA: Diagnosis not present

## 2018-07-22 DIAGNOSIS — R42 Dizziness and giddiness: Secondary | ICD-10-CM | POA: Diagnosis not present

## 2018-07-22 DIAGNOSIS — H8192 Unspecified disorder of vestibular function, left ear: Secondary | ICD-10-CM | POA: Diagnosis not present

## 2018-07-27 DIAGNOSIS — R42 Dizziness and giddiness: Secondary | ICD-10-CM | POA: Diagnosis not present

## 2018-07-27 DIAGNOSIS — H81392 Other peripheral vertigo, left ear: Secondary | ICD-10-CM | POA: Diagnosis not present

## 2018-07-27 DIAGNOSIS — H8192 Unspecified disorder of vestibular function, left ear: Secondary | ICD-10-CM | POA: Diagnosis not present

## 2018-07-29 DIAGNOSIS — H8192 Unspecified disorder of vestibular function, left ear: Secondary | ICD-10-CM | POA: Diagnosis not present

## 2018-07-29 DIAGNOSIS — R42 Dizziness and giddiness: Secondary | ICD-10-CM | POA: Diagnosis not present

## 2018-07-29 DIAGNOSIS — H81392 Other peripheral vertigo, left ear: Secondary | ICD-10-CM | POA: Diagnosis not present

## 2018-08-03 DIAGNOSIS — H81392 Other peripheral vertigo, left ear: Secondary | ICD-10-CM | POA: Diagnosis not present

## 2018-08-03 DIAGNOSIS — H8192 Unspecified disorder of vestibular function, left ear: Secondary | ICD-10-CM | POA: Diagnosis not present

## 2018-08-03 DIAGNOSIS — R42 Dizziness and giddiness: Secondary | ICD-10-CM | POA: Diagnosis not present

## 2018-08-10 DIAGNOSIS — H81392 Other peripheral vertigo, left ear: Secondary | ICD-10-CM | POA: Diagnosis not present

## 2018-08-10 DIAGNOSIS — R42 Dizziness and giddiness: Secondary | ICD-10-CM | POA: Diagnosis not present

## 2018-08-10 DIAGNOSIS — H8192 Unspecified disorder of vestibular function, left ear: Secondary | ICD-10-CM | POA: Diagnosis not present

## 2018-08-18 DIAGNOSIS — H8192 Unspecified disorder of vestibular function, left ear: Secondary | ICD-10-CM | POA: Diagnosis not present

## 2018-08-18 DIAGNOSIS — R42 Dizziness and giddiness: Secondary | ICD-10-CM | POA: Diagnosis not present

## 2018-08-18 DIAGNOSIS — H81392 Other peripheral vertigo, left ear: Secondary | ICD-10-CM | POA: Diagnosis not present

## 2018-08-25 DIAGNOSIS — H8192 Unspecified disorder of vestibular function, left ear: Secondary | ICD-10-CM | POA: Diagnosis not present

## 2018-08-25 DIAGNOSIS — R42 Dizziness and giddiness: Secondary | ICD-10-CM | POA: Diagnosis not present

## 2018-08-25 DIAGNOSIS — H81392 Other peripheral vertigo, left ear: Secondary | ICD-10-CM | POA: Diagnosis not present

## 2018-09-01 ENCOUNTER — Ambulatory Visit: Payer: 59 | Admitting: Internal Medicine

## 2018-09-01 ENCOUNTER — Encounter: Payer: Self-pay | Admitting: Internal Medicine

## 2018-09-01 VITALS — BP 120/86 | HR 54 | Ht 65.0 in | Wt 165.0 lb

## 2018-09-01 DIAGNOSIS — R42 Dizziness and giddiness: Secondary | ICD-10-CM | POA: Diagnosis not present

## 2018-09-01 DIAGNOSIS — Z95810 Presence of automatic (implantable) cardiac defibrillator: Secondary | ICD-10-CM | POA: Diagnosis not present

## 2018-09-01 DIAGNOSIS — I469 Cardiac arrest, cause unspecified: Secondary | ICD-10-CM

## 2018-09-01 DIAGNOSIS — H81392 Other peripheral vertigo, left ear: Secondary | ICD-10-CM | POA: Diagnosis not present

## 2018-09-01 DIAGNOSIS — H8192 Unspecified disorder of vestibular function, left ear: Secondary | ICD-10-CM | POA: Diagnosis not present

## 2018-09-01 DIAGNOSIS — I471 Supraventricular tachycardia: Secondary | ICD-10-CM | POA: Diagnosis not present

## 2018-09-01 NOTE — Progress Notes (Signed)
Patient Care Team: Alroy Dust, L.Marlou Sa, MD as PCP - General (Family Medicine)   HPI  Julie Saunders is a 32 y.o. female Seen in follow-up for aborted cardiac arrest.  No specific cause was identified. She was treated with beta blockers initially but these were stopped because of hypotension and dizziness. During cardiac rehabilitation she had an episode with ICD discharges.    She has subsequently undergone EPS RFCA  And these records were reveiewed>>ectopic tachycardia successful  No intercurrent arrhythmia    12/17 Echo LF function normal   Her Crohn's with quite problematic we last saw her. Thankfully she has gone down to [facility] and is in much better condition  No sob, no edema  Some vague chest pains  Throbbing lasts secs  Dizziness now for months, initially 24/7 now somewhat improved; undergoing neuro eval   Date Cr K TSH  1/16     1.053  10/17 1.04 2.8 1.69   10/18 1.01 4.0   8/19 1.08 3.8    Reviewed family history, mother died suddenly 2 months before her daughter's cardiac arrest.  Somehow this association was missed.  I reviewed her mother's home records, nothing really is informative.  She had a history of mitral valve prolapse apparently according to the daughter no other cardiac history.  No echo is available.            Alb   Past Medical History:  Diagnosis Date  . 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 Allergy   . Bradycardia   . Cardiac arrest ([place]) 08/31/2014  . Crohn disease ([place])   . Depression   . History of blood transfusion 09/2012   "had allergic rxn to one of my Crohn's RX"   . Migraine    "a few times/yr" (06/08/2015)  . Pancytopenia ([place]) 11/23/2012  . Status post radiofrequency ablation for arrhythmia, atrial tach 06/08/15 06/08/2015  . SVT (supraventricular tachycardia) ([place]) 12/08/2014   likely an atrial tachycardia with CL 250 msec     Past Surgical History:  Procedure Laterality Date  . ELECTROPHYSIOLOGIC STUDY N/A 06/08/2015   Procedure: SVT Ablation;  Surgeon: Thompson Grayer, MD;  Location: [facility] LAB;  Service: Cardiovascular;  Laterality: N/A;  . ELECTROPHYSIOLOGY STUDY N/A 09/11/2014   Procedure: ELECTROPHYSIOLOGY STUDY;  Surgeon: Deboraha Sprang, MD;  Location: [facility] CATH LAB;  Service: Cardiovascular;  Laterality: N/A;  . IMPLANTABLE CARDIOVERTER DEFIBRILLATOR IMPLANT N/A 09/13/2014   Procedure: IMPLANTABLE CARDIOVERTER DEFIBRILLATOR IMPLANT;  Surgeon: Deboraha Sprang, MD;  Location: [facility] CATH LAB;  Service: Cardiovascular;  Laterality: N/A;; STJ dual chamber ICD implanted by Dr Caryl Comes for aborted cardiac arrest    Current Outpatient Medications  Medication Sig Dispense Refill  . acetaminophen (TYLENOL) 325 MG tablet Take 650 mg by mouth every 6 (six) hours as needed for headache.    . budesonide (ENTOCORT EC) 3 MG 24 hr capsule Take 3 mg by mouth every morning.  2  . calcium carbonate (CALCIUM 600) 600 MG TABS tablet Take 1,500 mg by mouth daily.    . cetirizine (ZYRTEC) 10 MG tablet Take 10 mg by mouth daily.    . Cholecalciferol (VITAMIN D3) 2000 units capsule Take 1 capsule by mouth 2 (two) times daily.    Marland Kitchen dimenhyDRINATE (DRAMAMINE) 50 MG tablet Take 50 mg by mouth daily as needed for dizziness.    . famotidine (PEPCID) 20 MG  tablet Take 20 mg by mouth daily.    . hyoscyamine (LEVSIN, ANASPAZ) 0.125 MG tablet Take 0.125 mg by mouth daily.    Marland Kitchen ibuprofen (ADVIL,MOTRIN) 600 MG tablet Take 1 tablet (600 mg total) every 6 (six) hours as needed by mouth. 30 tablet 0  . KLOR-CON M20 20 MEQ tablet TAKE 1 TABLET BY MOUTH EVERY DAY 90 tablet 1  . Pediatric Multiple Vit-C-FA (ANIMAL CHEWABLE MULTIVITAMIN PO) Take 1 tablet by mouth daily.    . rifaximin (XIFAXAN) 550 MG TABS tablet Take 550 mg by mouth 2 (two) times daily.    . SUMAtriptan (IMITREX) 100 MG tablet Take 100 mg by mouth every 2 (two) hours as needed for  migraine. May repeat in 2 hours if headache persists or recurs.    . ustekinumab (STELARA) 90 MG/ML SOSY injection Use as directed     No current facility-administered medications for this visit.     Allergies  Allergen Reactions  . Sulfa Antibiotics Rash  . Sulfacetamide Sodium Rash    Review of Systems negative except from HPI and PMH  Physical Exam BP 120/86   Pulse (!) 54   Ht 5' 5"  (1.651 m)   Wt 165 lb (74.8 kg)   SpO2 99%   BMI 27.46 kg/m  18Well developed and nourished in no acute distress HENT normal Neck supple with JVP-flat Clear Regular rate and rhythm, no murmurs or gallops Abd-soft with active BS No Clubbing cyanosis edema Skin-warm and dry A & Oriented  Grossly normal sensory and motor function   Assessment and  Plan  Aborted cardiac arrest  ICD St Jude   Supraventricular tachycardia  status post RFCA-JA  Crohn's disease  Dizziness   No interval ventricular arrhythmias  No interval atrial arrhythmias  Dizziness is being worked up by neurology.  Does not sound cardiac.  Can probably have an MRI   Reviewed the family history.  Suggested that there may be a role for genetic testing to further clarify the mechanism.  Her grandmother, her mother and she are all only siblings.  More than 50% of 40 min was spent in counseling related to the above

## 2018-09-01 NOTE — Patient Instructions (Signed)
Medication Instructions:  Your physician recommends that you continue on your current medications as directed. Please refer to the Current Medication list given to you today.  Labwork: None ordered.  Testing/Procedures: None ordered.  Follow-Up: Your physician recommends that you schedule a follow-up appointment in:   One Year with Dr Caryl Comes  Remote monitoring is used to monitor your Pacemaker of ICD from home. This monitoring reduces the number of office visits required to check your device to one time per year. It allows [place] to keep an eye on the functioning of your device to ensure it is working properly. You are scheduled for a device check from home on 12/03/2018. You may send your transmission at any time that day. If you have a wireless device, the transmission will be sent automatically. After your physician reviews your transmission, you will receive a postcard with your next transmission date.    Any Other Special Instructions Will Be Listed Below (If Applicable).     If you need a refill on your cardiac medications before your next appointment, please call your pharmacy.

## 2018-09-02 DIAGNOSIS — Z23 Encounter for immunization: Secondary | ICD-10-CM | POA: Diagnosis not present

## 2018-09-08 DIAGNOSIS — R42 Dizziness and giddiness: Secondary | ICD-10-CM | POA: Diagnosis not present

## 2018-09-08 DIAGNOSIS — H8192 Unspecified disorder of vestibular function, left ear: Secondary | ICD-10-CM | POA: Diagnosis not present

## 2018-09-08 DIAGNOSIS — H81392 Other peripheral vertigo, left ear: Secondary | ICD-10-CM | POA: Diagnosis not present

## 2018-09-22 ENCOUNTER — Other Ambulatory Visit: Payer: Self-pay | Admitting: Neurology

## 2018-09-22 ENCOUNTER — Telehealth: Payer: Self-pay | Admitting: Neurology

## 2018-09-22 ENCOUNTER — Ambulatory Visit: Payer: 59 | Admitting: Neurology

## 2018-09-22 ENCOUNTER — Encounter: Payer: Self-pay | Admitting: Neurology

## 2018-09-22 VITALS — BP 116/81 | HR 86 | Ht 65.0 in | Wt 168.0 lb

## 2018-09-22 DIAGNOSIS — H814 Vertigo of central origin: Secondary | ICD-10-CM

## 2018-09-22 DIAGNOSIS — D61818 Other pancytopenia: Secondary | ICD-10-CM | POA: Diagnosis not present

## 2018-09-22 DIAGNOSIS — Z95810 Presence of automatic (implantable) cardiac defibrillator: Secondary | ICD-10-CM

## 2018-09-22 DIAGNOSIS — K5080 Crohn's disease of both small and large intestine without complications: Secondary | ICD-10-CM | POA: Diagnosis not present

## 2018-09-22 DIAGNOSIS — Z2989 Encounter for other specified prophylactic measures: Secondary | ICD-10-CM

## 2018-09-22 DIAGNOSIS — I5022 Chronic systolic (congestive) heart failure: Secondary | ICD-10-CM | POA: Diagnosis not present

## 2018-09-22 DIAGNOSIS — Z298 Encounter for other specified prophylactic measures: Secondary | ICD-10-CM

## 2018-09-22 NOTE — Progress Notes (Signed)
[facility]   Provider:  Larey Seat, MD  Primary Care Physician:  Alroy Dust, Carlean Jews.Marlou Sa, MD   Referring Provider: Dr. Constance Holster , ENT .   Chief Complaint  Patient presents with  . New Patient (Initial Visit)    pt with dad, rm 108. pt states that in june she started having these dizzy spells. in june she started to have this 24 hrs with no relief. it has lessened over the course of the months. now the dizziness is more noticable with certain head movements, position changes, pt still has these dizzy spells daily. denies any chanes with vision. RN Mardene Celeste.    HPI:  Julie Saunders is a 32 y.o. female patient and seen here on 09-22-2018 as in a referral from ENT Dr. Constance Holster for evaluation of chronic dizziness.   Chief complaint according to patient :  [name] is a 32 year old caucasian female with a history of chronic dizziness since June 12 th. 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 This time her dizziness felt different, not movement induced. She used to feel lightheaded. Now she had a chronic feeling of the room was spinning around her counterclockwise, sometimes she felt as if she tumbled . The symptoms have lost a bit of their intensity but there has not been a day result of vertigo spell, and there is a feeling of  being in motion when actually not. She was evaluated by her primary care physician, given medications to suppress the vertigo but felt that this had not a major effect, the medication of choice was Dramamine.  Valium was also prescribed and helped actually more it made her able to finally go through vestibular rehab and physical therapy.  She does not medicate in preparation of vestibular therapy.  She underwent upper orders of Dr. Constance Holster and Dix-Hallpike exam with audiologist Victorino Sparrow, spontaneous nystagmus was negative, DHP was negative, roll test was negative and supine test was negative as well her impression was  that the patient was not suffering from a benign positional vertigo.  The physical therapy however had given her some improvement to where she feels that she can function.  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 Her cardiologist stated she can have a MRI. Dr Virl Axe, MD. She also has a history of Crohn's disease is  currently treated with  Stelara, a monoclonal antibody therapy. She is on Entocort 6 mg by mouth every morning.  She is also on Klor-Con, rifaximin, she has been prescribed Imitrex, Pepcid, vitamin D supplement, Zyrtec and Tylenol as needed.   Medical history : She has a history of sudden cardiac arrest, resuscitation underwent cardiac surgery and cardiac defibrillator placement in 2015. Her cardiologist stated she can have a MRI. Dr Virl Axe, MD. She also has a history of Crohn's disease is  currently treated with  Stelara, a monoclonal antibody therapy. She is on Entocort 6 mg by mouth every morning.  She is also on Klor-Con, rifaximin, she has been prescribed Imitrex, Pepcid, vitamin D supplement, Zyrtec and Tylenol as needed.    Social history: not gainfully employed, not driving, not smoking, rarely drinking alcohol.  Caffeine - soda , iced tea , not daily.   Review of Systems: Out of a complete 14 system review, the patient complains of only the following symptoms, and all other reviewed systems are negative.  Non positional vertigo in a patient with history of cardiac arrest 1994, defibrillator implant and Crohn's disease in MAB therapy ( Dr. Alisia Ferrari @[facility]    Social History   Socioeconomic History  . Marital status: Single    Spouse name: Not on file  . Number of children: Not on file  . Years of education: Not on file  . Highest education level: Not on file  Occupational History  . Not on file  Social Needs  . Financial resource strain: Not on file    . Food insecurity:    Worry: Not on file    Inability: Not on file  . Transportation needs:    Medical: Not on file    Non-medical: Not on file  Tobacco Use  . Smoking status: Never Smoker  . Smokeless tobacco: Never Used  Substance and Sexual Activity  . Alcohol use: Yes    Alcohol/week: 1.0 standard drinks    Types: 1 Standard drinks or equivalent per week    Comment: 06/08/2015 "might have a couple drinks once/month; wine or mixed"  . Drug use: No  . Sexual activity: Never  Lifestyle  . Physical activity:    Days per week: Not on file    Minutes per session: Not on file  . Stress: Not on file  Relationships  . Social connections:    Talks on phone: Not on file                           . Intimate partner violence:                      Other Topics Concern  . Not on file  Social History Narrative  . Not on file    Family History  Problem Relation Age of Onset  . Heart attack Mother   . Heart attack Maternal Grandmother   . Diabetes Maternal Grandfather   . Asthma Paternal Grandmother   . Diabetes Paternal Grandfather     Past Medical History:  Diagnosis Date  . 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 Allergy   . Bradycardia   . Cardiac arrest ([place]) 08/31/2014  . Crohn disease ([place])   . Depression   . History of blood transfusion 09/2012   "had allergic rxn to one of my Crohn's RX"   . Migraine    "a few times/yr" (06/08/2015)  . Pancytopenia ([place]) 11/23/2012  . Status post radiofrequency ablation for arrhythmia, atrial tach 06/08/15 06/08/2015  . SVT (supraventricular tachycardia) ([place]) 12/08/2014   likely an atrial tachycardia with CL 250 msec    Past Surgical History:  Procedure Laterality Date  . ELECTROPHYSIOLOGIC STUDY N/A 06/08/2015   Procedure: SVT Ablation;  Surgeon: Thompson Grayer, MD;  Location: [facility];  Service: Cardiovascular;   Laterality: N/A;  . ELECTROPHYSIOLOGY STUDY N/A 09/11/2014   Procedure: ELECTROPHYSIOLOGY STUDY;  Surgeon: Deboraha Sprang, MD;  Location: [facility] CATH LAB;  Service: Cardiovascular;  Laterality: N/A;  . IMPLANTABLE CARDIOVERTER DEFIBRILLATOR IMPLANT N/A 09/13/2014   Procedure: IMPLANTABLE CARDIOVERTER DEFIBRILLATOR IMPLANT;  Surgeon: Deboraha Sprang, MD;  Location: [facility] C[facility] LAB;  Service: Cardiovascular;  Laterality: N/A;; STJ dual chamber ICD implanted by Dr Caryl Comes for aborted cardiac arrest    Current Outpatient Medications  Medication Sig Dispense Refill  . acetaminophen (TYLENOL) 325 MG tablet Take 650 mg  by mouth every 6 (six) hours as needed for headache.    . budesonide (ENTOCORT EC) 3 MG 24 hr capsule Take 6 mg by mouth every morning.   2  . calcium carbonate (CALCIUM 600) 600 MG TABS tablet Take 1,500 mg by mouth daily.    . cetirizine (ZYRTEC) 10 MG tablet Take 10 mg by mouth daily.    . Cholecalciferol (VITAMIN D3) 2000 units capsule Take 1 capsule by mouth 2 (two) times daily.    . famotidine (PEPCID) 20 MG tablet Take 20 mg by mouth daily.    . hyoscyamine (LEVSIN, ANASPAZ) 0.125 MG tablet Take 0.125 mg by mouth daily.    Marland Kitchen KLOR-CON M20 20 MEQ tablet TAKE 1 TABLET BY MOUTH EVERY DAY 90 tablet 1  . Pediatric Multiple Vit-C-FA (ANIMAL CHEWABLE MULTIVITAMIN PO) Take 1 tablet by mouth daily.    . rifaximin (XIFAXAN) 550 MG TABS tablet Take 550 mg by mouth 2 (two) times daily.    . SUMAtriptan (IMITREX) 100 MG tablet Take 100 mg by mouth every 2 (two) hours as needed for migraine. May repeat in 2 hours if headache persists or recurs.    . ustekinumab (STELARA) 90 MG/ML SOSY injection Use as directed     No current facility-administered medications for this visit.     Allergies as of 09/22/2018 - Review Complete 09/22/2018  Allergen Reaction Noted  . Sulfa antibiotics Rash 09/23/2012  . Sulfacetamide sodium Rash 03/27/2015    Vitals: Ht 5\' 5"  (1.651 m)   Wt 168 lb (76.2 kg)   BMI 27.96  kg/m  Last Weight:  Wt Readings from Last 1 Encounters:  09/22/18 168 lb (76.2 kg)   DXA:JOIN mass index is 27.96 kg/m.     Last Height:   Ht Readings from Last 1 Encounters:  09/22/18 5\' 5"  (1.651 m)    The patient's heart rate in supine position was 62 bpm, blood pressure on her left arm 113/71 mmHg, seated position heart rate 70 bpm blood pressure 115/75 mmHg, standing position blood pressure 160/81 heart rate 86 bpm.    Patient has a weight of 168 pounds with clothing, and a height of 5 foot 5 inches.    From sitting to standing she did not have any changes in her perception of dizziness when she rolls from a supine resting position to a seated position the dizziness was felt.    Physical exam:  General: The patient is awake, alert and appears not in acute distress. The patient is well groomed. Head: Normocephalic, atraumatic. Neck is supple. ,  neck circumference:14.5 . Nasal airflow patent , Cardiovascular:  Regular rate and rhythm , without  murmurs or carotid bruit, and without distended neck veins. Respiratory: Lungs are clear to auscultation. Skin:  Without evidence of edema, or rash Trunk: BMI is 28. The patient's posture is erect.  Neurologic exam : The patient is awake and alert, oriented to place and time.   Speech is fluent,  without dysarthria, dysphonia or aphasia.  Mood and affect are appropriate.  Cranial nerves: Pupils are unequal- the left pupil appear up to 2 mm larger but is reative to light, direct and consensual- briskly reactive to light. Accomodation is intact .  Funduscopic exam without evidence of pallor or edema. Extraocular movements  in vertical and horizontal planes intact and without nystagmus.  Visual fields by finger perimetry are intact. Hearing to finger rub intact.   Facial sensation intact to fine touch.  Facial motor strength is symmetric  and tongue and uvula move midline. Shoulder shrug was symmetrical.   Motor exam:   Normal tone,  muscle bulk and symmetric strength in all extremities.  Sensory:  Fine touch, pinprick and vibration were tested in all extremities. Proprioception tested in the upper extremities was normal.  Coordination: Rapid alternating movements in the fingers/hands was normal. Finger-to-nose maneuver  normal without evidence of ataxia, dysmetria or tremor.  Gait and station: Patient walks without assistive device and is able unassisted to climb up to the exam table. Strength within normal limits.  Stance is stable and normal.   Turns with 4 Steps. Romberg testing is positive - lightheadedness. The same when sitting with eyes closed.   Deep tendon reflexes: in the  upper and lower extremities are symmetric and intact. Babinski maneuver response is  downgoing.    Assessment:  After physical and neurologic examination, review of laboratory studies,  Personal review of imaging studies, reports of other /same  Imaging studies, results of polysomnography and / or neurophysiology testing and pre-existing records as far as provided in visit., my assessment is   1) Non positional dizziness with vertigo character , room spinning counter-clock wise. No relationship to time of day, to last mealtime or hydration and only elicited here with eyes closed and while rising from a supine position.    2) Central vertigo related to postural changes. Blood flow?  Not likely , looking at her normal orthostatic vitals.  Her cardiologist confirmed no defibrillator alarms or discharges. ENT notes reviewed.   Question: could this be a delayed effect of MAB therapy? Pontine ischemia?  Will attempt CT Brain , with contrast and if suspecious for stroke would order CT angiogram.  MAB therapy can activate dormant CNS virea. Crohn's disease.    The patient was advised of the nature of the diagnosed disorder , the treatment options and the  risks for general health and wellness arising from not treating the condition.   I spent  more than 50 minutes of face to face time with the patient. Greater than 50% of time was spent in counseling and coordination of care. We have discussed the diagnosis and differential and I answered the patient's questions.    Plan:  Treatment plan and additional workup :  CT brain with contrast and without. CMET ordered. Magnesium, phosphorus, has a tendency to dehydrate.    Larey Seat, MD 50/07/3817, 2:99 PM  Certified in Neurology by ABPN Certified in [place] by [facility] [address], [place], [place] [address]

## 2018-09-22 NOTE — Addendum Note (Signed)
Addended by: Larey Seat on: 09/22/2018 03:26 PM   Modules accepted: Orders

## 2018-09-22 NOTE — Telephone Encounter (Signed)
[employer] Auth: [id number] (exp. 09/22/18 to 11/06/18) order sent to GI pt is aware. I informed him if she has not heard in the next 2-3 business days to give them a call.

## 2018-09-23 ENCOUNTER — Telehealth: Payer: Self-pay | Admitting: Neurology

## 2018-09-23 DIAGNOSIS — Z95810 Presence of automatic (implantable) cardiac defibrillator: Secondary | ICD-10-CM

## 2018-09-23 DIAGNOSIS — I5022 Chronic systolic (congestive) heart failure: Secondary | ICD-10-CM

## 2018-09-23 DIAGNOSIS — D61818 Other pancytopenia: Secondary | ICD-10-CM

## 2018-09-23 DIAGNOSIS — H814 Vertigo of central origin: Secondary | ICD-10-CM

## 2018-09-23 DIAGNOSIS — D849 Immunodeficiency, unspecified: Secondary | ICD-10-CM

## 2018-09-23 DIAGNOSIS — Z298 Encounter for other specified prophylactic measures: Secondary | ICD-10-CM

## 2018-09-23 LAB — CBC WITH DIFFERENTIAL/PLATELET
BASOS ABS: 0.1 10*3/uL (ref 0.0–0.2)
Basos: 1 %
EOS (ABSOLUTE): 0.2 10*3/uL (ref 0.0–0.4)
Eos: 2 %
HEMOGLOBIN: 12.2 g/dL (ref 11.1–15.9)
Hematocrit: 37.1 % (ref 34.0–46.6)
Immature Grans (Abs): 0 10*3/uL (ref 0.0–0.1)
Immature Granulocytes: 0 %
LYMPHS ABS: 2.2 10*3/uL (ref 0.7–3.1)
Lymphs: 21 %
MCH: 27.5 pg (ref 26.6–33.0)
MCHC: 32.9 g/dL (ref 31.5–35.7)
MCV: 84 fL (ref 79–97)
MONOCYTES: 12 %
MONOS ABS: 1.2 10*3/uL — AB (ref 0.1–0.9)
NEUTROS ABS: 6.7 10*3/uL (ref 1.4–7.0)
Neutrophils: 64 %
Platelets: 402 10*3/uL (ref 150–450)
RBC: 4.44 x10E6/uL (ref 3.77–5.28)
RDW: 13.2 % (ref 12.3–15.4)
WBC: 10.5 10*3/uL (ref 3.4–10.8)

## 2018-09-23 LAB — COMPREHENSIVE METABOLIC PANEL
ALT: 10 IU/L (ref 0–32)
AST: 9 IU/L (ref 0–40)
Albumin/Globulin Ratio: 1.1 — ABNORMAL LOW (ref 1.2–2.2)
Albumin: 3.4 g/dL — ABNORMAL LOW (ref 3.5–5.5)
Alkaline Phosphatase: 78 IU/L (ref 39–117)
BUN/Creatinine Ratio: 15 (ref 9–23)
BUN: 15 mg/dL (ref 6–20)
Bilirubin Total: <0.2 mg/dL (ref 0.0–1.2)
CALCIUM: 9 mg/dL (ref 8.7–10.2)
CO2: 24 mmol/L (ref 20–29)
Chloride: 104 mmol/L (ref 96–106)
Creatinine, Ser: 1.01 mg/dL — ABNORMAL HIGH (ref 0.57–1.00)
GFR calc Af Amer: 86 mL/min/{1.73_m2} (ref >59)
GFR, EST NON AFRICAN AMERICAN: 74 mL/min/{1.73_m2} (ref >59)
GLUCOSE: 81 mg/dL (ref 65–99)
Globulin, Total: 3.2 g/dL (ref 1.5–4.5)
POTASSIUM: 4.3 mmol/L (ref 3.5–5.2)
Sodium: 142 mmol/L (ref 134–144)
Total Protein: 6.6 g/dL (ref 6.0–8.5)

## 2018-09-23 NOTE — Telephone Encounter (Signed)
I got the auth for [place] Auth: [id number] (exp. 09/23/18 to 11/07/18)  I spoke to GI and they informed me per their practical they do the CT Head first and then do the Angio head. I asked to make sure the patient will have both on the same day and they said yes.

## 2018-09-23 NOTE — Telephone Encounter (Signed)
Called the patient and made her aware that the lab work all looked good. Advised the patient that creatinine level was slightly elevated but didn't seem to be any concern with moving forward with completing the CT scan. Pt verbalized understanding. Pt had no questions at this time but was encouraged to call back if questions arise.

## 2018-09-23 NOTE — Telephone Encounter (Signed)
-----   Message from Larey Seat, MD sent at 09/23/2018  8:30 AM EST ----- Normal liver function and CBC count- slight elevation in creatinine may be related to not enough water intake, monocytosis is unexplained- but same absolute count in comparison to previous years.  We can use contrast for head CT, defibrillator excludes her from MRI testing.

## 2018-09-23 NOTE — Telephone Encounter (Signed)
Add CT angio to CT head.

## 2018-09-27 ENCOUNTER — Ambulatory Visit
Admission: RE | Admit: 2018-09-27 | Discharge: 2018-09-27 | Disposition: A | Discharge status: Home / Self Care | Payer: 59 | Source: Ambulatory Visit | Attending: Neurology | Admitting: Neurology

## 2018-09-27 ENCOUNTER — Other Ambulatory Visit: Payer: 59

## 2018-09-27 DIAGNOSIS — R42 Dizziness and giddiness: Secondary | ICD-10-CM | POA: Diagnosis not present

## 2018-09-27 DIAGNOSIS — D849 Immunodeficiency, unspecified: Secondary | ICD-10-CM

## 2018-09-27 DIAGNOSIS — D61818 Other pancytopenia: Secondary | ICD-10-CM

## 2018-09-27 DIAGNOSIS — H814 Vertigo of central origin: Secondary | ICD-10-CM

## 2018-09-27 DIAGNOSIS — Z95810 Presence of automatic (implantable) cardiac defibrillator: Secondary | ICD-10-CM

## 2018-09-27 DIAGNOSIS — I5022 Chronic systolic (congestive) heart failure: Secondary | ICD-10-CM

## 2018-09-27 MED ORDER — IOPAMIDOL (ISOVUE-370) INJECTION 76%
75.0000 mL | Freq: Once | INTRAVENOUS | Status: AC | PRN
  Administered 2018-09-27: 75 mL via INTRAVENOUS

## 2018-09-30 ENCOUNTER — Telehealth: Payer: Self-pay | Admitting: Neurology

## 2018-09-30 NOTE — Telephone Encounter (Signed)
-----   Message from Larey Seat, MD sent at 09/28/2018  4:25 PM EST ----- IMPRESSION: 1. Normal CT HEAD with and without contrast. 2. Normal CTA HEAD.   Electronically Signed   By: Elon Alas M.D.   On: 09/27/2018 16:57

## 2018-09-30 NOTE — Telephone Encounter (Signed)
Called the patient and informed her that the CT Head with and without contrast as well as CTA was normal and nothing of any clinical concern. Pt verbalized understanding. Pt had no questions at this time but was encouraged to call back if questions arise.

## 2018-10-09 LAB — CUP PACEART INCLINIC DEVICE CHECK
Brady Statistic RA Percent Paced: 28 %
Brady Statistic RV Percent Paced: 0.08 %
HighPow Impedance: 62 Ohm
Implantable Lead Implant Date: 20151104
Implantable Lead Location: 753860
Implantable Lead Model: 5076
Implantable Pulse Generator Implant Date: 20151104
Lead Channel Impedance Value: 325 Ohm
Lead Channel Pacing Threshold Amplitude: 0.5 V
Lead Channel Pacing Threshold Amplitude: 0.75 V
Lead Channel Pacing Threshold Pulse Width: 0.4 ms
Lead Channel Sensing Intrinsic Amplitude: 5 mV
Lead Channel Sensing Intrinsic Amplitude: 9.7 mV
Lead Channel Setting Pacing Amplitude: 2 V
Lead Channel Setting Pacing Pulse Width: 0.4 ms
Lead Channel Setting Sensing Sensitivity: 0.5 mV
MDC IDC LEAD IMPLANT DT: 20151104
MDC IDC LEAD LOCATION: 753859
MDC IDC MSMT BATTERY REMAINING LONGEVITY: 61 mo
MDC IDC MSMT LEADCHNL RA IMPEDANCE VALUE: 412.5 Ohm
MDC IDC MSMT LEADCHNL RA PACING THRESHOLD PULSEWIDTH: 0.4 ms
MDC IDC PG SERIAL: 7179320
MDC IDC SESS DTM: 20191023193547
MDC IDC SET LEADCHNL RV PACING AMPLITUDE: 2.5 V

## 2018-10-19 DIAGNOSIS — H52202 Unspecified astigmatism, left eye: Secondary | ICD-10-CM | POA: Diagnosis not present

## 2018-10-19 DIAGNOSIS — H5213 Myopia, bilateral: Secondary | ICD-10-CM | POA: Diagnosis not present

## 2018-10-28 DIAGNOSIS — Z79899 Other long term (current) drug therapy: Secondary | ICD-10-CM | POA: Diagnosis not present

## 2018-10-28 DIAGNOSIS — K50919 Crohn's disease, unspecified, with unspecified complications: Secondary | ICD-10-CM | POA: Diagnosis not present

## 2018-10-28 DIAGNOSIS — K6389 Other specified diseases of intestine: Secondary | ICD-10-CM | POA: Diagnosis not present

## 2018-11-23 ENCOUNTER — Ambulatory Visit: Payer: 59 | Admitting: Neurology

## 2018-11-23 ENCOUNTER — Telehealth: Payer: Self-pay | Admitting: Neurology

## 2018-11-23 ENCOUNTER — Encounter: Payer: Self-pay | Admitting: Neurology

## 2018-11-23 ENCOUNTER — Other Ambulatory Visit: Payer: Self-pay | Admitting: Neurology

## 2018-11-23 VITALS — BP 98/68 | HR 68 | Ht 65.0 in | Wt 142.0 lb

## 2018-11-23 DIAGNOSIS — Z95810 Presence of automatic (implantable) cardiac defibrillator: Secondary | ICD-10-CM

## 2018-11-23 DIAGNOSIS — K5080 Crohn's disease of both small and large intestine without complications: Secondary | ICD-10-CM

## 2018-11-23 DIAGNOSIS — I5022 Chronic systolic (congestive) heart failure: Secondary | ICD-10-CM

## 2018-11-23 DIAGNOSIS — D61818 Other pancytopenia: Secondary | ICD-10-CM

## 2018-11-23 DIAGNOSIS — H814 Vertigo of central origin: Secondary | ICD-10-CM

## 2018-11-23 DIAGNOSIS — G43711 Chronic migraine without aura, intractable, with status migrainosus: Secondary | ICD-10-CM

## 2018-11-23 DIAGNOSIS — S069X9A Unspecified intracranial injury with loss of consciousness of unspecified duration, initial encounter: Secondary | ICD-10-CM

## 2018-11-23 DIAGNOSIS — S069XAA Unspecified intracranial injury with loss of consciousness status unknown, initial encounter: Secondary | ICD-10-CM

## 2018-11-23 DIAGNOSIS — R42 Dizziness and giddiness: Secondary | ICD-10-CM

## 2018-11-23 MED ORDER — GALCANEZUMAB-GNLM 120 MG/ML ~~LOC~~ SOAJ
240.0000 mg | Freq: Once | SUBCUTANEOUS | Status: AC
  Administered 2018-11-23: 240 mg via SUBCUTANEOUS

## 2018-11-23 MED ORDER — GALCANEZUMAB-GNLM 120 MG/ML ~~LOC~~ SOAJ: 120.0000 mg | SUBCUTANEOUS | 11 refills | Status: DC

## 2018-11-23 MED ORDER — GALCANEZUMAB-GNLM 120 MG/ML ~~LOC~~ SOAJ: 120.0000 mg | Freq: Once | SUBCUTANEOUS | Status: DC

## 2018-11-23 NOTE — Progress Notes (Signed)
Gave the patient first dose of emgality today in the office. One injection was in right arm SQ and the other in the left arm SQ. Reviewed side effects and what to look for at the injection sites. Patient tolerated both injections well here in the office.

## 2018-11-23 NOTE — Progress Notes (Signed)
[facility]   Provider:  Larey Seat, MD   Primary Care Physician:  Alroy Dust, Carlean Jews.Marlou Sa, MD   Referring Provider: Dr. Constance Holster , ENT .   Chief Complaint  Patient presents with  . New Patient (Initial Visit)    pt with dad, rm 65. pt states that in june she started having these dizzy spells. in june she started to have this 24 hrs with no relief. it has lessened over the course of the months. now the dizziness is more noticable with certain head movements, position changes, pt still has these dizzy spells daily. denies any chanes with vision. RN Mardene Celeste.    Interval visit on 11-23-2018 with patient and her father. She had gotten much better after our initial visit, but by Christmas time / new year had more spells, attributed this to the warm weather.  More migraines and more vertigo- any fast movements can trigger vertigo, any position changes.  Orthostatics were not done today. Vision is normal, bur left pupil is much larger than right.   HPI:  JAYLYNE BREESE is a 33 y.o. female patient and seen here on 09-22-2018 as in a referral from ENT Dr. Constance Holster for evaluation of chronic dizziness.  Chief complaint according to patient :  Ms. Eley is a 33 year old caucasian female with a history of chronic dizziness since June 12 th. 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 This time her dizziness felt different, not movement induced. She used to feel lightheaded. Now she had a chronic feeling of the room was spinning around her counterclockwise, sometimes she felt as if she tumbled . The symptoms have lost a bit of their intensity but there has not been a day result of vertigo spell, and there is a feeling of  being in motion when actually not. She was evaluated by her primary care physician, given medications to suppress the vertigo but felt that this had not a major effect, the medication of choice was Dramamine.  Valium was also prescribed  and helped actually more it made her able to finally go through vestibular rehab and physical therapy.  She does not medicate in preparation of vestibular therapy.  She underwent testing with  Dr. Constance Holster and Dix-Hallpike exam with audiologist Victorino Sparrow, spontaneous nystagmus was negative, DHP was negative, roll test was negative and supine test was negative as well her impression was that the patient was not suffering from a benign positional vertigo.  The physical therapy however had given her some improvement to where she feels that she can function.  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 Her cardiologist stated she can have a MRI. Dr Virl Axe, MD. She also has a history of Crohn's disease is  currently treated with  Stelara, a monoclonal antibody therapy. She is on Entocort 6 mg by mouth every morning.  She is also on Klor-Con, rifaximin, she has been prescribed Imitrex, Pepcid, vitamin D supplement, Zyrtec and Tylenol as needed.   Medical history : She has a history of sudden cardiac arrest, resuscitation underwent cardiac surgery and cardiac defibrillator placement in 2015. Her cardiologist stated she can have a MRI. Dr Virl Axe, MD. She also has a history of Crohn's disease is  currently treated with  Stelara, a monoclonal antibody therapy. She is on Entocort 6 mg by mouth every morning.  She is also on Klor-Con, rifaximin,  she has been prescribed Imitrex, Pepcid, vitamin D supplement, Zyrtec and Tylenol as needed.  Social history: not gainfully employed, not driving, not smoking, rarely drinking alcohol.  Caffeine - soda , iced tea , not daily.   Review of Systems: Out of a complete 14 system review, the patient complains of only the following symptoms, and all other reviewed systems are negative.   Non positional vertigo in a patient with history of cardiac arrest 1994, defibrillator implant  and Crohn's disease in MAB therapy ( Dr. Alisia Ferrari @[facility]) Now having more spells again, migraine and Vertigo.    Crohn's disease, cardiac arrest and AICD.  EXAM: CT ANGIOGRAPHY HEAD  TECHNIQUE: Multidetector CT imaging of the head was performed using the standard protocol during bolus administration of intravenous contrast. Multiplanar CT image reconstructions and MIPs were obtained to evaluate the vascular anatomy.  CONTRAST:  87mL ISOVUE-370 IOPAMIDOL (ISOVUE-370) INJECTION 76%  COMPARISON:  CT HEAD August 31, 2014  FINDINGS: CT HEAD  BRAIN: No intraparenchymal hemorrhage, mass effect nor midline shift. The ventricles and sulci are normal, cavum septum pellucidum. No acute large vascular territory infarcts. No abnormal extra-axial fluid collections. Basal cisterns are patent.  VASCULAR: Unremarkable.  SKULL/SOFT TISSUES: No skull fracture. No significant soft tissue swelling.  ORBITS/SINUSES: The included ocular globes and orbital contents are normal.Lobulated paranasal sinus mucosal thickening without air-fluid levels. Mastoid air cells are well aerated.  OTHER: None.  CTA HEAD  ANTERIOR CIRCULATION: Patent cervical internal carotid arteries, petrous, cavernous and supra clinoid internal carotid arteries. Patent anterior communicating artery. Patent anterior and middle cerebral arteries.  No large vessel occlusion, significant stenosis, contrast extravasation or aneurysm.  POSTERIOR CIRCULATION: Patent vertebral arteries, vertebrobasilar junction and basilar artery, as well as main branch vessels. RIGHT vertebral artery is dominant. Patent posterior cerebral arteries. Robust bilateral posterior communicating arteries present.  No large vessel occlusion, significant stenosis, contrast extravasation or aneurysm.  VENOUS SINUSES: Major dural venous sinuses are patent though not tailored for evaluation on this angiographic  examination.  ANATOMIC VARIANTS: None.  DELAYED PHASE: No abnormal intracranial enhancement.  MIP images reviewed.  IMPRESSION: 1. Normal CT HEAD with and without contrast. 2. Normal CTA HEAD.  Social History   Socioeconomic History  . Marital status: Single    Spouse name: Not on file  . Number of children: Not on file  . Years of education: Not on file  . Highest education level: Not on file  Occupational History  . Not on file  Social Needs  . Financial resource strain: Not on file  . Food insecurity:    Worry: Not on file    Inability: Not on file  . Transportation needs:    Medical: Not on file    Non-medical: Not on file  Tobacco Use  . Smoking status: Never Smoker  . Smokeless tobacco: Never Used  Substance and Sexual Activity  . Alcohol use: Yes    Alcohol/week: 1.0 standard drinks    Types: 1 Standard drinks or equivalent per week    Comment: 06/08/2015 "might have a couple drinks once/month; wine or mixed"  . Drug use: No  . Sexual activity: Never  Lifestyle  . Physical activity:    Days per week: Not on file    Minutes per session: Not on file  . Stress: Not on file  Relationships  . Social connections:    Talks on phone: Not on file                           .  Intimate partner violence:                      Other Topics Concern  . Not on file  Social History Narrative  . Not on file    Family History  Problem Relation Age of Onset  . Heart attack Mother   . Migraines Mother   . Heart disease Mother   . Heart attack Maternal Grandmother   . Heart disease Maternal Grandmother   . Diabetes Maternal Grandfather   . Asthma Paternal Grandmother   . Diabetes Paternal Grandfather     Past Medical History:  Diagnosis Date  . 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 Allergy   . Bradycardia   . Cardiac arrest ([place]) 08/31/2014  . Crohn  disease ([place])   . Depression   . History of blood transfusion 09/2012   "had allergic rxn to one of my Crohn's RX"   . Migraine    "a few times/yr" (06/08/2015)  . Pancytopenia ([place]) 11/23/2012  . Status post radiofrequency ablation for arrhythmia, atrial tach 06/08/15 06/08/2015  . SVT (supraventricular tachycardia) ([place]) 12/08/2014   likely an atrial tachycardia with CL 250 msec    Past Surgical History:  Procedure Laterality Date  . ABLATION    . ELECTROPHYSIOLOGIC STUDY N/A 06/08/2015   Procedure: SVT Ablation;  Surgeon: Thompson Grayer, MD;  Location: [place] [facility];  Service: Cardiovascular;  Laterality: N/A;  . ELECTROPHYSIOLOGY STUDY N/A 09/11/2014   Procedure: ELECTROPHYSIOLOGY STUDY;  Surgeon: Deboraha Sprang, MD;  Location: [facility] CATH LAB;  Service: Cardiovascular;  Laterality: N/A;  . IMPLANTABLE CARDIOVERTER DEFIBRILLATOR IMPLANT N/A 09/13/2014   Procedure: IMPLANTABLE CARDIOVERTER DEFIBRILLATOR IMPLANT;  Surgeon: Deboraha Sprang, MD;  Location: [facility] CATH LAB;  Service: Cardiovascular;  Laterality: N/A;; STJ dual chamber ICD implanted by Dr Caryl Comes for aborted cardiac arrest    Current Outpatient Medications  Medication Sig Dispense Refill  . acetaminophen (TYLENOL) 325 MG tablet Take 650 mg by mouth every 6 (six) hours as needed for headache.    . budesonide (ENTOCORT EC) 3 MG 24 hr capsule Take 3 mg by mouth every morning. She is tapering off the medication jan 19th will be last dose  2  . calcium carbonate (CALCIUM 600) 600 MG TABS tablet Take 1,500 mg by mouth daily.    . cetirizine (ZYRTEC) 10 MG tablet Take 10 mg by mouth daily.    . Cholecalciferol (VITAMIN D3) 2000 units capsule Take 1 capsule by mouth 2 (two) times daily.    . famotidine (PEPCID) 20 MG tablet Take 20 mg by mouth daily.    . hyoscyamine (LEVSIN, ANASPAZ) 0.125 MG tablet Take 0.125 mg by mouth daily.    Marland Kitchen KLOR-CON M20 20 MEQ tablet TAKE 1 TABLET BY MOUTH EVERY DAY 90 tablet 1  . Pediatric Multiple Vit-C-FA  (ANIMAL CHEWABLE MULTIVITAMIN PO) Take 1 tablet by mouth daily.    . rifaximin (XIFAXAN) 550 MG TABS tablet Take 550 mg by mouth 2 (two) times daily.    . SUMAtriptan (IMITREX) 100 MG tablet Take 100 mg by mouth every 2 (two) hours as needed for migraine. May repeat in 2 hours if headache persists or recurs.    . ustekinumab (STELARA) 90 MG/ML SOSY injection Use as directed     No current facility-administered medications for this visit.     Allergies as of 11/23/2018 - Review Complete  11/23/2018  Allergen Reaction Noted  . Sulfa antibiotics Rash 09/23/2012  . Sulfacetamide sodium Rash 03/27/2015    Vitals: BP 122/78   Pulse (!) 55   Ht 5\' 5"  (1.651 m)   Wt 142 lb (64.4 kg)   BMI 23.63 kg/m  Last Weight:  Wt Readings from Last 1 Encounters:  11/23/18 142 lb (64.4 kg)   DGU:YQIH mass index is 23.63 kg/m.     Last Height:   Ht Readings from Last 1 Encounters:  11/23/18 5\' 5"  (1.651 m)    Headaches 4-6 migraines a month. Other tension headaches are also present, pressure headaches.   Physical exam:  General: The patient is awake, alert and appears not in acute distress. The patient is well groomed. Head: Normocephalic, atraumatic. Neck is supple.  neck circumference:14.25 . Nasal airflow patent , Cardiovascular:  Regular rate and rhythm , without  murmurs or carotid bruit, and without distended neck veins. Respiratory: Lungs are clear to auscultation. Skin:  Without evidence of edema, or rash Trunk: BMI is 28. The patient's posture is erect.  Neurologic exam : The patient is awake and alert, oriented to place and time.   Speech is fluent  Mood and affect are appropriate.  Cranial nerves: very sensitive to olfactory stimuli - burned rubber, smoke, food smells. Odors.  Pupils are unequal- the left pupil appear up to 2 mm larger but is reative to light, direct and consensual- briskly reactive to light. Accomodation is intact-  Funduscopic exam without evidence of pallor or  edema. Extraocular movements  in vertical and horizontal planes intact and without nystagmus.  Visual fields by finger perimetry are intact.  VERTIGO provoked by rapid head movements.  Hearing to finger rub intact.  Facial sensation intact to fine touch. Facial motor strength is symmetric and tongue and uvula move midline. Shoulder shrug was symmetrical.   Motor exam:   Normal tone, muscle bulk and symmetric strength in all extremities.  Sensory:  Proprioception tested in the upper extremities was normal.  Coordination: Finger-to-nose maneuver  normal without evidence of ataxia, dysmetria or tremor.  Gait and station: Patient walks without assistive device Strength within normal limits. Stance is stable and normal. Turns with 4 Steps. Romberg testing is positive - lightheadedness. The same when sitting with eyes closed.   Deep tendon reflexes: in the  upper and lower extremities are symmetric and intact. Babinski maneuver response is  downgoing.    Assessment:  After physical and neurologic examination, review of laboratory studies,  Personal review of imaging studies, reports of other /same  Imaging studies, results of polysomnography and / or neurophysiology testing and pre-existing records as far as provided in visit., my assessment is   1) Non positional dizziness with vertigo character , room spinning counter-clock wise. No relationship to time of day, to last mealtime or hydration and only elicited here with eyes closed and while rising from a supine position.    2) Central vertigo related to postural changes. Blood flow?  Not likely , looking at her normal orthostatic vitals.  Her cardiologist confirmed no defibrillator alarms or discharges. ENT notes reviewed.   Question: could this be a delayed effect of MAB therapy? Pontine ischemia?  Will attempt CT Brain , with contrast and if suspecious for stroke would order CT angiogram.  MAB therapy can activate dormant CNS virea. Crohn's  disease.    The patient was advised of the nature of the diagnosed disorder , the treatment options and the  risks for  general health and wellness arising from not treating the condition.   I spent more than 50 minutes of face to face time with the patient. Greater than 50% of time was spent in counseling and coordination of care. We have discussed the diagnosis and differential and I answered the patient's questions.    Plan:  Treatment plan and additional workup :  CT angio- brain was normal.  We will start treatment with emgality o ajovy for migraines. Repeat orthostatic BP and heart rate.  As to Central vertigo - can not treat it. If it's seasonal may be helped by antihitamins.    Larey Seat, MD 4/85/4627, 0:35 PM  Certified in Neurology by ABPN Certified in [place] by [facility] [address], [place], [place] [address]

## 2018-11-23 NOTE — Telephone Encounter (Signed)
PA submitted through cover my meds/opt[employer] RX. KEY: [id number] Will wait to hear response

## 2018-11-24 NOTE — Telephone Encounter (Signed)
Received a notification that a PA was not needed since covered under the plan

## 2018-11-26 ENCOUNTER — Encounter: Payer: Self-pay | Admitting: *Deleted

## 2018-11-26 ENCOUNTER — Telehealth: Payer: Self-pay | Admitting: Neurology

## 2018-11-26 NOTE — Telephone Encounter (Signed)
Per last nursing note, Emgality PA not needed since it is covered by plan. I called [facility] and the staff stated that Emgality has been ready to pickup since 11/23/2018. They will text the patient to let her know.

## 2018-11-26 NOTE — Telephone Encounter (Signed)
Pt called to check on the update on her Galcanezumab-gnlm [facility]) 120 MG/ML SOAJ wants to know if the insurance will approve it or not. Please advise.

## 2018-11-26 NOTE — Telephone Encounter (Signed)
Sent pt a Pharmacist, community message with update.

## 2018-12-01 ENCOUNTER — Ambulatory Visit (INDEPENDENT_AMBULATORY_CARE_PROVIDER_SITE_OTHER): Payer: 59

## 2018-12-01 DIAGNOSIS — I469 Cardiac arrest, cause unspecified: Secondary | ICD-10-CM | POA: Diagnosis not present

## 2018-12-01 DIAGNOSIS — I5022 Chronic systolic (congestive) heart failure: Secondary | ICD-10-CM

## 2018-12-02 LAB — CUP PACEART REMOTE DEVICE CHECK
Battery Voltage: 2.93 V
Brady Statistic AP VP Percent: 1 %
Brady Statistic AS VP Percent: 1 %
Brady Statistic RA Percent Paced: 25 %
HighPow Impedance: 72 Ohm
HighPow Impedance: 72 Ohm
Implantable Lead Implant Date: 20151104
Implantable Lead Location: 753859
Implantable Lead Location: 753860
Implantable Lead Model: 5076
Lead Channel Impedance Value: 340 Ohm
Lead Channel Impedance Value: 430 Ohm
Lead Channel Pacing Threshold Amplitude: 0.75 V
Lead Channel Pacing Threshold Pulse Width: 0.4 ms
Lead Channel Sensing Intrinsic Amplitude: 10.8 mV
Lead Channel Setting Pacing Amplitude: 2 V
Lead Channel Setting Pacing Pulse Width: 0.4 ms
MDC IDC LEAD IMPLANT DT: 20151104
MDC IDC MSMT BATTERY REMAINING LONGEVITY: 55 mo
MDC IDC MSMT BATTERY REMAINING PERCENTAGE: 55 %
MDC IDC MSMT LEADCHNL RA PACING THRESHOLD AMPLITUDE: 0.5 V
MDC IDC MSMT LEADCHNL RA SENSING INTR AMPL: 4 mV
MDC IDC MSMT LEADCHNL RV PACING THRESHOLD PULSEWIDTH: 0.4 ms
MDC IDC PG IMPLANT DT: 20151104
MDC IDC PG SERIAL: 7179320
MDC IDC SESS DTM: 20200122131551
MDC IDC SET LEADCHNL RV PACING AMPLITUDE: 2.5 V
MDC IDC SET LEADCHNL RV SENSING SENSITIVITY: 0.5 mV
MDC IDC STAT BRADY AP VS PERCENT: 29 %
MDC IDC STAT BRADY AS VS PERCENT: 68 %
MDC IDC STAT BRADY RV PERCENT PACED: 1 %

## 2018-12-02 NOTE — Progress Notes (Signed)
Remote ICD transmission.   

## 2018-12-03 ENCOUNTER — Encounter: Payer: Self-pay | Admitting: Cardiology

## 2018-12-23 ENCOUNTER — Other Ambulatory Visit: Payer: Self-pay | Admitting: Nurse Practitioner

## 2019-03-02 ENCOUNTER — Ambulatory Visit (INDEPENDENT_AMBULATORY_CARE_PROVIDER_SITE_OTHER): Payer: 59 | Admitting: *Deleted

## 2019-03-02 ENCOUNTER — Other Ambulatory Visit: Payer: Self-pay

## 2019-03-02 DIAGNOSIS — I469 Cardiac arrest, cause unspecified: Secondary | ICD-10-CM

## 2019-03-02 LAB — CUP PACEART REMOTE DEVICE CHECK
Battery Remaining Longevity: 53 mo
Battery Remaining Percentage: 53 %
Battery Voltage: 2.92 V
Brady Statistic AP VP Percent: 1 %
Brady Statistic AP VS Percent: 35 %
Brady Statistic AS VP Percent: 1 %
Brady Statistic AS VS Percent: 62 %
Brady Statistic RA Percent Paced: 31 %
Brady Statistic RV Percent Paced: 1 %
Date Time Interrogation Session: 20200422060017
HighPow Impedance: 56 Ohm
HighPow Impedance: 56 Ohm
Implantable Lead Implant Date: 20151104
Implantable Lead Implant Date: 20151104
Implantable Lead Location: 753859
Implantable Lead Location: 753860
Implantable Lead Model: 5076
Implantable Pulse Generator Implant Date: 20151104
Lead Channel Impedance Value: 300 Ohm
Lead Channel Impedance Value: 360 Ohm
Lead Channel Pacing Threshold Amplitude: 0.5 V
Lead Channel Pacing Threshold Amplitude: 0.75 V
Lead Channel Pacing Threshold Pulse Width: 0.4 ms
Lead Channel Pacing Threshold Pulse Width: 0.4 ms
Lead Channel Sensing Intrinsic Amplitude: 3 mV
Lead Channel Sensing Intrinsic Amplitude: 6.2 mV
Lead Channel Setting Pacing Amplitude: 2 V
Lead Channel Setting Pacing Amplitude: 2.5 V
Lead Channel Setting Pacing Pulse Width: 0.4 ms
Lead Channel Setting Sensing Sensitivity: 0.5 mV
Pulse Gen Serial Number: 7179320

## 2019-03-10 ENCOUNTER — Encounter: Payer: Self-pay | Admitting: Cardiology

## 2019-03-10 NOTE — Progress Notes (Signed)
Remote ICD transmission.   

## 2019-05-24 ENCOUNTER — Other Ambulatory Visit: Payer: Self-pay

## 2019-05-24 ENCOUNTER — Ambulatory Visit: Payer: 59 | Admitting: Neurology

## 2019-06-01 ENCOUNTER — Ambulatory Visit (INDEPENDENT_AMBULATORY_CARE_PROVIDER_SITE_OTHER): Payer: 59 | Admitting: *Deleted

## 2019-06-01 DIAGNOSIS — I472 Ventricular tachycardia, unspecified: Secondary | ICD-10-CM

## 2019-06-01 DIAGNOSIS — I469 Cardiac arrest, cause unspecified: Secondary | ICD-10-CM

## 2019-06-01 LAB — CUP PACEART REMOTE DEVICE CHECK
Date Time Interrogation Session: 20200722123832
Implantable Lead Implant Date: 20151104
Implantable Lead Implant Date: 20151104
Implantable Lead Location: 753859
Implantable Lead Location: 753860
Implantable Lead Model: 5076
Implantable Pulse Generator Implant Date: 20151104
Pulse Gen Serial Number: 7179320

## 2019-06-16 NOTE — Progress Notes (Signed)
Remote ICD transmission.   

## 2019-07-05 ENCOUNTER — Encounter: Payer: Self-pay | Admitting: Adult Health

## 2019-07-05 ENCOUNTER — Ambulatory Visit: Payer: 59 | Admitting: Adult Health

## 2019-07-05 ENCOUNTER — Other Ambulatory Visit: Payer: Self-pay

## 2019-07-05 VITALS — BP 127/80 | HR 75 | Temp 98.4°F | Ht 65.0 in | Wt 175.0 lb

## 2019-07-05 DIAGNOSIS — R42 Dizziness and giddiness: Secondary | ICD-10-CM | POA: Diagnosis not present

## 2019-07-05 DIAGNOSIS — G43009 Migraine without aura, not intractable, without status migrainosus: Secondary | ICD-10-CM | POA: Diagnosis not present

## 2019-07-05 NOTE — Progress Notes (Signed)
PATIENT: Julie Saunders DOB: 1986/06/25  REASON FOR VISIT: follow up HISTORY FROM: patient  HISTORY OF PRESENT ILLNESS: Today 07/05/19:  Julie Saunders is a 33 year old female with a history of migraine headaches and vertigo.  She returns today for follow-up.  She states that her headaches have been controlled with Emgality.  She states that she may have 1-2 headaches a month.  She states that there is been some months that she did have a headache at all.  Her headache location varies.  She does report photophobia and phonophobia with her migraines.  She also has nausea but no vomiting.  Denies any visual changes.  Denies any numbness and weakness in the extremities.  She reports that she does use sumatriptan with good benefit.  She states that she still has vertigo.  This typically occurs with position changes.  She states that she will have some form of vertigo every day however the severity does vary.  She did complete vestibular rehab in the past.  She reports that she did get some benefit but not resolution.  She has not continue doing the exercises at home.  She states that she also was given Valium in the past and that was beneficial as well.  She returns today for an evaluation.  HISTORY 11-23-2018 (Copied from Dr.Dohmeier's note)  with patient and her father. She had gotten much better after our initial visit, but by Christmas time / new year had more spells, attributed this to the warm weather.  More migraines and more vertigo- any fast movements can trigger vertigo, any position changes.  Orthostatics were not done today. Vision is normal, bur left pupil is much larger than right.   REVIEW OF SYSTEMS: Out of a complete 14 system review of symptoms, the patient complains only of the following symptoms, and all other reviewed systems are negative. Dizziness, headache, moles, and abdominal pain, light sensitivity, fatigue, ringing in ears  ALLERGIES: Allergies  Allergen Reactions  .  Sulfa Antibiotics Rash  . Sulfacetamide Sodium Rash    HOME MEDICATIONS: Outpatient Medications Prior to Visit  Medication Sig Dispense Refill  . acetaminophen (TYLENOL) 325 MG tablet Take 650 mg by mouth every 6 (six) hours as needed for headache.    . budesonide (ENTOCORT EC) 3 MG 24 hr capsule Take 3 mg by mouth every morning. She is tapering off the medication jan 19th will be last dose  2  . calcium carbonate (CALCIUM 600) 600 MG TABS tablet Take 1,500 mg by mouth daily.    . cetirizine (ZYRTEC) 10 MG tablet Take 10 mg by mouth daily.    . Cholecalciferol (VITAMIN D3 PO) Take 50,000 Units by mouth once a week.    . Galcanezumab-gnlm (EMGALITY) 120 MG/ML SOAJ Inject 120 mg into the skin every 30 (thirty) days. 1 pen 11  . KLOR-CON M20 20 MEQ tablet TAKE 1 TABLET BY MOUTH EVERY DAY 90 tablet 2  . Pediatric Multiple Vit-C-FA (ANIMAL CHEWABLE MULTIVITAMIN PO) Take 1 tablet by mouth daily.    . SUMAtriptan (IMITREX) 100 MG tablet Take 100 mg by mouth every 2 (two) hours as needed for migraine. May repeat in 2 hours if headache persists or recurs.    . ustekinumab (STELARA) 90 MG/ML SOSY injection Use as directed    . famotidine (PEPCID) 20 MG tablet Take 20 mg by mouth daily.    . hyoscyamine (LEVSIN, ANASPAZ) 0.125 MG tablet Take 0.125 mg by mouth daily.    . rifaximin Doreene Nest)  550 MG TABS tablet Take 550 mg by mouth 2 (two) times daily.    . Cholecalciferol (VITAMIN D3) 2000 units capsule Take 1 capsule by mouth 2 (two) times daily.     Facility-Administered Medications Prior to Visit  Medication Dose Route Frequency Provider Last Rate Last Dose  . Galcanezumab-gnlm SOAJ 120 mg  120 mg Subcutaneous Once Dohmeier, Asencion Partridge, MD        PAST MEDICAL HISTORY: Past Medical History:  Diagnosis Date  . 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 Allergy   . Bradycardia   . Cardiac arrest ([place])  08/31/2014  . Crohn disease ([place])   . Depression   . History of blood transfusion 09/2012   "had allergic rxn to one of my Crohn's RX"   . Migraine    "a few times/yr" (06/08/2015)  . Pancytopenia ([place]) 11/23/2012  . Status post radiofrequency ablation for arrhythmia, atrial tach 06/08/15 06/08/2015  . SVT (supraventricular tachycardia) ([place]) 12/08/2014   likely an atrial tachycardia with CL 250 msec    PAST SURGICAL HISTORY: Past Surgical History:  Procedure Laterality Date  . ABLATION    . ELECTROPHYSIOLOGIC STUDY N/A 06/08/2015   Procedure: SVT Ablation;  Surgeon: Thompson Grayer, MD;  Location: [facility];  Service: Cardiovascular;  Laterality: N/A;  . ELECTROPHYSIOLOGY STUDY N/A 09/11/2014   Procedure: ELECTROPHYSIOLOGY STUDY;  Surgeon: Deboraha Sprang, MD;  Location: [facility] CATH LAB;  Service: Cardiovascular;  Laterality: N/A;  . IMPLANTABLE CARDIOVERTER DEFIBRILLATOR IMPLANT N/A 09/13/2014   Procedure: IMPLANTABLE CARDIOVERTER DEFIBRILLATOR IMPLANT;  Surgeon: Deboraha Sprang, MD;  Location: [facility] CATH LAB;  Service: Cardiovascular;  Laterality: N/A;; STJ dual chamber ICD implanted by Dr Caryl Comes for aborted cardiac arrest    FAMILY HISTORY: Family History  Problem Relation Age of Onset  . Heart attack Mother   . Migraines Mother   . Heart disease Mother   . Heart attack Maternal Grandmother   . Heart disease Maternal Grandmother   . Diabetes Maternal Grandfather   . Asthma Paternal Grandmother   . Diabetes Paternal Grandfather     SOCIAL HISTORY: Social History   Socioeconomic History  . Marital status: Single    Spouse name: Not on file  . Number of children: Not on file  . Years of education: Not on file  . Highest education level: Not on file  Occupational History  . Not on file  Social Needs  . Financial resource strain: Not on file  . Food insecurity    Worry: Not on file    Inability: Not on file  . Transportation needs    Medical: Not on file    Non-medical: Not  on file  Tobacco Use  . Smoking status: Never Smoker  . Smokeless tobacco: Never Used  Substance and Sexual Activity  . Alcohol use: Yes    Comment: occasional/social  . Drug use: No  . Sexual activity: Never  Lifestyle  . Physical activity    Days per week: Not on file    Minutes per session: Not on file  . Stress: Not on file  Relationships  . Social Herbalist on phone: Not on file    Gets together: Not on file    Attends religious service: Not on file    Active member of club or organization: Not on file    Attends meetings of clubs or organizations: Not on file  Relationship status: Not on file  . Intimate partner violence    Fear of current or ex partner: Not on file    Emotionally abused: Not on file    Physically abused: Not on file    Forced sexual activity: Not on file  Other Topics Concern  . Not on file  Social History Narrative  . Not on file      PHYSICAL EXAM  Vitals:   07/05/19 0826  BP: 127/80  Pulse: 75  Temp: 98.4 F (36.9 C)  TempSrc: Oral  Weight: 175 lb (79.4 kg)  Height: 5\' 5"  (1.651 m)   Body mass index is 29.12 kg/m.  Generalized: Well developed, in no acute distress   Neurological examination  Mentation: Alert oriented to time, place, history taking. Follows all commands speech and language fluent Cranial nerve II-XII:  Extraocular movements were full, visual field were full on confrontational test.  Head turning and shoulder shrug  were normal and symmetric. Motor: The motor testing reveals 5 over 5 strength of all 4 extremities. Good symmetric motor tone is noted throughout.  Sensory: Sensory testing is intact to soft touch on all 4 extremities. No evidence of extinction is noted.  Coordination: Cerebellar testing reveals good finger-nose-finger and heel-to-shin bilaterally.  Gait and station: Gait is normal.  Reflexes: Deep tendon reflexes are symmetric and normal bilaterally.   DIAGNOSTIC DATA (LABS, IMAGING,  TESTING) - I reviewed patient records, labs, notes, testing and imaging myself where available.  Lab Results  Component Value Date   WBC 10.5 09/22/2018   HGB 12.2 09/22/2018   HCT 37.1 09/22/2018   MCV 84 09/22/2018   PLT 402 09/22/2018      Component Value Date/Time   NA 142 09/22/2018 1532   NA 140 02/24/2013 1029   K 4.3 09/22/2018 1532   K 3.4 (L) 02/24/2013 1029   CL 104 09/22/2018 1532   CL 102 02/24/2013 1029   CO2 24 09/22/2018 1532   CO2 21 (L) 02/24/2013 1029   GLUCOSE 81 09/22/2018 1532   GLUCOSE 82 11/06/2016 1330   GLUCOSE 81 02/24/2013 1029   BUN 15 09/22/2018 1532   BUN 6.9 (L) 02/24/2013 1029   CREATININE 1.01 (H) 09/22/2018 1532   CREATININE 0.89 08/26/2016 1615   CREATININE 0.8 02/24/2013 1029   CALCIUM 9.0 09/22/2018 1532   CALCIUM 9.9 02/24/2013 1029   PROT 6.6 09/22/2018 1532   PROT 8.7 (H) 02/24/2013 1029   ALBUMIN 3.4 (L) 09/22/2018 1532   ALBUMIN 3.7 02/24/2013 1029   AST 9 09/22/2018 1532   AST 13 02/24/2013 1029   ALT 10 09/22/2018 1532   ALT 9 02/24/2013 1029   ALKPHOS 78 09/22/2018 1532   ALKPHOS 57 02/24/2013 1029   BILITOT <0.2 09/22/2018 1532   BILITOT 0.39 02/24/2013 1029   GFRNONAA 74 09/22/2018 1532   GFRAA 86 09/22/2018 1532    Lab Results  Component Value Date   TSH 3.772 08/21/2016      ASSESSMENT AND PLAN 33 y.o. year old female  has a past medical history of AICD (automatic cardioverter/defibrillator) present (09/14/2015), Allergy, Bradycardia, Cardiac arrest ([place]) (08/31/2014), Crohn disease ([place]), Depression, History of blood transfusion (09/2012), Migraine, Pancytopenia ([place]) (11/23/2012), Status post radiofrequency ablation for arrhythmia, atrial tach 06/08/15 (06/08/2015), and SVT (supraventricular tachycardia) ([place]) (12/08/2014). here with:  1.  Migraine headaches 2.  Vertigo  Overall the patient is doing well with her headaches.  She will continue on Emgality.  In regards to the vertigo.  This  seems to have a  positional component.  I encouraged her to start doing the exercises that she learned at vestibular rehab.  If this is not beneficial we may consider giving her a small dose of Valium to take for severe episodes.  She is advised that if her symptoms worsen or she develops new symptoms she should let us know.  She will follow-up in 6 months or sooner if needed   Ward Givens, MSN, NP-C 07/05/2019, 8:33 AM [facility] [address], [place], [place] [address] [phone]

## 2019-07-05 NOTE — Patient Instructions (Signed)
Your Plan:  Continue Emgality  Dizziness: do exercises learned at rehab If your symptoms worsen or you develop new symptoms please let us know.   Thank you for coming to see [place] at [facility]. I hope we have been able to provide you high quality care today.  You may receive a patient satisfaction survey over the next few weeks. We would appreciate your feedback and comments so that we may continue to improve ourselves and the health of our patients.

## 2019-09-01 ENCOUNTER — Ambulatory Visit (INDEPENDENT_AMBULATORY_CARE_PROVIDER_SITE_OTHER): Payer: 59 | Admitting: *Deleted

## 2019-09-01 DIAGNOSIS — I469 Cardiac arrest, cause unspecified: Secondary | ICD-10-CM

## 2019-09-01 DIAGNOSIS — I472 Ventricular tachycardia, unspecified: Secondary | ICD-10-CM

## 2019-09-01 LAB — CUP PACEART REMOTE DEVICE CHECK
Battery Remaining Longevity: 49 mo
Battery Remaining Percentage: 49 %
Battery Voltage: 2.92 V
Brady Statistic AP VP Percent: 1 %
Brady Statistic AP VS Percent: 38 %
Brady Statistic AS VP Percent: 1 %
Brady Statistic AS VS Percent: 59 %
Brady Statistic RA Percent Paced: 34 %
Brady Statistic RV Percent Paced: 1 %
Date Time Interrogation Session: 20201021060015
HighPow Impedance: 69 Ohm
HighPow Impedance: 69 Ohm
Implantable Lead Implant Date: 20151104
Implantable Lead Implant Date: 20151104
Implantable Lead Location: 753859
Implantable Lead Location: 753860
Implantable Lead Model: 5076
Implantable Pulse Generator Implant Date: 20151104
Lead Channel Impedance Value: 380 Ohm
Lead Channel Impedance Value: 440 Ohm
Lead Channel Pacing Threshold Amplitude: 0.5 V
Lead Channel Pacing Threshold Amplitude: 0.75 V
Lead Channel Pacing Threshold Pulse Width: 0.4 ms
Lead Channel Pacing Threshold Pulse Width: 0.4 ms
Lead Channel Sensing Intrinsic Amplitude: 12 mV
Lead Channel Sensing Intrinsic Amplitude: 5 mV
Lead Channel Setting Pacing Amplitude: 2 V
Lead Channel Setting Pacing Amplitude: 2.5 V
Lead Channel Setting Pacing Pulse Width: 0.4 ms
Lead Channel Setting Sensing Sensitivity: 0.5 mV
Pulse Gen Serial Number: 7179320

## 2019-09-08 IMAGING — CT CT ANGIO HEAD
1 of 5 series · 3 of 30 positions shown · IV contrast (APPLIED)
Comparison: CT HEAD August 31, 2014

CLINICAL DATA: Worsening vertigo. History of immunosuppression,
Crohn's disease, cardiac arrest and AICD.

EXAM:
CT ANGIOGRAPHY HEAD
TECHNIQUE: Multidetector CT imaging of the head was performed using the
standard protocol during bolus administration of intravenous
contrast. Multiplanar CT image reconstructions and MIPs were
obtained to evaluate the vascular anatomy.
CONTRAST:  75mL 0ILKMX-7VR IOPAMIDOL (0ILKMX-7VR) INJECTION 76%

[Series 8: head angio · axial · 0.42mm/px · z∈[+1094,+1178]mm · 3 of 58 slices shown]
[im 15/58  brain]
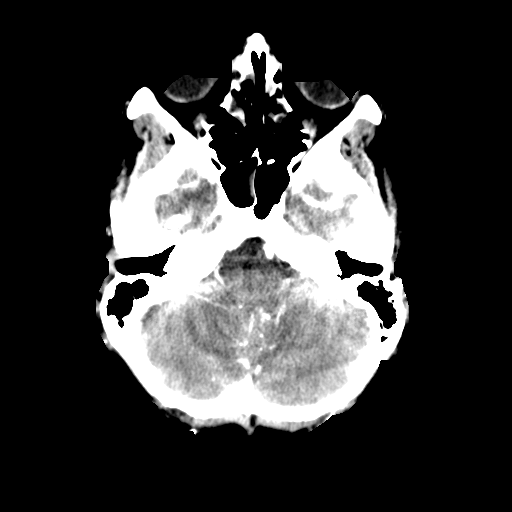
[im 29/58  bone]
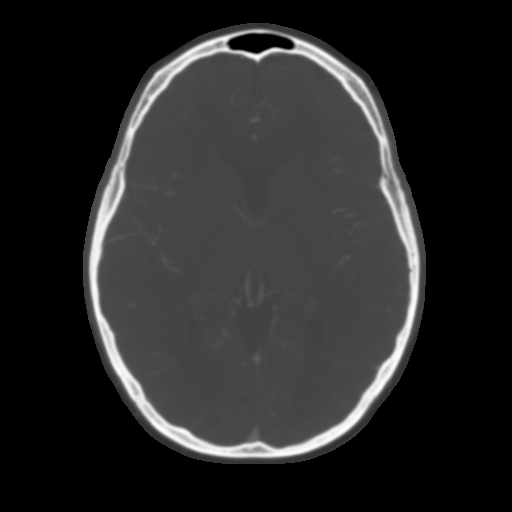
[im 43/58  brain]
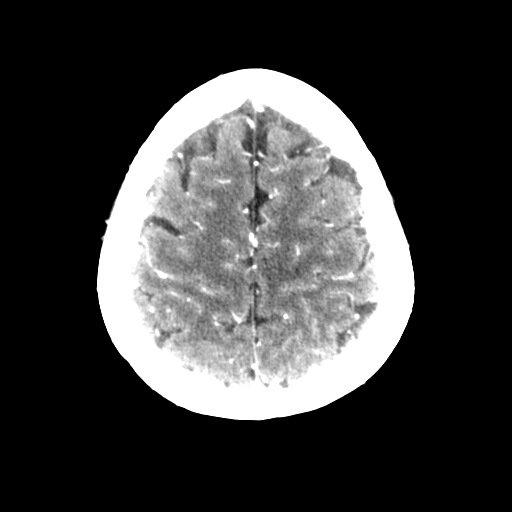

[3 of 30 positions shown; findings below may reference images not displayed]

FINDINGS: CT HEAD

BRAIN: No intraparenchymal hemorrhage, mass effect nor midline
shift. The ventricles and sulci are normal, cavum septum pellucidum.
No acute large vascular territory infarcts. No abnormal extra-axial
fluid collections. Basal cisterns are patent.

VASCULAR: Unremarkable.

SKULL/SOFT TISSUES: No skull fracture. No significant soft tissue
swelling.

ORBITS/SINUSES: The included ocular globes and orbital contents are
normal.Lobulated paranasal sinus mucosal thickening without
air-fluid levels. Mastoid air cells are well aerated.

OTHER: None.

CTA HEAD

ANTERIOR CIRCULATION: Patent cervical internal carotid arteries,
petrous, cavernous and supra clinoid internal carotid arteries.
Patent anterior communicating artery. Patent anterior and middle
cerebral arteries.

No large vessel occlusion, significant stenosis, contrast
extravasation or aneurysm.

POSTERIOR CIRCULATION: Patent vertebral arteries, vertebrobasilar
junction and basilar artery, as well as main branch vessels. RIGHT
vertebral artery is dominant. Patent posterior cerebral arteries.
Robust bilateral posterior communicating arteries present.

No large vessel occlusion, significant stenosis, contrast
extravasation or aneurysm.

VENOUS SINUSES: Major dural venous sinuses are patent though not
tailored for evaluation on this angiographic examination.

ANATOMIC VARIANTS: None.

DELAYED PHASE: No abnormal intracranial enhancement.

MIP images reviewed.
IMPRESSION: 1. Normal CT HEAD with and without contrast.
2. Normal CTA HEAD.

## 2019-09-13 NOTE — Progress Notes (Signed)
Remote ICD transmission.   

## 2019-09-26 NOTE — Progress Notes (Signed)
Patient Care Team: Alroy Dust, L.Marlou Sa, MD as PCP - General (Family Medicine)   HPI  Julie Saunders is a 33 y.o. female Seen in follow-up for aborted cardiac arrest.  No specific cause was identified. She was treated with beta blockers initially but these were stopped because of hypotension and dizziness. During cardiac rehabilitation she had an episode with ICD discharges.    She has subsequently undergone EPS RFCA  And these records were reveiewed>>ectopic tachycardia successful  Scant palps No chest pain edema nocturnal dyspnea.  Some dyspnea when is hot outside.  Also some dizziness occasionally positional some sensations of motion.  Following up with neurology.       DATE TEST EF   11/15 MYOVIEW 52% No ischemia/infarct  12/17 Echo 55-65%            Dizziness now for months, initially 24/7 now somewhat improved; undergoing neuro eval   Date Cr K Hgb TSH  1/16      1.053  10/17 1.04 2.8  1.69   10/18 1.01 4.0    8/19 1.08 3.8    6/20 1.13 4.5 12.3    Reviewed family history, mother died suddenly 2 months before her daughter's cardiac arrest.  Somehow this association was missed.  I reviewed her mother's home records, nothing really is informative.  She had a history of mitral valve prolapse apparently according to the daughter no other cardiac history.  No echo is available.            [place]   Past Medical History:  Diagnosis Date  . 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 Allergy   . Bradycardia   . Cardiac arrest ([place]) 08/31/2014  . Crohn disease ([place])   . Depression   . History of blood transfusion 09/2012   "had allergic rxn to one of my Crohn's RX"   . Migraine    "a few times/yr" (06/08/2015)  . Pancytopenia ([place]) 11/23/2012  . Status post radiofrequency ablation for arrhythmia, atrial tach 06/08/15 06/08/2015  . SVT (supraventricular tachycardia) ([place])  12/08/2014   likely an atrial tachycardia with CL 250 msec    Past Surgical History:  Procedure Laterality Date  . ABLATION    . ELECTROPHYSIOLOGIC STUDY N/A 06/08/2015   Procedure: SVT Ablation;  Surgeon: Thompson Grayer, MD;  Location: [place] [facility];  Service: Cardiovascular;  Laterality: N/A;  . ELECTROPHYSIOLOGY STUDY N/A 09/11/2014   Procedure: ELECTROPHYSIOLOGY STUDY;  Surgeon: Deboraha Sprang, MD;  Location: [facility] CATH LAB;  Service: Cardiovascular;  Laterality: N/A;  . IMPLANTABLE CARDIOVERTER DEFIBRILLATOR IMPLANT N/A 09/13/2014   Procedure: IMPLANTABLE CARDIOVERTER DEFIBRILLATOR IMPLANT;  Surgeon: Deboraha Sprang, MD;  Location: [facility] CATH LAB;  Service: Cardiovascular;  Laterality: N/A;; STJ dual chamber ICD implanted by Dr Caryl Comes for aborted cardiac arrest    Current Outpatient Medications  Medication Sig Dispense Refill  . acetaminophen (TYLENOL) 325 MG tablet Take 650 mg by mouth every 6 (six) hours as needed for headache.    . budesonide (ENTOCORT EC) 3 MG 24 hr capsule Take 6 mg by mouth daily.    . calcium carbonate (CALCIUM 600) 600 MG TABS tablet Take 1,500 mg by mouth daily.    . cetirizine (ZYRTEC) 10 MG tablet Take 10 mg by mouth daily.    . Cholecalciferol (VITAMIN D3 PO) Take 50,000 Units by mouth once a week.    Donah Driver (  EMGALITY) 120 MG/ML SOAJ Inject 120 mg into the skin every 30 (thirty) days. 1 pen 11  . hyoscyamine (LEVSIN, ANASPAZ) 0.125 MG tablet Take 0.125 mg by mouth daily.    Marland Kitchen KLOR-CON M20 20 MEQ tablet TAKE 1 TABLET BY MOUTH EVERY DAY 90 tablet 2  . Pediatric Multiple Vit-C-FA (ANIMAL CHEWABLE MULTIVITAMIN PO) Take 1 tablet by mouth daily.    . SUMAtriptan (IMITREX) 100 MG tablet Take 100 mg by mouth every 2 (two) hours as needed for migraine. May repeat in 2 hours if headache persists or recurs.    . ustekinumab (STELARA) 90 MG/ML SOSY injection Use as directed     Current Facility-Administered Medications  Medication Dose Route Frequency Provider  Last Rate Last Dose  . Galcanezumab-gnlm SOAJ 120 mg  120 mg Subcutaneous Once Dohmeier, Asencion Partridge, MD        Allergies  Allergen Reactions  . Sulfa Antibiotics Rash  . Sulfacetamide Sodium Rash    Review of Systems negative except from HPI and PMH  Physical Exam BP 124/80   Pulse 68   Ht 5' 5"  (1.651 m)   Wt 179 lb (81.2 kg)   SpO2 98%   BMI 29.79 kg/m  Well developed and well nourished in no acute distress HENT normal Neck supple with JVP-flat Clear Device pocket well healed; without hematoma or erythema.  There is no tethering  Regular rate and rhythm, no   murmur Abd-soft with active BS No Clubbing cyanosis   edema Skin-warm and dry A & Oriented  Grossly normal sensory and motor function  ECG sinus at 68 Intervals 13/10/41 Axis rightward at 94   Assessment and  Plan  Aborted cardiac arrest  ICD St Jude The patient's device was interrogated.  The information was reviewed. No changes were made in the programming.     Supraventricular tachycardia  status post RFCA-JA  Crohn's disease  Dizziness  Rightward axis new       No ventricular arrhythmias  Dizziness being addressed by nuero BP is much better than when orthostatics last done  With RAD now on ECG, will check echo to look for evidence of R H strain

## 2019-09-27 ENCOUNTER — Ambulatory Visit: Payer: 59 | Admitting: Internal Medicine

## 2019-09-27 ENCOUNTER — Other Ambulatory Visit: Payer: Self-pay

## 2019-09-27 ENCOUNTER — Encounter: Payer: Self-pay | Admitting: Internal Medicine

## 2019-09-27 VITALS — BP 124/80 | HR 68 | Ht 65.0 in | Wt 179.0 lb

## 2019-09-27 DIAGNOSIS — I469 Cardiac arrest, cause unspecified: Secondary | ICD-10-CM

## 2019-09-27 DIAGNOSIS — I4901 Ventricular fibrillation: Secondary | ICD-10-CM

## 2019-09-27 DIAGNOSIS — Z95810 Presence of automatic (implantable) cardiac defibrillator: Secondary | ICD-10-CM | POA: Diagnosis not present

## 2019-09-27 LAB — CUP PACEART INCLINIC DEVICE CHECK
Battery Remaining Longevity: 50 mo
Brady Statistic RA Percent Paced: 34 %
Brady Statistic RV Percent Paced: 0.19 %
Date Time Interrogation Session: 20201117184603
HighPow Impedance: 63 Ohm
Implantable Lead Implant Date: 20151104
Implantable Lead Implant Date: 20151104
Implantable Lead Location: 753859
Implantable Lead Location: 753860
Implantable Lead Model: 5076
Implantable Pulse Generator Implant Date: 20151104
Lead Channel Impedance Value: 325 Ohm
Lead Channel Impedance Value: 437.5 Ohm
Lead Channel Pacing Threshold Amplitude: 0.5 V
Lead Channel Pacing Threshold Amplitude: 0.75 V
Lead Channel Pacing Threshold Pulse Width: 0.4 ms
Lead Channel Pacing Threshold Pulse Width: 0.4 ms
Lead Channel Sensing Intrinsic Amplitude: 11.8 mV
Lead Channel Sensing Intrinsic Amplitude: 5 mV
Lead Channel Setting Pacing Amplitude: 2 V
Lead Channel Setting Pacing Amplitude: 2.5 V
Lead Channel Setting Pacing Pulse Width: 0.4 ms
Lead Channel Setting Sensing Sensitivity: 0.5 mV
Pulse Gen Serial Number: 7179320

## 2019-09-27 NOTE — Patient Instructions (Addendum)
Medication Instructions:   *If you need a refill on your cardiac medications before your next appointment, please call your pharmacy*  Lab Work:  If you have labs (blood work) drawn today and your tests are completely normal, you will receive your results only by: Marland Kitchen MyChart Message (if you have MyChart) OR . A paper copy in the mail If you have any lab test that is abnormal or we need to change your treatment, we will call you to review the results.  Testing/Procedures: Your physician has requested that you have an echocardiogram. Echocardiography is a painless test that uses sound waves to create images of your heart. It provides your doctor with information about the size and shape of your heart and how well your heart's chambers and valves are working. This procedure takes approximately one hour. There are no restrictions for this procedure.     Follow-Up: At [facility], you and your health needs are our priority.  As part of our continuing mission to provide you with exceptional heart care, we have created designated Provider Care Teams.  These Care Teams include your primary Cardiologist (physician) and Advanced Practice Providers (APPs -  Physician Assistants and Nurse Practitioners) who all work together to provide you with the care you need, when you need it.  Your next appointment:   1 year(s)  The format for your next appointment:   Either In Person or Virtual  Provider:   Virl Axe, MD  Other Instructions

## 2019-10-10 ENCOUNTER — Ambulatory Visit (HOSPITAL_COMMUNITY): Payer: 59 | Attending: Cardiology

## 2019-10-10 ENCOUNTER — Other Ambulatory Visit: Payer: Self-pay

## 2019-10-10 DIAGNOSIS — Z95810 Presence of automatic (implantable) cardiac defibrillator: Secondary | ICD-10-CM | POA: Diagnosis present

## 2019-10-10 DIAGNOSIS — I4901 Ventricular fibrillation: Secondary | ICD-10-CM | POA: Diagnosis present

## 2019-11-25 ENCOUNTER — Telehealth: Payer: Self-pay

## 2019-11-25 NOTE — Telephone Encounter (Signed)
The pt wanted to cancel her home remote appointment for 12-01-2019 because she is having issues with her insurance.

## 2019-12-23 ENCOUNTER — Other Ambulatory Visit: Payer: Self-pay | Admitting: Neurology

## 2019-12-23 ENCOUNTER — Encounter: Payer: Self-pay | Admitting: Neurology

## 2019-12-23 NOTE — Telephone Encounter (Signed)
This is only an Pharmacist, hospital for Julie Legacy, RN as I forwarded this response to the patient.  Good Morning, [name]. I am sorry to tell you that the only way EMGALITY is available is in these prefilled auto-injectors. I suppose the burning is not just located to the skin surface  but penetrates deeper into the tissue ? If more surface related, please try this : 1) put Lidocaine cream or gel at the location and let it work for 5 minutes. 2) Then place an ice cube at the same location, numbing the tissue deeper down,  3) After that area feel numb remove and clean the intended injection site and place the shot .  The injector is only for subcutaneus depth, I hope this works.

## 2020-01-05 ENCOUNTER — Ambulatory Visit: Payer: 59 | Admitting: Neurology

## 2020-01-05 ENCOUNTER — Encounter: Payer: Self-pay | Admitting: Neurology

## 2020-01-05 VITALS — BP 150/89 | HR 69 | Temp 98.4°F | Ht 65.0 in | Wt 176.0 lb

## 2020-01-05 DIAGNOSIS — R42 Dizziness and giddiness: Secondary | ICD-10-CM

## 2020-01-05 DIAGNOSIS — G43009 Migraine without aura, not intractable, without status migrainosus: Secondary | ICD-10-CM

## 2020-01-05 MED ORDER — SUMATRIPTAN SUCCINATE 100 MG PO TABS: 100.0000 mg | ORAL_TABLET | ORAL | 2 refills | Status: DC | PRN

## 2020-01-05 MED ORDER — EMGALITY 120 MG/ML ~~LOC~~ SOAJ: 120.0000 mg | SUBCUTANEOUS | 6 refills | Status: DC

## 2020-01-05 NOTE — Progress Notes (Signed)
PATIENT: Julie Saunders DOB: May 17, 1986  REASON FOR VISIT: follow up HISTORY FROM: patient  HISTORY OF PRESENT ILLNESS: Today 01/05/20: Here 34 y.o. year old female  has a past medical history of  Status post cardiac arrest.  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 here with:  1.  Migraine headaches 2.  Vertigo  Julie Saunders reports good control of headaches with a lesser frequency , slightly less intensity and may be slightly shorter duration. Here for refills of Emgality- she keept a headache journal.  Thee were several month since may without any headaches !     Julie Saunders is a 34 year old female with a history of migraine headaches and vertigo.  She returns today for follow-up.  She states that her headaches have been controlled with Emgality.  She states that she may have 1-2 headaches a month.  She states that there is been some months that she did have a headache at all.  Her headache location varies.  She does report photophobia and phonophobia with her migraines.  She also has nausea but no vomiting.  Denies any visual changes.  Denies any numbness and weakness in the extremities.  She reports that she does use sumatriptan with good benefit.  She states that she still has vertigo.  This typically occurs with position changes.  She states that she will have some form of vertigo every day however the severity does vary.  She did complete vestibular rehab in the past.  She reports that she did get some benefit but not resolution.  She has not continue doing the exercises at home.  She states that she also was given Valium in the past and that was  beneficial as well.  She returns today for an evaluation.  HISTORY 11-23-2018 (Copied from Dr.Haydn Hutsell's note)  meeting with patient and her father. She had gotten much better after our initial visit, but by Christmas time / new year had more spells, attributed this to the warm weather.  More migraines and more vertigo- any fast movements can trigger vertigo, any position changes.  Orthostatics were not done today. Vision is normal, bur left pupil is much larger than right.   REVIEW OF SYSTEMS: Out of a complete 14 system review of symptoms, the patient complains only of the following symptoms, and all other reviewed systems are negative. Dizziness, headache, moles, and abdominal pain, light sensitivity, fatigue, ringing in ears   Vertigo with sudden head turning and with rapid eye movement , and when bending down.  Today hypertensin.   Status post cardiac arrest.   All improved.   ALLERGIES: Allergies  Allergen Reactions  . Sulfa Antibiotics Rash  . Sulfacetamide Sodium Rash    HOME MEDICATIONS: Outpatient Medications Prior to Visit  Medication Sig Dispense Refill  . acetaminophen (TYLENOL) 325 MG tablet Take 650 mg by mouth every 6 (six) hours as needed for headache.    . budesonide (ENTOCORT EC) 3 MG 24 hr capsule Take 6 mg by mouth daily.    . calcium carbonate (CALCIUM 600) 600 MG TABS tablet Take 1,500 mg by mouth daily.    . cetirizine (ZYRTEC) 10 MG tablet Take 10 mg by mouth daily.    . Cholecalciferol (VITAMIN D3 PO) Take 50,000 Units by mouth once a week.    Marland Kitchen  EMGALITY 120 MG/ML SOAJ INJECT 120 MG INTO THE SKIN EVERY 30 (THIRTY) DAYS. 1 pen 6  . KLOR-CON M20 20 MEQ tablet TAKE 1 TABLET BY MOUTH EVERY DAY 90 tablet 2  . Pediatric Multiple Vit-C-FA (ANIMAL CHEWABLE MULTIVITAMIN PO) Take 1 tablet by mouth daily.    . SUMAtriptan (IMITREX) 100 MG tablet Take 100 mg by mouth every 2 (two) hours as needed for migraine. May repeat in 2 hours if headache persists or recurs.      . ustekinumab (STELARA) 90 MG/ML SOSY injection Use as directed    . hyoscyamine (LEVSIN, ANASPAZ) 0.125 MG tablet Take 0.125 mg by mouth daily.     Facility-Administered Medications Prior to Visit  Medication Dose Route Frequency Provider Last Rate Last Admin  . Galcanezumab-gnlm SOAJ 120 mg  120 mg Subcutaneous Once Valeria Boza, Asencion Partridge, MD        PAST MEDICAL HISTORY: Past Medical History:  Diagnosis Date  . 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 Allergy   . Bradycardia   . Cardiac arrest ([place]) 08/31/2014  . Crohn disease ([place])   . Depression   . History of blood transfusion 09/2012   "had allergic rxn to one of my Crohn's RX"   . Migraine    "a few times/yr" (06/08/2015)  . Pancytopenia ([place]) 11/23/2012  . Status post radiofrequency ablation for arrhythmia, atrial tach 06/08/15 06/08/2015  . SVT (supraventricular tachycardia) ([place]) 12/08/2014   likely an atrial tachycardia with CL 250 msec    PAST SURGICAL HISTORY: Past Surgical History:  Procedure Laterality Date  . ABLATION    . ELECTROPHYSIOLOGIC STUDY N/A 06/08/2015   Procedure: SVT Ablation;  Surgeon: Thompson Grayer, MD;  Location: [facility];  Service: Cardiovascular;  Laterality: N/A;  . ELECTROPHYSIOLOGY STUDY N/A 09/11/2014   Procedure: ELECTROPHYSIOLOGY STUDY;  Surgeon: Deboraha Sprang, MD;  Location[facility] CATH LAB;  Service: Cardiovascular;  Laterality: N/A;  . IMPLANTABLE CARDIOVERTER DEFIBRILLATOR IMPLANT N/A 09/13/2014   Procedure: IMPLANTABLE CARDIOVERTER DEFIBRILLATOR IMPLANT;  Surgeon: Deboraha Sprang, MD;  Location: [facility] CATH LAB;  Service: Cardiovascular;  Laterality: N/A;; STJ dual chamber ICD implanted by Dr Caryl Comes for aborted cardiac arrest    FAMILY HISTORY: Family History  Problem Relation Age of Onset  . Heart attack Mother   . Migraines Mother   . Heart disease Mother   . Heart attack Maternal Grandmother    . Heart disease Maternal Grandmother   . Diabetes Maternal Grandfather   . Asthma Paternal Grandmother   . Diabetes Paternal Grandfather     SOCIAL HISTORY: Social History   Socioeconomic History  . Marital status: Single    Spouse name: Not on file  . Number of children: Not on file  . Years of education: Not on file  . Highest education level: Not on file  Occupational History  . Not on file  Tobacco Use  . Smoking status: Never Smoker  . Smokeless tobacco: Never Used  Substance and Sexual Activity  . Alcohol use: Yes    Comment: occasional/social  . Drug use: No  . Sexual activity: Never  Other Topics Concern  . Not on file  Social History Narrative  . Not on file   Social Determinants of Health   Financial Resource Strain:   . Difficulty of Paying Living Expenses: Not on file  Food Insecurity:   . Worried About Charity fundraiser in the  Last Year: Not on file  . Ran Out of Food in the Last Year: Not on file  Transportation Needs:   . Lack of Transportation (Medical): Not on file  . Lack of Transportation (Non-Medical): Not on file  Physical Activity:   . Days of Exercise per Week: Not on file  . Minutes of Exercise per Session: Not on file  Stress:   . Feeling of Stress : Not on file  Social Connections:   . Frequency of Communication with Friends and Family: Not on file  . Frequency of Social Gatherings with Friends and Family: Not on file  . Attends Religious Services: Not on file  . Active Member of Clubs or Organizations: Not on file  . Attends Archivist Meetings: Not on file  . Marital Status: Not on file  Intimate Partner Violence:   . Fear of Current or Ex-Partner: Not on file  . Emotionally Abused: Not on file  . Physically Abused: Not on file  . Sexually Abused: Not on file      PHYSICAL EXAM  Vitals:   01/05/20 1450  BP: (!) 150/89  Pulse: 69  Temp: 98.4 F (36.9 C)  Weight: 176 lb (79.8 kg)  Height: 5' 5"  (1.651 m)    Body mass index is 29.29 kg/m.  Generalized: Well developed, in no acute distress   Neurological examination  Mentation: Alert oriented to time, place, history taking. Follows all commands speech and language fluent Cranial nerve :  No changes in taste or smell, Extraocular movements were full, she has significantly larger left than right pupil-  visual field were full on confrontational test.   Head turning and shoulder shrug  were of normal ROM but caused vertigo- and symmetric. Motor: full level 5 strength of all extremities. Sensory: Sensory testing is intact to soft touch on all 4 extremities. No evidence of extinction is noted.  Coordination: Cerebellar testing bilaterally intact.  Gait and station: Gait is stabilized. No drift.   Reflexes: Deep tendon reflexes are symmetric bilaterally.   DIAGNOSTIC DATA (LABS, IMAGING, TESTING) - I reviewed patient records, labs, notes, testing and imaging myself where available.   ASSESSMENT AND PLAN:  34 year old caucasian left- handed female with a remarkable history of cardiac arrest and resuscitation, implanted defibrillator. No report of defibrillation activity since 2016.   Overall the patient is doing well with her migraines which have decreased in frequency -  She will continue on Emgality.   In regards to the vertigo.  This seems to have a positional component.  I encouraged her to start doing the exercises that she learned at vestibular rehab.   If this is not beneficial we may consider giving her a small dose of Valium to take for severe episodes.    She is advised that if her symptoms worsen or she develops new symptoms she should let us know.  She will follow-up in 6 months with Np or sooner if needed   Larey Seat, MD  01/05/2020, 3:25 PM [facility] [address] [place], [place] [address] [phone]

## 2020-01-05 NOTE — Patient Instructions (Signed)
Galcanezumab injection What is this medicine? GALCANEZUMAB (gal ka NEZ ue mab) is used to prevent migraines and treat cluster headaches. This medicine may be used for other purposes; ask your health care provider or pharmacist if you have questions. COMMON BRAND NAME(S): Emgality What should I tell my health care provider before I take this medicine? They need to know if you have any of these conditions:  an unusual or allergic reaction to galcanezumab, other medicines, foods, dyes, or preservatives  pregnant or trying to get pregnant  breast-feeding How should I use this medicine? This medicine is for injection under the skin. You will be taught how to prepare and give this medicine. Use exactly as directed. Take your medicine at regular intervals. Do not take your medicine more often than directed. It is important that you put your used needles and syringes in a special sharps container. Do not put them in a trash can. If you do not have a sharps container, call your pharmacist or healthcare provider to get one. Talk to your pediatrician regarding the use of this medicine in children. Special care may be needed. Overdosage: If you think you have taken too much of this medicine contact a poison control center or emergency room at once. NOTE: This medicine is only for you. Do not share this medicine with others. What if I miss a dose? If you miss a dose, take it as soon as you can. If it is almost time for your next dose, take only that dose. Do not take double or extra doses. What may interact with this medicine? Interactions are not expected. This list may not describe all possible interactions. Give your health care provider a list of all the medicines, herbs, non-prescription drugs, or dietary supplements you use. Also tell them if you smoke, drink alcohol, or use illegal drugs. Some items may interact with your medicine. What should I watch for while using this medicine? Tell your  doctor or healthcare professional if your symptoms do not start to get better or if they get worse. What side effects may I notice from receiving this medicine? Side effects that you should report to your doctor or health care professional as soon as possible:  allergic reactions like skin rash, itching or hives, swelling of the face, lips, or tongue Side effects that usually do not require medical attention (report these to your doctor or health care professional if they continue or are bothersome):  pain, redness, or irritation at site where injected This list may not describe all possible side effects. Call your doctor for medical advice about side effects. You may report side effects to FDA at 1-800-FDA-1088. Where should I keep my medicine? Keep out of the reach of children. You will be instructed on how to store this medicine. Throw away any unused medicine after the expiration date on the label. NOTE: This sheet is a summary. It may not cover all possible information. If you have questions about this medicine, talk to your doctor, pharmacist, or health care provider.  2020 Elsevier/Gold Standard (2018-04-14 12:03:23)

## 2020-01-09 ENCOUNTER — Other Ambulatory Visit: Payer: Self-pay | Admitting: Neurology

## 2020-01-09 MED ORDER — EMGALITY 120 MG/ML ~~LOC~~ SOAJ: 120.0000 mg | SUBCUTANEOUS | 6 refills | Status: DC

## 2020-03-01 ENCOUNTER — Ambulatory Visit (INDEPENDENT_AMBULATORY_CARE_PROVIDER_SITE_OTHER): Payer: 59 | Admitting: *Deleted

## 2020-03-01 DIAGNOSIS — I469 Cardiac arrest, cause unspecified: Secondary | ICD-10-CM | POA: Diagnosis not present

## 2020-03-01 LAB — CUP PACEART REMOTE DEVICE CHECK
Battery Remaining Longevity: 44 mo
Battery Remaining Percentage: 44 %
Battery Voltage: 2.9 V
Brady Statistic AP VP Percent: 1 %
Brady Statistic AP VS Percent: 42 %
Brady Statistic AS VP Percent: 1 %
Brady Statistic AS VS Percent: 57 %
Brady Statistic RA Percent Paced: 38 %
Brady Statistic RV Percent Paced: 1 %
Date Time Interrogation Session: 20210422020015
HighPow Impedance: 72 Ohm
HighPow Impedance: 72 Ohm
Implantable Lead Implant Date: 20151104
Implantable Lead Implant Date: 20151104
Implantable Lead Location: 753859
Implantable Lead Location: 753860
Implantable Lead Model: 5076
Implantable Pulse Generator Implant Date: 20151104
Lead Channel Impedance Value: 350 Ohm
Lead Channel Impedance Value: 490 Ohm
Lead Channel Pacing Threshold Amplitude: 0.5 V
Lead Channel Pacing Threshold Amplitude: 0.75 V
Lead Channel Pacing Threshold Pulse Width: 0.4 ms
Lead Channel Pacing Threshold Pulse Width: 0.4 ms
Lead Channel Sensing Intrinsic Amplitude: 11.2 mV
Lead Channel Sensing Intrinsic Amplitude: 4.1 mV
Lead Channel Setting Pacing Amplitude: 2 V
Lead Channel Setting Pacing Amplitude: 2.5 V
Lead Channel Setting Pacing Pulse Width: 0.4 ms
Lead Channel Setting Sensing Sensitivity: 0.5 mV
Pulse Gen Serial Number: 7179320

## 2020-03-02 NOTE — Progress Notes (Signed)
ICD Remote  

## 2020-05-31 ENCOUNTER — Ambulatory Visit (INDEPENDENT_AMBULATORY_CARE_PROVIDER_SITE_OTHER): Payer: 59 | Admitting: *Deleted

## 2020-05-31 DIAGNOSIS — I469 Cardiac arrest, cause unspecified: Secondary | ICD-10-CM

## 2020-06-01 LAB — CUP PACEART REMOTE DEVICE CHECK
Battery Remaining Longevity: 42 mo
Battery Remaining Percentage: 42 %
Battery Voltage: 2.89 V
Brady Statistic AP VP Percent: 1 %
Brady Statistic AP VS Percent: 39 %
Brady Statistic AS VP Percent: 1 %
Brady Statistic AS VS Percent: 60 %
Brady Statistic RA Percent Paced: 36 %
Brady Statistic RV Percent Paced: 1 %
Date Time Interrogation Session: 20210722020015
HighPow Impedance: 97 Ohm
HighPow Impedance: 97 Ohm
Implantable Lead Implant Date: 20151104
Implantable Lead Implant Date: 20151104
Implantable Lead Location: 753859
Implantable Lead Location: 753860
Implantable Lead Model: 5076
Implantable Pulse Generator Implant Date: 20151104
Lead Channel Impedance Value: 300 Ohm
Lead Channel Impedance Value: 440 Ohm
Lead Channel Pacing Threshold Amplitude: 0.5 V
Lead Channel Pacing Threshold Amplitude: 0.75 V
Lead Channel Pacing Threshold Pulse Width: 0.4 ms
Lead Channel Pacing Threshold Pulse Width: 0.4 ms
Lead Channel Sensing Intrinsic Amplitude: 3.1 mV
Lead Channel Sensing Intrinsic Amplitude: 8.2 mV
Lead Channel Setting Pacing Amplitude: 2 V
Lead Channel Setting Pacing Amplitude: 2.5 V
Lead Channel Setting Pacing Pulse Width: 0.4 ms
Lead Channel Setting Sensing Sensitivity: 0.5 mV
Pulse Gen Serial Number: 7179320

## 2020-06-04 NOTE — Progress Notes (Signed)
Remote ICD transmission.   

## 2020-07-11 ENCOUNTER — Ambulatory Visit: Payer: 59 | Admitting: Adult Health

## 2020-07-12 ENCOUNTER — Other Ambulatory Visit: Payer: Self-pay

## 2020-07-12 ENCOUNTER — Encounter: Payer: Self-pay | Admitting: Adult Health

## 2020-07-12 ENCOUNTER — Ambulatory Visit: Payer: 59 | Admitting: Adult Health

## 2020-07-12 VITALS — BP 136/96 | HR 87 | Ht 65.0 in | Wt 178.6 lb

## 2020-07-12 DIAGNOSIS — G43009 Migraine without aura, not intractable, without status migrainosus: Secondary | ICD-10-CM | POA: Diagnosis not present

## 2020-07-12 DIAGNOSIS — R42 Dizziness and giddiness: Secondary | ICD-10-CM | POA: Diagnosis not present

## 2020-07-12 MED ORDER — DIAZEPAM 5 MG PO TABS: 5.0000 mg | ORAL_TABLET | Freq: Two times a day (BID) | ORAL | 0 refills | Status: DC | PRN

## 2020-07-12 MED ORDER — EMGALITY 120 MG/ML ~~LOC~~ SOAJ: 120.0000 mg | SUBCUTANEOUS | 11 refills | Status: DC

## 2020-07-12 NOTE — Patient Instructions (Signed)
Your Plan:  Continue Emgality  Valium ordered for vertigo that does not resolve Do vestibular exercises If your symptoms worsen or you develop new symptoms please let us know.     Thank you for coming to see [place] at [facility]. I hope we have been able to provide you high quality care today.  You may receive a patient satisfaction survey over the next few weeks. We would appreciate your feedback and comments so that we may continue to improve ourselves and the health of our patients.

## 2020-07-12 NOTE — Progress Notes (Signed)
PATIENT: Julie Saunders DOB: 1986-05-17  REASON FOR VISIT: follow up HISTORY FROM: patient  HISTORY OF PRESENT ILLNESS: Today 07/12/20:  Julie Saunders is a 34 year old with a history of migraine headaches and vertigo.  She returns today for she states that she has not had a headache since December 2020.  Reports that Julie Saunders has been extremely beneficial for her.  She reports that her vertigo episode started to increase last month.  In the past she had a prescription of Valium from Julie Saunders.  She states that she did take a Valium and that was beneficial.  She is continue to try to do the vestibular exercises she learned in vestibular rehab however admits that she has not been consistent with these exercises.  She returns today for an evaluation.  HISTORY Julie Saunders reports good control of headaches with a lesser frequency , slightly less intensity and may be slightly shorter duration. Here for refills of Julie Saunders- she keept a headache journal.  Thee were several month since may without any headaches !     Julie Saunders is a 34 year old female with a history of migraine headaches and vertigo.  She returns today for follow-up.  She states that her headaches have been controlled with Julie Saunders.  She states that she may have 1-2 headaches a month.  She states that there is been some months that she did have a headache at all.  Her headache location varies.  She does report photophobia and phonophobia with her migraines.  She also has nausea but no vomiting.  Denies any visual changes.  Denies any numbness and weakness in the extremities.  She reports that she does use sumatriptan with good benefit.  She states that she still has vertigo.  This typically occurs with position changes.  She states that she will have some form of vertigo every day however the severity does vary.  She did complete vestibular rehab in the past.  She reports that she did get some benefit but not resolution.  She has not  continue doing the exercises at home.  She states that she also was given Valium in the past and that was beneficial as well.  She returns today for an evaluation.  REVIEW OF SYSTEMS: Out of a complete 14 system review of symptoms, the patient complains only of the following symptoms, and all other reviewed systems are negative.  See HPI  ALLERGIES: Allergies  Allergen Reactions  . Sulfa Antibiotics Rash  . Sulfacetamide Sodium Rash    HOME MEDICATIONS: Outpatient Medications Prior to Visit  Medication Sig Dispense Refill  . acetaminophen (TYLENOL) 325 MG tablet Take 650 mg by mouth every 6 (six) hours as needed for headache.    . budesonide (ENTOCORT EC) 3 MG 24 hr capsule Take 6 mg by mouth daily.    . calcium carbonate (CALCIUM 600) 600 MG TABS tablet Take 1,500 mg by mouth daily.    . cetirizine (ZYRTEC) 10 MG tablet Take 10 mg by mouth daily.    . Cholecalciferol (VITAMIN D3 PO) Take 50,000 Units by mouth once a week.    . Cholecalciferol (VITAMIN D3) 1.25 MG (50000 UT) CAPS Take 1 capsule by mouth once a week.    . Galcanezumab-gnlm (Julie Saunders) 120 MG/ML SOAJ Inject 120 mg into the skin every 30 (thirty) days. 1 pen 6  . KLOR-CON M20 20 MEQ tablet TAKE 1 TABLET BY MOUTH EVERY DAY 90 tablet 2  . Pediatric Multiple Vit-C-FA (ANIMAL CHEWABLE MULTIVITAMIN  PO) Take 1 tablet by mouth daily.    . SUMAtriptan (IMITREX) 100 MG tablet Take 1 tablet (100 mg total) by mouth every 2 (two) hours as needed for migraine. May repeat in 2 hours if headache persists or recurs. 10 tablet 2  . thiamine 100 MG tablet Take by mouth.    . ustekinumab (STELARA) 90 MG/ML SOSY injection Use as directed     No facility-administered medications prior to visit.    PAST MEDICAL HISTORY: Past Medical History:  Diagnosis Date  . 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 Allergy   . Bradycardia   . Cardiac arrest  ([place]) 08/31/2014  . Crohn disease ([place])   . Depression   . History of blood transfusion 09/2012   "had allergic rxn to one of my Crohn's RX"   . Migraine    "a few times/yr" (06/08/2015)  . Pancytopenia ([place]) 11/23/2012  . Status post radiofrequency ablation for arrhythmia, atrial tach 06/08/15 06/08/2015  . SVT (supraventricular tachycardia) ([place]) 12/08/2014   likely an atrial tachycardia with CL 250 msec    PAST SURGICAL HISTORY: Past Surgical History:  Procedure Laterality Date  . ABLATION    . ELECTROPHYSIOLOGIC STUDY N/A 06/08/2015   Procedure: SVT Ablation;  Surgeon: Thompson Grayer, MD;  Location: [facility];  Service: Cardiovascular;  Laterality: N/A;  . ELECTROPHYSIOLOGY STUDY N/A 09/11/2014   Procedure: ELECTROPHYSIOLOGY STUDY;  Surgeon: Deboraha Sprang, MD;  Location: [facility] CATH LAB;  Service: Cardiovascular;  Laterality: N/A;  . IMPLANTABLE CARDIOVERTER DEFIBRILLATOR IMPLANT N/A 09/13/2014   Procedure: IMPLANTABLE CARDIOVERTER DEFIBRILLATOR IMPLANT;  Surgeon: Deboraha Sprang, MD;  Location: [facility] CATH LAB;  Service: Cardiovascular;  Laterality: N/A;; STJ dual chamber ICD implanted by Dr Caryl Comes for aborted cardiac arrest    FAMILY HISTORY: Family History  Problem Relation Age of Onset  . Heart attack Mother   . Migraines Mother   . Heart disease Mother   . Heart attack Maternal Grandmother   . Heart disease Maternal Grandmother   . Diabetes Maternal Grandfather   . Asthma Paternal Grandmother   . Diabetes Paternal Grandfather     SOCIAL HISTORY: Social History   Socioeconomic History  . Marital status: Single    Spouse name: Not on file  . Number of children: Not on file  . Years of education: Not on file  . Highest education level: Not on file  Occupational History  . Not on file  Tobacco Use  . Smoking status: Never Smoker  . Smokeless tobacco: Never Used  Substance and Sexual Activity  . Alcohol use: Yes    Comment: occasional/social  . Drug use: No  . Sexual  activity: Never  Other Topics Concern  . Not on file  Social History Narrative  . Not on file   Social Determinants of Health   Financial Resource Strain:   . Difficulty of Paying Living Expenses: Not on file  Food Insecurity:   . Worried About Charity fundraiser in the Last Year: Not on file  . Ran Out of Food in the Last Year: Not on file  Transportation Needs:   . Lack of Transportation (Medical): Not on file  . Lack of Transportation (Non-Medical): Not on file  Physical Activity:   . Days of Exercise per Week: Not on file  . Minutes of Exercise per Session: Not on file  Stress:   . Feeling of  Stress : Not on file  Social Connections:   . Frequency of Communication with Friends and Family: Not on file  . Frequency of Social Gatherings with Friends and Family: Not on file  . Attends Religious Services: Not on file  . Active Member of Clubs or Organizations: Not on file  . Attends Archivist Meetings: Not on file  . Marital Status: Not on file  Intimate Partner Violence:   . Fear of Current or Ex-Partner: Not on file  . Emotionally Abused: Not on file  . Physically Abused: Not on file  . Sexually Abused: Not on file      PHYSICAL EXAM  Vitals:   07/12/20 1423  BP: (!) 136/96  Pulse: 87  Weight: 178 lb 9.6 oz (81 kg)  Height: 5' 5"  (1.651 m)   Body mass index is 29.72 kg/m.  Generalized: Well developed, in no acute distress   Neurological examination  Mentation: Alert oriented to time, place, history taking. Follows all commands speech and language fluent Cranial nerve II-XII: Pupils were equal round reactive to light. Extraocular movements were full, visual field were full on confrontational test. . Head turning and shoulder shrug  were normal and symmetric. Motor: The motor testing reveals 5 over 5 strength of all 4 extremities. Good symmetric motor tone is noted throughout.  Sensory: Sensory testing is intact to soft touch on all 4 extremities. No  evidence of extinction is noted.  Coordination: Cerebellar testing reveals good finger-nose-finger and heel-to-shin bilaterally.  Gait and station: Gait is normal.  Reflexes: Deep tendon reflexes are symmetric and normal bilaterally.   DIAGNOSTIC DATA (LABS, IMAGING, TESTING) - I reviewed patient records, labs, notes, testing and imaging myself where available.  Lab Results  Component Value Date   WBC 10.5 09/22/2018   HGB 12.2 09/22/2018   HCT 37.1 09/22/2018   MCV 84 09/22/2018   PLT 402 09/22/2018      Component Value Date/Time   NA 142 09/22/2018 1532   NA 140 02/24/2013 1029   K 4.3 09/22/2018 1532   K 3.4 (L) 02/24/2013 1029   CL 104 09/22/2018 1532   CL 102 02/24/2013 1029   CO2 24 09/22/2018 1532   CO2 21 (L) 02/24/2013 1029   GLUCOSE 81 09/22/2018 1532   GLUCOSE 82 11/06/2016 1330   GLUCOSE 81 02/24/2013 1029   BUN 15 09/22/2018 1532   BUN 6.9 (L) 02/24/2013 1029   CREATININE 1.01 (H) 09/22/2018 1532   CREATININE 0.89 08/26/2016 1615   CREATININE 0.8 02/24/2013 1029   CALCIUM 9.0 09/22/2018 1532   CALCIUM 9.9 02/24/2013 1029   PROT 6.6 09/22/2018 1532   PROT 8.7 (H) 02/24/2013 1029   ALBUMIN 3.4 (L) 09/22/2018 1532   ALBUMIN 3.7 02/24/2013 1029   AST 9 09/22/2018 1532   AST 13 02/24/2013 1029   ALT 10 09/22/2018 1532   ALT 9 02/24/2013 1029   ALKPHOS 78 09/22/2018 1532   ALKPHOS 57 02/24/2013 1029   BILITOT <0.2 09/22/2018 1532   BILITOT 0.39 02/24/2013 1029   GFRNONAA 74 09/22/2018 1532   GFRAA 86 09/22/2018 1532   No results found for: CHOL, HDL, LDLCALC, LDLDIRECT, TRIG, CHOLHDL No results found for: HGBA1C No results found for: VITAMINB12 Lab Results  Component Value Date   TSH 3.772 08/21/2016      ASSESSMENT AND PLAN 34 y.o. 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 here with:  Migraine headaches   Continue Julie Saunders  Advised to let us know if her headache frequency increases  Vertigo   I have provided her with a small dose of Valium ( 5 mg- 10 tablets) to use for prolonged episode of vertigo.  She was also encouraged to do vestibular exercises on a daily basis to prevent vertigo.  She will follow-up in 6 months or sooner if needed  I spent 25 minutes of face-to-face and non-face-to-face time with patient.  This included previsit chart review, lab review, study review, order entry, electronic health record documentation, patient education.  Ward Givens, MSN, NP-C 07/12/2020, 2:40 PM [facility] [address], [place], [place] [address] [phone]

## 2020-08-30 ENCOUNTER — Ambulatory Visit (INDEPENDENT_AMBULATORY_CARE_PROVIDER_SITE_OTHER): Payer: 59

## 2020-08-30 DIAGNOSIS — I469 Cardiac arrest, cause unspecified: Secondary | ICD-10-CM

## 2020-08-30 LAB — CUP PACEART REMOTE DEVICE CHECK
Battery Remaining Longevity: 39 mo
Battery Remaining Longevity: 39 mo
Battery Remaining Percentage: 39 %
Battery Remaining Percentage: 39 %
Battery Voltage: 2.87 V
Battery Voltage: 2.87 V
Brady Statistic AP VP Percent: 1 %
Brady Statistic AP VP Percent: 1 %
Brady Statistic AP VS Percent: 39 %
Brady Statistic AP VS Percent: 39 %
Brady Statistic AS VP Percent: 1 %
Brady Statistic AS VP Percent: 1 %
Brady Statistic AS VS Percent: 60 %
Brady Statistic AS VS Percent: 60 %
Brady Statistic RA Percent Paced: 36 %
Brady Statistic RA Percent Paced: 36 %
Brady Statistic RV Percent Paced: 1 %
Brady Statistic RV Percent Paced: 1 %
Date Time Interrogation Session: 20211021020015
Date Time Interrogation Session: 20211021084712
HighPow Impedance: 77 Ohm
HighPow Impedance: 77 Ohm
HighPow Impedance: 63 Ohm
HighPow Impedance: 77 Ohm
Implantable Lead Implant Date: 20151104
Implantable Lead Implant Date: 20151104
Implantable Lead Implant Date: 20151104
Implantable Lead Implant Date: 20151104
Implantable Lead Location: 753859
Implantable Lead Location: 753860
Implantable Lead Location: 753859
Implantable Lead Location: 753860
Implantable Lead Model: 5076
Implantable Lead Model: 5076
Implantable Pulse Generator Implant Date: 20151104
Implantable Pulse Generator Implant Date: 20151104
Lead Channel Impedance Value: 310 Ohm
Lead Channel Impedance Value: 410 Ohm
Lead Channel Impedance Value: 310 Ohm
Lead Channel Impedance Value: 410 Ohm
Lead Channel Pacing Threshold Amplitude: 0.5 V
Lead Channel Pacing Threshold Amplitude: 0.75 V
Lead Channel Pacing Threshold Amplitude: 0.5 V
Lead Channel Pacing Threshold Amplitude: 0.75 V
Lead Channel Pacing Threshold Pulse Width: 0.4 ms
Lead Channel Pacing Threshold Pulse Width: 0.4 ms
Lead Channel Pacing Threshold Pulse Width: 0.4 ms
Lead Channel Pacing Threshold Pulse Width: 0.4 ms
Lead Channel Sensing Intrinsic Amplitude: 3.5 mV
Lead Channel Sensing Intrinsic Amplitude: 7.1 mV
Lead Channel Sensing Intrinsic Amplitude: 3.5 mV
Lead Channel Sensing Intrinsic Amplitude: 7.1 mV
Lead Channel Setting Pacing Amplitude: 2 V
Lead Channel Setting Pacing Amplitude: 2.5 V
Lead Channel Setting Pacing Amplitude: 2 V
Lead Channel Setting Pacing Amplitude: 2.5 V
Lead Channel Setting Pacing Pulse Width: 0.4 ms
Lead Channel Setting Pacing Pulse Width: 0.4 ms
Lead Channel Setting Sensing Sensitivity: 0.5 mV
Lead Channel Setting Sensing Sensitivity: 0.5 mV
Pulse Gen Serial Number: 7179320
Pulse Gen Serial Number: 7179320

## 2020-09-05 NOTE — Progress Notes (Signed)
Remote ICD transmission.   

## 2020-10-11 ENCOUNTER — Telehealth (INDEPENDENT_AMBULATORY_CARE_PROVIDER_SITE_OTHER): Payer: 59 | Admitting: Internal Medicine

## 2020-10-11 ENCOUNTER — Other Ambulatory Visit: Payer: Self-pay

## 2020-10-11 ENCOUNTER — Encounter: Payer: Self-pay | Admitting: Internal Medicine

## 2020-10-11 VITALS — BP 124/88 | HR 71 | Ht 65.0 in | Wt 178.0 lb

## 2020-10-11 DIAGNOSIS — I469 Cardiac arrest, cause unspecified: Secondary | ICD-10-CM | POA: Diagnosis not present

## 2020-10-11 DIAGNOSIS — I471 Supraventricular tachycardia: Secondary | ICD-10-CM | POA: Diagnosis not present

## 2020-10-11 DIAGNOSIS — Z95810 Presence of automatic (implantable) cardiac defibrillator: Secondary | ICD-10-CM

## 2020-10-11 DIAGNOSIS — I429 Cardiomyopathy, unspecified: Secondary | ICD-10-CM | POA: Diagnosis not present

## 2020-10-11 MED ORDER — POTASSIUM CHLORIDE CRYS ER 20 MEQ PO TBCR
20.0000 meq | EXTENDED_RELEASE_TABLET | Freq: Every day | ORAL | 3 refills | Status: DC
Start: 2020-10-11 — End: 2021-11-14

## 2020-10-11 NOTE — Progress Notes (Signed)
Electrophysiology TeleHealth Note   Due to national recommendations of social distancing due to COVID 19, an audio/video telehealth visit is felt to be most appropriate for this patient at this time.  See MyChart message from today for the patient's consent to telehealth for [facility].   Date:  10/11/2020   ID:  Julie Saunders, DOB 11-28-1985, MRN [id number]  Location: patient's home  Provider location: [address] [place]  Evaluation Performed: Follow-up visit  PCP:  Alroy Dust, L.Marlou Sa, MD  Cardiologist:     Electrophysiologist:  SK   Chief Complaint: aborted cardiac arrest*  History of Present Illness:    Julie Saunders is a 34 y.o. female who presents via audio/video conferencing for a telehealth visit today.  Since last being seen in our clinic aborted cardiac arrest for which she is status post [place] ICD socially complicated by inappropriate ICD discharges secondary to atrial tachycardia for which he underwent catheter ablation (JA) , the patient reports doing pretty well--crohns has been quiet  C/o intermittent palpitations provoked and aggravated by caffeine   The patient denies chest pain, shortness of breath, nocturnal dyspnea, orthopnea or peripheral edema.  There have been no , lightheadedness or syncope.   But NOT very active     DATE TEST EF   11/15 MYOVIEW 52% No ischemia/infarct  12/17 Echo 55-65%          anemia and Fe def and needing iron infusion  Dizziness improved -- therapy has been benefit  Date Cr K Hgb TSH  1/16      1.053  10/17 1.04 2.8  1.69   10/18 1.01 4.0    8/19 1.08 3.8    6/20 1.13 4.5 12.3   11/21 1.01 3.5 11.9    Reviewed family history, mother died suddenly 2 months before she turned 72-- by cardiac arrest/heart attack .   The patient denies symptoms of fevers, chills, cough, or new SOB worrisome for COVID 19.    Past Medical History:  Diagnosis Date   AICD (automatic  cardioverter/defibrillator) present 09/14/2015   STJ dual chamber ICD implanted by Dr Caryl Comes for aborted cardiac arrest   Allergy    Bradycardia    Cardiac arrest ([place]) 08/31/2014   Crohn disease ([place])    Depression    History of blood transfusion 09/2012   "had allergic rxn to one of my Crohn's RX"    Migraine    "a few times/yr" (06/08/2015)   Pancytopenia ([place]) 11/23/2012   Status post radiofrequency ablation for arrhythmia, atrial tach 06/08/15 06/08/2015   SVT (supraventricular tachycardia) ([place]) 12/08/2014   likely an atrial tachycardia with CL 250 msec    Past Surgical History:  Procedure Laterality Date   ABLATION     ELECTROPHYSIOLOGIC STUDY N/A 06/08/2015   Procedure: SVT Ablation;  Surgeon: Thompson Grayer, MD;  Location: [place] [facility] [place] [facility];  Service: Cardiovascular;  Laterality: N/A;   ELECTROPHYSIOLOGY STUDY N/A 09/11/2014   Procedure: ELECTROPHYSIOLOGY STUDY;  Surgeon: Deboraha Sprang, MD;  Location: [facility] CATH LAB;  Service: Cardiovascular;  Laterality: N/A;   IMPLANTABLE CARDIOVERTER DEFIBRILLATOR IMPLANT N/A 09/13/2014   Procedure: IMPLANTABLE CARDIOVERTER DEFIBRILLATOR IMPLANT;  Surgeon: Deboraha Sprang, MD;  Location: [facility] CATH LAB;  Service: Cardiovascular;  Laterality: N/A;; STJ dual chamber ICD implanted by Dr Caryl Comes for aborted cardiac arrest    Current Outpatient Medications  Medication Sig Dispense Refill   acetaminophen (TYLENOL) 325 MG tablet Take 650 mg by mouth every  6 (six) hours as needed for headache.     budesonide (ENTOCORT EC) 3 MG 24 hr capsule Take 6 mg by mouth daily.     cetirizine (ZYRTEC) 10 MG tablet Take 10 mg by mouth daily.     Cholecalciferol (VITAMIN D3 PO) Take 50,000 Units by mouth once a week.     Cholecalciferol (VITAMIN D3) 1.25 MG (50000 UT) CAPS Take 1 capsule by mouth once a week.     Cobalamin Combinations (B-12) [phone] MCG SUBL Place 1 tablet under the tongue daily.     diazepam (VALIUM) 5 MG tablet Take 1 tablet (5 mg  total) by mouth every 12 (twelve) hours as needed (vertigpo). 10 tablet 0   Galcanezumab-gnlm (EMGALITY) 120 MG/ML SOAJ Inject 120 mg into the skin every 30 (thirty) days. 1 mL 11   KLOR-CON M20 20 MEQ tablet TAKE 1 TABLET BY MOUTH EVERY DAY 90 tablet 2   Pediatric Multiple Vit-C-FA (ANIMAL CHEWABLE MULTIVITAMIN PO) Take 1 tablet by mouth daily.     SUMAtriptan (IMITREX) 100 MG tablet Take 1 tablet (100 mg total) by mouth every 2 (two) hours as needed for migraine. May repeat in 2 hours if headache persists or recurs. 10 tablet 2   thiamine 100 MG tablet Take 300 mg by mouth.      ustekinumab (STELARA) 90 MG/ML SOSY injection Use as directed     calcium carbonate (CALCIUM 600) 600 MG TABS tablet Take 1,500 mg by mouth daily. (Patient not taking: Reported on 10/11/2020)     No current facility-administered medications for this visit.    Allergies:   Sulfa antibiotics and Sulfacetamide sodium   Social History:  The patient  reports that she has never smoked. She has never used smokeless tobacco. She reports current alcohol use. She reports that she does not use drugs.   Family History:  The patient's   family history includes Asthma in her paternal grandmother; Diabetes in her maternal grandfather and paternal grandfather; Heart attack in her maternal grandmother and mother; Heart disease in her maternal grandmother and mother; Migraines in her mother.   ROS:  Please see the history of present illness.   All other systems are personally reviewed and negative.    Exam:    Vital Signs:  BP 124/88    Pulse 71    Ht 5' 5"  (1.651 m)    Wt 178 lb (80.7 kg)    BMI 29.62 kg/m     Well appearing, alert and conversant, regular work of breathing,  good skin color Eyes- anicteric, neuro- grossly intact, skin- no apparent rash or lesions or cyanosis, mouth- oral mucosa is pink   Labs/Other Tests and Data Reviewed:    Recent Labs: No results found for requested labs within last 8760 hours.    Wt Readings from Last 3 Encounters:  10/11/20 178 lb (80.7 kg)  07/12/20 178 lb 9.6 oz (81 kg)  01/05/20 176 lb (79.8 kg)     Other studies personally reviewed:    Last device remote is reviewed from [place] PDF dated 10/21 which reveals normal device function,   arrhythmias - ventricular bigeminy   ASSESSMENT & PLAN:   Aborted cardiac arrest  ICD St Jude   Atrial tachycardia  status post RFCA-JA  Crohn's disease  Dizziness-improved   Rightward axis new  Iron deficiency anemia  Hypokalemia   palpitations     Cardiac status stable-- device interrogations show two potential mechanism of palps- in addition to her history of  atrial tach--1) pvc and 2) and isorhythmic rhtyhm could be AVNRT or atrial tach w !AVB  willfollow  FeDef anemia   Encouraged to ask as to cause  Hypokalemia--asked as to whether could stop replacement   Most recent labs 11/21 on replacement 3.5 so advised that we not stop  She reminds me that her mother, only child,died suddenly at 22+ yo "heart attach" no autopsy ? Familial hypokalemia ??   COVID 19 screen The patient denies symptoms of COVID 19 at this time.  The importance of social distancing was discussed today.  Follow-up:  53 m Next remote: As Scheduled   Current medicines are reviewed at length with the patient today.   The patient has concerns regarding her medicines.  The following changes were made today:  Discussed the need to continue potassium] NEEDS refilo  Labs/ tests ordered today include: echo for cardiomyopathy No orders of the defined types were placed in this encounter.   Future tests ( post COVID )     Patient Risk:  after full review of this patients clinical status, I feel that they are at moderate risk at this time.  Today, I have spent  15 minutes with the patient with telehealth technology discussing the above.  Signed, Virl Axe, MD  10/11/2020 12:35 PM     [place] [address] [place] [address] [phone] (office) [phone] (fax)

## 2020-10-11 NOTE — Patient Instructions (Signed)
Medication Instructions:  -- REFILLED Potassium *If you need a refill on your cardiac medications before your next appointment, please call your pharmacy*  Testing/Procedures: Your physician has requested that you have an echocardiogram. Echocardiography is a painless test that uses sound waves to create images of your heart. It provides your doctor with information about the size and shape of your heart and how well your heart's chambers and valves are working. This procedure takes approximately one hour. There are no restrictions for this procedure.  Follow-Up: At [facility], you and your health needs are our priority.  As part of our continuing mission to provide you with exceptional heart care, we have created designated Provider Care Teams.  These Care Teams include your primary Cardiologist (physician) and Advanced Practice Providers (APPs -  Physician Assistants and Nurse Practitioners) who all work together to provide you with the care you need, when you need it.  We recommend signing up for the patient portal called "MyChart".  Sign up information is provided on this After Visit Summary.  MyChart is used to connect with patients for Virtual Visits (Telemedicine).  Patients are able to view lab/test results, encounter notes, upcoming appointments, etc.  Non-urgent messages can be sent to your provider as well.   To learn more about what you can do with MyChart, go to [email or web address].    Your next appointment:   Your physician wants you to follow-up in: 1 YEAR with Dr. Caryl Comes. You will receive a reminder letter in the mail two months in advance. If you don't receive a letter, please call our office to schedule the follow-up appointment.  The format for your next appointment:   In Person with Virl Axe, MD

## 2020-11-06 ENCOUNTER — Other Ambulatory Visit: Payer: Self-pay

## 2020-11-06 ENCOUNTER — Ambulatory Visit (HOSPITAL_COMMUNITY): Payer: 59 | Attending: Cardiology

## 2020-11-06 DIAGNOSIS — I429 Cardiomyopathy, unspecified: Secondary | ICD-10-CM | POA: Insufficient documentation

## 2020-11-06 LAB — ECHOCARDIOGRAM COMPLETE
Area-P 1/2: 4.1 cm2
S' Lateral: 3.75 cm

## 2020-11-12 ENCOUNTER — Telehealth: Payer: Self-pay

## 2020-11-12 NOTE — Telephone Encounter (Signed)
-----   Message from Deboraha Sprang, MD sent at 11/09/2020  2:24 PM EST ----- Please Inform Patient Echo shows interval worsening of heart muscle function-- not sure why. Could you please arrange an office visit with me-- double book--early or late would be fine Thanks SK

## 2020-11-12 NOTE — Telephone Encounter (Signed)
Attempted phone call to pt and left voicemail to contact RN at [phone].

## 2020-11-14 NOTE — Telephone Encounter (Signed)
Attempted phone calls to pt's home number with no answer and pt's mobile number which states mailbox is full and cannot accept new messages.  MyChart message sent re: echo results and to contact RN.

## 2020-11-15 NOTE — Telephone Encounter (Signed)
Patient called back and asked for Rosann Auerbach to discuss test results. Patient will call back tomorrow

## 2020-11-15 NOTE — Telephone Encounter (Signed)
I placed call to patient but mailbox is full.

## 2020-11-16 ENCOUNTER — Telehealth: Payer: Self-pay | Admitting: Internal Medicine

## 2020-11-16 NOTE — Telephone Encounter (Signed)
Spoke with pt and advised per Dr Caryl Comes echo shows worsening heart muscle function.  Pt has been scheduled for office visit on 11/20/2020 with Dr Caryl Comes for further evaluation.  Pt verbalizes understanding and had no further questions.

## 2020-11-16 NOTE — Telephone Encounter (Signed)
Patient is calling back for echo results. Please call back to [phone]

## 2020-11-20 ENCOUNTER — Other Ambulatory Visit: Payer: Self-pay

## 2020-11-20 ENCOUNTER — Encounter: Payer: Self-pay | Admitting: Internal Medicine

## 2020-11-20 ENCOUNTER — Ambulatory Visit (INDEPENDENT_AMBULATORY_CARE_PROVIDER_SITE_OTHER): Payer: 59 | Admitting: Internal Medicine

## 2020-11-20 VITALS — BP 116/80 | HR 68 | Ht 65.0 in | Wt 178.6 lb

## 2020-11-20 DIAGNOSIS — I4901 Ventricular fibrillation: Secondary | ICD-10-CM | POA: Diagnosis not present

## 2020-11-20 DIAGNOSIS — I472 Ventricular tachycardia, unspecified: Secondary | ICD-10-CM

## 2020-11-20 DIAGNOSIS — Z95810 Presence of automatic (implantable) cardiac defibrillator: Secondary | ICD-10-CM

## 2020-11-20 DIAGNOSIS — I429 Cardiomyopathy, unspecified: Secondary | ICD-10-CM

## 2020-11-20 DIAGNOSIS — I471 Supraventricular tachycardia: Secondary | ICD-10-CM | POA: Diagnosis not present

## 2020-11-20 DIAGNOSIS — I5022 Chronic systolic (congestive) heart failure: Secondary | ICD-10-CM | POA: Diagnosis not present

## 2020-11-20 DIAGNOSIS — I469 Cardiac arrest, cause unspecified: Secondary | ICD-10-CM | POA: Diagnosis not present

## 2020-11-20 MED ORDER — CARVEDILOL 3.125 MG PO TABS: 3.1250 mg | ORAL_TABLET | Freq: Two times a day (BID) | ORAL | 3 refills | Status: DC

## 2020-11-20 NOTE — Patient Instructions (Signed)
Medication Instructions:  Your physician has recommended you make the following change in your medication:   Begin Carvedilol 3.125mg  - 1 tablet by mouth twice daily  *If you need a refill on your cardiac medications before your next appointment, please call your pharmacy*   Lab Work: None ordered.  If you have labs (blood work) drawn today and your tests are completely normal, you will receive your results only by: Marland Kitchen MyChart Message (if you have MyChart) OR . A paper copy in the mail If you have any lab test that is abnormal or we need to change your treatment, we will call you to review the results.   Testing/Procedures: None ordered.    Follow-Up: At [facility], you and your health needs are our priority.  As part of our continuing mission to provide you with exceptional heart care, we have created designated Provider Care Teams.  These Care Teams include your primary Cardiologist (physician) and Advanced Practice Providers (APPs -  Physician Assistants and Nurse Practitioners) who all work together to provide you with the care you need, when you need it.  We recommend signing up for the patient portal called "MyChart".  Sign up information is provided on this After Visit Summary.  MyChart is used to connect with patients for Virtual Visits (Telemedicine).  Patients are able to view lab/test results, encounter notes, upcoming appointments, etc.  Non-urgent messages can be sent to your provider as well.   To learn more about what you can do with MyChart, go to [email or web address].    Your next appointment:   3 week(s)  The format for your next appointment:   In Person  Provider:   You will see one of the following Advanced Practice Providers on your designated Care Team:    Chanetta Marshall, NP  Tommye Standard, PA-C  Legrand Como "[place]" [place], [place]

## 2020-11-20 NOTE — Progress Notes (Signed)
Patient Care Team: Alroy Dust, L.Marlou Sa, MD as PCP - General (Family Medicine)   HPI  Julie Saunders is a 35 y.o. female Seen in follow-up for aborted cardiac arrest 2015.  No specific cause was identified. She was treated with beta blockers initially but these were stopped because of hypotension and dizziness. During cardiac rehabilitation she had an episode with ICD discharges.    She has subsequently undergone EPS RFCA  And these records were reveiewed>>ectopic tachycardia successful  Recurrent palps >> egrams suggest PVC/AVNRT or Atrial Tach with 1 AVB  No dizziness, chest pain syncope  Some SOB w exertion but doing ADLs without difficulty.         She reminds me that her mother, only child,died suddenly at 76+ yo "heart attach" no autopsy ? Familial hypokalemia ??     DATE TEST EF   11/15 MYOVIEW 52% No ischemia/infarct  12/17 Echo 55-65%   12/21 Echo  35-40%       Dizziness now for months, initially 24/7 now somewhat improved; undergoing neuro eval   Date Cr K Hgb TSH  1/16      1.053  10/17 1.04 2.8  1.69   10/18 1.01 4.0    8/19 1.08 3.8    6/20 1.13 4.5 12.3    Reviewed family history, mother died suddenly 2 months before her daughter's cardiac arrest.  Somehow this association was missed.  I reviewed her mother's home records, nothing really is informative.  She had a history of mitral valve prolapse apparently according to the daughter no other cardiac history.  No echo is available.            [place]   Past Medical History:  Diagnosis Date  . 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 Allergy   . Bradycardia   . Cardiac arrest ([place]) 08/31/2014  . Crohn disease ([place])   . Depression   . History of blood transfusion 09/2012   "had allergic rxn to one of my Crohn's RX"   . Migraine    "a few times/yr" (06/08/2015)  . Pancytopenia ([place]) 11/23/2012  . Status  post radiofrequency ablation for arrhythmia, atrial tach 06/08/15 06/08/2015  . SVT (supraventricular tachycardia) ([place]) 12/08/2014   likely an atrial tachycardia with CL 250 msec    Past Surgical History:  Procedure Laterality Date  . ABLATION    . ELECTROPHYSIOLOGIC STUDY N/A 06/08/2015   Procedure: SVT Ablation;  Surgeon: Thompson Grayer, MD;  Location: [facility];  Service: Cardiovascular;  Laterality: N/A;  . ELECTROPHYSIOLOGY STUDY N/A 09/11/2014   Procedure: ELECTROPHYSIOLOGY STUDY;  Surgeon: Deboraha Sprang, MD;  Location: [facility] CATH LAB;  Service: Cardiovascular;  Laterality: N/A;  . IMPLANTABLE CARDIOVERTER DEFIBRILLATOR IMPLANT N/A 09/13/2014   Procedure: IMPLANTABLE CARDIOVERTER DEFIBRILLATOR IMPLANT;  Surgeon: Deboraha Sprang, MD;  Location: [facility] CATH LAB;  Service: Cardiovascular;  Laterality: N/A;; STJ dual chamber ICD implanted by Dr Caryl Comes for aborted cardiac arrest    Current Outpatient Medications  Medication Sig Dispense Refill  . acetaminophen (TYLENOL) 325 MG tablet Take 650 mg by mouth every 6 (six) hours as needed for headache.    . budesonide (ENTOCORT EC) 3 MG 24 hr capsule Take 9 mg by mouth daily.    . cetirizine (ZYRTEC) 10 MG tablet Take 10 mg by mouth daily.    . Cholecalciferol (VITAMIN D3 PO) Take 50,000 Units by mouth  once a week.    . Cholecalciferol (VITAMIN D3) 1.25 MG (50000 UT) CAPS Take 1 capsule by mouth once a week.    . Cobalamin Combinations (B-12) [phone] MCG SUBL Place 1 tablet under the tongue daily.    . diazepam (VALIUM) 5 MG tablet Take 1 tablet (5 mg total) by mouth every 12 (twelve) hours as needed (vertigpo). 10 tablet 0  . Galcanezumab-gnlm (EMGALITY) 120 MG/ML SOAJ Inject 120 mg into the skin every 30 (thirty) days. 1 mL 11  . Pediatric Multiple Vit-C-FA (ANIMAL CHEWABLE MULTIVITAMIN PO) Take 1 tablet by mouth daily.    . potassium chloride SA (KLOR-CON M20) 20 MEQ tablet Take 1 tablet (20 mEq total) by mouth daily. 90 tablet 3  . SUMAtriptan  (IMITREX) 100 MG tablet Take 1 tablet (100 mg total) by mouth every 2 (two) hours as needed for migraine. May repeat in 2 hours if headache persists or recurs. 10 tablet 2  . thiamine 100 MG tablet Take 300 mg by mouth.     . ustekinumab (STELARA) 90 MG/ML SOSY injection Use as directed     No current facility-administered medications for this visit.    Allergies  Allergen Reactions  . Sulfa Antibiotics Rash  . Sulfacetamide Sodium Rash    Review of Systems negative except from HPI and PMH  Physical Exam BP 116/80   Pulse 68   Ht 5' 5"  (1.651 m)   Wt 178 lb 9.6 oz (81 kg)   BMI 29.72 kg/m  Well developed and well nourished in no acute distress HENT normal Neck supple with JVP-flat Clear Device pocket well healed; without hematoma or erythema.  There is no tethering  Regular rate and rhythm, no   murmur Abd-soft with active BS No Clubbing cyanosis tr edema Skin-warm and dry A & Oriented  Grossly normal sensory and motor function  ECG sinus @ 68 15/10/44    Assessment and  Plan  Aborted cardiac arrest  ICD St Jude The patient's device was interrogated.  The information was reviewed. No changes were made in the programming.     Cardiomyopathy-progressive  Supraventricular tachycardia  status post RFCA-JA  Crohn's disease  Dizziness  Rightward axis new  Iron deficiency anemia   No ventricular arrhythmia  No interval dizziness but this and prior intolerance of beta-blockers makes potential therapies of her newly identified worsening of her cardiomyopathy challenging.  We will start with low-dose beta-blockers, carvedilol 3.125 twice daily.  If she is able to tolerate this we will add low-dose losartan 12.5 mg and then move onto spironolactone 12.5 mg.  Maybe there will be enough blood pressure for Entresto and an SGLT2 but I doubt it.  We will do his whenever we can by telehealth, however, given her low blood pressures and her sensitivity to medications this may  need to be done in office with frequent visits.  Have reviewed with him the concern issues and medications.      No ventricular arrhythmias  Dizziness being addressed by nuero BP is much better than when orthostatics last done  With RAD now on ECG, will check echo to look for evidence of R H strain

## 2020-11-29 ENCOUNTER — Ambulatory Visit (INDEPENDENT_AMBULATORY_CARE_PROVIDER_SITE_OTHER): Payer: 59

## 2020-11-29 DIAGNOSIS — I4901 Ventricular fibrillation: Secondary | ICD-10-CM

## 2020-11-30 LAB — CUP PACEART REMOTE DEVICE CHECK
Battery Remaining Longevity: 37 mo
Battery Remaining Percentage: 38 %
Battery Voltage: 2.87 V
Brady Statistic AP VP Percent: 1 %
Brady Statistic AP VS Percent: 51 %
Brady Statistic AS VP Percent: 1 %
Brady Statistic AS VS Percent: 47 %
Brady Statistic RA Percent Paced: 47 %
Brady Statistic RV Percent Paced: 1 %
Date Time Interrogation Session: 20220120020015
HighPow Impedance: 72 Ohm
HighPow Impedance: 72 Ohm
Implantable Lead Implant Date: 20151104
Implantable Lead Implant Date: 20151104
Implantable Lead Location: 753859
Implantable Lead Location: 753860
Implantable Lead Model: 5076
Implantable Pulse Generator Implant Date: 20151104
Lead Channel Impedance Value: 340 Ohm
Lead Channel Impedance Value: 430 Ohm
Lead Channel Pacing Threshold Amplitude: 0.5 V
Lead Channel Pacing Threshold Amplitude: 0.75 V
Lead Channel Pacing Threshold Pulse Width: 0.4 ms
Lead Channel Pacing Threshold Pulse Width: 0.4 ms
Lead Channel Sensing Intrinsic Amplitude: 11.8 mV
Lead Channel Sensing Intrinsic Amplitude: 5 mV
Lead Channel Setting Pacing Amplitude: 2 V
Lead Channel Setting Pacing Amplitude: 2.5 V
Lead Channel Setting Pacing Pulse Width: 0.4 ms
Lead Channel Setting Sensing Sensitivity: 0.5 mV
Pulse Gen Serial Number: 7179320

## 2020-12-04 NOTE — Progress Notes (Addendum)
Cardiology Office Note Date:  12/04/2020  Patient ID:  Julie, Saunders 01-28-1986, MRN [id number] PCP:  Aurea Graff.Marlou Sa, MD  Electrophysiologist:  Dr. Caryl Comes   Chief Complaint:  medication titration  History of Present Illness: Julie Saunders is a 35 y.o. 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 Caryl Comes, last seen by him 11/20/20 Notes   Recurrent palps >> egrams suggest PVC/AVNRT or Atrial Tach with 1 AVB He also notes "She reminds me that her mother, only child,died suddenly at 60+ yo "heart attach" no autopsy ? Familial hypokalemia ??"  Recurrent reduction in LVEF prompted discussion on medicines and her history of intolerance to BB with dizziness in the past. Plan: "We will start with low-dose beta-blockers, carvedilol 3.125 twice daily.  If she is able to tolerate this we will add low-dose losartan 12.5 mg and then move onto spironolactone 12.5 mg.  Maybe there will be enough blood pressure for Entresto and an SGLT2 but I doubt it. We will do his whenever we can by telehealth, however, given her low blood pressures and her sensitivity to medications this may need to be done in office with frequent visits."  TODAY: She is accompanied today by her father She feels pretty terrible, has had clear and marked increase in her dizziness with the carvedilol and has been very dizzy, lightheaded and weak since starting it.  "I feel awful"  In d/w her about her dizziness, sounds like she has vertigo AND orthostatic dizziness. She tells me that she has been seeing Dr. Roddie Mc for headaches and dizziness, and a couple years ago went to PT (that sounds like vestibular training) that did give her some benefit. Today the dizziness was so bad that she took a valium and did get help with it  but not resolution. She has not fallen or fainted. She reports as well positional lightheadedness and weakness.  No CP, no palpitations, no shocks She does get winded with exertion, hard to get a feel for how much that takes since she generally feels poorly.  She denies much pain with her Chrons, no bleeding, no diarrhea, tends to be constipated  09/13/20  H/H 11.9/37.9 BUN/Creat 12/1.01 K+ 3.5 LFTs wnl  Device History: STJ ICD implanted 2016 for cardiac arrest  History of appropriate therapy: no -  + inappropriate therapy for SVT, 12/08/14, BB were resumed History of AAD therapy: no   09/11/14: Procedure:epinephrine infusion observation Cx: None  EBL: Minimal   Pt received graduated epinephrine infusions at 0.05, 0.1 and0.2 mcg/kg min QTc intervals were stable at 427msec Rare ectopics Will give flecainide overnight to assess for brugada >>> Flecainide challenge negative for Brugada    Past Medical History:  Diagnosis Date  . 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 Allergy   . Bradycardia   . Cardiac arrest ([place]) 08/31/2014  . Crohn disease ([place])   . Depression   . History of blood transfusion 09/2012   "had allergic rxn to one of my Crohn's RX"   . Migraine    "a few times/yr" (06/08/2015)  . Pancytopenia ([place]) 11/23/2012  . Status post radiofrequency ablation for arrhythmia, atrial tach 06/08/15 06/08/2015  . SVT (supraventricular tachycardia) ([place]) 12/08/2014   likely  an atrial tachycardia with CL 250 msec    Past Surgical History:  Procedure Laterality Date  . ABLATION    . ELECTROPHYSIOLOGIC STUDY N/A 06/08/2015   Procedure: SVT Ablation;  Surgeon: Thompson Grayer, MD;  Location: [facility] LAB;  Service: Cardiovascular;  Laterality: N/A;  . ELECTROPHYSIOLOGY STUDY N/A 09/11/2014   Procedure: ELECTROPHYSIOLOGY STUDY;  Surgeon: Deboraha Sprang, MD;  [facility] CATH  LAB;  Service: Cardiovascular;  Laterality: N/A;  . IMPLANTABLE CARDIOVERTER DEFIBRILLATOR IMPLANT N/A 09/13/2014   Procedure: IMPLANTABLE CARDIOVERTER DEFIBRILLATOR IMPLANT;  Surgeon: Deboraha Sprang, MD;  Location: [facility] CATH LAB;  Service: Cardiovascular;  Laterality: N/A;; STJ dual chamber ICD implanted by Dr Caryl Comes for aborted cardiac arrest    Current Outpatient Medications  Medication Sig Dispense Refill  . acetaminophen (TYLENOL) 325 MG tablet Take 650 mg by mouth every 6 (six) hours as needed for headache.    . budesonide (ENTOCORT EC) 3 MG 24 hr capsule Take 9 mg by mouth daily.    . carvedilol (COREG) 3.125 MG tablet Take 1 tablet (3.125 mg total) by mouth 2 (two) times daily. 180 tablet 3  . cetirizine (ZYRTEC) 10 MG tablet Take 10 mg by mouth daily.    . Cholecalciferol (VITAMIN D3 PO) Take 50,000 Units by mouth once a week.    . Cholecalciferol (VITAMIN D3) 1.25 MG (50000 UT) CAPS Take 1 capsule by mouth once a week.    . Cobalamin Combinations (B-12) [phone] MCG SUBL Place 1 tablet under the tongue daily.    . diazepam (VALIUM) 5 MG tablet Take 1 tablet (5 mg total) by mouth every 12 (twelve) hours as needed (vertigpo). 10 tablet 0  . Galcanezumab-gnlm (EMGALITY) 120 MG/ML SOAJ Inject 120 mg into the skin every 30 (thirty) days. 1 mL 11  . Pediatric Multiple Vit-C-FA (ANIMAL CHEWABLE MULTIVITAMIN PO) Take 1 tablet by mouth daily.    . potassium chloride SA (KLOR-CON M20) 20 MEQ tablet Take 1 tablet (20 mEq total) by mouth daily. 90 tablet 3  . SUMAtriptan (IMITREX) 100 MG tablet Take 1 tablet (100 mg total) by mouth every 2 (two) hours as needed for migraine. May repeat in 2 hours if headache persists or recurs. 10 tablet 2  . thiamine 100 MG tablet Take 300 mg by mouth.     . ustekinumab (STELARA) 90 MG/ML SOSY injection Use as directed     No current facility-administered medications for this visit.    Allergies:   Sulfa antibiotics and Sulfacetamide sodium   Social History:   The patient  reports that she has never smoked. She has never used smokeless tobacco. She reports current alcohol use. She reports that she does not use drugs.   Family History:  The patient's family history includes Asthma in her paternal grandmother; Diabetes in her maternal grandfather and paternal grandfather; Heart attack in her maternal grandmother and mother; Heart disease in her maternal grandmother and mother; Migraines in her mother.  ROS:  Please see the history of present illness.  All other systems are reviewed and otherwise negative.   PHYSICAL EXAM:  VS:  There were no vitals taken for this visit. BMI: There is no height or weight on file to calculate BMI. Well nourished, well developed, in no acute distress  HEENT: normocephalic, atraumatic  Neck: no JVD, carotid bruits or masses Cardiac:  RRR; no significant murmurs, no rubs, or gallops Lungs:  CTA b/l, no wheezing, rhonchi or rales  Abd: soft, nontender MS: no deformity  or atrophy Ext:  no edema  Skin: warm and dry, no rash Neuro:  No gross deficits appreciated Psych: euthymic mood, full affect   ICD site is stable, no tethering or discomfort   EKG: not done today  Device interrogation today and reviewed by myself Battery and lead measurements are good No arrhythmias   11/06/2020: TTE IMPRESSIONS 1. Left ventricular ejection fraction, by estimation, is 35 to 40%. The  left ventricle has moderately decreased function. The left ventricle  demonstrates global hypokinesis. Left ventricular diastolic parameters are  consistent with Grade I diastolic  dysfunction (impaired relaxation).  2. Right ventricular systolic function is normal. The right ventricular  size is normal.  3. The mitral valve is normal in structure. No evidence of mitral valve  regurgitation. No evidence of mitral stenosis.  4. The aortic valve is normal in structure. Aortic valve regurgitation is  not visualized. No aortic stenosis is  present.  5. The inferior vena cava is normal in size with greater than 50%  respiratory variability, suggesting right atrial pressure of 3 mmHg.    09/30/2019: TTE IMPRESSIONS  1. Left ventricular ejection fraction, by visual estimation, is 50-55%.  The left ventricle has low normal function. There is no left ventricular  hypertrophy. Normal diastolic function.  2. Global right ventricle has normal systolic function.The right  ventricular size is mildly enlarged.  3. Left atrial size was normal.  4. Right atrial size was normal.  5. The mitral valve is normal in structure. No evidence of mitral valve  regurgitation.  6. The tricuspid valve is normal in structure. Tricuspid valve  regurgitation is trivial.  7. The aortic valve is normal in structure. Aortic valve regurgitation is  not visualized. No evidence of aortic valve sclerosis or stenosis.  8. The pulmonic valve was not well visualized. Pulmonic valve  regurgitation is not visualized.  9. Normal pulmonary artery systolic pressure.  10. The inferior vena cava is normal in size with greater than 50%  respiratory variability, suggesting right atrial pressure of 3 mmHg.   06/04/16: Echocardiogram Study Conclusions - Left ventricle: Diffuse hypokinesis worse in the inferior wall   The cavity size was moderately dilated. Wall thickness was   normal. The estimated ejection fraction was 35%. Left ventricular   diastolic function parameters were normal. - Atrial septum: No defect or patent foramen ovale was identified.  06/08/2015: EPS/Ablation CONCLUSIONS: 1. Sinus rhythm upon presentation.   2. Easily inducible and incessant atrial tachycardia (clinial tachycardia) successfully ablated at the floor of the ostium of the coronary sinus 3. Dual AV nodal physiology was present however she has not had clinical AVNRT in the past or today (even on isuprel).  I therefore elected to not perform slow pathway ablation 4. No  inducible arrhythmias following ablation both on and off of Isuprel 5. No early apparent complications.   09/12/14: Lexiscan stress test IMPRESSION: 1. No scintigraphic evidence of prior infarction or pharmacologically induced ischemia on this motion degraded examination. 2. Mild diffuse / global hypokinesia. 3. Left ventricular ejection fraction 52% 4. Low-risk stress test findings*.  09/08/14: EF 60% 08/31/14: EF 25-30%   Recent Labs: No results found for requested labs within last 8760 hours.  No results found for requested labs within last 8760 hours.   CrCl cannot be calculated (Patient's most recent lab result is older than the maximum 21 days allowed.).   Wt Readings from Last 3 Encounters:  11/20/20 178 lb 9.6 oz (81 kg)  10/11/20 178 lb (  80.7 kg)  07/12/20 178 lb 9.6 oz (81 kg)     Other studies reviewed: Additional studies/records reviewed today include: summarized above  ASSESSMENT AND PLAN:  1.  Aborted cardiac arrest, Polymorphic VT, Oct. 2015      ICD       normal ICD function, no changes today         2.  SVT       S/p ablation with no recurrence  3.  Orthostatic intolerance       Lifestyle modifications, instruction on changing positions slowly, not to walk away from her chair right away and to sit or lay down for symptoms, all re-discussed with her today       Orthostatics  Today noted Supine 102/80, 71bpm Sitting 120/80, 76 Standing 106/80, 82 57min 120/80, 78  She said she was dizzy throughout, not particularly worse with position changes         4. NICM     Negative stress test at the time of her arrest     EF has waxed/waned over the years going back to 2015     Exam appears euvolemic, CorVue is at threshold     She reports clear worsening of her dizziness/lightheaded that has been intolerable   I think her dizziness is multifactorial, though definitely worse on coreg. I have asked her to reduce to once daily in the evening. I did not  explore the addition of further GDMT today. I discussed with her AHF team consult to better investigate and treat her intermittent reduction in LVEF, she would be agreeable if Dr. Caryl Comes though so as well.  I will send my note to Dr. Caryl Comes for his thoughts and recommendation.   Will have her back in a couple weeks, sooner if needed.       Disposition: as above  ADDEND 12/10/20: Spoke to Dr. Caryl Comes. Stop Coreg and refer to AHF team. I have staff messaged AHF team APP to assist in getting her scheduled and asked my MA to call the patient to stop the coreg and make her aware of the referral.   Julie Standard, PA-C     Current medicines are reviewed at length with the patient today.  The patient did not have any concerns regarding medicines.  Haywood Lasso, PA-C 12/04/2020 4:27 PM     [address] [place] [address] [phone] (office)  [phone] (fax)

## 2020-12-05 ENCOUNTER — Encounter: Payer: 59 | Admitting: Physician Assistant

## 2020-12-06 ENCOUNTER — Encounter: Payer: Self-pay | Admitting: Physician Assistant

## 2020-12-06 ENCOUNTER — Other Ambulatory Visit: Payer: Self-pay

## 2020-12-06 ENCOUNTER — Ambulatory Visit: Payer: 59 | Admitting: Physician Assistant

## 2020-12-06 VITALS — BP 116/80 | HR 79 | Ht 65.0 in | Wt 183.0 lb

## 2020-12-06 DIAGNOSIS — I472 Ventricular tachycardia, unspecified: Secondary | ICD-10-CM

## 2020-12-06 DIAGNOSIS — I951 Orthostatic hypotension: Secondary | ICD-10-CM

## 2020-12-06 DIAGNOSIS — I471 Supraventricular tachycardia: Secondary | ICD-10-CM | POA: Diagnosis not present

## 2020-12-06 DIAGNOSIS — Z95810 Presence of automatic (implantable) cardiac defibrillator: Secondary | ICD-10-CM

## 2020-12-06 DIAGNOSIS — I428 Other cardiomyopathies: Secondary | ICD-10-CM

## 2020-12-06 LAB — CUP PACEART INCLINIC DEVICE CHECK
Battery Remaining Longevity: 38 mo
Brady Statistic RA Percent Paced: 50 %
Brady Statistic RV Percent Paced: 0.18 %
Date Time Interrogation Session: 20220127174916
HighPow Impedance: 69.75 Ohm
Implantable Lead Implant Date: 20151104
Implantable Lead Implant Date: 20151104
Implantable Lead Location: 753859
Implantable Lead Location: 753860
Implantable Lead Model: 5076
Implantable Pulse Generator Implant Date: 20151104
Lead Channel Impedance Value: 350 Ohm
Lead Channel Impedance Value: 487.5 Ohm
Lead Channel Pacing Threshold Amplitude: 0.5 V
Lead Channel Pacing Threshold Amplitude: 0.5 V
Lead Channel Pacing Threshold Amplitude: 1 V
Lead Channel Pacing Threshold Amplitude: 1 V
Lead Channel Pacing Threshold Pulse Width: 0.4 ms
Lead Channel Pacing Threshold Pulse Width: 0.4 ms
Lead Channel Pacing Threshold Pulse Width: 0.4 ms
Lead Channel Pacing Threshold Pulse Width: 0.4 ms
Lead Channel Sensing Intrinsic Amplitude: 11.8 mV
Lead Channel Sensing Intrinsic Amplitude: 4.3 mV
Lead Channel Setting Pacing Amplitude: 2 V
Lead Channel Setting Pacing Amplitude: 2.5 V
Lead Channel Setting Pacing Pulse Width: 0.4 ms
Lead Channel Setting Sensing Sensitivity: 0.5 mV
Pulse Gen Serial Number: 7179320

## 2020-12-06 MED ORDER — CARVEDILOL 3.125 MG PO TABS: 3.1250 mg | ORAL_TABLET | Freq: Every day | ORAL | 3 refills | Status: DC

## 2020-12-06 NOTE — Patient Instructions (Signed)
Medication Instructions:    START TAKING COREG 3.125 MG AT NIGHT   *If you need a refill on your cardiac medications before your next appointment, please call your pharmacy*   Lab Work: NONE ORDERED  TODAY  If you have labs (blood work) drawn today and your tests are completely normal, you will receive your results only by: Marland Kitchen MyChart Message (if you have MyChart) OR . A paper copy in the mail If you have any lab test that is abnormal or we need to change your treatment, we will call you to review the results.   Testing/Procedures: NONE ORDERED  TODAY    Follow-Up: At [facility], you and your health needs are our priority.  As part of our continuing mission to provide you with exceptional heart care, we have created designated Provider Care Teams.  These Care Teams include your primary Cardiologist (physician) and Advanced Practice Providers (APPs -  Physician Assistants and Nurse Practitioners) who all work together to provide you with the care you need, when you need it.  We recommend signing up for the patient portal called "MyChart".  Sign up information is provided on this After Visit Summary.  MyChart is used to connect with patients for Virtual Visits (Telemedicine).  Patients are able to view lab/test results, encounter notes, upcoming appointments, etc.  Non-urgent messages can be sent to your provider as well.   To learn more about what you can do with MyChart, go to [email or web address].    Your next appointment:   2 week(s)  The format for your next appointment:   In Person  Provider:   You may see Dr.Klein  or one of the following Advanced Practice Providers on your designated Care Team:    Chanetta Marshall, NP  Tommye Standard, PA-C  Legrand Como "Jonni Sanger" [place], [place]    Other Instructions  STAYING HYDRATED

## 2020-12-12 NOTE — Progress Notes (Signed)
Remote ICD transmission.   

## 2020-12-18 ENCOUNTER — Other Ambulatory Visit: Payer: Self-pay | Admitting: *Deleted

## 2020-12-18 ENCOUNTER — Telehealth: Payer: Self-pay | Admitting: *Deleted

## 2020-12-18 DIAGNOSIS — I5022 Chronic systolic (congestive) heart failure: Secondary | ICD-10-CM

## 2020-12-18 NOTE — Telephone Encounter (Signed)
Spoke with patient about Referral to heart failure clinic and  Stopping Coreg 3.141m which is also agreeable  by Dr. KCaryl Comesdue to her dizziness symptoms. Patient stated she is feeling well and haven't had any dizziness that has caused her to not be able to do her daily routine. Patient is awaiting appointment to be set up by heart failure clinic. Message [employer]

## 2020-12-20 ENCOUNTER — Encounter: Payer: 59 | Admitting: Physician Assistant

## 2021-01-10 ENCOUNTER — Encounter: Payer: Self-pay | Admitting: Adult Health

## 2021-01-10 ENCOUNTER — Ambulatory Visit: Payer: 59 | Admitting: Adult Health

## 2021-01-10 VITALS — BP 136/86 | HR 83 | Ht 65.0 in | Wt 180.0 lb

## 2021-01-10 DIAGNOSIS — R42 Dizziness and giddiness: Secondary | ICD-10-CM

## 2021-01-10 DIAGNOSIS — G43009 Migraine without aura, not intractable, without status migrainosus: Secondary | ICD-10-CM

## 2021-01-10 MED ORDER — EMGALITY 120 MG/ML ~~LOC~~ SOAJ: 120.0000 mg | SUBCUTANEOUS | 11 refills | Status: DC

## 2021-01-10 NOTE — Progress Notes (Signed)
PATIENT: Julie Saunders DOB: 1986/04/18  REASON FOR VISIT: follow up HISTORY FROM: patient  HISTORY OF PRESENT ILLNESS: Today 01/10/21:  Julie Saunders is a 35 year old female with a history of migraine headaches and vertigo.  She reports that her headaches have remained under good control with Emgality.  She states that she is not had a migraine since December 2020.  She has not had to use the sumatriptan.  At the last visit she was given a prescription of Valium to use for vertigo.  She reports that that she only has used 4 tablets.  She continues to get sometimes daily other times weekly episodes of vertigo.  She reports sometimes the vertigo is mild for the severe episode she has to use Valium.  She tries to do the exercises she learned from vestibular rehab but she does not do them consistently.  She returns today for evaluation.   07/12/20: Julie Saunders is a 35 year old with a history of migraine headaches and vertigo.  She returns today for she states that she has not had a headache since December 2020.  Reports that Emgality has been extremely beneficial for her.  She reports that her vertigo episode started to increase last month.  In the past she had a prescription of Valium from Dr. Alroy Dust.  She states that she did take a Valium and that was beneficial.  She is continue to try to do the vestibular exercises she learned in vestibular rehab however admits that she has not been consistent with these exercises.  She returns today for an evaluation.  HISTORY [name] reports good control of headaches with a lesser frequency , slightly less intensity and may be slightly shorter duration. Here for refills of Emgality- she keept a headache journal.  Thee were several month since may without any headaches !     Julie Saunders is a 35 year old female with a history of migraine headaches and vertigo.  She returns today for follow-up.  She states that her headaches have been controlled with  Emgality.  She states that she may have 1-2 headaches a month.  She states that there is been some months that she did have a headache at all.  Her headache location varies.  She does report photophobia and phonophobia with her migraines.  She also has nausea but no vomiting.  Denies any visual changes.  Denies any numbness and weakness in the extremities.  She reports that she does use sumatriptan with good benefit.  She states that she still has vertigo.  This typically occurs with position changes.  She states that she will have some form of vertigo every day however the severity does vary.  She did complete vestibular rehab in the past.  She reports that she did get some benefit but not resolution.  She has not continue doing the exercises at home.  She states that she also was given Valium in the past and that was beneficial as well.  She returns today for an evaluation.  REVIEW OF SYSTEMS: Out of a complete 14 system review of symptoms, the patient complains only of the following symptoms, and all other reviewed systems are negative.  See HPI  ALLERGIES: Allergies  Allergen Reactions  . Sulfa Antibiotics Rash  . Sulfacetamide Sodium Rash    HOME MEDICATIONS: Outpatient Medications Prior to Visit  Medication Sig Dispense Refill  . acetaminophen (TYLENOL) 325 MG tablet Take 650 mg by mouth every 6 (six) hours as needed for headache.    Marland Kitchen  budesonide (ENTOCORT EC) 3 MG 24 hr capsule Take 9 mg by mouth daily.    . cetirizine (ZYRTEC) 10 MG tablet Take 10 mg by mouth daily.    . Cholecalciferol (VITAMIN D3 PO) Take 50,000 Units by mouth once a week.    . Cholecalciferol (VITAMIN D3) 1.25 MG (50000 UT) CAPS Take 1 capsule by mouth once a week.    . Cobalamin Combinations (B-12) [phone] MCG SUBL Place 1 tablet under the tongue daily.    . diazepam (VALIUM) 5 MG tablet Take 1 tablet (5 mg total) by mouth every 12 (twelve) hours as needed (vertigpo). 10 tablet 0  . Galcanezumab-gnlm (EMGALITY)  120 MG/ML SOAJ Inject 120 mg into the skin every 30 (thirty) days. 1 mL 11  . Pediatric Multiple Vit-C-FA (ANIMAL CHEWABLE MULTIVITAMIN PO) Take 1 tablet by mouth daily.    . potassium chloride SA (KLOR-CON M20) 20 MEQ tablet Take 1 tablet (20 mEq total) by mouth daily. 90 tablet 3  . SUMAtriptan (IMITREX) 100 MG tablet Take 1 tablet (100 mg total) by mouth every 2 (two) hours as needed for migraine. May repeat in 2 hours if headache persists or recurs. 10 tablet 2  . ustekinumab (STELARA) 90 MG/ML SOSY injection Use as directed     No facility-administered medications prior to visit.    PAST MEDICAL HISTORY: Past Medical History:  Diagnosis Date  . 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 Allergy   . Bradycardia   . Cardiac arrest ([place]) 08/31/2014  . Crohn disease ([place])   . Depression   . History of blood transfusion 09/2012   "had allergic rxn to one of my Crohn's RX"   . Migraine    "a few times/yr" (06/08/2015)  . Pancytopenia ([place]) 11/23/2012  . Status post radiofrequency ablation for arrhythmia, atrial tach 06/08/15 06/08/2015  . SVT (supraventricular tachycardia) ([place]) 12/08/2014   likely an atrial tachycardia with CL 250 msec    PAST SURGICAL HISTORY: Past Surgical History:  Procedure Laterality Date  . ABLATION    . ELECTROPHYSIOLOGIC STUDY N/A 06/08/2015   Procedure: SVT Ablation;  Surgeon: Thompson Grayer, MD;  Location: [facility];  Service: Cardiovascular;  Laterality: N/A;  . ELECTROPHYSIOLOGY STUDY N/A 09/11/2014   Procedure: ELECTROPHYSIOLOGY STUDY;  Surgeon: Deboraha Sprang, MD;  Location: [facility] CATH LAB;  Service: Cardiovascular;  Laterality: N/A;  . IMPLANTABLE CARDIOVERTER DEFIBRILLATOR IMPLANT N/A 09/13/2014   Procedure: IMPLANTABLE CARDIOVERTER DEFIBRILLATOR IMPLANT;  Surgeon: Deboraha Sprang, MD;  Location: [facility] CATH LAB;  Service: Cardiovascular;  Laterality: N/A;; STJ  dual chamber ICD implanted by Dr Caryl Comes for aborted cardiac arrest    FAMILY HISTORY: Family History  Problem Relation Age of Onset  . Heart attack Mother   . Migraines Mother   . Heart disease Mother   . Heart attack Maternal Grandmother   . Heart disease Maternal Grandmother   . Diabetes Maternal Grandfather   . Asthma Paternal Grandmother   . Diabetes Paternal Grandfather     SOCIAL HISTORY: Social History   Socioeconomic History  . Marital status: Single    Spouse name: Not on file  . Number of children: Not on file  . Years of education: Not on file  . Highest education level: Not on file  Occupational History  . Not on file  Tobacco Use  . Smoking status: Never Smoker  . Smokeless tobacco: Never Used  Vaping  Use  . Vaping Use: Never used  Substance and Sexual Activity  . Alcohol use: Yes    Comment: occasional/social  . Drug use: No  . Sexual activity: Never  Other Topics Concern  . Not on file  Social History Narrative  . Not on file   Social Determinants of Health   Financial Resource Strain: Not on file  Food Insecurity: Not on file  Transportation Needs: Not on file  Physical Activity: Not on file  Stress: Not on file  Social Connections: Not on file  Intimate Partner Violence: Not on file      PHYSICAL EXAM  Vitals:   01/10/21 1427  BP: 136/86  Pulse: 83  Weight: 180 lb (81.6 kg)  Height: 5' 5"  (1.651 m)   Body mass index is 29.95 kg/m.  Generalized: Well developed, in no acute distress   Neurological examination  Mentation: Alert oriented to time, place, history taking. Follows all commands speech and language fluent Cranial nerve II-XII: Pupils were equal round reactive to light. Extraocular movements were full, visual field were full on confrontational test.  Horizontal End beat nystagmus noted. Head turning and shoulder shrug  were normal and symmetric. Motor: The motor testing reveals 5 over 5 strength of all 4 extremities. Good  symmetric motor tone is noted throughout.  Sensory: Sensory testing is intact to soft touch on all 4 extremities. No evidence of extinction is noted.  Coordination: Cerebellar testing reveals good finger-nose-finger and heel-to-shin bilaterally.  Gait and station: Gait is normal.  Reflexes: Deep tendon reflexes are symmetric and normal bilaterally.   DIAGNOSTIC DATA (LABS, IMAGING, TESTING) - I reviewed patient records, labs, notes, testing and imaging myself where available.  Lab Results  Component Value Date   WBC 10.5 09/22/2018   HGB 12.2 09/22/2018   HCT 37.1 09/22/2018   MCV 84 09/22/2018   PLT 402 09/22/2018      Component Value Date/Time   NA 142 09/22/2018 1532   NA 140 02/24/2013 1029   K 4.3 09/22/2018 1532   K 3.4 (L) 02/24/2013 1029   CL 104 09/22/2018 1532   CL 102 02/24/2013 1029   CO2 24 09/22/2018 1532   CO2 21 (L) 02/24/2013 1029   GLUCOSE 81 09/22/2018 1532   GLUCOSE 82 11/06/2016 1330   GLUCOSE 81 02/24/2013 1029   BUN 15 09/22/2018 1532   BUN 6.9 (L) 02/24/2013 1029   CREATININE 1.01 (H) 09/22/2018 1532   CREATININE 0.89 08/26/2016 1615   CREATININE 0.8 02/24/2013 1029   CALCIUM 9.0 09/22/2018 1532   CALCIUM 9.9 02/24/2013 1029   PROT 6.6 09/22/2018 1532   PROT 8.7 (H) 02/24/2013 1029   ALBUMIN 3.4 (L) 09/22/2018 1532   ALBUMIN 3.7 02/24/2013 1029   AST 9 09/22/2018 1532   AST 13 02/24/2013 1029   ALT 10 09/22/2018 1532   ALT 9 02/24/2013 1029   ALKPHOS 78 09/22/2018 1532   ALKPHOS 57 02/24/2013 1029   BILITOT <0.2 09/22/2018 1532   BILITOT 0.39 02/24/2013 1029   GFRNONAA 74 09/22/2018 1532   GFRAA 86 09/22/2018 1532    Lab Results  Component Value Date   TSH 3.772 08/21/2016      ASSESSMENT AND PLAN 35 y.o. year old female  has a past medical history of AICD (automatic cardioverter/defibrillator) present (09/14/2015), Allergy, Bradycardia, Cardiac arrest ([place]) (08/31/2014), Crohn disease ([place]), Depression, History of blood transfusion  (09/2012), Migraine, Pancytopenia ([place]) (11/23/2012), Status post radiofrequency ablation for arrhythmia, atrial tach 06/08/15 (06/08/2015), and SVT (  supraventricular tachycardia) ([place]) (12/08/2014). here with:  Migraine headaches   Continue Emgality  Advised to let us know if her headache frequency increases  Vertigo   Referral to vestibular rehab  Can refill Valium in the future if needed  She will follow-up in 6 months or sooner if needed  I spent 30 minutes of face-to-face and non-face-to-face time with patient.  This included previsit chart review, lab review, study review, order entry, electronic health record documentation, patient education.  Ward Givens, MSN, NP-C 01/10/2021, 2:27 PM [facility] [address] [place], [place] [address] [phone]

## 2021-01-10 NOTE — Patient Instructions (Signed)
Your Plan:  Continue Emgality  Referral to vestibular rehab for vertigo If your symptoms worsen or you develop new symptoms please let us know.    Thank you for coming to see [place] at [facility]. I hope we have been able to provide you high quality care today.  You may receive a patient satisfaction survey over the next few weeks. We would appreciate your feedback and comments so that we may continue to improve ourselves and the health of our patients.

## 2021-01-25 ENCOUNTER — Ambulatory Visit: Payer: 59 | Attending: Adult Health

## 2021-01-25 ENCOUNTER — Other Ambulatory Visit: Payer: Self-pay

## 2021-01-25 DIAGNOSIS — R42 Dizziness and giddiness: Secondary | ICD-10-CM

## 2021-01-25 DIAGNOSIS — R2681 Unsteadiness on feet: Secondary | ICD-10-CM

## 2021-01-25 NOTE — Therapy (Signed)
[facility] [address] [place], [place], [address] Phone: [phone]   Fax:  [phone]  Physical Therapy Evaluation  Patient Details  Name: DEMETRIS MEINHARDT MRN: [id number] Date of Birth: 1986/09/21 Referring Provider (PT): Ward Givens, NP   Encounter Date: 01/25/2021   PT End of Session - 01/25/21 1527    Visit Number 1    Number of Visits 9    Date for PT Re-Evaluation 03/22/21   POC for 8 weeks   Authorization Type UHC (VL: 90)    PT Start Time 1528    PT Stop Time 1612    PT Time Calculation (min) 44 min    Activity Tolerance Patient tolerated treatment well    Behavior During Therapy [facility] for tasks assessed/performed           Past Medical History:  Diagnosis Date  . 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 Allergy   . Bradycardia   . Cardiac arrest ([place]) 08/31/2014  . Crohn disease ([place])   . Depression   . History of blood transfusion 09/2012   "had allergic rxn to one of my Crohn's RX"   . Migraine    "a few times/yr" (06/08/2015)  . Pancytopenia ([place]) 11/23/2012  . Status post radiofrequency ablation for arrhythmia, atrial tach 06/08/15 06/08/2015  . SVT (supraventricular tachycardia) ([place]) 12/08/2014   likely an atrial tachycardia with CL 250 msec    Past Surgical History:  Procedure Laterality Date  . ABLATION    . ELECTROPHYSIOLOGIC STUDY N/A 06/08/2015   Procedure: SVT Ablation;  Surgeon: Thompson Grayer, MD;  Location: [facility];  Service: Cardiovascular;  Laterality: N/A;  . ELECTROPHYSIOLOGY STUDY N/A 09/11/2014   Procedure: ELECTROPHYSIOLOGY STUDY;  Surgeon: Deboraha Sprang, MD;  Location[facility] CATH LAB;  Service: Cardiovascular;  Laterality: N/A;  . IMPLANTABLE CARDIOVERTER DEFIBRILLATOR IMPLANT N/A 09/13/2014   Procedure: IMPLANTABLE CARDIOVERTER DEFIBRILLATOR IMPLANT;  Surgeon: Deboraha Sprang, MD;  Location: [facility] CATH LAB;  Service: Cardiovascular;  Laterality: N/A;; STJ dual chamber ICD implanted by Dr Caryl Comes for aborted cardiac arrest    There were no vitals filed for this visit.    Subjective Assessment - 01/25/21 1531    Subjective Patient reports that she has dizziness/vertigo for years, believes it started in 2019. Patient has been to [facility] rehab ([facility]) before that was beneficial. Patient reports that she had good/bad days. Some days are worse than others and she has to take valium. Patient reports quick head/body movements, weather, sinuses are triggers for vertigo. Patient reports that she does have lightheadedness, but also intermittent spinning. Denies vision/hearing. Occasional ringing in the ears. Migraine are under control, has not had one since 2020. Depending upon how severe the dizziness is, it does limit daily activities.    Pertinent History Migraine, Vertigo, Orthostatics, AICD, Hx of Cardiac Arrest, Crohns Disease, Depression, Bradycardia    Limitations Standing;Walking    Patient Stated Goals Resolve the Dizziness; Be able to manage the dizziness.    Currently in Pain? No/denies              [facility] PT Assessment - 01/25/21 0001      Assessment   Medical Diagnosis Vertigo    Referring Provider (PT) Ward Givens, NP    Onset Date/Surgical Date 01/10/21    Prior Therapy Prior [facility] Re[facility] at [facility]      Precautions  Precautions Other (comment)   AICD     Restrictions   Weight Bearing Restrictions No      Balance Screen   Has the patient fallen in the past 6 months No    Has the patient had a decrease in activity level because of a fear of falling?  No    Is the patient reluctant to leave their home because of a fear of falling?  No      Home Social worker Private residence    Living Arrangements Parent    Available Help at Discharge Family    Type of [place]      Prior Function    Level of Independence Independent    Leisure Read Watch/Movies; Crafts      Cognition   Overall Cognitive Status Within Functional Limits for tasks assessed      Observation/Other Assessments   Focus on Therapeutic Outcomes (FOTO)  DPS: 45; DFS: 52.3      Sensation   Light Touch Appears Intact      ROM / Strength   AROM / PROM / Strength Strength      Strength   Overall Strength Within functional limits for tasks performed      Transfers   Transfers Sit to Stand;Stand to Sit    Sit to Stand 7: Independent    Stand to Sit 7: Independent    Comments increased dizziness with sit <> stand      Ambulation/Gait   Ambulation/Gait Yes    Ambulation/Gait Assistance 7: Independent    Assistive device None    Gait Pattern Within Functional Limits    Ambulation Surface Level;Indoor              Vestibular Assessment - 01/25/21 0001      Symptom Behavior   Subjective history of current problem Patient reports history of dizziness for multiple years, intermittent lightheadedness and spinning at times. Reports intermittent tinnitus; denies vision changes.    Type of Dizziness  Lightheadedness;Spinning;Comment;Vertigo;Unsteady with head/body turns   reports pressure in L ear; feels as she gravites toward her L side   Frequency of Dizziness daily    Duration of Dizziness hours to days    Symptom Nature Motion provoked;Intermittent    Aggravating Factors Activity in general;Turning head quickly;Supine to sit;Sit to stand;Lying supine;Forward bending    Relieving Factors Slow movements;Rest;Head stationary    Progression of Symptoms No change since onset      Oculomotor Exam   Oculomotor Alignment Normal    Ocular ROM WNL    Spontaneous Absent    Gaze-induced  Absent    Smooth Pursuits Comment   jerky eye movements   Saccades --   reports  mild dizziness with completion   Comment Normal Cover Uncover Test      Oculomotor Exam-Fixation Suppressed    Left Head Impulse Corrective  Saccade Noted    Right Head Impulse Normal      Vestibulo-Ocular Reflex   VOR 1 Head Only (x 1 viewing) mild dizziness, able to maintain gaze    VOR Cancellation Normal      Visual Acuity   Static 9    Dynamic 7      Other Tests   Comments VBI Screen: Negative      Positional Testing   Dix-Hallpike Dix-Hallpike Right;Dix-Hallpike Left    Horizontal Canal Testing Horizontal Canal Right;Horizontal Canal Left      Dix-Hallpike Right   Dix-Hallpike Right Duration 0  Dix-Hallpike Right Symptoms No nystagmus      Dix-Hallpike Left   Dix-Hallpike Left Duration 0    Dix-Hallpike Left Symptoms No nystagmus      Horizontal Canal Right   Horizontal Canal Right Duration 0    Horizontal Canal Right Symptoms Normal      Horizontal Canal Left   Horizontal Canal Left Duration 0      Positional Sensitivities   Sit to Supine No dizziness    Supine to Left Side Lightheadedness    Supine to Right Side Lightheadedness    Supine to Sitting Mild dizziness    Right Hallpike No dizziness    Up from Right Hallpike Lightheadedness    Up from Left Hallpike Mild dizziness    Nose to Right Knee Lightheadedness    Right Knee to Sitting Mild dizziness    Nose to Left Knee Lightheadedness    Left Knee to Sitting Mild dizziness    Head Turning x 5 Mild dizziness    Head Nodding x 5 Mild dizziness   more with vertical > horizontal   Pivot Right in Standing No dizziness    Pivot Left in Standing No dizziness    Rolling Right No dizziness    Rolling Left No dizziness    Positional Sensitivities Comments intermittent reports of lightheadedness              Objective measurements completed on examination: See above findings.       PT Education - 01/25/21 1531    Education Details Educated on [employer]) Educated Patient    Methods Explanation    Comprehension Verbalized understanding            PT Short Term Goals - 01/25/21 1620      PT SHORT TERM  GOAL #1   Title Patient will be independent with inital vestibular/balance HEP (ALL STGS Due: 02/22/21)    Baseline no HEP established    Time 4    Period Weeks    Status New    Target Date 02/22/21      PT SHORT TERM GOAL #2   Title Patient will undergo further vestibular/balance assesment (Orthostatics/SOT) and LTG to be set as appropraite    Baseline TBA    Time 4    Period Weeks    Status New             PT Long Term Goals - 01/25/21 1621      PT LONG TERM GOAL #1   Title Patient will be independent with final vestibular/balance HEP (ALL LTGs Due: 03/22/21)    Baseline no HEP established    Time 8    Period Weeks    Status New    Target Date 03/22/21      PT LONG TERM GOAL #2   Title Pt will report increase DPS to >/= 50% and DFS >/= 55 on FOTO    Baseline DPS: 45; DFS: 52.3    Time 8    Period Weeks    Status New      PT LONG TERM GOAL #3   Title LTG to be set for SOT as appropriate    Baseline TBA    Time 8    Period Weeks    Status New      PT LONG TERM GOAL #4   Title Patient will improve report 50% improvement in dizziness with functional mobility including supine <> sit, sit <> stand, and with head  turns.    Baseline mild dizziness reported with functional movements    Time 8    Period Weeks    Status New                  Plan - 01/25/21 1625    Clinical Impression Statement Patient is a 35 y.o. female referred to Neuro OPPT for Vertigo. Patient's PMH significant for the following: Migraine, Vertigo, Orthostatics, AICD, Hx of Cardiac Arrest, Crohns Disease, Depression, Bradycardia. Upon evaluation patient presents with the following impairments: Dizziness, impaired VOR, increased motion sensitivity, impaired balance, and potential orthostatic hypotension. Full orthostatic assesment to be assesed at next session. Patient will benefit from skilled PT services to address the following impairments listed above to allow for improved activity tolerance  for functional movements/activities.    Personal Factors and Comorbidities Comorbidity 3+;Time since onset of injury/illness/exacerbation    Comorbidities Patient is a 35 y.o. female referred to Neuro OPPT for Vertigo. Patient's PMH significant for the following: Migraine, Vertigo, Orthostatics, AICD, Hx of Cardiac Arrest, Crohns Disease, Depression, Bradycardia. Upon evaluation patient presents with the following impairments: Dizziness,   Patient will benefit from skilled PT services to address the following impairments    Examination-Activity Limitations [place];Bed Mobility;Stand    Examination-Participation Restrictions Cleaning;Community Activity    Stability/Clinical Decision Making Stable/Uncomplicated    Clinical Decision Making Low    Rehab Potential Fair    PT Frequency 1x / week    PT Duration 8 weeks    PT Treatment/Interventions ADLs/Self Care Home Management;Aquatic Therapy;Canalith Repostioning;Cryotherapy;Electrical Stimulation;Moist Heat;Gait training;Stair training;Functional mobility training;Therapeutic activities;Therapeutic exercise;Balance training;Neuromuscular re-education;Patient/family education;Dry needling;Passive range of motion;Vestibular;Manual techniques    PT Next Visit Plan Complete Full Orthostatic Assesment; Complete SOT and update Goal. Begin Vestibular HEP focused on habituation and VOR x 1    Consulted and Agree with Plan of Care Patient           Patient will benefit from skilled therapeutic intervention in order to improve the following deficits and impairments:  Dizziness,Decreased activity tolerance,Decreased balance  Visit Diagnosis: Dizziness and giddiness - Plan: PT plan of care cert/re-cert  Unsteadiness on feet - Plan: PT plan of care cert/re-cert     Problem List Patient Active Problem List   Diagnosis Date Noted  . 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 Dual implantable cardioverter-defibrillator in situ 09/22/2018  . Crohn's disease  of both small and large intestine without complication ([place]) 72/[id number]/0355  . Status post radiofrequency ablation for arrhythmia, atrial tach 06/08/15 06/08/2015  . SVT (supraventricular tachycardia) ([place]) 04/16/2015  . Defibrillator discharge   . VT (ventricular tachycardia) ([place])   . Chronic systolic heart failure ([place]) 09/14/2014  . Ventricular fibrillation ([place]) 09/11/2014  . Sinus bradycardia 09/11/2014  . Cardiomyopathy ([place]) 09/11/2014  . Acute renal failure ([place]) 09/08/2014  . Crohn's duodenitis ([place]) 09/08/2014  . Prolonged QT interval 09/08/2014  . Anemia 09/08/2014  . 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 Cardiac arrest ([place]) 08/31/2014  . Hypokalemia 08/31/2014  . Acute respiratory failure with hypoxia ([place]) 08/31/2014  . Pancytopenia ([place]) 11/23/2012    Jones Bales, PT, DPT 01/25/2021, 4:32 PM  [facility] [address] [place], [place], [address] Phone: [phone]   Fax:  [phone]  Name: NELLIE PESTER MRN: [id number] Date of Birth: Jul 20, 1986

## 2021-01-31 ENCOUNTER — Other Ambulatory Visit: Payer: Self-pay

## 2021-01-31 ENCOUNTER — Ambulatory Visit: Payer: 59

## 2021-01-31 VITALS — BP 82/60

## 2021-01-31 DIAGNOSIS — R42 Dizziness and giddiness: Secondary | ICD-10-CM

## 2021-01-31 DIAGNOSIS — R2681 Unsteadiness on feet: Secondary | ICD-10-CM

## 2021-01-31 NOTE — Therapy (Signed)
[facility] [address] [place], [place], [address] Phone: [phone]   Fax:  [phone]  Physical Therapy Treatment  Patient Details  Name: Julie Saunders MRN: [id number] Date of Birth: [birth year] Referring Provider (PT): Ward Givens, NP   Encounter Date: 01/31/2021   PT End of Session - 01/31/21 1318    Visit Number 2    Number of Visits 9    Date for PT Re-Evaluation 03/22/21   POC for 8 weeks   Authorization Type UHC (VL: 90)    PT Start Time 8032    PT Stop Time 1400    PT Time Calculation (min) 43 min    Activity Tolerance Patient tolerated treatment well    Behavior During Therapy [facility] for tasks assessed/performed           Past Medical History:  Diagnosis Date  . 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 Allergy   . Bradycardia   . Cardiac arrest ([place]) 08/31/2014  . Crohn disease ([place])   . Depression   . History of blood transfusion 09/2012   "had allergic rxn to one of my Crohn's RX"   . Migraine    "a few times/yr" (06/08/2015)  . Pancytopenia ([place]) 11/23/2012  . Status post radiofrequency ablation for arrhythmia, atrial tach 06/08/15 06/08/2015  . SVT (supraventricular tachycardia) ([place]) 12/08/2014   likely an atrial tachycardia with CL 250 msec    Past Surgical History:  Procedure Laterality Date  . ABLATION    . ELECTROPHYSIOLOGIC STUDY N/A 06/08/2015   Procedure: SVT Ablation;  Surgeon: Thompson Grayer, MD;  Location: [place] CV LAB;  Service: Cardiovascular;  Laterality: N/A;  . ELECTROPHYSIOLOGY STUDY N/A 09/11/2014   Procedure: ELECTROPHYSIOLOGY STUDY;  Surgeon: Deboraha Sprang, MD;  Location: [facility] CATH LAB;  Service: Cardiovascular;  Laterality: N/A;  . IMPLANTABLE CARDIOVERTER DEFIBRILLATOR IMPLANT N/A 09/13/2014   Procedure: IMPLANTABLE CARDIOVERTER DEFIBRILLATOR IMPLANT;  Surgeon: Deboraha Sprang, MD;  Location: [facility] CATH LAB;  Service: Cardiovascular;  Laterality: N/A;; STJ dual chamber ICD implanted by Dr Caryl Comes for aborted cardiac arrest    Vitals:   01/31/21 1321 01/31/21 1322 01/31/21 1324  BP: 90/60 (!) 84/60 (!) 82/60     Subjective Assessment - 01/31/21 1318    Subjective Pt reports that the day after the eval she was feeling dizzy and sick. Did not have to take a valium but yesterday she did. Feeling a little better today.    Pertinent History Migraine, Vertigo, Orthostatics, AICD, Hx of Cardiac Arrest, Crohns Disease, Depression, Bradycardia    Limitations Standing;Walking    Patient Stated Goals Resolve the Dizziness; Be able to manage the dizziness.    Currently in Pain? 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 Pt has SOM preference             Conditions: 1: 3/3 trials 2: 2/3 trials  3:  2/3 trials 4: 0/3 5: 0/3 6: 0/3 with one fall Composite score: 51 Sensory Analysis Som: WNL Vis: Below normal Vest: Below normal Pref:  Strategy analysis: ankle strategy COG alignment: Pt WNL         [facility] Adult PT Treatment/Exercise - 01/31/21 1319  Ambulation/Gait   Ambulation/Gait Yes    Ambulation/Gait Assistance 7: Independent    Assistive device None           Vestibular Treatment/Exercise - 01/31/21 1346      Vestibular Treatment/Exercise   Gaze Exercises X1 Viewing Horizontal;X1 Viewing Vertical      X1 Viewing Horizontal   Foot Position seated    Comments 30 sec x 2. Pt reported dizziness 5/10 once stopped that lasted about 30 sec.      X1 Viewing Vertical   Foot Position seated    Comments 30 sec x 2 with 5/10 dizziness once stopped the first time and 3/10 the 2nd time.                 PT Education - 01/31/21 1817    Education Details Started on gaze stabilization HEP.  Discussed results of SOT testing.    Person(s) Educated Patient    Methods Explanation;Demonstration;Handout    Comprehension Verbalized understanding;Returned demonstration            PT Short Term Goals - 01/31/21 1820      PT SHORT TERM GOAL #1   Title Patient will be independent with inital vestibular/balance HEP (ALL STGS Due: 02/22/21)    Baseline no HEP established    Time 4    Period Weeks    Status New    Target Date 02/22/21      PT SHORT TERM GOAL #2   Title Patient will undergo further vestibular/balance assesment (Orthostatics/SOT) and LTG to be set as appropraite    Baseline 01/31/21 Pt was not orthostatic at visit. SOT was performed.    Time 4    Period Weeks    Status Achieved             PT Long Term Goals - 01/31/21 1832      PT LONG TERM GOAL #1   Title Patient will be independent with final vestibular/balance HEP (ALL LTGs Due: 03/22/21)    Baseline no HEP established    Time 8    Period Weeks    Status New      PT LONG TERM GOAL #2   Title Pt will report increase DPS to >/= 50% and DFS >/= 55 on FOTO    Baseline DPS: 45; DFS: 52.3    Time 8    Period Weeks    Status New      PT LONG TERM GOAL #3   Title Pt will increase composite score to >60 for improved visual and vestibular function.    Baseline 01/31/21 51    Time 8    Period Weeks    Status New      PT LONG TERM GOAL #4   Title Patient will improve report 50% improvement in dizziness with functional mobility including supine <> sit, sit <> stand, and with head turns.    Baseline mild dizziness reported with functional movements    Time 8    Period Weeks    Status New                 Plan - 01/31/21 1821    Clinical Impression Statement Pt was not orthostatic at visit but BP was running low. Performed SOT testing and pt has a somatosensory preference. She was challenged when forced to rely more on vision and vestibular symptoms.    Personal Factors and Comorbidities  Comorbidity 3+;Time since onset of injury/illness/exacerbation    Comorbidities Patient is a  35 y.o. female referred to Neuro OPPT for Vertigo. Patient's PMH significant for the following: Migraine, Vertigo, Orthostatics, AICD, Hx of Cardiac Arrest, Crohns Disease, Depression, Bradycardia. Upon evaluation patient presents with the following impairments: Dizziness,   Patient will benefit from skilled PT services to address the following impairments    Examination-Activity Limitations Bend;Bed Mobility;Stand    Examination-Participation Restrictions Cleaning;Community Activity    Stability/Clinical Decision Making Stable/Uncomplicated    Rehab Potential Fair    PT Frequency 1x / week    PT Duration 8 weeks    PT Treatment/Interventions ADLs/Self Care Home Management;Aquatic Therapy;Canalith Repostioning;Cryotherapy;Electrical Stimulation;Moist Heat;Gait training;Stair training;Functional mobility training;Therapeutic activities;Therapeutic exercise;Balance training;Neuromuscular re-education;Patient/family education;Dry needling;Passive range of motion;Vestibular;Manual techniques    PT Next Visit Plan Progress Vestibular HEP focused on habituation and VOR x 1. How is initial HEP going with gaze stabilization. Work on balance on compliant surfaces to work visual and vestibular systems more.    Consulted and Agree with Plan of Care Patient           Patient will benefit from skilled therapeutic intervention in order to improve the following deficits and impairments:  Dizziness,Decreased activity tolerance,Decreased balance  Visit Diagnosis: Unsteadiness on feet  Dizziness and giddiness     Problem List Patient Active Problem List   Diagnosis Date Noted  . 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 Dual implantable cardioverter-defibrillator in situ 09/22/2018  . Crohn's disease of both small and large intestine without complication ([place]) 41/58/3094  . Status post radiofrequency ablation for  arrhythmia, atrial tach 06/08/15 06/08/2015  . SVT (supraventricular tachycardia) ([place]) 04/16/2015  . Defibrillator discharge   . VT (ventricular tachycardia) ([place])   . Chronic systolic heart failure ([place]) 09/14/2014  . Ventricular fibrillation ([place]) 09/11/2014  . Sinus bradycardia 09/11/2014  . Cardiomyopathy ([place]) 09/11/2014  . Acute renal failure ([place]) 09/08/2014  . Crohn's duodenitis ([place]) 09/08/2014  . Prolonged QT interval 09/08/2014  . Anemia 09/08/2014  . 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 Cardiac arrest ([place]) 08/31/2014  . Hypokalemia 08/31/2014  . Acute respiratory failure with hypoxia ([place]) 08/31/2014  . Pancytopenia ([place]) 11/23/2012    Electa Sniff, PT, DPT, NCS 01/31/2021, 6:33 PM  [facility] [address] [place], [place], [address] Phone: [phone]   Fax:  [phone]  Name: Julie Saunders MRN: [id number] Date of Birth: 02-13-1986

## 2021-01-31 NOTE — Patient Instructions (Signed)
Access Code: [email or web address] URL: [email or web address] Date: 01/31/2021 Prepared by: Cherly Anderson  Exercises Seated Gaze Stabilization with Head Rotation - 2 x daily - 7 x weekly - 1 sets - 3 reps - 30 sec hold Seated Gaze Stabilization with Head Nod - 2 x daily - 7 x weekly - 1 sets - 3 reps - 30 sec hold

## 2021-02-01 ENCOUNTER — Telehealth: Payer: Self-pay | Admitting: Physician Assistant

## 2021-02-01 NOTE — Telephone Encounter (Signed)
Patient states she is still waiting on a referral to heart failure clinic. She states she has called 3 times and the last time was on Tuesday and has not heard back. She states it has been 2 months she has been waiting. She states she would like a call back.

## 2021-02-06 NOTE — Telephone Encounter (Signed)
Pt wanted to let us know that she has an appointment at the [facility] 03/11/21

## 2021-02-07 ENCOUNTER — Other Ambulatory Visit: Payer: Self-pay

## 2021-02-07 ENCOUNTER — Ambulatory Visit: Payer: 59

## 2021-02-07 DIAGNOSIS — R42 Dizziness and giddiness: Secondary | ICD-10-CM | POA: Diagnosis not present

## 2021-02-07 DIAGNOSIS — R2681 Unsteadiness on feet: Secondary | ICD-10-CM

## 2021-02-07 NOTE — Therapy (Signed)
[facility] [address] [place], [place], [address] Phone: [phone]   Fax:  [phone]  Physical Therapy Treatment  Patient Details  Name: Julie Saunders MRN: [id number] Date of Birth: 1985-12-04 Referring Provider (PT): Ward Givens, NP   Encounter Date: 02/07/2021   PT End of Session - 02/07/21 1444    Visit Number 3    Number of Visits 9    Date for PT Re-Evaluation 03/22/21    Authorization Type UHC (VL: 90)    PT Start Time 1401    PT Stop Time 1441    PT Time Calculation (min) 40 min    Equipment Utilized During Treatment Other (comment)   S prn   Activity Tolerance Patient tolerated treatment well    Behavior During Therapy [facility] for tasks assessed/performed           Past Medical History:  Diagnosis Date  . 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 Allergy   . Bradycardia   . Cardiac arrest ([place]) 08/31/2014  . Crohn disease ([place])   . Depression   . History of blood transfusion 09/2012   "had allergic rxn to one of my Crohn's RX"   . Migraine    "a few times/yr" (06/08/2015)  . Pancytopenia ([place]) 11/23/2012  . Status post radiofrequency ablation for arrhythmia, atrial tach 06/08/15 06/08/2015  . SVT (supraventricular tachycardia) ([place]) 12/08/2014   likely an atrial tachycardia with CL 250 msec    Past Surgical History:  Procedure Laterality Date  . ABLATION    . ELECTROPHYSIOLOGIC STUDY N/A 06/08/2015   Procedure: SVT Ablation;  Surgeon: Thompson Grayer, MD;  Location: [place] CV LAB;  Service: Cardiovascular;  Laterality: N/A;  . ELECTROPHYSIOLOGY STUDY N/A 09/11/2014   Procedure: ELECTROPHYSIOLOGY STUDY;  Surgeon: Deboraha Sprang, MD;  Location: [facility] CATH LAB;  Service: Cardiovascular;  Laterality: N/A;  . IMPLANTABLE CARDIOVERTER DEFIBRILLATOR IMPLANT N/A 09/13/2014   Procedure: IMPLANTABLE  CARDIOVERTER DEFIBRILLATOR IMPLANT;  Surgeon: Deboraha Sprang, MD;  Location: [facility] CATH LAB;  Service: Cardiovascular;  Laterality: N/A;; STJ dual chamber ICD implanted by Dr Caryl Comes for aborted cardiac arrest    There were no vitals filed for this visit.   Subjective Assessment - 02/07/21 1404    Subjective Pt reported dizziness has been better since last visit, on average 3-4/10 vs. 5-10/10 prior to PT. She reported completing HEP at least once a day.    Pertinent History Migraine, Vertigo, Orthostatics, AICD, Hx of Cardiac Arrest, Crohns Disease, Depression, Bradycardia    Patient Stated Goals Resolve the Dizziness; Be able to manage the dizziness.    Currently in Pain? No/denies                NMR: Access Code: [email or web address] URL: [email or web address] Date: 02/07/2021 Prepared by: Geoffry Paradise  Exercises: Pt also performed previous HEP: seated x1 viewing (vertical and horizontal) x30 sec. Each with no incr. In dizziness and no cues for technique. Standing Gaze Stabilization with Head Rotation - 2 x daily - 7 x weekly - 1 sets - 3 reps - 30 hold Standing Gaze Stabilization with Head Nod - 2 x daily - 7 x weekly - 1 sets - 3 reps - 30 hold Seated Nose to Right Knee Vestibular Habituation - 2 x daily - 7 x weekly - 2-3 sets - 5 reps Seated Nose to Left Knee Vestibular Habituation - 2 x daily -  7 x weekly - 2-3 sets - 5 reps Feet together with head nods and head turns ON FOAM PAD - 1 x daily - 7 x weekly - 2 sets - 5 reps Romberg Stance Eyes Closed on Foam Pad - 1 x daily - 5 x weekly - 1 sets - 3 reps - 10-30 hold  Cues for cues and S prn to ensure safety. All balance activities performed in corner with chair in front of pt for safety.                      PT Education - 02/07/21 1443    Education Details PT provided pt with orthostatic education, due to BP decr. (although not significantly) during position changes. Her BP runs low 80s/60s. PT also  progressed and added to HEP.    Person(s) Educated Patient    Methods Explanation;Demonstration;Verbal cues;Handout    Comprehension Returned demonstration;Verbalized understanding            PT Short Term Goals - 01/31/21 1820      PT SHORT TERM GOAL #1   Title Patient will be independent with inital vestibular/balance HEP (ALL STGS Due: 02/22/21)    Baseline no HEP established    Time 4    Period Weeks    Status New    Target Date 02/22/21      PT SHORT TERM GOAL #2   Title Patient will undergo further vestibular/balance assesment (Orthostatics/SOT) and LTG to be set as appropraite    Baseline 01/31/21 Pt was not orthostatic at visit. SOT was performed.    Time 4    Period Weeks    Status Achieved             PT Long Term Goals - 01/31/21 1832      PT LONG TERM GOAL #1   Title Patient will be independent with final vestibular/balance HEP (ALL LTGs Due: 03/22/21)    Baseline no HEP established    Time 8    Period Weeks    Status New      PT LONG TERM GOAL #2   Title Pt will report increase DPS to >/= 50% and DFS >/= 55 on FOTO    Baseline DPS: 45; DFS: 52.3    Time 8    Period Weeks    Status New      PT LONG TERM GOAL #3   Title Pt will increase composite score to >60 for improved visual and vestibular function.    Baseline 01/31/21 51    Time 8    Period Weeks    Status New      PT LONG TERM GOAL #4   Title Patient will improve report 50% improvement in dizziness with functional mobility including supine <> sit, sit <> stand, and with head turns.    Baseline mild dizziness reported with functional movements    Time 8    Period Weeks    Status New                 Plan - 02/07/21 1444    Clinical Impression Statement Pt demonstrated progress as she reported dizziness has decr. since last session and pt was able to tolerate progression of x1 viewing HEP. Pt demonstrated progress during session, as she reported dizziness decr. during habituation  activities. Pt experienced incr. postural sway during balance activities which required incr. vestibular input. Pt would continue to benefit from skilled PT to improve dizziness and safety  during functional mobility.    Personal Factors and Comorbidities Comorbidity 3+;Time since onset of injury/illness/exacerbation    Comorbidities Patient is a 35 y.o. female referred to Neuro OPPT for Vertigo. Patient's PMH significant for the following: Migraine, Vertigo, Orthostatics, AICD, Hx of Cardiac Arrest, Crohns Disease, Depression, Bradycardia. Upon evaluation patient presents with the following impairments: Dizziness,   Patient will benefit from skilled PT services to address the following impairments    Examination-Activity Limitations Bend;Bed Mobility;Stand    Examination-Participation Restrictions Cleaning;Community Activity    Stability/Clinical Decision Making Stable/Uncomplicated    Rehab Potential Fair    PT Frequency 1x / week    PT Duration 8 weeks    PT Treatment/Interventions ADLs/Self Care Home Management;Aquatic Therapy;Canalith Repostioning;Cryotherapy;Electrical Stimulation;Moist Heat;Gait training;Stair training;Functional mobility training;Therapeutic activities;Therapeutic exercise;Balance training;Neuromuscular re-education;Patient/family education;Dry needling;Passive range of motion;Vestibular;Manual techniques    PT Next Visit Plan Ask how progressed HEP is going. Continue to work on balance on compliant surfaces to work visual and vestibular systems more.    PT [place]: [id number]    Consulted and Agree with Plan of Care Patient           Patient will benefit from skilled therapeutic intervention in order to improve the following deficits and impairments:  Dizziness,Decreased activity tolerance,Decreased balance  Visit Diagnosis: Dizziness and giddiness  Unsteadiness on feet     Problem List Patient Active Problem List   Diagnosis Date Noted  .  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 Dual implantable cardioverter-defibrillator in situ 09/22/2018  . Crohn's disease of both small and large intestine without complication ([place]) 05/03/7627  . Status post radiofrequency ablation for arrhythmia, atrial tach 06/08/15 06/08/2015  . SVT (supraventricular tachycardia) ([place]) 04/16/2015  . Defibrillator discharge   . VT (ventricular tachycardia) ([place])   . Chronic systolic heart failure ([place]) 09/14/2014  . Ventricular fibrillation ([place]) 09/11/2014  . Sinus bradycardia 09/11/2014  . Cardiomyopathy ([place]) 09/11/2014  . Acute renal failure ([place]) 09/08/2014  . Crohn's duodenitis ([place]) 09/08/2014  . Prolonged QT interval 09/08/2014  . Anemia 09/08/2014  . 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 Cardiac arrest ([place]) 08/31/2014  . Hypokalemia 08/31/2014  . Acute respiratory failure with hypoxia ([place]) 08/31/2014  . Pancytopenia ([place]) 11/23/2012    Isaack Preble L 02/07/2021, 2:47 PM  [facility] [address] [name], [place], [address] Phone: [phone]   Fax:  [phone]  Name: AIRYANNA DIPALMA MRN: [id number] Date of Birth: 1986/11/09  Geoffry Paradise, PT,DPT 02/07/21 2:49 PM Phone: [phone] Fax: [phone]

## 2021-02-07 NOTE — Patient Instructions (Addendum)
Hypotension As your heart beats, it forces blood through your body. This force is called blood pressure. If you have hypotension, you have low blood pressure. When your blood pressure is too low, you may not get enough blood to your brain or other parts of your body. This may cause you to feel weak, light-headed, have a fast heartbeat, or even pass out (faint). Low blood pressure may be harmless, or it may cause serious problems. What are the causes?  Blood loss.  Not enough water in the body (dehydration).  Heart problems.  Hormone problems.  Pregnancy.  A very bad infection.  Not having enough of certain nutrients.  Very bad allergic reactions.  Certain medicines. What increases the risk?  Age. The risk increases as you get older.  Conditions that affect the heart or the brain and spinal cord (central nervous system).  Taking certain medicines.  Being pregnant. What are the signs or symptoms?  Feeling: ? Weak. ? Light-headed. ? Dizzy. ? Tired (fatigued).  Blurred vision.  Fast heartbeat.  Passing out, in very bad cases. How is this treated?  Changing your diet. This may involve eating more salt (sodium) or drinking more water.  Taking medicines to raise your blood pressure.  Changing how much you take (the dosage) of some of your medicines.  Wearing compression stockings. These stockings help to prevent blood clots and reduce swelling in your legs. In some cases, you may need to go to the hospital for:  Fluid replacement. This means you will receive fluids through an IV tube.  Blood replacement. This means you will receive donated blood through an IV tube (transfusion).  Treating an infection or heart problems, if this applies.  Monitoring. You may need to be monitored while medicines that you are taking wear off. Follow these instructions at home: Eating and drinking  Drink enough fluids to keep your pee (urine) pale yellow.  Eat a healthy diet.  Follow instructions from your doctor about what you can eat or drink. A healthy diet includes: ? Fresh fruits and vegetables. ? Whole grains. ? Low-fat (lean) meats. ? Low-fat dairy products.  Eat extra salt only as told. Do not add extra salt to your diet unless your doctor tells you to.  Eat small meals often.  Avoid standing up quickly after you eat.   Medicines  Take over-the-counter and prescription medicines only as told by your doctor. ? Follow instructions from your doctor about changing how much you take of your medicines, if this applies. ? Do not stop or change any of your medicines on your own. General instructions  Wear compression stockings as told by your doctor.  Get up slowly from lying down or sitting.  Avoid hot showers and a lot of heat as told by your doctor.  Return to your normal activities as told by your doctor. Ask what activities are safe for you.  Do not use any products that contain nicotine or tobacco, such as cigarettes, e-cigarettes, and chewing tobacco. If you need help quitting, ask your doctor.  Keep all follow-up visits as told by your doctor. This is important.   Contact a doctor if:  You throw up (vomit).  You have watery poop (diarrhea).  You have a fever for more than 2-3 days.  You feel more thirsty than normal.  You feel weak and tired. Get help right away if:  You have chest pain.  You have a fast or uneven heartbeat.  You lose feeling (have numbness)  in any part of your body.  You cannot move your arms or your legs.  You have trouble talking.  You get sweaty or feel light-headed.  You pass out.  You have trouble breathing.  You have trouble staying awake.  You feel mixed up (confused). Summary  Hypotension is also called low blood pressure. It is when the force of blood pumping through your arteries is too weak.  Hypotension may be harmless, or it may cause serious problems.  Treatment may include changing  your diet and medicines, and wearing compression stockings.  In very bad cases, you may need to go to the hospital. This information is not intended to replace advice given to you by your health care provider. Make sure you discuss any questions you have with your health care provider. Document Revised: 04/22/2018 Document Reviewed: 04/22/2018 Elsevier Patient Education  [place].  Access Code: [email or web address] URL: [email or web address] Date: 02/07/2021 Prepared by: Geoffry Paradise  Exercises Standing Gaze Stabilization with Head Rotation - 2 x daily - 7 x weekly - 1 sets - 3 reps - 30 hold Standing Gaze Stabilization with Head Nod - 2 x daily - 7 x weekly - 1 sets - 3 reps - 30 hold Seated Nose to Right Knee Vestibular Habituation - 2 x daily - 7 x weekly - 2-3 sets - 5 reps Seated Nose to Left Knee Vestibular Habituation - 2 x daily - 7 x weekly - 2-3 sets - 5 reps Feet together with head nods and head turns ON FOAM PAD - 1 x daily - 7 x weekly - 2 sets - 5 reps Romberg Stance Eyes Closed on Foam Pad - 1 x daily - 5 x weekly - 1 sets - 3 reps - 10-30 hold

## 2021-02-14 ENCOUNTER — Other Ambulatory Visit: Payer: Self-pay

## 2021-02-14 ENCOUNTER — Ambulatory Visit: Payer: 59 | Attending: Adult Health

## 2021-02-14 DIAGNOSIS — R42 Dizziness and giddiness: Secondary | ICD-10-CM | POA: Insufficient documentation

## 2021-02-14 DIAGNOSIS — R2681 Unsteadiness on feet: Secondary | ICD-10-CM

## 2021-02-14 NOTE — Therapy (Signed)
[facility] [address] [place], [place], [address] Phone: [phone]   Fax:  [phone]  Physical Therapy Treatment  Patient Details  Name: Julie Saunders MRN: [id number] Date of Birth: 07-Mar-1986 Referring Provider (PT): Ward Givens, NP   Encounter Date: 02/14/2021   PT End of Session - 02/14/21 1624    Visit Number 4    Number of Visits 9    Date for PT Re-Evaluation 03/22/21    Authorization Type UHC (VL: 90)    PT Start Time 1624   patient arriving late   PT Stop Time 1658    PT Time Calculation (min) 34 min    Equipment Utilized During Treatment Other (comment)   S prn   Activity Tolerance Patient tolerated treatment well    Behavior During Therapy [facility] for tasks assessed/performed           Past Medical History:  Diagnosis Date  . 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 Allergy   . Bradycardia   . Cardiac arrest ([place]) 08/31/2014  . Crohn disease ([place])   . Depression   . History of blood transfusion 09/2012   "had allergic rxn to one of my Crohn's RX"   . Migraine    "a few times/yr" (06/08/2015)  . Pancytopenia ([place]) 11/23/2012  . Status post radiofrequency ablation for arrhythmia, atrial tach 06/08/15 06/08/2015  . SVT (supraventricular tachycardia) ([place]) 12/08/2014   likely an atrial tachycardia with CL 250 msec    Past Surgical History:  Procedure Laterality Date  . ABLATION    . ELECTROPHYSIOLOGIC STUDY N/A 06/08/2015   Procedure: SVT Ablation;  Surgeon: Thompson Grayer, MD;  Location: [facility] CV LAB;  Service: Cardiovascular;  Laterality: N/A;  . ELECTROPHYSIOLOGY STUDY N/A 09/11/2014   Procedure: ELECTROPHYSIOLOGY STUDY;  Surgeon: Deboraha Sprang, MD;  Location: [facility] CATH LAB;  Service: Cardiovascular;  Laterality: N/A;  . IMPLANTABLE CARDIOVERTER DEFIBRILLATOR IMPLANT N/A 09/13/2014   Procedure:  IMPLANTABLE CARDIOVERTER DEFIBRILLATOR IMPLANT;  Surgeon: Deboraha Sprang, MD;  Location: [facility] CATH LAB;  Service: Cardiovascular;  Laterality: N/A;; STJ dual chamber ICD implanted by Dr Caryl Comes for aborted cardiac arrest    There were no vitals filed for this visit.   Subjective Assessment - 02/14/21 1626    Subjective Patient reports that she has been doing the exercises. Patient reports mild dizziness (2/10 currently). No other new changes/complaints.    Pertinent History Migraine, Vertigo, Orthostatics, AICD, Hx of Cardiac Arrest, Crohns Disease, Depression, Bradycardia    Patient Stated Goals Resolve the Dizziness; Be able to manage the dizziness.    Currently in Pain? No/denies                             [facility] Adult PT Treatment/Exercise - 02/14/21 0001      Ambulation/Gait   Ambulation/Gait Yes    Ambulation/Gait Assistance 7: Independent    Assistive device None      Therapeutic Activites    Therapeutic Activities Other Therapeutic Activities    Other Therapeutic Activities In seated position: completed four square saccade activitie, able to complete x 3 lines with increase in symptoms to 4/10. Able to resolve with seated rest. followed by two additionals lines. Progressed to completing vertical x 5 lines. more dizziness with vertical > horizontal. Intermittent rest breaks required throughout activities.  Neuro Re-ed    Neuro Re-ed Details  From seated position completed bending reaching and picking up cone x 6 cones, increased lightheadedness and dizziness to 5/10. Seated rest break to allow for symptoms to resolve and completed x 2nd set. Progressed to standing and completed x 1 set, increased symptoms standing > seated. able to resolve after seated rest break.           Vestibular Treatment/Exercise - 02/14/21 0001      Vestibular Treatment/Exercise   Gaze Exercises X1 Viewing Horizontal;X1 Viewing Vertical      X1 Viewing Horizontal   Foot Position  standing feet apart    Reps 3    Comments x 30 seconds; progressed to 45 seconds last two reps, mild symptoms 3-4/10.      X1 Viewing Vertical   Foot Position standing feet apart    Reps 3    Comments x 30 seconds; progressed to 45 seconds last two reps. Current dizziness after completion 3/10             PT Short Term Goals - 01/31/21 1820      PT SHORT TERM GOAL #1   Title Patient will be independent with inital vestibular/balance HEP (ALL STGS Due: 02/22/21)    Baseline no HEP established    Time 4    Period Weeks    Status New    Target Date 02/22/21      PT SHORT TERM GOAL #2   Title Patient will undergo further vestibular/balance assesment (Orthostatics/SOT) and LTG to be set as appropraite    Baseline 01/31/21 Pt was not orthostatic at visit. SOT was performed.    Time 4    Period Weeks    Status Achieved             PT Long Term Goals - 01/31/21 1832      PT LONG TERM GOAL #1   Title Patient will be independent with final vestibular/balance HEP (ALL LTGs Due: 03/22/21)    Baseline no HEP established    Time 8    Period Weeks    Status New      PT LONG TERM GOAL #2   Title Pt will report increase DPS to >/= 50% and DFS >/= 55 on FOTO    Baseline DPS: 45; DFS: 52.3    Time 8    Period Weeks    Status New      PT LONG TERM GOAL #3   Title Pt will increase composite score to >60 for improved visual and vestibular function.    Baseline 01/31/21 51    Time 8    Period Weeks    Status New      PT LONG TERM GOAL #4   Title Patient will improve report 50% improvement in dizziness with functional mobility including supine <> sit, sit <> stand, and with head turns.    Baseline mild dizziness reported with functional movements    Time 8    Period Weeks    Status New                 Plan - 02/14/21 1652    Clinical Impression Statement Continued progression of VOR x 1 in standing, progressing time and speed as able. Patient tolerating 45 seconds  well. Initiated four square saccades vertical/horizontal, as well as continued forward bending seated and standing reahcing for cones. Intermittent increase in dizziness, but resolving quickly. Will continue to progress toward all LTGs.  Personal Factors and Comorbidities Comorbidity 3+;Time since onset of injury/illness/exacerbation    Comorbidities Patient is a 35 y.o. female referred to Neuro OPPT for Vertigo. Patient's PMH significant for the following: Migraine, Vertigo, Orthostatics, AICD, Hx of Cardiac Arrest, Crohns Disease, Depression, Bradycardia. Upon evaluation patient presents with the following impairments: Dizziness,   Patient will benefit from skilled PT services to address the following impairments    Examination-Activity Limitations Bend;Bed Mobility;Stand    Examination-Participation Restrictions Cleaning;Community Activity    Stability/Clinical Decision Making Stable/Uncomplicated    Rehab Potential Fair    PT Frequency 1x / week    PT Duration 8 weeks    PT Treatment/Interventions ADLs/Self Care Home Management;Aquatic Therapy;Canalith Repostioning;Cryotherapy;Electrical Stimulation;Moist Heat;Gait training;Stair training;Functional mobility training;Therapeutic activities;Therapeutic exercise;Balance training;Neuromuscular re-education;Patient/family education;Dry needling;Passive range of motion;Vestibular;Manual techniques    PT Next Visit Plan Progress VOR x 1. Saccades.  Continue to work on balance on compliant surfaces to work visual and vestibular systems more.    PT [place]: [id number]    Consulted and Agree with Plan of Care Patient           Patient will benefit from skilled therapeutic intervention in order to improve the following deficits and impairments:  Dizziness,Decreased activity tolerance,Decreased balance  Visit Diagnosis: Dizziness and giddiness  Unsteadiness on feet     Problem List Patient Active Problem List   Diagnosis  Date Noted  . 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 Dual implantable cardioverter-defibrillator in situ 09/22/2018  . Crohn's disease of both small and large intestine without complication ([place]) 11/94/1740  . Status post radiofrequency ablation for arrhythmia, atrial tach 06/08/15 06/08/2015  . SVT (supraventricular tachycardia) ([place]) 04/16/2015  . Defibrillator discharge   . VT (ventricular tachycardia) ([place])   . Chronic systolic heart failure ([place]) 09/14/2014  . Ventricular fibrillation ([place]) 09/11/2014  . Sinus bradycardia 09/11/2014  . Cardiomyopathy ([place]) 09/11/2014  . Acute renal failure ([place]) 09/08/2014  . Crohn's duodenitis ([place]) 09/08/2014  . Prolonged QT interval 09/08/2014  . Anemia 09/08/2014  . 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 Cardiac arrest ([place]) 08/31/2014  . Hypokalemia 08/31/2014  . Acute respiratory failure with hypoxia ([place]) 08/31/2014  . Pancytopenia ([place]) 11/23/2012    Jones Bales, PT, DPT 02/14/2021, 5:07 PM  [facility] [address] [place], [place], [address] Phone: [phone]   Fax:  [phone]  Name: Julie Saunders MRN: [id number] Date of Birth: 1986-08-12

## 2021-02-19 ENCOUNTER — Other Ambulatory Visit: Payer: Self-pay

## 2021-02-19 ENCOUNTER — Ambulatory Visit: Payer: 59

## 2021-02-19 DIAGNOSIS — R2681 Unsteadiness on feet: Secondary | ICD-10-CM

## 2021-02-19 DIAGNOSIS — R42 Dizziness and giddiness: Secondary | ICD-10-CM

## 2021-02-19 NOTE — Patient Instructions (Signed)
Access Code: [email or web address] URL: [email or web address] Date: 02/19/2021 Prepared by: Baldomero Lamy  Exercises Standing Gaze Stabilization with Head Rotation - 2 x daily - 7 x weekly - 1 sets - 3 reps - 30 hold Standing Gaze Stabilization with Head Nod - 2 x daily - 7 x weekly - 1 sets - 3 reps - 30 hold Seated Nose to Right Knee Vestibular Habituation - 2 x daily - 7 x weekly - 2-3 sets - 5 reps Seated Nose to Left Knee Vestibular Habituation - 2 x daily - 7 x weekly - 2-3 sets - 5 reps Romberg Stance Eyes Closed on Foam Pad - 1 x daily - 5 x weekly - 1 sets - 3 reps - 10-30 hold Romberg Stance with Head Nods on Foam Pad - 1 x daily - 7 x weekly - 2 sets - 10 reps Romberg Stance on Foam Pad with Head Rotation - 1 x daily - 7 x weekly - 3 sets - 10 reps Tandem Walking - 1 x daily - 7 x weekly - 3 sets

## 2021-02-19 NOTE — Therapy (Signed)
[facility] [address] [place], [place], [address] Phone: [phone]   Fax:  [phone]  Physical Therapy Treatment  Patient Details  Name: Julie Saunders MRN: [id number] Date of Birth: [birth year] Referring Provider (PT): Ward Givens, NP   Encounter Date: 02/19/2021   PT End of Session - 02/19/21 1442    Visit Number 5    Number of Visits 9    Date for PT Re-Evaluation 03/22/21    Authorization Type UHC (VL: 90)    PT Start Time 1410   patient arriving late   PT Stop Time 1444    PT Time Calculation (min) 34 min    Equipment Utilized During Treatment Other (comment)   S prn   Activity Tolerance Patient tolerated treatment well    Behavior During Therapy [facility] for tasks assessed/performed           Past Medical History:  Diagnosis Date  . 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 Allergy   . Bradycardia   . Cardiac arrest ([place]) 08/31/2014  . Crohn disease ([place])   . Depression   . History of blood transfusion 09/2012   "had allergic rxn to one of my Crohn's RX"   . Migraine    "a few times/yr" (06/08/2015)  . Pancytopenia ([place]) 11/23/2012  . Status post radiofrequency ablation for arrhythmia, atrial tach 06/08/15 06/08/2015  . SVT (supraventricular tachycardia) ([place]) 12/08/2014   likely an atrial tachycardia with CL 250 msec    Past Surgical History:  Procedure Laterality Date  . ABLATION    . ELECTROPHYSIOLOGIC STUDY N/A 06/08/2015   Procedure: SVT Ablation;  Surgeon: Thompson Grayer, MD;  Location: [facility] CV LAB;  Service: Cardiovascular;  Laterality: N/A;  . ELECTROPHYSIOLOGY STUDY N/A 09/11/2014   Procedure: ELECTROPHYSIOLOGY STUDY;  Surgeon: Deboraha Sprang, MD;  Location: [facility] CATH LAB;  Service: Cardiovascular;  Laterality: N/A;  . IMPLANTABLE CARDIOVERTER DEFIBRILLATOR IMPLANT N/A 09/13/2014   Procedure:  IMPLANTABLE CARDIOVERTER DEFIBRILLATOR IMPLANT;  Surgeon: Deboraha Sprang, MD;  Location: [facility] CATH LAB;  Service: Cardiovascular;  Laterality: N/A;; STJ dual chamber ICD implanted by Dr Caryl Comes for aborted cardiac arrest    There were no vitals filed for this visit.   Subjective Assessment - 02/19/21 1412    Subjective Patient reports dizziness has been fluctuating the last few days, reports that it is about 3/10 currently. No other new changes/complaints.    Pertinent History Migraine, Vertigo, Orthostatics, AICD, Hx of Cardiac Arrest, Crohns Disease, Depression, Bradycardia    Patient Stated Goals Resolve the Dizziness; Be able to manage the dizziness.    Currently in Pain? No/denies              [facility] Adult PT Treatment/Exercise - 02/19/21 0001      Ambulation/Gait   Ambulation/Gait Yes    Ambulation/Gait Assistance 7: Independent    Ambulation Distance (Feet) --   clinic distance, with activities   Assistive device None    Gait Pattern Within Functional Limits    Ambulation Surface Level;Indoor      Therapeutic Activites    Therapeutic Activities Other Therapeutic Activities    Other Therapeutic Activities Completed ambulation with visual tracking activity approx 30" each way with ball, completed vertical movements 2 x 30', followed by diagonal to each direction 2 x 30'. Then completed self ball toss with visual tracking 2 x 30". Increased dizziness noted with  diagonal visual tracking, intermittent standing rest breaks required to allow for resolution of dizziness.            Balance Exercises - 02/19/21 0001      Balance Exercises: Standing   Standing Eyes Opened Narrow base of support (BOS);Head turns;Foam/compliant surface;Limitations    Standing Eyes Opened Limitations on airex with narrow BOS completed horizontal/verticla head turns x 10 reps, mild sway noted. no dizziness.    Standing Eyes Closed Narrow base of support (BOS);Foam/compliant surface;Head turns;Limitations;3  reps;30 secs    Standing Eyes Closed Limitations stnading with narrow BOS completed eyes closed 2 x 30 seconds, then completed wide BOS with head turns/nods x 10 reps with eyes closed, then progressed to narrow BOS x 10 reps each. updated HEP    Tandem Gait Forward;Intermittent upper extremity support;3 reps;Limitations    Tandem Gait Limitations completed x 3 laps down and back at countertop with light UE support intermittently, with addition of head turns          Updated HEP, new additions bolded:   Access Code: [email or web address] URL: [email or web address] Date: 02/19/2021 Prepared by: Baldomero Lamy  Exercises Standing Gaze Stabilization with Head Rotation - 2 x daily - 7 x weekly - 1 sets - 3 reps - 30 hold Standing Gaze Stabilization with Head Nod - 2 x daily - 7 x weekly - 1 sets - 3 reps - 30 hold Seated Nose to Right Knee Vestibular Habituation - 2 x daily - 7 x weekly - 2-3 sets - 5 reps Seated Nose to Left Knee Vestibular Habituation - 2 x daily - 7 x weekly - 2-3 sets - 5 reps Romberg Stance Eyes Closed on Foam Pad - 1 x daily - 5 x weekly - 1 sets - 3 reps - 10-30 hold Romberg Stance with Head Nods on Foam Pad - 1 x daily - 7 x weekly - 2 sets - 10 reps Romberg Stance on Foam Pad with Head Rotation - 1 x daily - 7 x weekly - 3 sets - 10 reps Tandem Walking - 1 x daily - 7 x weekly - 3 sets    PT Education - 02/19/21 1444    Education Details HEP Update (see patient instructions)    Person(s) Educated Patient    Methods Explanation;Demonstration;Handout    Comprehension Verbalized understanding;Returned demonstration            PT Short Term Goals - 02/19/21 1414      PT SHORT TERM GOAL #1   Title Patient will be independent with inital vestibular/balance HEP (ALL STGS Due: 02/22/21)    Baseline reports independence    Time 4    Period Weeks    Status Achieved    Target Date 02/22/21      PT SHORT TERM GOAL #2   Title Patient will undergo further  vestibular/balance assesment (Orthostatics/SOT) and LTG to be set as appropraite    Baseline 01/31/21 Pt was not orthostatic at visit. SOT was performed.    Time 4    Period Weeks    Status Achieved             PT Long Term Goals - 01/31/21 1832      PT LONG TERM GOAL #1   Title Patient will be independent with final vestibular/balance HEP (ALL LTGs Due: 03/22/21)    Baseline no HEP established    Time 8    Period Weeks    Status New      PT  LONG TERM GOAL #2   Title Pt will report increase DPS to >/= 50% and DFS >/= 55 on FOTO    Baseline DPS: 45; DFS: 52.3    Time 8    Period Weeks    Status New      PT LONG TERM GOAL #3   Title Pt will increase composite score to >60 for improved visual and vestibular function.    Baseline 01/31/21 51    Time 8    Period Weeks    Status New      PT LONG TERM GOAL #4   Title Patient will improve report 50% improvement in dizziness with functional mobility including supine <> sit, sit <> stand, and with head turns.    Baseline mild dizziness reported with functional movements    Time 8    Period Weeks    Status New                 Plan - 02/19/21 1447    Clinical Impression Statement Today's session focused on progressing balance exercise to patient tolerance, increased sway  noted with vision removed and addition of head turns/nods. Updated and progressed HEP to patient tolerance. Continued visual tracking activity, mild increase in dizziness with diagonal movements reported. Continue to resolve quickly with seated rest break. Will continue to progress toward all LTGs.    Personal Factors and Comorbidities Comorbidity 3+;Time since onset of injury/illness/exacerbation    Comorbidities Patient is a 35 y.o. female referred to Neuro OPPT for Vertigo. Patient's PMH significant for the following: Migraine, Vertigo, Orthostatics, AICD, Hx of Cardiac Arrest, Crohns Disease, Depression, Bradycardia. Upon evaluation patient presents with  the following impairments: Dizziness,   Patient will benefit from skilled PT services to address the following impairments    Examination-Activity Limitations [place];Bed Mobility;Stand    Examination-Participation Restrictions Cleaning;Community Activity    Stability/Clinical Decision Making Stable/Uncomplicated    Rehab Potential Fair    PT Frequency 1x / week    PT Duration 8 weeks    PT Treatment/Interventions ADLs/Self Care Home Management;Aquatic Therapy;Canalith Repostioning;Cryotherapy;Electrical Stimulation;Moist Heat;Gait training;Stair training;Functional mobility training;Therapeutic activities;Therapeutic exercise;Balance training;Neuromuscular re-education;Patient/family education;Dry needling;Passive range of motion;Vestibular;Manual techniques    PT Next Visit Plan How was additions/changes to HEP? Progress VOR x 1. Saccades. Visual Tracking. Figure8. Continue to work on balance on compliant surfaces to work visual and vestibular systems more.    PT [place]: [id number]    Consulted and Agree with Plan of Care Patient           Patient will benefit from skilled therapeutic intervention in order to improve the following deficits and impairments:  Dizziness,Decreased activity tolerance,Decreased balance  Visit Diagnosis: Dizziness and giddiness  Unsteadiness on feet     Problem List Patient Active Problem List   Diagnosis Date Noted  . 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 Dual implantable cardioverter-defibrillator in situ 09/22/2018  . Crohn's disease of both small and large intestine without complication ([place]) 38/88/2800  . Status post radiofrequency ablation for arrhythmia, atrial tach 06/08/15 06/08/2015  . SVT (supraventricular tachycardia) ([place]) 04/16/2015  . Defibrillator discharge   . VT (ventricular tachycardia) ([place])   . Chronic systolic heart failure ([place]) 09/14/2014  . Ventricular fibrillation ([place]) 09/11/2014  . Sinus bradycardia  09/11/2014  . Cardiomyopathy ([place]) 09/11/2014  . Acute renal failure ([place]) 09/08/2014  . Crohn's duodenitis ([place]) 09/08/2014  . Prolonged QT interval 09/08/2014  . Anemia 09/08/2014  . 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  Cardiac arrest ([place]) 08/31/2014  . Hypokalemia 08/31/2014  . Acute respiratory failure with hypoxia ([place]) 08/31/2014  . Pancytopenia ([place]) 11/23/2012    Jones Bales, PT, DPT 02/19/2021, 4:05 PM  [facility] [address] [place], [place], [address] Phone: [phone]   Fax:  [phone]  Name: Julie Saunders MRN: [id number] Date of Birth: 1986-07-14

## 2021-02-26 ENCOUNTER — Ambulatory Visit: Payer: 59

## 2021-02-28 ENCOUNTER — Ambulatory Visit (INDEPENDENT_AMBULATORY_CARE_PROVIDER_SITE_OTHER): Payer: 59

## 2021-02-28 DIAGNOSIS — I469 Cardiac arrest, cause unspecified: Secondary | ICD-10-CM | POA: Diagnosis not present

## 2021-02-28 LAB — CUP PACEART REMOTE DEVICE CHECK
Battery Remaining Longevity: 35 mo
Battery Remaining Percentage: 35 %
Battery Voltage: 2.86 V
Brady Statistic AP VP Percent: 1 %
Brady Statistic AP VS Percent: 43 %
Brady Statistic AS VP Percent: 1 %
Brady Statistic AS VS Percent: 56 %
Brady Statistic RA Percent Paced: 39 %
Brady Statistic RV Percent Paced: 1 %
Date Time Interrogation Session: 20220421020015
HighPow Impedance: 79 Ohm
HighPow Impedance: 79 Ohm
Implantable Lead Implant Date: 20151104
Implantable Lead Implant Date: 20151104
Implantable Lead Location: 753859
Implantable Lead Location: 753860
Implantable Lead Model: 5076
Implantable Pulse Generator Implant Date: 20151104
Lead Channel Impedance Value: 330 Ohm
Lead Channel Impedance Value: 410 Ohm
Lead Channel Pacing Threshold Amplitude: 0.5 V
Lead Channel Pacing Threshold Amplitude: 1 V
Lead Channel Pacing Threshold Pulse Width: 0.4 ms
Lead Channel Pacing Threshold Pulse Width: 0.4 ms
Lead Channel Sensing Intrinsic Amplitude: 3.4 mV
Lead Channel Sensing Intrinsic Amplitude: 8 mV
Lead Channel Setting Pacing Amplitude: 2 V
Lead Channel Setting Pacing Amplitude: 2.5 V
Lead Channel Setting Pacing Pulse Width: 0.4 ms
Lead Channel Setting Sensing Sensitivity: 0.5 mV
Pulse Gen Serial Number: 7179320

## 2021-03-07 ENCOUNTER — Other Ambulatory Visit: Payer: Self-pay

## 2021-03-07 ENCOUNTER — Ambulatory Visit: Payer: 59

## 2021-03-07 DIAGNOSIS — R42 Dizziness and giddiness: Secondary | ICD-10-CM

## 2021-03-07 DIAGNOSIS — R2681 Unsteadiness on feet: Secondary | ICD-10-CM

## 2021-03-07 NOTE — Therapy (Signed)
[facility] [address] [place], [place], [address] Phone: [phone]   Fax:  [phone]  Physical Therapy Treatment  Patient Details  Name: Julie Saunders MRN: [id number] Date of Birth: Jul 10, 1986 Referring Provider (PT): Ward Givens, NP   Encounter Date: 03/07/2021   PT End of Session - 03/07/21 1705    Visit Number 6    Number of Visits 9    Date for PT Re-Evaluation 03/22/21    Authorization Type UHC (VL: 90)    PT Start Time 1704    PT Stop Time 1745    PT Time Calculation (min) 41 min    Equipment Utilized During Treatment Other (comment)   S prn   Activity Tolerance Patient tolerated treatment well    Behavior During Therapy [facility] for tasks assessed/performed           Past Medical History:  Diagnosis Date  . 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 Allergy   . Bradycardia   . Cardiac arrest ([place]) 08/31/2014  . Crohn disease ([place])   . Depression   . History of blood transfusion 09/2012   "had allergic rxn to one of my Crohn's RX"   . Migraine    "a few times/yr" (06/08/2015)  . Pancytopenia ([place]) 11/23/2012  . Status post radiofrequency ablation for arrhythmia, atrial tach 06/08/15 06/08/2015  . SVT (supraventricular tachycardia) ([place]) 12/08/2014   likely an atrial tachycardia with CL 250 msec    Past Surgical History:  Procedure Laterality Date  . ABLATION    . ELECTROPHYSIOLOGIC STUDY N/A 06/08/2015   Procedure: SVT Ablation;  Surgeon: Thompson Grayer, MD;  Location: [facility];  Service: Cardiovascular;  Laterality: N/A;  . ELECTROPHYSIOLOGY STUDY N/A 09/11/2014   Procedure: ELECTROPHYSIOLOGY STUDY;  Surgeon: Deboraha Sprang, MD;  Location: [facility] CATH LAB;  Service: Cardiovascular;  Laterality: N/A;  . IMPLANTABLE CARDIOVERTER DEFIBRILLATOR IMPLANT N/A 09/13/2014   Procedure: IMPLANTABLE  CARDIOVERTER DEFIBRILLATOR IMPLANT;  Surgeon: Deboraha Sprang, MD;  Location: [facility] CATH LAB;  Service: Cardiovascular;  Laterality: N/A;; STJ dual chamber ICD implanted by Dr Caryl Comes for aborted cardiac arrest    There were no vitals filed for this visit.   Subjective Assessment - 03/07/21 1706    Subjective Patient reports that feels like making improvements, able to bend down without immediate symptoms. Reports new exercises are going great. Slight lightheadedness currently. No other new changes/complaints.    Pertinent History Migraine, Vertigo, Orthostatics, AICD, Hx of Cardiac Arrest, Crohns Disease, Depression, Bradycardia    Patient Stated Goals Resolve the Dizziness; Be able to manage the dizziness.    Currently in Pain? No/denies               [facility] Adult PT Treatment/Exercise - 03/07/21 0001      Ambulation/Gait   Ambulation/Gait Yes    Ambulation/Gait Assistance 7: Independent    Ambulation Distance (Feet) --   clinic distances   Assistive device None    Gait Pattern Within Functional Limits    Ambulation Surface Level;Indoor      Therapeutic Activites    Therapeutic Activities Other Therapeutic Activities    Other Therapeutic Activities standing on foam in // bars:  completed visual tracking CW/CCW circles x 15 reps, mild dizziness.  Then completed diagonal movement with ball x 15 reps to B directions. Increased dizziness noted with diagonal visual tracking (rated 4/10). intermittent standing  rest breaks required to allow for resolution of dizziness. Completd standing with ball on firm surface completed Figure 8 through legs2 x 5 reps. Increased lightheadedness reported           Vestibular Treatment/Exercise - 03/07/21 0001      Vestibular Treatment/Exercise   Vestibular Treatment Provided Gaze    Gaze Exercises X1 Viewing Horizontal;X1 Viewing Vertical      X1 Viewing Horizontal   Foot Position standing feet apart > feet together    Reps 3    Comments x 30 seconds  each; progressed to 45 seconds.      X1 Viewing Vertical   Foot Position standing feet apart > feet together    Reps 3    Comments x 30 seconds; lightheadedness after completion              Balance Exercises - 03/07/21 0001      Balance Exercises: Standing   Rockerboard Anterior/posterior;Head turns;EO;EC;Limitations    Rockerboard Limitations standing on rockerboard ant/post: completed holding steady with eyes open x 1 minute, then progressed to horizontal/vertical head turrns x 15 reps each, increased challenge with horiz > vertical. progressed to eyes closed 3 x 30 seconds, with intermittent CGA required. then completed all of the following with board positioned laterally. increased challenge overall with board lateral > A/P    Marching Solid surface;Static;Head turns;Limitations    Marching Limitations standing on incline: completed static marching with eyes open then completd with addition of horizontal/vertical head turns x 15 reps each. Often needing rest between due to challenge. Then completed static with addition of eyes closed x 20-25 seconds. Increased challenge with vertical > horiz.               PT Short Term Goals - 02/19/21 1414      PT SHORT TERM GOAL #1   Title Patient will be independent with inital vestibular/balance HEP (ALL STGS Due: 02/22/21)    Baseline reports independence    Time 4    Period Weeks    Status Achieved    Target Date 02/22/21      PT SHORT TERM GOAL #2   Title Patient will undergo further vestibular/balance assesment (Orthostatics/SOT) and LTG to be set as appropraite    Baseline 01/31/21 Pt was not orthostatic at visit. SOT was performed.    Time 4    Period Weeks    Status Achieved             PT Long Term Goals - 01/31/21 1832      PT LONG TERM GOAL #1   Title Patient will be independent with final vestibular/balance HEP (ALL LTGs Due: 03/22/21)    Baseline no HEP established    Time 8    Period Weeks    Status New       PT LONG TERM GOAL #2   Title Pt will report increase DPS to >/= 50% and DFS >/= 55 on FOTO    Baseline DPS: 45; DFS: 52.3    Time 8    Period Weeks    Status New      PT LONG TERM GOAL #3   Title Pt will increase composite score to >60 for improved visual and vestibular function.    Baseline 01/31/21 51    Time 8    Period Weeks    Status New      PT LONG TERM GOAL #4   Title Patient will improve report 50% improvement in dizziness  with functional mobility including supine <> sit, sit <> stand, and with head turns.    Baseline mild dizziness reported with functional movements    Time 8    Period Weeks    Status New                 Plan - 03/07/21 1717    Clinical Impression Statement Continued balance activities focusing on challenging vestibular input, continue to have intermittent mild dizziness with activities. Progressed VOR x 1 from wide BOS > narrow BOS. Continue to demo increased postural sway with vision removed. Will continue to progress toward all LTGs.    Personal Factors and Comorbidities Comorbidity 3+;Time since onset of injury/illness/exacerbation    Comorbidities Patient is a 35 y.o. female referred to Neuro OPPT for Vertigo. Patient's PMH significant for the following: Migraine, Vertigo, Orthostatics, AICD, Hx of Cardiac Arrest, Crohns Disease, Depression, Bradycardia. Upon evaluation patient presents with the following impairments: Dizziness,   Patient will benefit from skilled PT services to address the following impairments    Examination-Activity Limitations Bend;Bed Mobility;Stand    Examination-Participation Restrictions Cleaning;Community Activity    Stability/Clinical Decision Making Stable/Uncomplicated    Rehab Potential Fair    PT Frequency 1x / week    PT Duration 8 weeks    PT Treatment/Interventions ADLs/Self Care Home Management;Aquatic Therapy;Canalith Repostioning;Cryotherapy;Electrical Stimulation;Moist Heat;Gait training;Stair  training;Functional mobility training;Therapeutic activities;Therapeutic exercise;Balance training;Neuromuscular re-education;Patient/family education;Dry needling;Passive range of motion;Vestibular;Manual techniques    PT Next Visit Plan Continue to rogress VOR x 1. Saccades. Visual Tracking. Figure8. Continue to work on balance on compliant surfaces to work visual and vestibular systems more.    PT [place]: [id number]    Consulted and Agree with Plan of Care Patient           Patient will benefit from skilled therapeutic intervention in order to improve the following deficits and impairments:  Dizziness,Decreased activity tolerance,Decreased balance  Visit Diagnosis: Dizziness and giddiness  Unsteadiness on feet     Problem List Patient Active Problem List   Diagnosis Date Noted  . 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 Dual implantable cardioverter-defibrillator in situ 09/22/2018  . Crohn's disease of both small and large intestine without complication ([place]) 96/43/8381  . Status post radiofrequency ablation for arrhythmia, atrial tach 06/08/15 06/08/2015  . SVT (supraventricular tachycardia) ([place]) 04/16/2015  . Defibrillator discharge   . VT (ventricular tachycardia) ([place])   . Chronic systolic heart failure ([place]) 09/14/2014  . Ventricular fibrillation ([place]) 09/11/2014  . Sinus bradycardia 09/11/2014  . Cardiomyopathy ([place]) 09/11/2014  . Acute renal failure ([place]) 09/08/2014  . Crohn's duodenitis ([place]) 09/08/2014  . Prolonged QT interval 09/08/2014  . Anemia 09/08/2014  . 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 Cardiac arrest ([place]) 08/31/2014  . Hypokalemia 08/31/2014  . Acute respiratory failure with hypoxia ([place]) 08/31/2014  . Pancytopenia ([place]) 11/23/2012    Jones Bales, PT, DPT 03/07/2021, 9:23 PM  [facility] [address] [place], [place], [address] Phone:  [phone]   Fax:  [phone]  Name: Julie Saunders MRN: [id number] Date of Birth: [birth year]

## 2021-03-11 ENCOUNTER — Encounter (HOSPITAL_COMMUNITY): Payer: Self-pay | Admitting: Internal Medicine

## 2021-03-11 ENCOUNTER — Other Ambulatory Visit: Payer: Self-pay

## 2021-03-11 ENCOUNTER — Ambulatory Visit (HOSPITAL_COMMUNITY)
Admission: RE | Admit: 2021-03-11 | Discharge: 2021-03-11 | Disposition: A | Discharge status: Home / Self Care | Payer: 59 | Source: Ambulatory Visit | Attending: Internal Medicine | Admitting: Internal Medicine

## 2021-03-11 ENCOUNTER — Encounter (HOSPITAL_COMMUNITY): Payer: Self-pay | Admitting: *Deleted

## 2021-03-11 VITALS — BP 130/88 | HR 61 | Wt 185.4 lb

## 2021-03-11 DIAGNOSIS — I471 Supraventricular tachycardia, unspecified: Secondary | ICD-10-CM

## 2021-03-11 DIAGNOSIS — I5022 Chronic systolic (congestive) heart failure: Secondary | ICD-10-CM

## 2021-03-11 MED ORDER — SPIRONOLACTONE 25 MG PO TABS
12.5000 mg | ORAL_TABLET | Freq: Every day | ORAL | 6 refills | Status: DC
Start: 2021-03-11 — End: 2021-08-20

## 2021-03-11 NOTE — Progress Notes (Signed)
ReDS Vest / Clip - 03/11/21 1600      ReDS Vest / Clip   Station Marker A    Ruler Value 29    ReDS Value Range Low volume    ReDS Actual Value 23

## 2021-03-11 NOTE — Progress Notes (Signed)
ADVANCED HF CLINIC CONSULT NOTE  Referring Physician: Alroy Dust, L. Disney Primary Care: Alroy Dust, L. Dean Primary Cardiologist: Virl Axe  HPI:  35 yo female with PMH of Crohn's disease, migraine headaches, Chronic Systolic CHF, SVT s/p ablation 06/08/15, Vtach, presumed long QT, s/p ICD implant,   Initially she presented to [facility] ER on 08/31/14 after collapse at home in the setting of prolonged diarrhea 2/2 crohn's disease.  Arrived with K of 2.6. She was unresponsive for approx. 10-15 minutes before fire department arrived and initiated CPR for 15 minutes until EMS arrived and initiated ACLS with CPR for 15 more minutes prior to ROSC.  Pt was defibrillated 4x for vfib, and received 4 rounds of epinephrine, 150 mg amiodarone, narcan, D50, and was intubated.  10/24- rewarmed, followed commands.  She had an ECHO 08/31/14 with EF 25-30% which was thought to be secondary to her arrest, given that her repeat on 09/08/14 EF improved to 60%. Post cardiac arrest she developed sinus bradycardia and ATN.  Because of her acute kidney injury secondary to the arrest she could not undergo cardiac catheterization.  09/11/14 had epinephrine infusion observation with rare ectopic beats and stable QT at 472mec.  Also had flecainide brugada challenge which was negative.  Stress Myoview on 09/12/14 showed no evidence of prior infarct or ischemia, mild diffuse global hypokinesis, EF 52%.  She was evaluated for ICD placement and the decision was made to place a dual chamber St Jude  ICD, serial number  [id number] on 09/11/14.  Cardiac meds on discharge were spiro 12.584mand propanolol 8068m   12/08/2014 defibrillator discharged and she went to the ED interrogation revealed episode of polymorphic Vtach followed by SVT/atrial tach initial rate 240. EP consulted and restarted her propanolol (was stopped at outpatient visit due to dizziness) and to follow up with Dr. KleCaryl ComesDr. KleCaryl Comescreased propanolol to 120m71mrecommended and performed SVT ablation 06/08/15.    08/2016 came to her outpatient visit with Dr. KleiCaryl Comes was severely hypotensive found to have lost 24 additional pounds related to Crohn's. No further SVT or Vtach noted.  Propanolol d/ced.  [facility] started on steroids, TPN.    Crohn's regimen modified by [facility] okay for the next few years, no real trouble with arrhythmias ECHO studies stable normal EF until 11/06/2020 where her EF dropped down to 35-40%.  Coreg 3.125 started but GDMT has been limited in the past by dizziness, orthostasis and low bp.  Initially started BID then decreased to at bedtime.    Feels her Crohn's disease currently is well controlled.  Has noticed shortness of breath and easy fatigability.  She has noticed shortness of breath with exerting herself and especially when it's hot outside.  Denies any wheezing, has never used an inhaler.  Has noticed some ankle swelling over the years as well.  Can walk around stores and shop.  If she takes bigger walks or taking a shower can sometimes make her short of breath.  Denies any chest pain.  Denies orthopnea or PND.    Some Asthma in family, Gran107cle  Mother with MVP, MI at age 32  79view of Systems: [y] = yes, [ ]  = no   General: Weight gain [ ] ; Weight loss [ ] ; Anorexia [ ] ; Fatigue [y ]Blue.ReeseFever [ ] ; Chills [ ] ; Weakness [ ]   Cardiac: Chest pain/pressure [ ] ; Resting SOB [ ] ;  Exertional SOB Blue.Reese ]; Orthopnea [ ] ; Pedal Edema [ y]; Palpitations [ ] ; Syncope [ ] ; Presyncope [ ] ; Paroxysmal nocturnal dyspnea[ ]   Pulmonary: Cough [ ] ; Wheezing[ ] ; Hemoptysis[ ] ; Sputum [ ] ; Snoring [ ]   GI: Vomiting[ ] ; Dysphagia[ ] ; Melena[ ] ; Hematochezia [ ] ; Heartburn[ ] ; Abdominal pain Blue.Reese ]; Constipation [ ] ; Diarrhea Blue.Reese ]; BRBPR [ ]   GU: Hematuria[ ] ; Dysuria [ ] ; Nocturia[ ]   Vascular: Pain in legs with walking [ ] ; Pain in feet with lying flat [ ] ;  Non-healing sores [ ] ; Stroke [ ] ; TIA [ ] ; Slurred speech [ ] ;  Neuro: Headaches[ ] ; Vertigo[ ] ; Seizures[ ] ; Paresthesias[ ] ;Blurred vision [ ] ; Diplopia [ ] ; Vision changes [ ]   Ortho/Skin: Arthritis [ y]; Joint pain [ y]; Muscle pain [ ] ; Joint swelling [ ] ; Back Pain [ ] ; Rash [ ]   Psych: Depression[y ]; Anxiety[ ]   Heme: Bleeding problems [ ] ; Clotting disorders [ ] ; Anemia [ ]   Endocrine: Diabetes [ ] ; Thyroid dysfunction[ ]    Past Medical History:  Diagnosis Date  . 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 Allergy   . Bradycardia   . Cardiac arrest ([place]) 08/31/2014  . Crohn disease ([place])   . Depression   . History of blood transfusion 09/2012   "had allergic rxn to one of my Crohn's RX"   . Migraine    "a few times/yr" (06/08/2015)  . Pancytopenia ([place]) 11/23/2012  . Status post radiofrequency ablation for arrhythmia, atrial tach 06/08/15 06/08/2015  . SVT (supraventricular tachycardia) ([place]) 12/08/2014   likely an atrial tachycardia with CL 250 msec    Current Outpatient Medications  Medication Sig Dispense Refill  . acetaminophen (TYLENOL) 325 MG tablet Take 650 mg by mouth every 6 (six) hours as needed for headache.    . budesonide (ENTOCORT EC) 3 MG 24 hr capsule Take 9 mg by mouth daily.    . cetirizine (ZYRTEC) 10 MG tablet Take 10 mg by mouth daily.    . Cholecalciferol 250 MCG (10000 UT) CAPS Take by mouth.    . Cobalamin Combinations (B-12) [id number] MCG SUBL Place 1 tablet under the tongue daily.    . diazepam (VALIUM) 5 MG tablet Take 1 tablet (5 mg total) by mouth every 12 (twelve) hours as needed (vertigpo). 10 tablet 0  . Galcanezumab-gnlm (EMGALITY) 120 MG/ML SOAJ Inject 120 mg into the skin every 30 (thirty) days. 1 mL 11  . Pediatric Multiple Vit-C-FA (ANIMAL CHEWABLE MULTIVITAMIN PO) Take 1 tablet by mouth daily.    . potassium chloride SA (KLOR-CON M20) 20 MEQ  tablet Take 1 tablet (20 mEq total) by mouth daily. 90 tablet 3  . thiamine (VITAMIN B-1) 100 MG tablet Take 300 mg by mouth daily.    . ustekinumab (STELARA) 90 MG/ML SOSY injection Use as directed     No current facility-administered medications for this encounter.    Allergies  Allergen Reactions  . Sulfa Antibiotics Rash  . Sulfacetamide Sodium Rash      Social History   Socioeconomic History  . Marital status: Single    Spouse name: Not on file  . Number of children: Not on file  . Years of education: Not on file  . Highest education level: Not on file  Occupational History  . Not on file  Tobacco Use  . Smoking status: Never Smoker  . Smokeless tobacco: Never Used  Vaping Use  . Vaping Use: Never used  Substance and Sexual Activity  . Alcohol use: Yes    Comment: occasional/social  . Drug use: No  . Sexual activity: Never  Other Topics Concern  . Not on file  Social History Narrative  . Not on file   Social Determinants of Health   Financial Resource Strain: Not on file  Food Insecurity: Not on file  Transportation Needs: Not on file  Physical Activity: Not on file  Stress: Not on file  Social Connections: Not on file  Intimate Partner Violence: Not on file      Family History  Problem Relation Age of Onset  . Heart attack Mother   . Migraines Mother   . Heart disease Mother   . Heart attack Maternal Grandmother   . Heart disease Maternal Grandmother   . Diabetes Maternal Grandfather   . Asthma Paternal Grandmother   . Diabetes Paternal Grandfather     Vitals:   03/11/21 1505  BP: 130/88  Pulse: 61  SpO2: 97%  Weight: 84.1 kg (185 lb 6.4 oz)    PHYSICAL EXAM: General:  Well appearing. No respiratory difficulty HEENT: normal Neck: supple. no JVD. Carotids 2+ bilat; no bruits. No lymphadenopathy or thryomegaly appreciated. Cor: PMI nondisplaced. Regular rate & rhythm. No rubs, gallops or murmurs. Lungs: clear Abdomen: soft, nontender,  nondistended. No hepatosplenomegaly. No bruits or masses. Good bowel sounds. Extremities: no cyanosis, clubbing, rash, tr edema Neuro: alert & oriented x 3, cranial nerves grossly intact. moves all 4 extremities w/o difficulty. Affect  flat  ASSESSMENT & PLAN:  1. Chronic Systolic CHF: -97/02/63 with EF 25-30% which was thought to be secondary to her cardiac arrest which occurred in the setting of crohn's flare and severe hypokalemia, - - Repeat ECHO on 09/08/14 EF improved to 60%  -Could not undergo LHC due to ATN from arrest, she had dual chamber St. Jude ICD implanted 09/13/14 - Subsequent ECHO studies with normal EF althout 2020 low normal at 50-55%  - Echo12/28/2021 EF read as  35-40% - I have reviewed echos. I think EF from 12/21 Likely ~45% if not 45-50% with normal diastolic function is very reassuring - ReDS low at 23% - I suspect symptoms related more to deconditioning then HF.  - Has been unable to tolerate GDMT due to low BP.  - Will re-attempt low-dose spiro 12.5. Follow BPs closely at home - Refer to CR or Pace program to increase activity     2. Crohn's Disease: -now followed by [facility] GI -on stelara, budesonide  3. H/o SVT: -felt to be atrial tach had an ablation 06/08/15 - in NSR now   Glori Bickers, MD  9:19 AM

## 2021-03-11 NOTE — Patient Instructions (Signed)
Start Spironolactone 12.5 mg (1/2 tab) Daily at bedtime  Your physician has requested that you regularly monitor and record your blood pressure readings at home. Please use the same machine at the same time of day to check your readings and record them to bring to your follow-up visit.  Your physician recommends that you schedule a follow-up appointment in: 2 months  If you have any questions or concerns before your next appointment please send [place] a message through [place] or call our office at [phone].    TO LEAVE A MESSAGE FOR THE NURSE SELECT OPTION 2, PLEASE LEAVE A MESSAGE INCLUDING: . YOUR NAME . DATE OF BIRTH . CALL BACK NUMBER . REASON FOR CALL**this is important as we prioritize the call backs  [place] AS LONG AS YOU CALL BEFORE 4:00 PM  At the [facility], you and your health needs are our priority. As part of our continuing mission to provide you with exceptional heart care, we have created designated Provider Care Teams. These Care Teams include your primary Cardiologist (physician) and Advanced Practice Providers (APPs- Physician Assistants and Nurse Practitioners) who all work together to provide you with the care you need, when you need it.   You may see any of the following providers on your designated Care Team at your next follow up: Marland Kitchen Dr Glori Bickers . Dr Loralie Champagne . Dr Vickki Muff . Darrick Grinder, NP . Lyda Jester, [place] . Audry Riles, PharmD   Please be sure to bring in all your medications bottles to every appointment.

## 2021-03-11 NOTE — Progress Notes (Signed)
Patient referred to Clinical Exercise Physiologist by Dr. Haroldine Laws for guidance and discussion about safe home exercises and/or starting an exercise program. Exercise programming options were discussed in detailed with safety precautions to patient and accompanying caregiver. Patient is interested and plans to join (at her request) Neurosurgeon at [facility] as she does not drive and is able to access this facility. Clinical Exercise Physiologist will provide home exercise prescription via E-mail and will follow-up with patient accordingly.  Patient was presented with an information packet including demonstrations of the exercise plan discussed. All patient's questions were answered and patient was given contact information for further questions or concerns regarding their exercise.     Landis Martins, MS, ACSM, [facility] Clinical Exercise Physiologist/ Health and Wellness Coach

## 2021-03-14 ENCOUNTER — Ambulatory Visit: Payer: 59 | Attending: Adult Health

## 2021-03-14 ENCOUNTER — Other Ambulatory Visit: Payer: Self-pay

## 2021-03-14 DIAGNOSIS — R2681 Unsteadiness on feet: Secondary | ICD-10-CM

## 2021-03-14 DIAGNOSIS — R42 Dizziness and giddiness: Secondary | ICD-10-CM

## 2021-03-14 NOTE — Therapy (Signed)
[facility] [address] [place], [place], [address] Phone: [phone]   Fax:  [phone]  Physical Therapy Treatment  Patient Details  Name: EARMA NICOLAOU MRN: [id number] Date of Birth: 1986/10/08 Referring Provider (PT): Ward Givens, NP   Encounter Date: 03/14/2021   PT End of Session - 03/14/21 1700    Visit Number 7    Number of Visits 9    Date for PT Re-Evaluation 03/22/21    Authorization Type UHC (VL: 90)    PT Start Time 1700    PT Stop Time 1742    PT Time Calculation (min) 42 min    Equipment Utilized During Treatment Other (comment)   S prn   Activity Tolerance Patient tolerated treatment well    Behavior During Therapy [employer] for tasks assessed/performed           Past Medical History:  Diagnosis Date  . 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 Allergy   . Bradycardia   . Cardiac arrest ([place]) 08/31/2014  . Crohn disease ([place])   . Depression   . History of blood transfusion 09/2012   "had allergic rxn to one of my Crohn's RX"   . Migraine    "a few times/yr" (06/08/2015)  . Pancytopenia ([place]) 11/23/2012  . Status post radiofrequency ablation for arrhythmia, atrial tach 06/08/15 06/08/2015  . SVT (supraventricular tachycardia) ([place]) 12/08/2014   likely an atrial tachycardia with CL 250 msec    Past Surgical History:  Procedure Laterality Date  . ABLATION    . ELECTROPHYSIOLOGIC STUDY N/A 06/08/2015   Procedure: SVT Ablation;  Surgeon: Thompson Grayer, MD;  Location: [facility];  Service: Cardiovascular;  Laterality: N/A;  . ELECTROPHYSIOLOGY STUDY N/A 09/11/2014   Procedure: ELECTROPHYSIOLOGY STUDY;  Surgeon: Deboraha Sprang, MD;  Location: [facility] CATH LAB;  Service: Cardiovascular;  Laterality: N/A;  . IMPLANTABLE CARDIOVERTER DEFIBRILLATOR IMPLANT N/A 09/13/2014   Procedure: IMPLANTABLE CARDIOVERTER  DEFIBRILLATOR IMPLANT;  Surgeon: Deboraha Sprang, MD;  Location: [facility] CATH LAB;  Service: Cardiovascular;  Laterality: N/A;; STJ dual chamber ICD implanted by Dr Caryl Comes for aborted cardiac arrest    There were no vitals filed for this visit.   Subjective Assessment - 03/14/21 1703    Subjective Patient reports no new changes. Did see cardiologist recently. is going to start taking a log of BP morning/night. Mild lightheaded sensation currently.    Pertinent History Migraine, Vertigo, Orthostatics, AICD, Hx of Cardiac Arrest, Crohns Disease, Depression, Bradycardia    Patient Stated Goals Resolve the Dizziness; Be able to manage the dizziness.    Currently in Pain? No/denies               [facility] Adult PT Treatment/Exercise - 03/14/21 0001      Standardized Balance Assessment   Standardized Balance Assessment Balance Master Testing            Neuro re-ed: sensory organization test performed with following results: Conditions: 1: all above age related norms 2: all above age related norms 3: 1st and 3rd trial above age related norm, 2nd below age related norm  38: all above age related norms 5: 1st trial below age related norm, 81nd and 74rd above age related norms 76: all above age related norms Composite score: 76% Sensory Analysis Som: above age related norm (58) Vis: above age related norms (19) Vest: above age related norms (68)  Pref: 100% Strategy analysis: WNL    COG alignment: WNL          Balance Exercises - 03/14/21 0001      Balance Exercises: Standing   Tandem Stance Eyes open;Foam/compliant surface;Intermittent upper extremity support;2 reps;Limitations    Tandem Stance Time completed horizontal/vertical head turns 2 x 10 reps each alternating foot position with tandem. increased challenge with vertical > horizontal    Other Standing Exercises completed activites on inverted BOSU: completed standing with feet shoulder width apart completed horizontal/vertical head  turns x 10 reps each direction, then progressed to eyes closed 3 x 30 seconds, followed by eyes closed and weight shift A/P and lateral x 15 reps each. increased challenge with vision removed.             PT Education - 03/14/21 1705    Education Details SOT Results    Person(s) Educated Patient    Methods Explanation;Handout    Comprehension Verbalized understanding            PT Short Term Goals - 02/19/21 1414      PT SHORT TERM GOAL #1   Title Patient will be independent with inital vestibular/balance HEP (ALL STGS Due: 02/22/21)    Baseline reports independence    Time 4    Period Weeks    Status Achieved    Target Date 02/22/21      PT SHORT TERM GOAL #2   Title Patient will undergo further vestibular/balance assesment (Orthostatics/SOT) and LTG to be set as appropraite    Baseline 01/31/21 Pt was not orthostatic at visit. SOT was performed.    Time 4    Period Weeks    Status Achieved             PT Long Term Goals - 03/14/21 1726      PT LONG TERM GOAL #1   Title Patient will be independent with final vestibular/balance HEP (ALL LTGs Due: 03/22/21)    Baseline no HEP established    Time 8    Period Weeks    Status New      PT LONG TERM GOAL #2   Title Pt will report increase DPS to >/= 50% and DFS >/= 55 on FOTO    Baseline DPS: 45; DFS: 52.3    Time 8    Period Weeks    Status New      PT LONG TERM GOAL #3   Title Pt will increase composite score to >60 for improved visual and vestibular function.    Baseline 01/31/21 51; 76 on 03/14/21    Time 8    Period Weeks    Status Achieved      PT LONG TERM GOAL #4   Title Patient will improve report 50% improvement in dizziness with functional mobility including supine <> sit, sit <> stand, and with head turns.    Baseline mild dizziness reported with functional movements    Time 8    Period Weeks    Status New                 Plan - 03/14/21 1749    Clinical Impression Statement Today's  skilled PT session included reassesment of Sensory Organization Test. Patient improving composite score to 76%. Patient demosntrating all balance systems above age related norms demonstrating improvement with PT services. Continued rest of session focused on balance activites on complaint surface and vision removed. Will continue to progress toward all LTGs.  Personal Factors and Comorbidities Comorbidity 3+;Time since onset of injury/illness/exacerbation    Comorbidities Patient is a 35 y.o. female referred to Neuro OPPT for Vertigo. Patient's PMH significant for the following: Migraine, Vertigo, Orthostatics, AICD, Hx of Cardiac Arrest, Crohns Disease, Depression, Bradycardia. Upon evaluation patient presents with the following impairments: Dizziness,   Patient will benefit from skilled PT services to address the following impairments    Examination-Activity Limitations Bend;Bed Mobility;Stand    Examination-Participation Restrictions Cleaning;Community Activity    Stability/Clinical Decision Making Stable/Uncomplicated    Rehab Potential Fair    PT Frequency 1x / week    PT Duration 8 weeks    PT Treatment/Interventions ADLs/Self Care Home Management;Aquatic Therapy;Canalith Repostioning;Cryotherapy;Electrical Stimulation;Moist Heat;Gait training;Stair training;Functional mobility training;Therapeutic activities;Therapeutic exercise;Balance training;Neuromuscular re-education;Patient/family education;Dry needling;Passive range of motion;Vestibular;Manual techniques    PT Next Visit Plan Check LTGs and Re-Cert for 1x/week for 4 weeks. Continue to progress VOR x 1. Saccades. Visual Tracking. Figure8. Continue to work on balance on compliant surfaces to work visual and vestibular systems more.    PT [place]: [id number]    Consulted and Agree with Plan of Care Patient           Patient will benefit from skilled therapeutic intervention in order to improve the following deficits  and impairments:  Dizziness,Decreased activity tolerance,Decreased balance  Visit Diagnosis: Dizziness and giddiness  Unsteadiness on feet     Problem List Patient Active Problem List   Diagnosis Date Noted  . 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 Dual implantable cardioverter-defibrillator in situ 09/22/2018  . Crohn's disease of both small and large intestine without complication ([place]) 01/29/2247  . Status post radiofrequency ablation for arrhythmia, atrial tach 06/08/15 06/08/2015  . SVT (supraventricular tachycardia) ([place]) 04/16/2015  . Defibrillator discharge   . VT (ventricular tachycardia) ([place])   . Chronic systolic heart failure ([place]) 09/14/2014  . Ventricular fibrillation ([place]) 09/11/2014  . Sinus bradycardia 09/11/2014  . Cardiomyopathy ([place]) 09/11/2014  . Acute renal failure ([place]) 09/08/2014  . Crohn's duodenitis ([place]) 09/08/2014  . Prolonged QT interval 09/08/2014  . Anemia 09/08/2014  . 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 Cardiac arrest ([place]) 08/31/2014  . Hypokalemia 08/31/2014  . Acute respiratory failure with hypoxia ([place]) 08/31/2014  . Pancytopenia ([place]) 11/23/2012    Jones Bales, PT, DPT 03/14/2021, 5:52 PM  [facility] [address] [place], [place], [address] Phone: [phone]   Fax:  [phone]  Name: HARLEY MCCARTNEY MRN: [id number] Date of Birth: September 16, 1986

## 2021-03-19 NOTE — Progress Notes (Signed)
Remote ICD transmission.   

## 2021-03-21 ENCOUNTER — Other Ambulatory Visit: Payer: Self-pay

## 2021-03-21 ENCOUNTER — Ambulatory Visit: Payer: 59

## 2021-03-21 DIAGNOSIS — R42 Dizziness and giddiness: Secondary | ICD-10-CM

## 2021-03-21 DIAGNOSIS — R2681 Unsteadiness on feet: Secondary | ICD-10-CM

## 2021-03-21 NOTE — Therapy (Signed)
[facility] [address] [place], [place], [address] Phone: [phone]   Fax:  [phone]  Physical Therapy Treatment/Re-Certification  Patient Details  Name: Julie Saunders MRN: [id number] Date of Birth: 1986/07/05 Referring Provider (PT): Ward Givens, NP   Encounter Date: 03/21/2021   PT End of Session - 03/21/21 1701    Visit Number 8    Number of Visits 14    Date for PT Re-Evaluation 05/07/21   pushed out due to delay in scheduling   Authorization Type UHC (VL: 90)    PT Start Time 1700    PT Stop Time 1745    PT Time Calculation (min) 45 min    Equipment Utilized During Treatment Other (comment)   S prn   Activity Tolerance Patient tolerated treatment well    Behavior During Therapy [facility] for tasks assessed/performed           Past Medical History:  Diagnosis Date  . 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 Allergy   . Bradycardia   . Cardiac arrest ([place]) 08/31/2014  . Crohn disease ([place])   . Depression   . History of blood transfusion 09/2012   "had allergic rxn to one of my Crohn's RX"   . Migraine    "a few times/yr" (06/08/2015)  . Pancytopenia ([place]) 11/23/2012  . Status post radiofrequency ablation for arrhythmia, atrial tach 06/08/15 06/08/2015  . SVT (supraventricular tachycardia) ([place]) 12/08/2014   likely an atrial tachycardia with CL 250 msec    Past Surgical History:  Procedure Laterality Date  . ABLATION    . ELECTROPHYSIOLOGIC STUDY N/A 06/08/2015   Procedure: SVT Ablation;  Surgeon: Thompson Grayer, MD;  Location: [facility];  Service: Cardiovascular;  Laterality: N/A;  . ELECTROPHYSIOLOGY STUDY N/A 09/11/2014   Procedure: ELECTROPHYSIOLOGY STUDY;  Surgeon: Deboraha Sprang, MD;  Location: [facility] CATH LAB;  Service: Cardiovascular;  Laterality: N/A;  . IMPLANTABLE CARDIOVERTER DEFIBRILLATOR  IMPLANT N/A 09/13/2014   Procedure: IMPLANTABLE CARDIOVERTER DEFIBRILLATOR IMPLANT;  Surgeon: Deboraha Sprang, MD;  Location: [facility] CATH LAB;  Service: Cardiovascular;  Laterality: N/A;; STJ dual chamber ICD implanted by Dr Caryl Comes for aborted cardiac arrest    There were no vitals filed for this visit.   Subjective Assessment - 03/21/21 1703    Subjective Patient reports that monday began to have increased HA and sinus congestion due to weather changes. This has lead to increased pressure in the L ear and and increased dizziness. No HA currently. Baseline dizziness approx 3/10.    Pertinent History Migraine, Vertigo, Orthostatics, AICD, Hx of Cardiac Arrest, Crohns Disease, Depression, Bradycardia    Patient Stated Goals Resolve the Dizziness; Be able to manage the dizziness.    Currently in Pain? No/denies              [facility] PT Assessment - 03/21/21 0001      Assessment   Medical Diagnosis Vertigo    Referring Provider (PT) Ward Givens, NP      Observation/Other Assessments   Focus on Therapeutic Outcomes (FOTO)  DPS: 50; DFS: 52.3            Vestibular Assessment - 03/21/21 0001      Positional Sensitivities   Sit to Supine Lightheadedness    Supine to Left Side No dizziness    Supine to Right Side No dizziness    Supine to Sitting Mild  dizziness    Right Hallpike No dizziness    Up from Right Hallpike Lightheadedness    Up from Left Hallpike Lightheadedness    Nose to Right Knee Lightheadedness    Right Knee to Sitting Mild dizziness    Nose to Left Knee Lightheadedness    Left Knee to Sitting Mild dizziness    Head Turning x 5 Lightheadedness    Head Nodding x 5 Mild dizziness   continue to demo increased motion sensitivity with vertical > horizontal   Pivot Right in Standing No dizziness    Pivot Left in Standing No dizziness    Rolling Right No dizziness    Rolling Left No dizziness            Vestibular Treatment/Exercise - 03/21/21 0001      Vestibular  Treatment/Exercise   Vestibular Treatment Provided Gaze    Habituation Exercises Nestor Lewandowsky    Gaze Exercises X1 Viewing Horizontal;X1 Viewing Vertical      Nestor Lewandowsky   Number of Reps  5   to bilateral directions   Symptom Description  intermittent rest breaks required to allow for symptoms to return to baseline      X1 Viewing Horizontal   Foot Position standing feet apart (patterned background)    Reps 2    Comments x 30 seconds. mild dizziness      X1 Viewing Vertical   Foot Position standing feet apart (patterned background)    Reps 2    Comments x 30 seconds. moderate dizziness           Gaze Stabilization: Standing Feet Apart    Feet shoulder width apart, keeping eyes on target on wall 3-4 feet away, tilt head down 15-30 and move head side to side for 30-45 seconds. Repeat while moving head up and down for 30-45 seconds. Do 2-3 sessions per day. Repeat using target on pattern background.  Copyright  VHI. All rights reserved.    Completed all of the following exercises and reviewed HEP. Patient require intermittent rest breaks to allow for symptoms to return to baseline with Habituation exercises.   Access Code: [email or web address] URL: [email or web address] Date: 03/21/2021 Prepared by: Baldomero Lamy  Exercises Brandt-Daroff Vestibular Exercise - 1 x daily - 5 x weekly - 1 sets - 5 reps Seated Nose to Right Knee Vestibular Habituation - 2 x daily - 5 x weekly - 2 sets - 5 reps Seated Nose to Left Knee Vestibular Habituation - 2 x daily - 5 x weekly - 2 sets - 5 reps Romberg Stance Eyes Closed on Foam Pad - 1 x daily - 5 x weekly - 1 sets - 3 reps - 10-30 hold Romberg Stance with Head Nods on Foam Pad - 1 x daily - 7 x weekly - 2 sets - 10 reps Romberg Stance on Foam Pad with Head Rotation - 1 x daily - 7 x weekly - 3 sets - 10 reps Tandem Walking - 1 x daily - 7 x weekly - 3 sets        PT Education - 03/21/21 1704    Education Details Updated  POC; Updated HEP    Person(s) Educated Patient    Methods Explanation    Comprehension Verbalized understanding            PT Short Term Goals - 02/19/21 1414      PT SHORT TERM GOAL #1   Title Patient will be independent with inital vestibular/balance HEP (ALL STGS Due: 02/22/21)  Baseline reports independence    Time 4    Period Weeks    Status Achieved    Target Date 02/22/21      PT SHORT TERM GOAL #2   Title Patient will undergo further vestibular/balance assesment (Orthostatics/SOT) and LTG to be set as appropraite    Baseline 01/31/21 Pt was not orthostatic at visit. SOT was performed.    Time 4    Period Weeks    Status Achieved             PT Long Term Goals - 03/21/21 1714      PT LONG TERM GOAL #1   Title Patient will be independent with final vestibular/balance HEP (ALL LTGs Due: 03/22/21)    Baseline continue to progress HEP    Time 8    Period Weeks    Status On-going      PT LONG TERM GOAL #2   Title Pt will report increase DPS to >/= 50% and DFS >/= 55 on FOTO    Baseline DPS: 45; DFS: 52.3; 03/21/21: DPS: 50; DFS: 52.3    Time 8    Period Weeks    Status Partially Met      PT LONG TERM GOAL #3   Title Pt will increase composite score to >60 for improved visual and vestibular function.    Baseline 01/31/21 51; 76 on 03/14/21    Time 8    Period Weeks    Status Achieved      PT LONG TERM GOAL #4   Title Patient will improve report 50% improvement in dizziness with functional mobility including supine <> sit, sit <> stand, and with head turns.    Baseline reports 50% improvements with functional tasks    Time 8    Period Weeks    Status Achieved           Updated Long Term Goals:   PT Long Term Goals - 03/21/21 1902      PT LONG TERM GOAL #1   Title Patient will be independent with final vestibular/balance HEP (ALL LTGs Due: 05/07/21)    Baseline will continue to benefit from progressive HEP    Time 6    Period Weeks    Status On-going     Target Date 05/07/21      PT LONG TERM GOAL #2   Title Pt will improve DFS >/= 55 on FOTO    Baseline DPS: 45; DFS: 52.3; 03/21/21: DPS: 50; DFS: 52.3    Time 6    Period Weeks    Status Revised      PT LONG TERM GOAL #3   Title Patient will report </= 1 on all components of MSQ to demonstrate reduced motion sensitivity    Baseline see flowsheet    Time 6    Period Weeks    Status Revised      PT LONG TERM GOAL #4   Title Patient report ability to return to dance activities and maintain dizziness </= 5/10 for improved activity tolerance    Baseline unable to complete w/o significant dizziness    Time 6    Period Weeks    Status Revised                Plan - 03/21/21 1859    Clinical Impression Statement Assesment of LTG completed, with patient able to meet LTG #3 and 4, demonstrating improved vsetibular input for balance and improved reported symptoms in dizziness. With completion of  MSQ, patietn continue to demonstrating continued increased motion sensitvity with  positional changes and vertical head movements. Reviewed and progressed HEP to patient's tolerance. Patient is making steady progress with PT services and will continue to benefit from skilled PT to address impairments.    Personal Factors and Comorbidities Comorbidity 3+;Time since onset of injury/illness/exacerbation    Comorbidities Patient is a 35 y.o. female referred to Neuro OPPT for Vertigo. Patient's PMH significant for the following: Migraine, Vertigo, Orthostatics, AICD, Hx of Cardiac Arrest, Crohns Disease, Depression, Bradycardia. Upon evaluation patient presents with the following impairments: Dizziness,   Patient will benefit from skilled PT services to address the following impairments    Examination-Activity Limitations Bend;Bed Mobility;Stand    Examination-Participation Restrictions Cleaning;Community Activity    Stability/Clinical Decision Making Stable/Uncomplicated    Rehab Potential Fair    PT  Frequency 1x / week    PT Duration 6 weeks    PT Treatment/Interventions ADLs/Self Care Home Management;Aquatic Therapy;Canalith Repostioning;Cryotherapy;Electrical Stimulation;Moist Heat;Gait training;Stair training;Functional mobility training;Therapeutic activities;Therapeutic exercise;Balance training;Neuromuscular re-education;Patient/family education;Dry needling;Passive range of motion;Vestibular;Manual techniques    PT Next Visit Plan How was updated HEP? Continue to progress VOR x 1. Habituation to Vertical head turns, sidelying to sit, and reaching/bending. Visual Tracking. Continue to work on balance on compliant surfaces to work visual and vestibular systems more.    PT [place]: [id number]    Consulted and Agree with Plan of Care Patient           Patient will benefit from skilled therapeutic intervention in order to improve the following deficits and impairments:  Dizziness,Decreased activity tolerance,Decreased balance  Visit Diagnosis: Dizziness and giddiness  Unsteadiness on feet     Problem List Patient Active Problem List   Diagnosis Date Noted  . 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 Dual implantable cardioverter-defibrillator in situ 09/22/2018  . Crohn's disease of both small and large intestine without complication ([place]) 04/59/9774  . Status post radiofrequency ablation for arrhythmia, atrial tach 06/08/15 06/08/2015  . SVT (supraventricular tachycardia) ([place]) 04/16/2015  . Defibrillator discharge   . VT (ventricular tachycardia) ([place])   . Chronic systolic heart failure ([place]) 09/14/2014  . Ventricular fibrillation ([place]) 09/11/2014  . Sinus bradycardia 09/11/2014  . Cardiomyopathy ([place]) 09/11/2014  . Acute renal failure ([place]) 09/08/2014  . Crohn's duodenitis ([place]) 09/08/2014  . Prolonged QT interval 09/08/2014  . Anemia 09/08/2014  . 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 Cardiac arrest ([place]) 08/31/2014  . Hypokalemia  08/31/2014  . Acute respiratory failure with hypoxia ([place]) 08/31/2014  . Pancytopenia ([place]) 11/23/2012    Jones Bales, PT, DPT 03/21/2021, 7:02 PM  [facility] [address] [place], [place], [address] Phone: [phone]   Fax:  [phone]  Name: ALEXIYA FRANQUI MRN: [id number] Date of Birth: 1986/04/11

## 2021-03-21 NOTE — Patient Instructions (Addendum)
Gaze Stabilization: Standing Feet Apart    Feet shoulder width apart, keeping eyes on target on wall 3-4 feet away, tilt head down 15-30 and move head side to side for 30-45 seconds. Repeat while moving head up and down for 30-45 seconds. Do 2-3 sessions per day. Repeat using target on pattern background.  Copyright  VHI. All rights reserved.    Access Code: [email or web address] URL: [email or web address] Date: 03/21/2021 Prepared by: Baldomero Lamy  Exercises Brandt-Daroff Vestibular Exercise - 1 x daily - 5 x weekly - 1 sets - 5 reps Seated Nose to Right Knee Vestibular Habituation - 2 x daily - 5 x weekly - 2 sets - 5 reps Seated Nose to Left Knee Vestibular Habituation - 2 x daily - 5 x weekly - 2 sets - 5 reps Romberg Stance Eyes Closed on Foam Pad - 1 x daily - 5 x weekly - 1 sets - 3 reps - 10-30 hold Romberg Stance with Head Nods on Foam Pad - 1 x daily - 7 x weekly - 2 sets - 10 reps Romberg Stance on Foam Pad with Head Rotation - 1 x daily - 7 x weekly - 3 sets - 10 reps Tandem Walking - 1 x daily - 7 x weekly - 3 sets

## 2021-03-25 ENCOUNTER — Telehealth (HOSPITAL_COMMUNITY): Payer: Self-pay | Admitting: *Deleted

## 2021-03-25 NOTE — Telephone Encounter (Signed)
Contacted patient to discuss individualized exercise program. She is currently sick with sinus congestion and discomfort but plans to begin her exercise next week after recovering this week. Patient verbalized clear readiness to begin the program understanding and gratitude for the support. Emailed the below exercise prescription plan with requests for her to complete short and long-term goals.   CEP plan to follow-up with patient May 31st.      Coleston Dirosa, MS, ACSM, [facility] Clinical Exercise Physiologist/ Health and Wellness Coach

## 2021-04-01 ENCOUNTER — Ambulatory Visit: Payer: 59 | Admitting: Physical Therapy

## 2021-04-01 ENCOUNTER — Other Ambulatory Visit: Payer: Self-pay

## 2021-04-01 VITALS — BP 119/87 | HR 73

## 2021-04-01 DIAGNOSIS — R42 Dizziness and giddiness: Secondary | ICD-10-CM

## 2021-04-01 DIAGNOSIS — R2681 Unsteadiness on feet: Secondary | ICD-10-CM

## 2021-04-01 NOTE — Therapy (Signed)
[facility] [address] [place], [place], [address] Phone: [phone]   Fax:  [phone]  Physical Therapy Treatment  Patient Details  Name: Julie Saunders MRN: [id number] Date of Birth: 1985-11-16 Referring Provider (PT): Ward Givens, NP   Encounter Date: 04/01/2021   PT End of Session - 04/01/21 1540    Visit Number 9    Number of Visits 14    Date for PT Re-Evaluation 05/07/21   pushed out due to delay in scheduling   Authorization Type UHC (VL: 90)    PT Start Time 1538    PT Stop Time 1616    PT Time Calculation (min) 38 min    Activity Tolerance Patient tolerated treatment well    Behavior During Therapy [facility] for tasks assessed/performed           Past Medical History:  Diagnosis Date  . 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 Allergy   . Bradycardia   . Cardiac arrest ([place]) 08/31/2014  . Crohn disease ([place])   . Depression   . History of blood transfusion 09/2012   "had allergic rxn to one of my Crohn's RX"   . Migraine    "a few times/yr" (06/08/2015)  . Pancytopenia ([place]) 11/23/2012  . Status post radiofrequency ablation for arrhythmia, atrial tach 06/08/15 06/08/2015  . SVT (supraventricular tachycardia) ([place]) 12/08/2014   likely an atrial tachycardia with CL 250 msec    Past Surgical History:  Procedure Laterality Date  . ABLATION    . ELECTROPHYSIOLOGIC STUDY N/A 06/08/2015   Procedure: SVT Ablation;  Surgeon: Thompson Grayer, MD;  Location: [place] CV LAB;  Service: Cardiovascular;  Laterality: N/A;  . ELECTROPHYSIOLOGY STUDY N/A 09/11/2014   Procedure: ELECTROPHYSIOLOGY STUDY;  Surgeon: Deboraha Sprang, MD;  Location: [facility] CATH LAB;  Service: Cardiovascular;  Laterality: N/A;  . IMPLANTABLE CARDIOVERTER DEFIBRILLATOR IMPLANT N/A 09/13/2014   Procedure: IMPLANTABLE CARDIOVERTER DEFIBRILLATOR  IMPLANT;  Surgeon: Deboraha Sprang, MD;  Location: [facility] CATH LAB;  Service: Cardiovascular;  Laterality: N/A;; STJ dual chamber ICD implanted by Dr Caryl Comes for aborted cardiac arrest    Vitals:   04/01/21 1544  BP: 119/87  Pulse: 73  SpO2: 99%     Subjective Assessment - 04/01/21 1541    Subjective Pt has not had a chance to do new exercises due to ongoing allergies and cold.  That is improving.   No dizziness at rest today.    Pertinent History Migraine, Vertigo, Orthostatics, AICD, Hx of Cardiac Arrest, Crohns Disease, Depression, Bradycardia    Patient Stated Goals Resolve the Dizziness; Be able to manage the dizziness.                              Vestibular Treatment/Exercise - 04/01/21 1545      Vestibular Treatment/Exercise   Vestibular Treatment Provided Gaze    Habituation Exercises Standing Vertical Head Turns;Standing Diagonal Head Turns;Seated Horizontal Head Turns;Seated Vertical Head Turns    Gaze Exercises X1 Viewing Horizontal;X1 Viewing Vertical      Seated Horizontal Head Turns   Number of Reps  10    Symptom Description  Seated on physioball while performing bouncing with EO      Seated Vertical Head Turns   Number of Reps  10    Symptom Description  seated on physioball performed 2 minutes  bouncing with eyes fixed on target >> bouncing with EO with 10 reps head nods >> bouncing with EC x 2 minutes with head stationary.  Pt reported very mild symptoms after each set and was able to self correct mild shift to R when performing with EC      Standing Vertical Head Turns   Number of Reps  9   x2 sets   Symptom Description  mild symptoms, reaching counter <> floor to place 9 cones x 2 sets      Standing Diagonal Head Turns   Number of Reps  9   4 sets walking forwards and then backwards reaching diagonally to R and then L floor <> counter to place cones.  Mild symptoms after each set, slightly more intense after backwards walking     X1 Viewing  Horizontal   Foot Position standing feet apart, busy background    Reps 2    Comments 30 seconds > 60 seconds, mild symptoms      X1 Viewing Vertical   Foot Position standing feet apart (patterned background)    Reps 2    Comments kept at 30 seconds due to mild symptoms                 PT Education - 04/01/21 1623    Education Details progressed time for horizontal VOR x1 viewing; no change to vertical VOR    Person(s) Educated Patient    Methods Explanation;Demonstration    Comprehension Verbalized understanding;Returned demonstration            PT Short Term Goals - 04/01/21 1628      PT SHORT TERM GOAL #1   Title = LTG             PT Long Term Goals - 03/21/21 1902      PT LONG TERM GOAL #1   Title Patient will be independent with final vestibular/balance HEP (ALL LTGs Due: 05/07/21)    Baseline will continue to benefit from progressive HEP    Time 6    Period Weeks    Status On-going    Target Date 05/07/21      PT LONG TERM GOAL #2   Title Pt will improve DFS >/= 55 on FOTO    Baseline DPS: 45; DFS: 52.3; 03/21/21: DPS: 50; DFS: 52.3    Time 6    Period Weeks    Status Revised      PT LONG TERM GOAL #3   Title Patient will report </= 1 on all components of MSQ to demonstrate reduced motion sensitivity    Baseline see flowsheet    Time 6    Period Weeks    Status Revised      PT LONG TERM GOAL #4   Title Patient report ability to return to dance activities and maintain dizziness </= 5/10 for improved activity tolerance    Baseline unable to complete w/o significant dizziness    Time 6    Period Weeks    Status Revised                 Plan - 04/01/21 1624    Clinical Impression Statement Did not adjust or progress most of HEP due to pt not being able to perform since last visit due to illness.  Pt did report improvement in symptoms with horizontal VOR x1; PT able to progress time for HEP.  Continued to perform habituation training with  functional reaching high <> low  and adding in turns as well habituation for otolithic organ function with vertical displacement.  Pt tolerated well with only mild symptoms.  Will continue to address and progress towards LTG.    Personal Factors and Comorbidities Comorbidity 3+;Time since onset of injury/illness/exacerbation    Comorbidities Patient is a 35 y.o. female referred to Neuro OPPT for Vertigo. Patient's PMH significant for the following: Migraine, Vertigo, Orthostatics, AICD, Hx of Cardiac Arrest, Crohns Disease, Depression, Bradycardia. Upon evaluation patient presents with the following impairments: Dizziness,   Patient will benefit from skilled PT services to address the following impairments    Examination-Activity Limitations Bend;Bed Mobility;Stand    Examination-Participation Restrictions Cleaning;Community Activity    Stability/Clinical Decision Making Stable/Uncomplicated    Rehab Potential Fair    PT Frequency 1x / week    PT Duration 6 weeks    PT Treatment/Interventions ADLs/Self Care Home Management;Aquatic Therapy;Canalith Repostioning;Cryotherapy;Electrical Stimulation;Moist Heat;Gait training;Stair training;Functional mobility training;Therapeutic activities;Therapeutic exercise;Balance training;Neuromuscular re-education;Patient/family education;Dry needling;Passive range of motion;Vestibular;Manual techniques    PT Next Visit Plan Check tolerance to HEP.  Continue to progress VOR x 1. Habituation to Vertical head turns, sidelying to sit, and reaching/bending. Bouncing on physioball performing head movements with EO/EC, visual tracking, etc. Continue to work on balance on compliant surfaces to work visual and vestibular systems more.    PT [place]: [email or web address]    Consulted and Agree with Plan of Care Patient           Patient will benefit from skilled therapeutic intervention in order to improve the following deficits and impairments:   Dizziness,Decreased activity tolerance,Decreased balance  Visit Diagnosis: Dizziness and giddiness  Unsteadiness on feet     Problem List Patient Active Problem List   Diagnosis Date Noted  . 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 Dual implantable cardioverter-defibrillator in situ 09/22/2018  . Crohn's disease of both small and large intestine without complication ([place]) 37/48/2707  . Status post radiofrequency ablation for arrhythmia, atrial tach 06/08/15 06/08/2015  . SVT (supraventricular tachycardia) ([place]) 04/16/2015  . Defibrillator discharge   . VT (ventricular tachycardia) ([place])   . Chronic systolic heart failure ([place]) 09/14/2014  . Ventricular fibrillation ([place]) 09/11/2014  . Sinus bradycardia 09/11/2014  . Cardiomyopathy ([place]) 09/11/2014  . Acute renal failure ([place]) 09/08/2014  . Crohn's duodenitis ([place]) 09/08/2014  . Prolonged QT interval 09/08/2014  . Anemia 09/08/2014  . 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 Cardiac arrest ([place]) 08/31/2014  . Hypokalemia 08/31/2014  . Acute respiratory failure with hypoxia ([place]) 08/31/2014  . Pancytopenia ([place]) 11/23/2012    Rico Junker, PT, DPT 04/01/21    4:29 PM    [facility] [address] [place], [place], [address] Phone: [phone]   Fax:  [phone]  Name: Julie Saunders MRN: [id number] Date of Birth: [birth year]

## 2021-04-01 NOTE — Patient Instructions (Signed)
Gaze Stabilization: Standing Feet Apart    Place your letter on a mirror for busier background.  Feet shoulder width apart, keeping eyes on target on wall 3-4 feet away, tilt head down 15-30 and move head side to side for 60 seconds. Repeat while moving head up and down for 30 seconds. Do 2-3 sessions per day.     Access Code: [email or web address] URL: [email or web address] Date: 03/21/2021 Prepared by: Baldomero Lamy  Exercises Brandt-Daroff Vestibular Exercise - 1 x daily - 5 x weekly - 1 sets - 5 reps Seated Nose to Right Knee Vestibular Habituation - 2 x daily - 5 x weekly - 2 sets - 5 reps Seated Nose to Left Knee Vestibular Habituation - 2 x daily - 5 x weekly - 2 sets - 5 reps Romberg Stance Eyes Closed on Foam Pad - 1 x daily - 5 x weekly - 1 sets - 3 reps - 10-30 hold Romberg Stance with Head Nods on Foam Pad - 1 x daily - 7 x weekly - 2 sets - 10 reps Romberg Stance on Foam Pad with Head Rotation - 1 x daily - 7 x weekly - 3 sets - 10 reps Tandem Walking - 1 x daily - 7 x weekly - 3 sets

## 2021-04-09 ENCOUNTER — Telehealth (HOSPITAL_COMMUNITY): Payer: Self-pay | Admitting: *Deleted

## 2021-04-09 NOTE — Telephone Encounter (Signed)
Contacted patient to follow-up regarding her home exercise programming and coaching.   Patient responded with the below to my email in regards to her Short-term and long-term goals. She also stated that she is feeling better than last week when she was congested. She hasn't been able to make it to the gym yet to open and start working with the equipment due to the rain, feeling tired from being under the weather last week and being sore. She managed to get some walking in this past weekend when she was out of town on vacation. Went to a few retail stores walked the entire stores multiple times and did some speed/fast walking, states her heart rate increased and she had to stop to catch her breath. Next day legs were so sore. She is undergoing Physical therapy for vertigo. exercise on the balance ball. Legs were sore from the bouncing. Trying to work in the at home excersises this week.   Goal is to get to the gym next week, sign up membership and workout on equipment.   1)Short Term Goals: Help strengthen heart muscle back up with exercise. Work on strengthing/muscle tone. Hopefully exercise will decrease fatigue and have more energy.   2)Long Term Goals:Continue to strengthen heart muscle. Loose weight-get down to at least 160 lbs. Keep working on decreasing fatigue. 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 Would like to have enough arm strength and muscle tone built up to get a bow for archery.    Will follow-up with patient June 23rd- she will join [employer] and begin her home exercise program with individualized exercise prescription (emailed and same as last documentation 5/16).     Landis Martins, MS, ACSM, [facility] Clinical Exercise Physiologist/ Health and Wellness Coach

## 2021-04-11 ENCOUNTER — Ambulatory Visit: Payer: 59 | Attending: Adult Health

## 2021-04-11 ENCOUNTER — Other Ambulatory Visit: Payer: Self-pay

## 2021-04-11 DIAGNOSIS — R42 Dizziness and giddiness: Secondary | ICD-10-CM | POA: Insufficient documentation

## 2021-04-11 DIAGNOSIS — R2681 Unsteadiness on feet: Secondary | ICD-10-CM

## 2021-04-11 NOTE — Therapy (Signed)
[facility] [address] [place], [place], [address] Phone: [phone]   Fax:  [phone]  Physical Therapy Treatment  Patient Details  Name: Julie Saunders MRN: [id number] Date of Birth: 1986-05-31 Referring Provider (PT): Ward Givens, NP   Encounter Date: 04/11/2021   PT End of Session - 04/11/21 1535    Visit Number 10    Number of Visits 14    Date for PT Re-Evaluation 05/07/21   pushed out due to delay in scheduling   Authorization Type UHC (VL: 90)    PT Start Time 1535   patient arriving late   PT Stop Time 1612    PT Time Calculation (min) 37 min    Activity Tolerance Patient tolerated treatment well    Behavior During Therapy [facility] for tasks assessed/performed           Past Medical History:  Diagnosis Date  . 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 Allergy   . Bradycardia   . Cardiac arrest ([place]) 08/31/2014  . Crohn disease ([place])   . Depression   . History of blood transfusion 09/2012   "had allergic rxn to one of my Crohn's RX"   . Migraine    "a few times/yr" (06/08/2015)  . Pancytopenia ([place]) 11/23/2012  . Status post radiofrequency ablation for arrhythmia, atrial tach 06/08/15 06/08/2015  . SVT (supraventricular tachycardia) ([place]) 12/08/2014   likely an atrial tachycardia with CL 250 msec    Past Surgical History:  Procedure Laterality Date  . ABLATION    . ELECTROPHYSIOLOGIC STUDY N/A 06/08/2015   Procedure: SVT Ablation;  Surgeon: Thompson Grayer, MD;  Location: [facility] CV LAB;  Service: Cardiovascular;  Laterality: N/A;  . ELECTROPHYSIOLOGY STUDY N/A 09/11/2014   Procedure: ELECTROPHYSIOLOGY STUDY;  Surgeon: Deboraha Sprang, MD;  Location: [facility] CATH LAB;  Service: Cardiovascular;  Laterality: N/A;  . IMPLANTABLE CARDIOVERTER DEFIBRILLATOR IMPLANT N/A 09/13/2014   Procedure: IMPLANTABLE  CARDIOVERTER DEFIBRILLATOR IMPLANT;  Surgeon: Deboraha Sprang, MD;  Location: [facility] CATH LAB;  Service: Cardiovascular;  Laterality: N/A;; STJ dual chamber ICD implanted by Dr Caryl Comes for aborted cardiac arrest    There were no vitals filed for this visit.   Subjective Assessment - 04/11/21 1537    Subjective Reports that the allergies/cold has ran its course and is better. Reports exercies going well overall. Has noticed dizziness with driving when she is checking blind spot with head turns. No dizziness at rest currently, just lightheadedness.    Pertinent History Migraine, Vertigo, Orthostatics, AICD, Hx of Cardiac Arrest, Crohns Disease, Depression, Bradycardia    Patient Stated Goals Resolve the Dizziness; Be able to manage the dizziness.    Currently in Pain? No/denies             [facility] Adult PT Treatment/Exercise - 04/11/21 0001      Therapeutic Activites    Therapeutic Activities Other Therapeutic Activities    Other Therapeutic Activities Completed seated on physioball completed bouncing with EO 2 x 30 seconds; then progressed to EC 2 x 30 seconds.      Neuro Re-ed    Neuro Re-ed Details  Standing completed diagonal reaching to floor <> counter for cone and placing to promtoe habituation completed 2 cones x 6 reps. Dizziness 4/10 after completion. Completed ambulation with self ball toss 2 x 50' with visual tracking. mild dizziness with completion.  Vestibular Treatment/Exercise - 04/11/21 0001      Vestibular Treatment/Exercise   Habituation Exercises Seated Horizontal Head Turns;Seated Vertical Head Turns;Standing Horizontal Head Turns      Seated Horizontal Head Turns   Number of Reps  10   x 2 sets   Symptom Description  Seated on physioball while performing bouncing with EO, then progressed to EC. mild increase in dizziness with EC, close supervision.      Seated Vertical Head Turns   Number of Reps  10   x 2 sets   Symptom Description  Seated on physioball  while performing bouncing with EO, then progressed to EC. mild dizziness, but increased compared to horizontal. supervision with EC.      Standing Horizontal Head Turns   Symptom Description  Completed standing horizontal head turns to R/L behind patient PT holding up card, patient having to locate card and read outloud completed x 10 reps. then progressed to completing with ambulation, x 4 laps (50' each direction) with pure horizontal head movement. then with 4 x 50' with PT holding card in diagonal postion to promtoe visual searching. Increased dizziness, staying around 3-4/10              PT Short Term Goals - 04/01/21 1628      PT SHORT TERM GOAL #1   Title = LTG             PT Long Term Goals - 03/21/21 1902      PT LONG TERM GOAL #1   Title Patient will be independent with final vestibular/balance HEP (ALL LTGs Due: 05/07/21)    Baseline will continue to benefit from progressive HEP    Time 6    Period Weeks    Status On-going    Target Date 05/07/21      PT LONG TERM GOAL #2   Title Pt will improve DFS >/= 55 on FOTO    Baseline DPS: 45; DFS: 52.3; 03/21/21: DPS: 50; DFS: 52.3    Time 6    Period Weeks    Status Revised      PT LONG TERM GOAL #3   Title Patient will report </= 1 on all components of MSQ to demonstrate reduced motion sensitivity    Baseline see flowsheet    Time 6    Period Weeks    Status Revised      PT LONG TERM GOAL #4   Title Patient report ability to return to dance activities and maintain dizziness </= 5/10 for improved activity tolerance    Baseline unable to complete w/o significant dizziness    Time 6    Period Weeks    Status Revised                 Plan - 04/11/21 1613    Clinical Impression Statement Did not progress HEP as patient has still not been able to complete as frequently due to personal concerns. Continued session focused on habituation to head turns, and activities on physioball to further challenge vertical  displacements. Mild symptoms throughout therapy session. Will continue to progress toward all LTGs.    Personal Factors and Comorbidities Comorbidity 3+;Time since onset of injury/illness/exacerbation    Comorbidities Patient is a 35 y.o. female referred to Neuro OPPT for Vertigo. Patient's PMH significant for the following: Migraine, Vertigo, Orthostatics, AICD, Hx of Cardiac Arrest, Crohns Disease, Depression, Bradycardia. Upon evaluation patient presents with the following impairments: Dizziness,   Patient will benefit from skilled  PT services to address the following impairments    Examination-Activity Limitations Bend;Bed Mobility;Stand    Examination-Participation Restrictions Cleaning;Community Activity    Stability/Clinical Decision Making Stable/Uncomplicated    Rehab Potential Fair    PT Frequency 1x / week    PT Duration 6 weeks    PT Treatment/Interventions ADLs/Self Care Home Management;Aquatic Therapy;Canalith Repostioning;Cryotherapy;Electrical Stimulation;Moist Heat;Gait training;Stair training;Functional mobility training;Therapeutic activities;Therapeutic exercise;Balance training;Neuromuscular re-education;Patient/family education;Dry needling;Passive range of motion;Vestibular;Manual techniques    PT Next Visit Plan Check tolerance to HEP.  Continue to progress VOR x 1. Habituation to Vertical head turns, sidelying to sit, and reaching/bending. Bouncing on physioball performing head movements with EO/EC, visual tracking, etc. Continue to work on balance on compliant surfaces to work visual and vestibular systems more.    PT [place]: [id number]    Consulted and Agree with Plan of Care Patient           Patient will benefit from skilled therapeutic intervention in order to improve the following deficits and impairments:  Dizziness,Decreased activity tolerance,Decreased balance  Visit Diagnosis: Dizziness and giddiness  Unsteadiness on feet     Problem  List Patient Active Problem List   Diagnosis Date Noted  . 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 Dual implantable cardioverter-defibrillator in situ 09/22/2018  . Crohn's disease of both small and large intestine without complication ([place]) 03/49/1791  . Status post radiofrequency ablation for arrhythmia, atrial tach 06/08/15 06/08/2015  . SVT (supraventricular tachycardia) ([place]) 04/16/2015  . Defibrillator discharge   . VT (ventricular tachycardia) ([place])   . Chronic systolic heart failure ([place]) 09/14/2014  . Ventricular fibrillation ([place]) 09/11/2014  . Sinus bradycardia 09/11/2014  . Cardiomyopathy ([place]) 09/11/2014  . Acute renal failure ([place]) 09/08/2014  . Crohn's duodenitis ([place]) 09/08/2014  . Prolonged QT interval 09/08/2014  . Anemia 09/08/2014  . 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 Cardiac arrest ([place]) 08/31/2014  . Hypokalemia 08/31/2014  . Acute respiratory failure with hypoxia ([place]) 08/31/2014  . Pancytopenia ([place]) 11/23/2012    Jones Bales, PT, DPT 04/11/2021, 4:17 PM  [facility] [address] [place], [place], [address] Phone: [phone]   Fax:  [phone]  Name: MADOLINE BHATT MRN: [id number] Date of Birth: 1986-08-07

## 2021-04-16 ENCOUNTER — Other Ambulatory Visit: Payer: Self-pay

## 2021-04-16 ENCOUNTER — Ambulatory Visit: Payer: 59

## 2021-04-16 DIAGNOSIS — R2681 Unsteadiness on feet: Secondary | ICD-10-CM

## 2021-04-16 DIAGNOSIS — R42 Dizziness and giddiness: Secondary | ICD-10-CM

## 2021-04-16 NOTE — Therapy (Signed)
[facility] [address] [place], [place], [address] Phone: [phone]   Fax:  [phone]  Physical Therapy Treatment  Patient Details  Name: Julie Saunders MRN: [id number] Date of Birth: 24-Apr-1986 Referring Provider (PT): Ward Givens, NP   Encounter Date: 04/16/2021   PT End of Session - 04/16/21 1532    Visit Number 11    Number of Visits 14    Date for PT Re-Evaluation 05/07/21   pushed out due to delay in scheduling   Authorization Type UHC (VL: 90)    PT Start Time 1531    PT Stop Time 1612    PT Time Calculation (min) 41 min    Activity Tolerance Patient tolerated treatment well    Behavior During Therapy [facility] for tasks assessed/performed           Past Medical History:  Diagnosis Date  . 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 Allergy   . Bradycardia   . Cardiac arrest ([place]) 08/31/2014  . Crohn disease ([place])   . Depression   . History of blood transfusion 09/2012   "had allergic rxn to one of my Crohn's RX"   . Migraine    "a few times/yr" (06/08/2015)  . Pancytopenia ([place]) 11/23/2012  . Status post radiofrequency ablation for arrhythmia, atrial tach 06/08/15 06/08/2015  . SVT (supraventricular tachycardia) ([place]) 12/08/2014   likely an atrial tachycardia with CL 250 msec    Past Surgical History:  Procedure Laterality Date  . ABLATION    . ELECTROPHYSIOLOGIC STUDY N/A 06/08/2015   Procedure: SVT Ablation;  Surgeon: Thompson Grayer, MD;  Location: [facility];  Service: Cardiovascular;  Laterality: N/A;  . ELECTROPHYSIOLOGY STUDY N/A 09/11/2014   Procedure: ELECTROPHYSIOLOGY STUDY;  Surgeon: Deboraha Sprang, MD;  Location: [facility] CATH LAB;  Service: Cardiovascular;  Laterality: N/A;  . IMPLANTABLE CARDIOVERTER DEFIBRILLATOR IMPLANT N/A 09/13/2014   Procedure: IMPLANTABLE CARDIOVERTER DEFIBRILLATOR  IMPLANT;  Surgeon: Deboraha Sprang, MD;  Location: [facility] CATH LAB;  Service: Cardiovascular;  Laterality: N/A;; STJ dual chamber ICD implanted by Dr Caryl Comes for aborted cardiac arrest    There were no vitals filed for this visit.   Subjective Assessment - 04/16/21 1534    Subjective Patient reports had some days with the lightheadedness sensation, overall staying mild.    Pertinent History Migraine, Vertigo, Orthostatics, AICD, Hx of Cardiac Arrest, Crohns Disease, Depression, Bradycardia    Patient Stated Goals Resolve the Dizziness; Be able to manage the dizziness.    Currently in Pain? No/denies              [facility] Adult PT Treatment/Exercise - 04/16/21 0001      Transfers   Transfers Sit to Stand;Stand to Sit    Number of Reps 10 reps;2 sets    Comments completed x 10 reps with BLE on airex and EO, mild dizziness. progresed to completing with EC x 10 reps. 3/10 dizziness after EC, reports some lightheadedness as well.           Vestibular Treatment/Exercise - 04/16/21 0001      Vestibular Treatment/Exercise   Vestibular Treatment Provided Gaze;Habituation    Habituation Exercises Seated Vertical Head Turns;Seated Horizontal Head Turns    Gaze Exercises X1 Viewing Horizontal;X1 Viewing Vertical;Comment      Seated Horizontal Head Turns   Number of Reps  2    Symptom Description  Seated  on physioball while performing bouncing with EC head static, then progressed to [facility] with head turns x 30 secs, then progressed second rep to 60 seconds. mild increase in dizziness with EC, 3/10 reported. Resolves quickly.      Seated Vertical Head Turns   Number of Reps  2    Symptom Description  Seated on physioball while performing bouncing with EC head static, then progressed to [facility] with head nods x 30 secs, moderate dizziness require seated rest break. progressed second rep to 60 seconds, 4-5/10 dizziness reported. overall more symptomatic with vertical > horizontal.      X1 Viewing Horizontal    Foot Position standing feet apart firm > feet apart foam, busy background    Reps 2    Comments 60 seconds; mild symptoms. mild postural sway      X1 Viewing Vertical   Foot Position standing feet apart firm > feet apart foam, busy background    Reps 2    Comments 60 seconds; tolerated increase in time well. 3/10 dizziness overall throughout.      Eye/Head Exercise Vertical   Comment Completed VOR x 1 with ambulation: completed horizontal VOR x 1 4 x 40' progressing speed of head turns, then completed vertical VOR x 1, 4 x 40". Close supervision. Mild dizziness staying around 3-4/10 with completion.                 PT Education - 04/16/21 1613    Education Details VOR x 1 on patterned background    Person(s) Educated Patient    Methods Explanation;Demonstration    Comprehension Verbalized understanding            PT Short Term Goals - 04/01/21 1628      PT SHORT TERM GOAL #1   Title = LTG             PT Long Term Goals - 03/21/21 1902      PT LONG TERM GOAL #1   Title Patient will be independent with final vestibular/balance HEP (ALL LTGs Due: 05/07/21)    Baseline will continue to benefit from progressive HEP    Time 6    Period Weeks    Status On-going    Target Date 05/07/21      PT LONG TERM GOAL #2   Title Pt will improve DFS >/= 55 on FOTO    Baseline DPS: 45; DFS: 52.3; 03/21/21: DPS: 50; DFS: 52.3    Time 6    Period Weeks    Status Revised      PT LONG TERM GOAL #3   Title Patient will report </= 1 on all components of MSQ to demonstrate reduced motion sensitivity    Baseline see flowsheet    Time 6    Period Weeks    Status Revised      PT LONG TERM GOAL #4   Title Patient report ability to return to dance activities and maintain dizziness </= 5/10 for improved activity tolerance    Baseline unable to complete w/o significant dizziness    Time 6    Period Weeks    Status Revised                 Plan - 04/16/21 1614    Clinical  Impression Statement Continued progression of VOR x 1 standing with patterned background as well as initiated with ambulation. Patient has not trialed VOR on patterned background at home, so educating on completing this for progression.  Rest of session focused on continued vertical displacement and otolith inputs with eyes closed. Will continue to progress toward all LTGs.    Personal Factors and Comorbidities Comorbidity 3+;Time since onset of injury/illness/exacerbation    Comorbidities Patient is a 35 y.o. female referred to Neuro OPPT for Vertigo. Patient's PMH significant for the following: Migraine, Vertigo, Orthostatics, AICD, Hx of Cardiac Arrest, Crohns Disease, Depression, Bradycardia. Upon evaluation patient presents with the following impairments: Dizziness,   Patient will benefit from skilled PT services to address the following impairments    Examination-Activity Limitations Bend;Bed Mobility;Stand    Examination-Participation Restrictions Cleaning;Community Activity    Stability/Clinical Decision Making Stable/Uncomplicated    Rehab Potential Fair    PT Frequency 1x / week    PT Duration 6 weeks    PT Treatment/Interventions ADLs/Self Care Home Management;Aquatic Therapy;Canalith Repostioning;Cryotherapy;Electrical Stimulation;Moist Heat;Gait training;Stair training;Functional mobility training;Therapeutic activities;Therapeutic exercise;Balance training;Neuromuscular re-education;Patient/family education;Dry needling;Passive range of motion;Vestibular;Manual techniques    PT Next Visit Plan Continue to progress VOR x 1. Habituation to Vertical head turns, sidelying to sit, and reaching/bending. Bouncing on physioball performing head movements with EO/EC, visual tracking, etc. Continue to work on balance on compliant surfaces to work visual and vestibular systems more.    PT [place]: [id number]    Consulted and Agree with Plan of Care Patient           Patient will  benefit from skilled therapeutic intervention in order to improve the following deficits and impairments:  Dizziness,Decreased activity tolerance,Decreased balance  Visit Diagnosis: Dizziness and giddiness  Unsteadiness on feet     Problem List Patient Active Problem List   Diagnosis Date Noted  . 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 Dual implantable cardioverter-defibrillator in situ 09/22/2018  . Crohn's disease of both small and large intestine without complication ([place]) 14/97/0263  . Status post radiofrequency ablation for arrhythmia, atrial tach 06/08/15 06/08/2015  . SVT (supraventricular tachycardia) ([place]) 04/16/2015  . Defibrillator discharge   . VT (ventricular tachycardia) ([place])   . Chronic systolic heart failure ([place]) 09/14/2014  . Ventricular fibrillation ([place]) 09/11/2014  . Sinus bradycardia 09/11/2014  . Cardiomyopathy ([place]) 09/11/2014  . Acute renal failure ([place]) 09/08/2014  . Crohn's duodenitis ([place]) 09/08/2014  . Prolonged QT interval 09/08/2014  . Anemia 09/08/2014  . 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 Cardiac arrest ([place]) 08/31/2014  . Hypokalemia 08/31/2014  . Acute respiratory failure with hypoxia ([place]) 08/31/2014  . Pancytopenia ([place]) 11/23/2012    Jones Bales, PT, DPT 04/16/2021, 4:16 PM  [facility] [address] [place], [place], [address] Phone: [phone]   Fax:  [phone]  Name: Julie Saunders MRN: [id number] Date of Birth: 1986-02-07

## 2021-04-23 ENCOUNTER — Ambulatory Visit: Payer: 59

## 2021-04-23 ENCOUNTER — Other Ambulatory Visit: Payer: Self-pay

## 2021-04-23 DIAGNOSIS — R42 Dizziness and giddiness: Secondary | ICD-10-CM | POA: Diagnosis not present

## 2021-04-23 DIAGNOSIS — R2681 Unsteadiness on feet: Secondary | ICD-10-CM

## 2021-04-23 NOTE — Therapy (Signed)
[facility] [address] [place], [place], [address] Phone: [phone]   Fax:  [phone]  Physical Therapy Treatment  Patient Details  Name: Julie Saunders MRN: [id number] Date of Birth: [birth year] Referring Provider (PT): Ward Givens, NP   Encounter Date: 04/23/2021   PT End of Session - 04/23/21 1622     Visit Number 12    Number of Visits 14    Date for PT Re-Evaluation 05/07/21   pushed out due to delay in scheduling   Authorization Type UHC (VL: 90)    PT Start Time 2620    PT Stop Time 1658    PT Time Calculation (min) 41 min    Activity Tolerance Patient tolerated treatment well    Behavior During Therapy [facility] for tasks assessed/performed             Past Medical History:  Diagnosis Date   AICD (automatic cardioverter/defibrillator) present 09/14/2015   STJ dual chamber ICD implanted by Dr Caryl Comes for aborted cardiac arrest   Allergy    Bradycardia    Cardiac arrest ([place]) 08/31/2014   Crohn disease ([place])    Depression    History of blood transfusion 09/2012   "had allergic rxn to one of my Crohn's RX"    Migraine    "a few times/yr" (06/08/2015)   Pancytopenia ([place]) 11/23/2012   Status post radiofrequency ablation for arrhythmia, atrial tach 06/08/15 06/08/2015   SVT (supraventricular tachycardia) ([place]) 12/08/2014   likely an atrial tachycardia with CL 250 msec    Past Surgical History:  Procedure Laterality Date   ABLATION     ELECTROPHYSIOLOGIC STUDY N/A 06/08/2015   Procedure: SVT Ablation;  Surgeon: Thompson Grayer, MD;  Location: [facility];  Service: Cardiovascular;  Laterality: N/A;   ELECTROPHYSIOLOGY STUDY N/A 09/11/2014   Procedure: ELECTROPHYSIOLOGY STUDY;  Surgeon: Deboraha Sprang, MD;  Location: [facility] CATH LAB;  Service: Cardiovascular;  Laterality: N/A;   IMPLANTABLE CARDIOVERTER DEFIBRILLATOR IMPLANT N/A 09/13/2014   Procedure: IMPLANTABLE CARDIOVERTER DEFIBRILLATOR IMPLANT;   Surgeon: Deboraha Sprang, MD;  Location: [facility] CATH LAB;  Service: Cardiovascular;  Laterality: N/A;; STJ dual chamber ICD implanted by Dr Caryl Comes for aborted cardiac arrest    There were no vitals filed for this visit.   Subjective Assessment - 04/23/21 1620     Subjective Patient reports that she has a HA that has lingering since the last time she was here. Patient reports she did have some dizziness episodes where she thought that hte bright light from outdoors increased dizziness. Completing VOR with the mirror as background made her nauseous.    Pertinent History Migraine, Vertigo, Orthostatics, AICD, Hx of Cardiac Arrest, Crohns Disease, Depression, Bradycardia    Patient Stated Goals Resolve the Dizziness; Be able to manage the dizziness.    Currently in Pain? No/denies                               [facility] Adult PT Treatment/Exercise - 04/23/21 0001       Therapeutic Activites    Therapeutic Activities Other Therapeutic Activities    Other Therapeutic Activities Completed VOR cancellation for visual motion sensitvity standing on foam feet apart x 10 reps, mild dizziness. Standing on airex with feet apart completed visual tracking with CW/CCW circles 2 x 10 reps to bilateral direction. Increased dizziness with CCW > CW noted. intermittent rest breaks, quick return to baseline.  Exercises   Exercises Knee/Hip      Knee/Hip Exercises: Aerobic   Elliptical Completed elliptical 1.5 mph x 4 minutes to work on improved cardiovascular endurance and further evaluate dizziness with activities to allow for return to complete of activities. Patient HR staying within 75-80 with activities; mild dizziness with completion 3/10. PT educating on monitoring dizziness along iwth HR with return to gym activities. Limited by fatigue.             Vestibular Treatment/Exercise - 04/23/21 0001       Vestibular Treatment/Exercise   Vestibular Treatment Provided Gaze;Habituation     Gaze Exercises X1 Viewing Horizontal;X1 Viewing Vertical;Comment      Standing Horizontal Head Turns   Symptom Description  Completed horizontal head turns to R/L with ambulation, PT holding up card behind patient having to locate card and read outloud completed 1 x 115'. Then 1 x 115' with PT holding card in diagonal postion to promote visual searching. Increased dizziness with increased speed of head movements.      X1 Viewing Horizontal   Foot Position feet apart firm (busy background - patterned vs mirror)    Reps 2    Comments 60 seconds on patterned background; mild dizziness. significant increase in nauseous with completion at mirror      X1 Viewing Vertical   Foot Position feet apart firm (busy background - patterned vs mirror)    Reps 2    Comments 60 seconds on patterned background; mild dizziness. with mirror background 20 seconds, increase in nauseous                     PT Short Term Goals - 04/01/21 1628       PT SHORT TERM GOAL #1   Title = LTG               PT Long Term Goals - 03/21/21 1902       PT LONG TERM GOAL #1   Title Patient will be independent with final vestibular/balance HEP (ALL LTGs Due: 05/07/21)    Baseline will continue to benefit from progressive HEP    Time 6    Period Weeks    Status On-going    Target Date 05/07/21      PT LONG TERM GOAL #2   Title Pt will improve DFS >/= 55 on FOTO    Baseline DPS: 45; DFS: 52.3; 03/21/21: DPS: 50; DFS: 52.3    Time 6    Period Weeks    Status Revised      PT LONG TERM GOAL #3   Title Patient will report </= 1 on all components of MSQ to demonstrate reduced motion sensitivity    Baseline see flowsheet    Time 6    Period Weeks    Status Revised      PT LONG TERM GOAL #4   Title Patient report ability to return to dance activities and maintain dizziness </= 5/10 for improved activity tolerance    Baseline unable to complete w/o significant dizziness    Time 6    Period Weeks     Status Revised                   Plan - 04/23/21 1710     Clinical Impression Statement Reviewed VOR x 1 with various background (patterned vs mirror). Patient having increased nausea with mirror, PT educating on trialing other patterned surfaces at home to allow for improved tolerance  and reduced symptoms. Continued rest of session focused on habituation to head turns and visual tracking for motion sensitivity. Will continue to progress toward all LTGs.    Personal Factors and Comorbidities Comorbidity 3+;Time since onset of injury/illness/exacerbation    Comorbidities Patient is a 35 y.o. female referred to Neuro OPPT for Vertigo. Patient's PMH significant for the following: Migraine, Vertigo, Orthostatics, AICD, Hx of Cardiac Arrest, Crohns Disease, Depression, Bradycardia. Upon evaluation patient presents with the following impairments: Dizziness,   Patient will benefit from skilled PT services to address the following impairments    Examination-Activity Limitations Bend;Bed Mobility;Stand    Examination-Participation Restrictions Cleaning;Community Activity    Stability/Clinical Decision Making Stable/Uncomplicated    Rehab Potential Fair    PT Frequency 1x / week    PT Duration 6 weeks    PT Treatment/Interventions ADLs/Self Care Home Management;Aquatic Therapy;Canalith Repostioning;Cryotherapy;Electrical Stimulation;Moist Heat;Gait training;Stair training;Functional mobility training;Therapeutic activities;Therapeutic exercise;Balance training;Neuromuscular re-education;Patient/family education;Dry needling;Passive range of motion;Vestibular;Manual techniques    PT Next Visit Plan Continue to progress VOR x 1. Habituation to Vertical head turns, sidelying to sit, and reaching/bending. Bouncing on physioball performing head movements with EO/EC, visual tracking, etc. Continue to work on balance on compliant surfaces to work visual and vestibular systems more.    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   Dual implantable cardioverter-defibrillator in situ 09/22/2018   Crohn's disease of both small and large intestine without complication ([place]) 80/22/3361   Status post radiofrequency ablation for arrhythmia, atrial tach 06/08/15 06/08/2015   SVT (supraventricular tachycardia) ([facility]) 04/16/2015   Defibrillator discharge    VT (ventricular tachycardia) ([facility])    Chronic systolic heart failure ([place]) 09/14/2014   Ventricular fibrillation ([place]) 09/11/2014   Sinus bradycardia 09/11/2014   Cardiomyopathy ([place]) 09/11/2014   Acute renal failure ([place]) 09/08/2014   Crohn's duodenitis ([place]) 09/08/2014   Prolonged QT interval 09/08/2014   Anemia 09/08/2014   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   Cardiac arrest ([place]) 08/31/2014   Hypokalemia 08/31/2014   Acute respiratory failure with hypoxia ([place]) 08/31/2014   Pancytopenia ([place]) 11/23/2012    Jones Bales, PT, DPT 04/23/2021, 5:12 PM  [facility] [address] [place], [place], [address] Phone: [phone]   Fax:  [phone]  Name: Julie Saunders MRN: [id number] Date of Birth: 1986-02-08

## 2021-04-30 ENCOUNTER — Ambulatory Visit: Payer: 59

## 2021-04-30 ENCOUNTER — Other Ambulatory Visit: Payer: Self-pay

## 2021-04-30 DIAGNOSIS — R42 Dizziness and giddiness: Secondary | ICD-10-CM | POA: Diagnosis not present

## 2021-04-30 DIAGNOSIS — R2681 Unsteadiness on feet: Secondary | ICD-10-CM

## 2021-04-30 NOTE — Therapy (Signed)
[facility] [address] [place], [place], [address] Phone: [phone]   Fax:  [phone]  Physical Therapy Treatment  Patient Details  Name: Julie Saunders MRN: [id number] Date of Birth: 03/24/1986 Referring Provider (PT): Ward Givens, NP   Encounter Date: 04/30/2021   PT End of Session - 04/30/21 1705     Visit Number 13    Number of Visits 14    Date for PT Re-Evaluation 05/07/21   pushed out due to delay in scheduling   Authorization Type UHC (VL: 90)    PT Start Time 1706   patient arriving late   PT Stop Time 1745    PT Time Calculation (min) 39 min    Activity Tolerance Patient tolerated treatment well    Behavior During Therapy [facility] for tasks assessed/performed             Past Medical History:  Diagnosis Date   AICD (automatic cardioverter/defibrillator) present 09/14/2015   STJ dual chamber ICD implanted by Dr Caryl Comes for aborted cardiac arrest   Allergy    Bradycardia    Cardiac arrest ([place]) 08/31/2014   Crohn disease ([place])    Depression    History of blood transfusion 09/2012   "had allergic rxn to one of my Crohn's RX"    Migraine    "a few times/yr" (06/08/2015)   Pancytopenia ([place]) 11/23/2012   Status post radiofrequency ablation for arrhythmia, atrial tach 06/08/15 06/08/2015   SVT (supraventricular tachycardia) ([place]) 12/08/2014   likely an atrial tachycardia with CL 250 msec    Past Surgical History:  Procedure Laterality Date   ABLATION     ELECTROPHYSIOLOGIC STUDY N/A 06/08/2015   Procedure: SVT Ablation;  Surgeon: Thompson Grayer, MD;  Location: [place] CV LAB;  Service: Cardiovascular;  Laterality: N/A;   ELECTROPHYSIOLOGY STUDY N/A 09/11/2014   Procedure: ELECTROPHYSIOLOGY STUDY;  Surgeon: Deboraha Sprang, MD;  [facility] CATH LAB;  Service: Cardiovascular;  Laterality: N/A;   IMPLANTABLE CARDIOVERTER DEFIBRILLATOR IMPLANT N/A 09/13/2014   Procedure: IMPLANTABLE CARDIOVERTER  DEFIBRILLATOR IMPLANT;  Surgeon: Deboraha Sprang, MD;  Location: [facility] CATH LAB;  Service: Cardiovascular;  Laterality: N/A;; STJ dual chamber ICD implanted by Dr Caryl Comes for aborted cardiac arrest    There were no vitals filed for this visit.   Subjective Assessment - 04/30/21 1707     Subjective Patient reports that she has been having a HA, and reports it comes/goes. Reports that the she stopped the mirror and has been using a brick pattern background. Does have some dizziness at baseline 3/10. No other new changes/complaints.    Pertinent History Migraine, Vertigo, Orthostatics, AICD, Hx of Cardiac Arrest, Crohns Disease, Depression, Bradycardia    Patient Stated Goals Resolve the Dizziness; Be able to manage the dizziness.    Currently in Pain? Yes    Pain Score 3     Pain Location Head    Pain Orientation --   generalized   Pain Descriptors / Indicators Headache    Pain Type Acute pain                 [facility] Adult PT Treatment/Exercise - 04/30/21 0001       Therapeutic Activites    Therapeutic Activities Other Therapeutic Activities    Other Therapeutic Activities Completed VOR cancellation for visual motion sensitvity standing on foam feet apart 2 x 10 reps, mild dizziness, 3/10. Progressed to completing VOR cancellation with ambulation, 4 x 30', mild increase in dizziness  4/10 with completion. Seated completed bending reaching down to cone with compensatory saccade to focal point with completion x 5 reps, mild dizziness. Progressed to standing, completed x 5 reps, mild dizziness. PT educating on proper use of knee/hip flexion and hip hinge to reduce motion sensitivty and promtoe improved technique with bending activities                 Balance Exercises - 04/30/21 0001       Balance Exercises: Standing   Tandem Stance Eyes open;2 reps;Limitations    Tandem Stance Time completed eyes open tandems tance with horizontal head turns x 10 reps each direction    Marching  Foam/compliant surface;Static;Head turns;Limitations    Marching Limitations standing on blue mat completd alternating marching with EO x 30 seconds, then progresed to EC 2 x 30 seconds. With EC completed horizontal head turns x 10 reps, mild dizziness. Require standing rest break for symptoms to return to baseline. Then completed vertical head nods x 10 reps, with mild imbalance noted. Increased challenge with vertical > horizontal.            Updated HEP, new additions bolded:  Access Code: [email or web address] URL: [email or web address] Date: 04/30/2021 Prepared by: Baldomero Lamy   Exercises Brandt-Daroff Vestibular Exercise - 1 x daily - 5 x weekly - 1 sets - 5 reps Seated Nose to Right Knee Vestibular Habituation - 2 x daily - 5 x weekly - 2 sets - 5 reps Seated Nose to Left Knee Vestibular Habituation - 2 x daily - 5 x weekly - 2 sets - 5 reps Romberg Stance Eyes Closed on Foam Pad - 1 x daily - 5 x weekly - 1 sets - 3 reps - 10-30 hold Romberg Stance with Head Nods on Foam Pad - 1 x daily - 7 x weekly - 2 sets - 10 reps Romberg Stance on Foam Pad with Head Rotation - 1 x daily - 7 x weekly - 3 sets - 10 reps Tandem Stance with Head Rotation - 1 x daily - 7 x weekly - 2 sets - 10 reps   PT Education - 04/30/21 1741     Education Details Tandem Stance with Head Rotation    Person(s) Educated Patient    Methods Explanation;Demonstration;Handout    Comprehension Verbalized understanding;Returned demonstration              PT Short Term Goals - 04/01/21 1628       PT SHORT TERM GOAL #1   Title = LTG               PT Long Term Goals - 03/21/21 1902       PT LONG TERM GOAL #1   Title Patient will be independent with final vestibular/balance HEP (ALL LTGs Due: 05/07/21)    Baseline will continue to benefit from progressive HEP    Time 6    Period Weeks    Status On-going    Target Date 05/07/21      PT LONG TERM GOAL #2   Title Pt will improve DFS >/= 55  on FOTO    Baseline DPS: 45; DFS: 52.3; 03/21/21: DPS: 50; DFS: 52.3    Time 6    Period Weeks    Status Revised      PT LONG TERM GOAL #3   Title Patient will report </= 1 on all components of MSQ to demonstrate reduced motion sensitivity    Baseline see flowsheet    Time 6  Period Weeks    Status Revised      PT LONG TERM GOAL #4   Title Patient report ability to return to dance activities and maintain dizziness </= 5/10 for improved activity tolerance    Baseline unable to complete w/o significant dizziness    Time 6    Period Weeks    Status Revised                   Plan - 04/30/21 1934     Clinical Impression Statement Continued activities focused on habituation and reducing visual motion sensitivity on complaint surfaces and with ambulation. Increased challenge noted with marching with eyes closed on complaint surfaces. Added tandem stance with head turns to further promote balance to HEP. Planned to d/c at next visit, and follow up with ENT at this time.    Personal Factors and Comorbidities Comorbidity 3+;Time since onset of injury/illness/exacerbation    Comorbidities Patient is a 35 y.o. female referred to Neuro OPPT for Vertigo. Patient's PMH significant for the following: Migraine, Vertigo, Orthostatics, AICD, Hx of Cardiac Arrest, Crohns Disease, Depression, Bradycardia. Upon evaluation patient presents with the following impairments: Dizziness,   Patient will benefit from skilled PT services to address the following impairments    Examination-Activity Limitations Bend;Bed Mobility;Stand    Examination-Participation Restrictions Cleaning;Community Activity    Stability/Clinical Decision Making Stable/Uncomplicated    Rehab Potential Fair    PT Frequency 1x / week    PT Duration 6 weeks    PT Treatment/Interventions ADLs/Self Care Home Management;Aquatic Therapy;Canalith Repostioning;Cryotherapy;Electrical Stimulation;Moist Heat;Gait training;Stair  training;Functional mobility training;Therapeutic activities;Therapeutic exercise;Balance training;Neuromuscular re-education;Patient/family education;Dry needling;Passive range of motion;Vestibular;Manual techniques    PT Next Visit Plan Check LTGs + D/C    PT Home Exercise Plan Medbridge: [email or web address]    Consulted and Agree with Plan of Care Patient             Patient will benefit from skilled therapeutic intervention in order to improve the following deficits and impairments:  Dizziness, Decreased activity tolerance, Decreased balance  Visit Diagnosis: Dizziness and giddiness  Unsteadiness on feet     Problem List Patient Active Problem List   Diagnosis Date Noted   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   Dual implantable cardioverter-defibrillator in situ 09/22/2018   Crohn's disease of both small and large intestine without complication ([place]) 67/20/9470   Status post radiofrequency ablation for arrhythmia, atrial tach 06/08/15 06/08/2015   SVT (supraventricular tachycardia) ([facility]) 04/16/2015   Defibrillator discharge    VT (ventricular tachycardia) (H[facility])    Chronic systolic heart failure ([place]) 09/14/2014   Ventricular fibrillation ([place]) 09/11/2014   Sinus bradycardia 09/11/2014   Cardiomyopathy ([place]) 09/11/2014   Acute renal failure ([place]) 09/08/2014   Crohn's duodenitis ([place]) 09/08/2014   Prolonged QT interval 09/08/2014   Anemia 09/08/2014   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 [place], [place], [address] Phone: [phone]   Fax:  [phone]  Name: Julie Saunders MRN: [id number] Date of Birth: 03/03/1986

## 2021-04-30 NOTE — Patient Instructions (Signed)
Access Code: [email or web address] URL: [email or web address] Date: 04/30/2021 Prepared by: Baldomero Lamy  Exercises Brandt-Daroff Vestibular Exercise - 1 x daily - 5 x weekly - 1 sets - 5 reps Seated Nose to Right Knee Vestibular Habituation - 2 x daily - 5 x weekly - 2 sets - 5 reps Seated Nose to Left Knee Vestibular Habituation - 2 x daily - 5 x weekly - 2 sets - 5 reps Romberg Stance Eyes Closed on Foam Pad - 1 x daily - 5 x weekly - 1 sets - 3 reps - 10-30 hold Romberg Stance with Head Nods on Foam Pad - 1 x daily - 7 x weekly - 2 sets - 10 reps Romberg Stance on Foam Pad with Head Rotation - 1 x daily - 7 x weekly - 3 sets - 10 reps Tandem Stance with Head Rotation - 1 x daily - 7 x weekly - 2 sets - 10 reps

## 2021-05-03 ENCOUNTER — Telehealth (HOSPITAL_COMMUNITY): Payer: Self-pay | Admitting: *Deleted

## 2021-05-03 NOTE — Telephone Encounter (Signed)
Contacted patient to follow-up as expected on her new gym membership and progress. Patient did not answer and no voicemail available. Will continue to try to reach patient.     Landis Martins, MS, ACSM, [facility] Clinical Exercise Physiologist/ Health and Wellness Coach

## 2021-05-07 ENCOUNTER — Ambulatory Visit: Payer: 59

## 2021-05-10 ENCOUNTER — Telehealth (HOSPITAL_COMMUNITY): Payer: Self-pay | Admitting: *Deleted

## 2021-05-10 NOTE — Telephone Encounter (Signed)
Contacted patient per planned follow-up. She is feeling tired this week and has not felt like walking in the heat to her gym. She has not yet completed an exercise since joining [employer] due to various reasons, pertaining to her overall physical state. She is still working through vertigo physical therapy and has stated this is challenging for her with little improvement. We discussed barriers that she can control and have found successful and how she can implement this into her goals to get moving and exercising. She plans to try and get her exercise in, first thing in the morning on Tuesday, July 5th- to get her day going. She acknowledges that she has halted her action and needs to step up and make strides. We agreed to have a contact point Next Wednesday, July 6th via Email with a report of her exercise and success at [employer] on July 5th. CEP will call with need for further details and clarification and to update any changes if needed.    Landis Martins, MS, ACSM, [facility] Clinical Exercise Physiologist/ Health and Wellness Coach

## 2021-05-21 ENCOUNTER — Telehealth: Payer: Self-pay

## 2021-05-21 ENCOUNTER — Other Ambulatory Visit: Payer: Self-pay

## 2021-05-21 ENCOUNTER — Ambulatory Visit: Payer: 59 | Attending: Adult Health

## 2021-05-21 DIAGNOSIS — R42 Dizziness and giddiness: Secondary | ICD-10-CM

## 2021-05-21 DIAGNOSIS — R2681 Unsteadiness on feet: Secondary | ICD-10-CM

## 2021-05-21 NOTE — Telephone Encounter (Signed)
Julie Givens, NP,  Julie Saunders has been being treated by PT services for Dizziness. This patient has made limited progress with PT services, and continues to experience dizziness with functional activities. This patient will benefit from further evaluation and assessment via ENT. If you agree please place appropriate referral for patient. Educated patient to also call and follow up regarding referral. Thank you, Julie Saunders, PT, [facility] [address] [place], [place]  [address] Phone:  [phone] Fax:  [phone]

## 2021-05-21 NOTE — Patient Instructions (Signed)
Access Code: [email or web address] URL: [email or web address] Date: 05/21/2021 Prepared by: Baldomero Lamy  Exercises Brandt-Daroff Vestibular Exercise - 1 x daily - 5 x weekly - 1 sets - 5 reps Seated Nose to Right Knee Vestibular Habituation - 2 x daily - 5 x weekly - 2 sets - 5 reps Seated Nose to Left Knee Vestibular Habituation - 2 x daily - 5 x weekly - 2 sets - 5 reps Romberg Stance Eyes Closed on Foam Pad - 1 x daily - 5 x weekly - 1 sets - 3 reps - 10-30 hold Romberg Stance with Head Nods on Foam Pad - 1 x daily - 7 x weekly - 2 sets - 10 reps Romberg Stance on Foam Pad with Head Rotation - 1 x daily - 7 x weekly - 3 sets - 10 reps Tandem Stance with Head Rotation - 1 x daily - 7 x weekly - 2 sets - 10 reps Tandem Walking with Head Rotations - 1 x daily - 7 x weekly - 1 sets - 2-3 reps

## 2021-05-21 NOTE — Therapy (Signed)
[facility] [address] [place], [place], [address] Phone: [phone]   Fax:  [phone]  Physical Therapy Treatment/Discharge Summary  Patient Details  Name: Julie Saunders MRN: [id number] Date of Birth: 10/24/1986 Referring Provider (PT): Ward Givens, NP   PHYSICAL THERAPY DISCHARGE SUMMARY  Visits from Start of Care: 14  Current functional level related to goals / functional outcomes: See Clinical Impression Statement   Remaining deficits: Dizziness   Education / Equipment: HEP Provided    Patient agrees to discharge. Patient goals were partially met. Patient is being discharged due to lack of progress.  Encounter Date: 05/21/2021   PT End of Session - 05/21/21 1704     Visit Number 14    Number of Visits 14    Date for PT Re-Evaluation 05/07/21   pushed out due to delay in scheduling   Authorization Type UHC (VL: 90)    PT Start Time 1702    PT Stop Time 1742    PT Time Calculation (min) 40 min    Activity Tolerance Patient tolerated treatment well    Behavior During Therapy [facility] for tasks assessed/performed             Past Medical History:  Diagnosis Date   AICD (automatic cardioverter/defibrillator) present 09/14/2015   STJ dual chamber ICD implanted by Dr Caryl Comes for aborted cardiac arrest   Allergy    Bradycardia    Cardiac arrest ([place]) 08/31/2014   Crohn disease ([place])    Depression    History of blood transfusion 09/2012   "had allergic rxn to one of my Crohn's RX"    Migraine    "a few times/yr" (06/08/2015)   Pancytopenia ([place]) 11/23/2012   Status post radiofrequency ablation for arrhythmia, atrial tach 06/08/15 06/08/2015   SVT (supraventricular tachycardia) ([place]) 12/08/2014   likely an atrial tachycardia with CL 250 msec    Past Surgical History:  Procedure Laterality Date   ABLATION     ELECTROPHYSIOLOGIC STUDY N/A 06/08/2015   Procedure: SVT Ablation;  Surgeon: Thompson Grayer,  MD;  Location: [place] CV LAB;  Service: Cardiovascular;  Laterality: N/A;   ELECTROPHYSIOLOGY STUDY N/A 09/11/2014   Procedure: ELECTROPHYSIOLOGY STUDY;  Surgeon: Deboraha Sprang, MD;  Location: [facility] CATH LAB;  Service: Cardiovascular;  Laterality: N/A;   IMPLANTABLE CARDIOVERTER DEFIBRILLATOR IMPLANT N/A 09/13/2014   Procedure: IMPLANTABLE CARDIOVERTER DEFIBRILLATOR IMPLANT;  Surgeon: Deboraha Sprang, MD;  Location: [facility] CATH LAB;  Service: Cardiovascular;  Laterality: N/A;; STJ dual chamber ICD implanted by Dr Caryl Comes for aborted cardiac arrest    There were no vitals filed for this visit.   Subjective Assessment - 05/21/21 1704     Subjective Patient reports last week she has been having some dizziness, some days worse than other. Overall still reports that it is mild sensation. Denies HA. Denies any other changes/complaints.    Pertinent History Migraine, Vertigo, Orthostatics, AICD, Hx of Cardiac Arrest, Crohns Disease, Depression, Bradycardia    Patient Stated Goals Resolve the Dizziness; Be able to manage the dizziness.    Currently in Pain? No/denies                [facility] PT Assessment - 05/21/21 0001       Assessment   Medical Diagnosis Vertigo    Referring Provider (PT) Ward Givens, NP      Observation/Other Assessments   Focus on Therapeutic Outcomes (FOTO)  DPS: 58, DFS: 52.3  Vestibular Assessment - 05/21/21 0001       Positional Sensitivities   Sit to Supine Lightheadedness    Supine to Left Side Lightheadedness    Supine to Right Side Lightheadedness    Supine to Sitting Mild dizziness    Right Hallpike No dizziness    Up from Right Hallpike Lightheadedness    Up from Left Hallpike Lightheadedness    Nose to Right Knee No dizziness    Right Knee to Sitting Lightedness    Nose to Left Knee No dizziness    Left Knee to Sitting Lightheadedness    Head Turning x 5 Mild dizziness    Head Nodding x 5 Mild dizziness    Pivot Right in  Standing Lightheadedness    Pivot Left in Standing Lightheadedness    Rolling Right Lightheadedness    Rolling Left Lightheadedness                [facility] Adult PT Treatment/Exercise - 05/21/21 0001       Transfers   Transfers Sit to Stand;Stand to Sit    Sit to Stand 7: Independent    Stand to Sit 7: Independent      Ambulation/Gait   Ambulation/Gait Yes    Ambulation/Gait Assistance 7: Independent    Assistive device None    Gait Pattern Within Functional Limits    Ambulation Surface Level;Indoor      Therapeutic Activites    Therapeutic Activities Other Therapeutic Activities    Other Therapeutic Activities Reviewed Vestibular/Balance HEP with patient, and educating on continued compliance. PT also educating on progression of activites to patient's tolerance           Completed entire review of current HEP and progressed to patient's tolerance:   Access Code: [email or web address] URL: [email or web address] Date: 05/21/2021 Prepared by: Baldomero Lamy  Exercises Brandt-Daroff Vestibular Exercise - 1 x daily - 5 x weekly - 1 sets - 5 reps Seated Nose to Right Knee Vestibular Habituation - 2 x daily - 5 x weekly - 2 sets - 5 reps Seated Nose to Left Knee Vestibular Habituation - 2 x daily - 5 x weekly - 2 sets - 5 reps Romberg Stance Eyes Closed on Foam Pad - 1 x daily - 5 x weekly - 1 sets - 3 reps - 10-30 hold Romberg Stance with Head Nods on Foam Pad - 1 x daily - 7 x weekly - 2 sets - 10 reps Romberg Stance on Foam Pad with Head Rotation - 1 x daily - 7 x weekly - 3 sets - 10 reps Tandem Stance with Head Rotation - 1 x daily - 7 x weekly - 2 sets - 10 reps Tandem Walking with Head Rotations - 1 x daily - 7 x weekly - 1 sets - 2-3 reps       Balance Exercises - 05/21/21 0001       Balance Exercises: Standing   Tandem Gait Forward;Limitations    Tandem Gait Limitations completed forward tandem gait with horizontal head turns x 2 laps.                PT Education - 05/21/21 1742     Education Details Reviewed HEP; Progress toward LTG; Follow up with ENT    Person(s) Educated Patient    Methods Explanation;Handout;Demonstration    Comprehension Returned demonstration;Verbalized understanding              PT Short Term Goals - 04/01/21 1628  PT SHORT TERM GOAL #1   Title = LTG               PT Long Term Goals - 05/21/21 1716       PT LONG TERM GOAL #1   Title Patient will be independent with final vestibular/balance HEP (ALL LTGs Due: 05/07/21)    Baseline reports indepence with final HEP    Time 6    Period Weeks    Status Achieved      PT LONG TERM GOAL #2   Title Pt will improve DFS >/= 55 on FOTO    Baseline DPS: 45; DFS: 52.3; 03/21/21: DPS: 50; DFS: 52.3; DPS: 58, DFS: 52.3    Time 6    Period Weeks    Status Not Met      PT LONG TERM GOAL #3   Title Patient will report </= 1 on all components of MSQ to demonstrate reduced motion sensitivity    Baseline still 2 with vertical/horiz head movements and supine to sit    Time 6    Period Weeks    Status Not Met      PT LONG TERM GOAL #4   Title Patient report ability to return to dance activities and maintain dizziness </= 5/10 for improved activity tolerance    Baseline unable to complete w/o significant dizziness;; reports intermittent dizziness with the day, sometimes range 5-6/10 level    Time 6    Period Weeks    Status Partially Met                 Plan - 05/21/21 1746     Clinical Impression Statement Today's skilled PT session focused on assesment of patient's progress toward LTGs. Patient able to meet LTG #1 and partially meet LTG #4. Patient continue to demo increased motipon sensitivity and dizziness with functional activities with limited progress made with PT services. PT believe patient will benefit from further evaluation due to continued symptoms with limited improvements noted with PT managment. Reviewed and  progressed HEP today during session. Patient agreeable to d/c at this time.    Personal Factors and Comorbidities Comorbidity 3+;Time since onset of injury/illness/exacerbation    Comorbidities Patient is a 35 y.o. female referred to Neuro OPPT for Vertigo. Patient's PMH significant for the following: Migraine, Vertigo, Orthostatics, AICD, Hx of Cardiac Arrest, Crohns Disease, Depression, Bradycardia. Upon evaluation patient presents with the following impairments: Dizziness,   Patient will benefit from skilled PT services to address the following impairments    Examination-Activity Limitations Bend;Bed Mobility;Stand    Examination-Participation Restrictions Cleaning;Community Activity    Stability/Clinical Decision Making Stable/Uncomplicated    Rehab Potential Fair    PT Frequency 1x / week    PT Duration 6 weeks    PT Treatment/Interventions ADLs/Self Care Home Management;Aquatic Therapy;Canalith Repostioning;Cryotherapy;Electrical Stimulation;Moist Heat;Gait training;Stair training;Functional mobility training;Therapeutic activities;Therapeutic exercise;Balance training;Neuromuscular re-education;Patient/family education;Dry needling;Passive range of motion;Vestibular;Manual techniques    PT [place]: [id number]    Consulted and Agree with Plan of Care Patient             Patient will benefit from skilled therapeutic intervention in order to improve the following deficits and impairments:  Dizziness, Decreased activity tolerance, Decreased balance  Visit Diagnosis: Dizziness and giddiness  Unsteadiness on feet     Problem List Patient Active Problem List   Diagnosis Date Noted   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   Dual implantable cardioverter-defibrillator in situ 09/22/2018   Crohn's disease  of both small and large intestine without complication ([place]) 03/83/3383   Status post radiofrequency ablation for arrhythmia, atrial tach 06/08/15 06/08/2015   SVT  (supraventricular tachycardia) ([place]) 04/16/2015   Defibrillator discharge    VT (ventricular tachycardia) (H[facility])    Chronic systolic heart failure ([place]) 09/14/2014   Ventricular fibrillation ([place]) 09/11/2014   Sinus bradycardia 09/11/2014   Cardiomyopathy ([place]) 09/11/2014   Acute renal failure ([place]) 09/08/2014   Crohn's duodenitis ([place]) 09/08/2014   Prolonged QT interval 09/08/2014   Anemia 09/08/2014   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 [place], [place], [address] Phone: [phone]   Fax:  [phone]  Name: AADVIKA KONEN MRN: [id number] Date of Birth: 1985/11/30

## 2021-05-22 ENCOUNTER — Encounter (HOSPITAL_COMMUNITY): Payer: Self-pay | Admitting: *Deleted

## 2021-05-22 NOTE — Telephone Encounter (Signed)
Referral to ENT has been placed

## 2021-05-22 NOTE — Telephone Encounter (Signed)
Thank you :)

## 2021-05-22 NOTE — Progress Notes (Signed)
Mykaela followed-up with her exercise and wellness plan to get to [employer] at least 1 day in the next week. She reported a 10 minute walk to/from the gym that was bearable, a 30+ minute mixed Cardio/Strength exercises (Recumbent bike 48mns, Rowing machine 10 mins and trial of elliptical and Arc Trainer). She stated that the elliptical and Arc trainer were fun, and bearable with her dizziness spells but that it strained her knees. She was advised to avoid those specific machines until she builds up a little more strength to support her joints for those exercises. She also reported that her dizziness was a light to moderate  intensity, which is improved from her previous exercises and ADLs. She reported that her muscles feel sore already, and she was advised to keep moving and stretch to avoid further pain and injury as she progresses through her exercise journey. She has been instructed to attempt at minimum 2 days of exercise a week.   She reflected very positively and had a demeanor of feeling accomplished and proud of her "wins" over breaking through barriers that previously made her hesitate to exercise.   Provided an e-mail response with further modifications for another follow-up via E-mail in one week, Wednesday 7/20.     KLandis Martins MS, ACSM, [facility] Clinical Exercise Physiologist/ Health and Wellness Coach

## 2021-05-30 ENCOUNTER — Encounter (HOSPITAL_COMMUNITY): Payer: Self-pay | Admitting: *Deleted

## 2021-05-30 ENCOUNTER — Ambulatory Visit (INDEPENDENT_AMBULATORY_CARE_PROVIDER_SITE_OTHER): Payer: 59

## 2021-05-30 DIAGNOSIS — I428 Other cardiomyopathies: Secondary | ICD-10-CM | POA: Diagnosis not present

## 2021-05-30 LAB — CUP PACEART REMOTE DEVICE CHECK
Battery Remaining Longevity: 32 mo
Battery Remaining Percentage: 32 %
Battery Voltage: 2.84 V
Brady Statistic AP VP Percent: 1 %
Brady Statistic AP VS Percent: 41 %
Brady Statistic AS VP Percent: 1 %
Brady Statistic AS VS Percent: 57 %
Brady Statistic RA Percent Paced: 38 %
Brady Statistic RV Percent Paced: 1 %
Date Time Interrogation Session: 20220721104255
HighPow Impedance: 70 Ohm
HighPow Impedance: 95 Ohm
Implantable Lead Implant Date: 20151104
Implantable Lead Implant Date: 20151104
Implantable Lead Location: 753859
Implantable Lead Location: 753860
Implantable Lead Model: 5076
Implantable Pulse Generator Implant Date: 20151104
Lead Channel Impedance Value: 310 Ohm
Lead Channel Impedance Value: 430 Ohm
Lead Channel Pacing Threshold Amplitude: 0.5 V
Lead Channel Pacing Threshold Amplitude: 1 V
Lead Channel Pacing Threshold Pulse Width: 0.4 ms
Lead Channel Pacing Threshold Pulse Width: 0.4 ms
Lead Channel Sensing Intrinsic Amplitude: 3.4 mV
Lead Channel Sensing Intrinsic Amplitude: 7.5 mV
Lead Channel Setting Pacing Amplitude: 2 V
Lead Channel Setting Pacing Amplitude: 2.5 V
Lead Channel Setting Pacing Pulse Width: 0.4 ms
Lead Channel Setting Sensing Sensitivity: 0.5 mV
Pulse Gen Serial Number: 7179320

## 2021-05-30 NOTE — Progress Notes (Signed)
Julie Saunders followed-up via Email regarding her exercise and wellness plan. She states that she was able to make it to the gym as reported previously last week. However, she was unable to go back since that initial session, due to her vertigo symptoms- she is receiving physical therapy.   She recovered for several days and is hopeful to return to the gym later this week. She states although she is frustrated by her limitations, she is not discouraged. She states she likes the bike and the seated rowing machine.   Currently she would like to continue to try the 10 minute walk (one way), 10 minutes on the recumbent bike, Break between switching to the rowing machine- and complete 10 minutes on the rower at a slower pace. All while monitoring her symptoms and progressing as she is able.   I encouraged her if she was going to continue the gym sessions, that she should attempt to stick with seated exercises, outside f her walk to and from the gym.Take her time walking and take breaks in between exercises.   She is to report via Email to [facility] for a follow-up in 2 weeks (Wednesday August 3rd).     Landis Martins, MS, ACSM, [facility] Clinical Exercise Physiologist/ Health and Wellness Coach

## 2021-05-31 ENCOUNTER — Encounter (HOSPITAL_COMMUNITY): Payer: Self-pay | Admitting: Internal Medicine

## 2021-05-31 ENCOUNTER — Other Ambulatory Visit: Payer: Self-pay

## 2021-05-31 ENCOUNTER — Ambulatory Visit (HOSPITAL_COMMUNITY)
Admission: RE | Admit: 2021-05-31 | Discharge: 2021-05-31 | Disposition: A | Discharge status: Home / Self Care | Payer: 59 | Source: Ambulatory Visit | Attending: Internal Medicine | Admitting: Internal Medicine

## 2021-05-31 VITALS — BP 124/88 | HR 62 | Wt 184.8 lb

## 2021-05-31 DIAGNOSIS — R519 Headache, unspecified: Secondary | ICD-10-CM | POA: Insufficient documentation

## 2021-05-31 DIAGNOSIS — Z8249 Family history of ischemic heart disease and other diseases of the circulatory system: Secondary | ICD-10-CM | POA: Diagnosis not present

## 2021-05-31 DIAGNOSIS — Z8674 Personal history of sudden cardiac arrest: Secondary | ICD-10-CM | POA: Insufficient documentation

## 2021-05-31 DIAGNOSIS — I5022 Chronic systolic (congestive) heart failure: Secondary | ICD-10-CM | POA: Insufficient documentation

## 2021-05-31 DIAGNOSIS — R42 Dizziness and giddiness: Secondary | ICD-10-CM | POA: Diagnosis present

## 2021-05-31 DIAGNOSIS — Z79899 Other long term (current) drug therapy: Secondary | ICD-10-CM | POA: Diagnosis not present

## 2021-05-31 DIAGNOSIS — Z7901 Long term (current) use of anticoagulants: Secondary | ICD-10-CM | POA: Diagnosis not present

## 2021-05-31 DIAGNOSIS — Z95810 Presence of automatic (implantable) cardiac defibrillator: Secondary | ICD-10-CM | POA: Diagnosis not present

## 2021-05-31 DIAGNOSIS — K5090 Crohn's disease, unspecified, without complications: Secondary | ICD-10-CM | POA: Diagnosis not present

## 2021-05-31 DIAGNOSIS — I472 Ventricular tachycardia, unspecified: Secondary | ICD-10-CM

## 2021-05-31 MED ORDER — LOSARTAN POTASSIUM 25 MG PO TABS: 12.5000 mg | ORAL_TABLET | Freq: Every day | ORAL | 3 refills | Status: DC

## 2021-05-31 NOTE — Progress Notes (Signed)
ADVANCED HF CLINIC NOTE  Referring Physician: Alroy Dust, L. [place]is Primary Care: Alroy Dust, L. Dean Primary Cardiologist: Virl Axe  HPI:  35 yo female with PMH of Crohn's disease, migraine headaches, Chronic Systolic CHF, SVT s/p ablation 06/08/15, Vtach, presumed long QT, s/p ICD implant,   Initially she presented to [facility] ER on 08/31/14 after collapse at home in the setting of prolonged diarrhea 2/2 crohn's disease.  Arrived with K of 2.6. She was unresponsive for approx. 10-15 minutes before fire department arrived and initiated CPR for 15 minutes until EMS arrived and initiated ACLS with CPR for 15 more minutes prior to ROSC.  Pt was defibrillated 4x for vfib, and received 4 rounds of epinephrine, 150 mg amiodarone, narcan, D50, and was intubated.  10/24- rewarmed, followed commands.  She had an ECHO 08/31/14 with EF 25-30% which was thought to be secondary to her arrest, given that her repeat on 09/08/14 EF improved to 60%. Post cardiac arrest she developed sinus bradycardia and ATN.  Because of her acute kidney injury secondary to the arrest she could not undergo cardiac catheterization.  09/11/14 had epinephrine infusion observation with rare ectopic beats and stable QT at 477mec.  Also had flecainide brugada challenge which was negative.  Stress Myoview on 09/12/14 showed no evidence of prior infarct or ischemia, mild diffuse global hypokinesis, EF 52%.  She was evaluated for ICD placement and the decision was made to place a dual chamber St Jude  ICD, serial number  [id number] on 09/11/14.  Cardiac meds on discharge were spiro 12.562mand propanolol 8077m   12/08/2014 defibrillator discharged and she went to the ED interrogation revealed episode of polymorphic Vtach followed by SVT/atrial tach initial rate 240. EP consulted and restarted her propanolol (was stopped at outpatient visit due to dizziness) and to follow up with Dr. KleCaryl ComesDr. KleCaryl Comescreased propanolol to 120m42mecommended and  performed SVT ablation 06/08/15.    08/2016 came to her outpatient visit with Dr. KleiCaryl Comes was severely hypotensive found to have lost 24 additional pounds related to Crohn's. No further SVT or Vtach noted.  Propanolol d/ced.  [facility] to [facility] at that time started on steroids, TPN.    [facility] the [facility], no real trouble with arrhythmias ECHO studies stable normal EF until 11/06/2020 where her EF dropped down to 35-40%.  Coreg 3.125 started but GDMT has been limited in the past by dizziness, orthostasis and low bp.  Initially started BID then decreased to at bedtime.  Feels her Crohn's disease currently is well controlled.    We saw her for the first time in 5/22. I felt EF closer to 45-50% and tha symptoms related more to deconditioning then HF. Referred to our exercise physiologist who has been coaching her. Going to [employer] doing exercise bike and rowing machine. Major limiting factor is dizziness and HAs. Denies SOB. Follows her BP and HRs closely. SBP 112-144 with HR 49-60s. Tolerating spiro well. Has seen Neurology and PT about her dizziness. CT brain ok. No brain MRI with ICD.  They suggested she f/u with ENT.   Labs 03/19/21  Scr 1.13 K 3.9    Some Asthma in family, Grandmother, Uncle  Mother with MVP, MI at age 64  89ast Medical History:  Diagnosis Date   AICD (automatic cardioverter/defibrillator) present 09/14/2015   STJ dual chamber ICD implanted by Dr  Caryl Comes for aborted cardiac arrest   Allergy    Bradycardia    Cardiac arrest ([place]) 08/31/2014   Crohn disease ([place])    Depression    History of blood transfusion 09/2012   "had allergic rxn to one of my Crohn's RX"    Migraine    "a few times/yr" (06/08/2015)   Pancytopenia ([place]) 11/23/2012   Status post radiofrequency ablation for arrhythmia, atrial tach 06/08/15 06/08/2015   SVT (supraventricular tachycardia) ([place])  12/08/2014   likely an atrial tachycardia with CL 250 msec    Current Outpatient Medications  Medication Sig Dispense Refill   acetaminophen (TYLENOL) 325 MG tablet Take 650 mg by mouth every 6 (six) hours as needed for headache.     budesonide (ENTOCORT EC) 3 MG 24 hr capsule Take 9 mg by mouth daily.     cetirizine (ZYRTEC) 10 MG tablet Take 10 mg by mouth daily.     Cholecalciferol 250 MCG (10000 UT) CAPS Take by mouth.     Cobalamin Combinations (B-12) [phone] MCG SUBL Place 1 tablet under the tongue daily.     diazepam (VALIUM) 5 MG tablet Take 1 tablet (5 mg total) by mouth every 12 (twelve) hours as needed (vertigpo). 10 tablet 0   Galcanezumab-gnlm (EMGALITY) 120 MG/ML SOAJ Inject 120 mg into the skin every 30 (thirty) days. 1 mL 11   ondansetron (ZOFRAN) 4 MG tablet Take 4 mg by mouth as needed for nausea or vomiting.     Pediatric Multiple Vit-C-FA (ANIMAL CHEWABLE MULTIVITAMIN PO) Take 1 tablet by mouth daily.     potassium chloride SA (KLOR-CON M20) 20 MEQ tablet Take 1 tablet (20 mEq total) by mouth daily. 90 tablet 3   spironolactone (ALDACTONE) 25 MG tablet Take 0.5 tablets (12.5 mg total) by mouth at bedtime. 15 tablet 6   thiamine (VITAMIN B-1) 100 MG tablet Take 300 mg by mouth daily.     ustekinumab (STELARA) 90 MG/ML SOSY injection Use as directed     No current facility-administered medications for this encounter.    Allergies  Allergen Reactions   Sulfa Antibiotics Rash   Sulfacetamide Sodium Rash      Social History   Socioeconomic History   Marital status: Single    Spouse name: Not on file   Number of children: Not on file   Years of education: Not on file   Highest education level: Not on file  Occupational History   Not on file  Tobacco Use   Smoking status: Never   Smokeless tobacco: Never  Vaping Use   Vaping Use: Never used  Substance and Sexual Activity   Alcohol use: Yes    Comment: occasional/social   Drug use: No   Sexual activity:  Never  Other Topics Concern   Not on file  Social History Narrative   Not on file   Social Determinants of Health   Financial Resource Strain: Not on file  Food Insecurity: Not on file  Transportation Needs: Not on file  Physical Activity: Not on file  Stress: Not on file  Social Connections: Not on file  Intimate Partner Violence: Not on file      Family History  Problem Relation Age of Onset   Heart attack Mother    Migraines Mother    Heart disease Mother    Heart attack Maternal Grandmother    Heart disease Maternal Grandmother    Diabetes Maternal Grandfather    Asthma Paternal Grandmother    Diabetes Paternal  Grandfather     Vitals:   05/31/21 1448  BP: 124/88  Pulse: 62  SpO2: 96%  Weight: 83.8 kg (184 lb 12.8 oz)    PHYSICAL EXAM: General:  Well appearing. No resp difficulty HEENT: normal Neck: supple. no JVD. Carotids 2+ bilat; no bruits. No lymphadenopathy or thryomegaly appreciated. Cor: PMI nondisplaced. Regular rate & rhythm. No rubs, gallops or murmurs. Lungs: clear Abdomen: obese soft, nontender, nondistended. No hepatosplenomegaly. No bruits or masses. Good bowel sounds. Extremities: no cyanosis, clubbing, rash, edema Neuro: alert & orientedx3, cranial nerves grossly intact. moves all 4 extremities w/o difficulty. Affect pleasant  ASSESSMENT & PLAN:  1. Chronic Systolic CHF: -86/77/37 with EF 25-30% which was thought to be secondary to her cardiac arrest which occurred in the setting of crohn's flare and severe hypokalemia, - - Repeat ECHO on 09/08/14 EF improved to 60%  -Could not undergo LHC due to ATN from arrest, she had dual chamber St. Jude ICD implanted 09/13/14 - Subsequent ECHO studies with normal EF althout 2020 low normal at 50-55%  - Echo12/28/2021 EF read as  35-40% - I have reviewed echos. I think EF from 12/21 Likely ~45% if not 45-50% with normal diastolic function is very reassuring - ReDS low at 23% - I suspect symptoms related  more to deconditioning and her dizziness much more then HF.  - Tolerating spiro 12.5 qhs - Add losartan 12.5 qhs - Volume status up on device but clinically ok  - ICD interrogated personally No VT/AF   2. Crohn's Disease: -now [facility] -on stelara, budesonide  3. H/o SVT: - felt to be atrial tach had an ablation 06/08/15 - in NSR now  - no change  4. HAs/dizziness - doubt cardiac - follows with Neurology and PT  Glori Bickers, MD  3:14 PM

## 2021-05-31 NOTE — Patient Instructions (Addendum)
Good to see you today  Start Losartan 12.5 mg (1/2 tablet) every night @bedtime   Your physician has requested that you have an echocardiogram. Echocardiography is a painless test that uses sound waves to create images of your heart. It provides your doctor with information about the size and shape of your heart and how well your heart's chambers and valves are working. This procedure takes approximately one hour. There are no restrictions for this procedure.   Your physician recommends that you schedule a follow-up appointment in: 4 months with echocardiogram  If you have any questions or concerns before your next appointment please send [place] a message through [place] or call our office at [phone].    TO LEAVE A MESSAGE FOR THE NURSE SELECT OPTION 2, PLEASE LEAVE A MESSAGE INCLUDING: YOUR NAME DATE OF BIRTH CALL BACK NUMBER REASON FOR CALL**this is important as we prioritize the call backs  YOU WILL RECEIVE A CALL BACK THE SAME DAY AS LONG AS YOU CALL BEFORE 4:00 PM  milAt the [facility], you and your health needs are our priority. As part of our continuing mission to provide you with exceptional heart care, we have created designated Provider Care Teams. These Care Teams include your primary Cardiologist (physician) and Advanced Practice Providers (APPs- Physician Assistants and Nurse Practitioners) who all work together to provide you with the care you need, when you need it.   You may see any of the following providers on your designated Care Team at your next follow up: Dr Glori Bickers Dr Loralie Champagne Dr Patrice Paradise, NP Lyda Jester, [place] Ginnie Smart Audry Riles, PharmD   Please be sure to bring in all your medications bottles to every appointment.

## 2021-06-03 ENCOUNTER — Telehealth: Payer: Self-pay

## 2021-06-03 NOTE — Telephone Encounter (Signed)
We received a call from this patient enquiring about the status of their ENT referral. Can you please check on this and give the patient a call back to update her?  Thanks!

## 2021-06-03 NOTE — Telephone Encounter (Signed)
Sent ENT referral to [facility]. P: [phone].  I called pt. She would like to try Dr. Deeann Saint office as well. P: [phone].

## 2021-06-12 ENCOUNTER — Encounter (HOSPITAL_COMMUNITY): Payer: Self-pay | Admitting: *Deleted

## 2021-06-12 NOTE — Progress Notes (Signed)
Patient is still struggling with getting to exercise and to the gym without dizziness or fatigue wiping her out, therefore she has only attended once and this was at minimum 2 weeks ago. However she is not having problems with her activities of daily living or hindering her enjoyment walking around with friends.   She was encouraged to continue walking and shopping with friends as this is a level of exercise and physical activity that she can tolerate without triggering migraines or dizziness. She was also encouraged to attempt the gym again at a shorter duration since she has missed several weeks since her initial visit.   We will plan a follow-up email in 3 weeks. Aug 24th.    Landis Martins, MS, ACSM, [facility] Clinical Exercise Physiologist/ Health and Wellness Coach

## 2021-06-21 NOTE — Progress Notes (Signed)
Remote ICD transmission.   

## 2021-07-03 ENCOUNTER — Encounter (HOSPITAL_COMMUNITY): Payer: Self-pay | Admitting: *Deleted

## 2021-07-03 NOTE — Progress Notes (Signed)
Patient emailed update for 3 week follow-up. She has only attended [employer] once since joining over a month ago, her first exercise at [facility] 7/13. She has been reporting on schedule, however is not participating in exercise plan due to dizziness, or menstruation fatigue or GI issues. She has been encouraged to try to walk daily- even when she is not able to make it to the fitness center. She was encouraged to follow-up with her GI doctor for further evaluation of multiple GI disturbances and complaints. At this time- her exercise plan has not changed and she will attempt to walk daily. We have not scheduled further follow-up but have left the coaching communication open for when she has GI resolution.     Kathy Breach, MS, ACSM, [facility] Clinical Exercise Physiologist/ Health and Wellness Coach

## 2021-07-23 ENCOUNTER — Encounter: Payer: Self-pay | Admitting: Adult Health

## 2021-08-20 ENCOUNTER — Other Ambulatory Visit (HOSPITAL_COMMUNITY): Payer: Self-pay | Admitting: Internal Medicine

## 2021-08-29 ENCOUNTER — Ambulatory Visit (INDEPENDENT_AMBULATORY_CARE_PROVIDER_SITE_OTHER): Payer: 59

## 2021-08-29 DIAGNOSIS — I5022 Chronic systolic (congestive) heart failure: Secondary | ICD-10-CM | POA: Diagnosis not present

## 2021-08-29 LAB — CUP PACEART REMOTE DEVICE CHECK
Battery Remaining Longevity: 29 mo
Battery Remaining Percentage: 29 %
Battery Voltage: 2.83 V
Brady Statistic AP VP Percent: 1 %
Brady Statistic AP VS Percent: 43 %
Brady Statistic AS VP Percent: 1 %
Brady Statistic AS VS Percent: 55 %
Brady Statistic RA Percent Paced: 39 %
Brady Statistic RV Percent Paced: 1 %
Date Time Interrogation Session: 20221020085410
HighPow Impedance: 78 Ohm
HighPow Impedance: 78 Ohm
Implantable Lead Implant Date: 20151104
Implantable Lead Implant Date: 20151104
Implantable Lead Location: 753859
Implantable Lead Location: 753860
Implantable Lead Model: 5076
Implantable Pulse Generator Implant Date: 20151104
Lead Channel Impedance Value: 310 Ohm
Lead Channel Impedance Value: 410 Ohm
Lead Channel Pacing Threshold Amplitude: 0.5 V
Lead Channel Pacing Threshold Amplitude: 1 V
Lead Channel Pacing Threshold Pulse Width: 0.4 ms
Lead Channel Pacing Threshold Pulse Width: 0.4 ms
Lead Channel Sensing Intrinsic Amplitude: 3.5 mV
Lead Channel Sensing Intrinsic Amplitude: 7.9 mV
Lead Channel Setting Pacing Amplitude: 2 V
Lead Channel Setting Pacing Amplitude: 2.5 V
Lead Channel Setting Pacing Pulse Width: 0.4 ms
Lead Channel Setting Sensing Sensitivity: 0.5 mV
Pulse Gen Serial Number: 7179320

## 2021-09-06 NOTE — Progress Notes (Signed)
Remote ICD transmission.   

## 2021-09-10 ENCOUNTER — Ambulatory Visit: Payer: 59 | Admitting: Internal Medicine

## 2021-09-10 ENCOUNTER — Other Ambulatory Visit: Payer: Self-pay

## 2021-09-10 ENCOUNTER — Encounter: Payer: Self-pay | Admitting: Internal Medicine

## 2021-09-10 VITALS — Ht 65.0 in | Wt 180.0 lb

## 2021-09-10 DIAGNOSIS — I5022 Chronic systolic (congestive) heart failure: Secondary | ICD-10-CM | POA: Diagnosis not present

## 2021-09-10 DIAGNOSIS — I428 Other cardiomyopathies: Secondary | ICD-10-CM | POA: Diagnosis not present

## 2021-09-10 DIAGNOSIS — I471 Supraventricular tachycardia: Secondary | ICD-10-CM

## 2021-09-10 DIAGNOSIS — Z95810 Presence of automatic (implantable) cardiac defibrillator: Secondary | ICD-10-CM

## 2021-09-10 DIAGNOSIS — I472 Ventricular tachycardia, unspecified: Secondary | ICD-10-CM | POA: Diagnosis not present

## 2021-09-10 NOTE — Patient Instructions (Signed)
Medication Instructions:  Your physician recommends that you continue on your current medications as directed. Please refer to the Current Medication list given to you today.  *If you need a refill on your cardiac medications before your next appointment, please call your pharmacy*   Lab Work: None ordered.  If you have labs (blood work) drawn today and your tests are completely normal, you will receive your results only by: [place] (if you have MyChart) OR A paper copy in the mail If you have any lab test that is abnormal or we need to change your treatment, we will call you to review the results.   Testing/Procedures: None ordered.    Follow-Up: At [facility], you and your health needs are our priority.  As part of our continuing mission to provide you with exceptional heart care, we have created designated Provider Care Teams.  These Care Teams include your primary Cardiologist (physician) and Advanced Practice Providers (APPs -  Physician Assistants and Nurse Practitioners) who all work together to provide you with the care you need, when you need it.  We recommend signing up for the patient portal called "MyChart".  Sign up information is provided on this After Visit Summary.  MyChart is used to connect with patients for Virtual Visits (Telemedicine).  Patients are able to view lab/test results, encounter notes, upcoming appointments, etc.  Non-urgent messages can be sent to your provider as well.   To learn more about what you can do with MyChart, go to [email or web address].    Your next appointment:   12 months with Dr Caryl Comes

## 2021-09-10 NOTE — Progress Notes (Signed)
Patient Care Team: Alroy Dust, L.Marlou Sa, MD as PCP - General (Family Medicine)   HPI  Julie Saunders is a 35 y.o. female Seen in follow-up for aborted cardiac arrest 2015.  No specific cause was identified. She was treated with beta blockers initially but these were stopped because of hypotension and dizziness. During cardiac rehabilitation she had an episode with ICD discharges.    She has subsequently undergone EPS RFCA  And these records were reveiewed>>ectopic tachycardia successful  Recurrent palps >> egrams suggest PVC/AVNRT or Atrial Tach with 1 AVB  Has been following with CHF clinic   The patient denies chest pain, shortness of breath, nocturnal dyspnea, orthopnea or peripheral edema.  There have been no palpitations, lightheadedness or syncope.  Complains of fatigue and abdominal pain 2/2 flare of crohns .        She reminds me that her mother, only child,died suddenly at 82+ yo "heart attach" no autopsy ? Familial hypokalemia ??      DATE TEST EF   11/15 MYOVIEW 52% No ischemia/infarct  12/17 Echo 55-65%   12/21 Echo  35-40%       Dizziness now for months, initially 24/7 now somewhat improved; undergoing neuro eval   Date Cr K Hgb TSH  1/16      1.053  10/17 1.04 2.8  1.69   10/18 1.01 4.0    8/19 1.08 3.8    6/20 1.13 4.5 12.3   9/22 1.2 4.0 13.1    Reviewed family history, mother died suddenly 2 months before her daughter's cardiac arrest.  Somehow this association was missed.  I reviewed her mother's home records, nothing really is informative.  She had a history of mitral valve prolapse apparently according to the daughter no other cardiac history.  No echo is available.            Alb   Past Medical History:  Diagnosis Date   AICD (automatic cardioverter/defibrillator) present 09/14/2015   STJ dual chamber ICD implanted by Dr Caryl Comes for aborted cardiac arrest   Allergy    Bradycardia    Cardiac arrest ([place]) 08/31/2014   Crohn disease ([place])     Depression    History of blood transfusion 09/2012   "had allergic rxn to one of my Crohn's RX"    Migraine    "a few times/yr" (06/08/2015)   Pancytopenia ([place]) 11/23/2012   Status post radiofrequency ablation for arrhythmia, atrial tach 06/08/15 06/08/2015   SVT (supraventricular tachycardia) ([place]) 12/08/2014   likely an atrial tachycardia with CL 250 msec    Past Surgical History:  Procedure Laterality Date   ABLATION     ELECTROPHYSIOLOGIC STUDY N/A 06/08/2015   Procedure: SVT Ablation;  Surgeon: Thompson Grayer, MD;  Location: [facility];  Service: Cardiovascular;  Laterality: N/A;   ELECTROPHYSIOLOGY STUDY N/A 09/11/2014   Procedure: ELECTROPHYSIOLOGY STUDY;  Surgeon: Deboraha Sprang, MD;  Location[facility] C[facility] LAB;  Service: Cardiovascular;  Laterality: N/A;   IMPLANTABLE CARDIOVERTER DEFIBRILLATOR IMPLANT N/A 09/13/2014   Procedure: IMPLANTABLE CARDIOVERTER DEFIBRILLATOR IMPLANT;  Surgeon: Deboraha Sprang, MD;  Location: [facility] CATH LAB;  Service: Cardiovascular;  Laterality: N/A;; STJ dual chamber ICD implanted by Dr Caryl Comes for aborted cardiac arrest    Current Outpatient Medications  Medication Sig Dispense Refill   acetaminophen (TYLENOL) 325 MG tablet Take 650 mg by mouth every 6 (six) hours as needed for headache.     budesonide (ENTOCORT EC) 3 MG 24 hr capsule  Take 9 mg by mouth daily.     cetirizine (ZYRTEC) 10 MG tablet Take 10 mg by mouth daily.     Cholecalciferol (VITAMIN D3) 50 MCG (2000 UT) CHEW Chew by mouth daily.     Cobalamin Combinations (B-12) [phone] MCG SUBL Place 1 tablet under the tongue daily.     diazepam (VALIUM) 5 MG tablet Take 1 tablet (5 mg total) by mouth every 12 (twelve) hours as needed (vertigpo). 10 tablet 0   Galcanezumab-gnlm (EMGALITY) 120 MG/ML SOAJ Inject 120 mg into the skin every 30 (thirty) days. 1 mL 11   losartan (COZAAR) 25 MG tablet Take 0.5 tablets (12.5 mg total) by mouth daily. 90 tablet 3   ondansetron (ZOFRAN) 4 MG tablet Take 4 mg by  mouth as needed for nausea or vomiting.     Pediatric Multiple Vit-C-FA (ANIMAL CHEWABLE MULTIVITAMIN PO) Take 1 tablet by mouth daily.     potassium chloride SA (KLOR-CON M20) 20 MEQ tablet Take 1 tablet (20 mEq total) by mouth daily. 90 tablet 3   predniSONE (DELTASONE) 20 MG tablet Take 20 mg by mouth. Prednisone Taper     spironolactone (ALDACTONE) 25 MG tablet TAKE 0.5 TABLETS BY MOUTH AT BEDTIME. 30 tablet 0   thiamine (VITAMIN B-1) 100 MG tablet Take 300 mg by mouth daily.     ustekinumab (STELARA) 90 MG/ML SOSY injection Use as directed     Cholecalciferol 250 MCG (10000 UT) CAPS Take by mouth. (Patient not taking: Reported on 09/10/2021)     No current facility-administered medications for this visit.    Allergies  Allergen Reactions   Sulfa Antibiotics Rash   Sulfacetamide Sodium Rash    Review of Systems negative except from HPI and PMH  Physical Exam Ht 5' 5"  (1.651 m)   Wt 180 lb (81.6 kg)   BMI 29.95 kg/m  Well developed and well nourished in no acute distress HENT normal Neck supple with JVP-flat Clear Device pocket well healed; without hematoma or erythema.  There is no tethering  Regular rate and rhythm, no  gallop 2/6 murmur Abd-soft with active BS No Clubbing cyanosis   edema Skin-warm and dry A & Oriented  Grossly normal sensory and motor function  ECG sinus @ 61 14/10/42    Assessment and  Plan  Aborted cardiac arrest  ICD St Jude     Cardiomyopathy-progressive  Supraventricular tachycardia  status post RFCA-JA  Crohn's disease  Dizziness  Rightward axis new  Iron deficiency anemia   Cardiomyopathy continue losartan 12.5 aldactone 12.5   bradycardia and BP has made additions and uptitration difficult  Lightheadedness unassociated with BP changes, symptoms most consistent with neuro/ENT but low BP always a concern  No interval ventricular arrhyhtmia  Encouraged exercise   ICD function normal

## 2021-09-19 ENCOUNTER — Encounter: Payer: Self-pay | Admitting: Adult Health

## 2021-09-19 MED ORDER — DIAZEPAM 5 MG PO TABS: 5.0000 mg | ORAL_TABLET | Freq: Two times a day (BID) | ORAL | 0 refills | Status: DC | PRN

## 2021-09-19 NOTE — Telephone Encounter (Signed)
Last seen 01-2021.

## 2021-09-19 NOTE — Telephone Encounter (Signed)
Pt is up to date on her appts. Her last and only recent fill of Diazepam on the [place] registry was 10 tablets on 07/12/20. Will send request to MM NP.

## 2021-10-01 ENCOUNTER — Encounter (HOSPITAL_COMMUNITY): Payer: Self-pay | Admitting: Internal Medicine

## 2021-10-01 ENCOUNTER — Ambulatory Visit (HOSPITAL_COMMUNITY)
Admission: RE | Admit: 2021-10-01 | Discharge: 2021-10-01 | Disposition: A | Discharge status: Home / Self Care | Payer: 59 | Source: Ambulatory Visit | Attending: Family Medicine | Admitting: Family Medicine

## 2021-10-01 ENCOUNTER — Ambulatory Visit (HOSPITAL_BASED_OUTPATIENT_CLINIC_OR_DEPARTMENT_OTHER)
Admission: RE | Admit: 2021-10-01 | Discharge: 2021-10-01 | Disposition: A | Discharge status: Home / Self Care | Payer: 59 | Source: Ambulatory Visit | Attending: Internal Medicine | Admitting: Internal Medicine

## 2021-10-01 VITALS — BP 168/86 | HR 68 | Wt 182.2 lb

## 2021-10-01 DIAGNOSIS — I5022 Chronic systolic (congestive) heart failure: Secondary | ICD-10-CM

## 2021-10-01 DIAGNOSIS — Z8674 Personal history of sudden cardiac arrest: Secondary | ICD-10-CM | POA: Diagnosis not present

## 2021-10-01 DIAGNOSIS — I428 Other cardiomyopathies: Secondary | ICD-10-CM

## 2021-10-01 DIAGNOSIS — I471 Supraventricular tachycardia: Secondary | ICD-10-CM

## 2021-10-01 DIAGNOSIS — G43909 Migraine, unspecified, not intractable, without status migrainosus: Secondary | ICD-10-CM | POA: Insufficient documentation

## 2021-10-01 DIAGNOSIS — K50919 Crohn's disease, unspecified, with unspecified complications: Secondary | ICD-10-CM | POA: Diagnosis not present

## 2021-10-01 DIAGNOSIS — R42 Dizziness and giddiness: Secondary | ICD-10-CM | POA: Diagnosis not present

## 2021-10-01 DIAGNOSIS — I5042 Chronic combined systolic (congestive) and diastolic (congestive) heart failure: Secondary | ICD-10-CM | POA: Insufficient documentation

## 2021-10-01 DIAGNOSIS — K5090 Crohn's disease, unspecified, without complications: Secondary | ICD-10-CM | POA: Insufficient documentation

## 2021-10-01 DIAGNOSIS — Z95810 Presence of automatic (implantable) cardiac defibrillator: Secondary | ICD-10-CM | POA: Diagnosis not present

## 2021-10-01 DIAGNOSIS — Z79899 Other long term (current) drug therapy: Secondary | ICD-10-CM | POA: Diagnosis not present

## 2021-10-01 DIAGNOSIS — Z7952 Long term (current) use of systemic steroids: Secondary | ICD-10-CM | POA: Insufficient documentation

## 2021-10-01 DIAGNOSIS — Z8679 Personal history of other diseases of the circulatory system: Secondary | ICD-10-CM | POA: Insufficient documentation

## 2021-10-01 LAB — ECHOCARDIOGRAM COMPLETE
Area-P 1/2: 2.76 cm2
Calc EF: 56.6 %
S' Lateral: 4 cm
Single Plane A2C EF: 56.6 %
Single Plane A4C EF: 55.8 %

## 2021-10-01 MED ORDER — SPIRONOLACTONE 25 MG PO TABS: 25.0000 mg | ORAL_TABLET | Freq: Every day | ORAL | 0 refills | Status: DC

## 2021-10-01 NOTE — Progress Notes (Signed)
ADVANCED HF CLINIC NOTE  Referring Physician: Alroy Dust, L. [place] Primary Care: Alroy Dust, L. [place] Primary Cardiologist: Virl Axe  HPI:  35 yo female with PMH of Crohn's disease, migraine headaches, Chronic Systolic CHF, SVT s/p ablation 06/08/15, Vtach, presumed long QT, s/p ICD implant. Sees Neurology for vertigo and headaches.  Initially she presented to [facility] ER on 08/31/14 after collapse at home in the setting of prolonged diarrhea 2/2 crohn's disease.  Arrived with K of 2.6. She was unresponsive for approx. 10-15 minutes before fire department arrived and initiated CPR for 15 minutes until EMS arrived and initiated ACLS with CPR for 15 more minutes prior to ROSC.  Pt was defibrillated 4x for vfib, and received 4 rounds of epinephrine, 150 mg amiodarone, narcan, D50, and was intubated.  10/24- rewarmed, followed commands.  She had an ECHO 08/31/14 with EF 25-30% which was thought to be secondary to her arrest, given that her repeat on 09/08/14 EF improved to 60%. Post cardiac arrest she developed sinus bradycardia and ATN.  Because of her acute kidney injury secondary to the arrest she could not undergo cardiac catheterization.  09/11/14 had epinephrine infusion observation with rare ectopic beats and stable QT at 418mec.  Also had flecainide brugada challenge which was negative.  Stress Myoview on 09/12/14 showed no evidence of prior infarct or ischemia, mild diffuse global hypokinesis, EF 52%.  She underwent dual chamber St Jude  ICD on 09/11/14.    12/08/2014 defibrillator discharged and she went to the ED interrogation revealed episode of polymorphic Vtach followed by SVT/atrial tach initial rate 240. EP consulted and restarted her propanolol (was stopped at outpatient visit due to dizziness) and to follow up with Dr. KCaryl Comes  Dr. KCaryl Comesincreased propanolol to 120 mg and performed SVT ablation 06/08/15.    08/2016 came to her outpatient visit with Dr. KCaryl Comesand was severely hypotensive found to  have lost 24 additional pounds related to Crohn's. No further SVT or Vtach noted.  Propanolol d/ced.  [facility] to [facility] time started on steroids, TPN.    [facility] doing okay for the next few years, no real trouble with arrhythmias ECHO studies stable normal EF until 11/06/2020 where her EF dropped down to 35-40%.  Coreg 3.125 started but GDMT limited by dizziness, orthostasis and low bp.  Initially started BID then decreased to at bedtime.    We saw her for the first time in 5/22. Dr. BHaroldine Lawsfelt EF closer to 45-50% and than symptoms related more to deconditioning than HF. Referred to our exercise physiologist who was coaching her. Participated in PT earlier this year. Discharged d/t lack of progress. Limited by dizziness. Recently saw ENT and reports she was felt to have nerve damage. PT recommended.  Here today for routine f/u. Denies any dyspnea or chest pain. Notes occasional palpitations. No orthopnea, PND or leg edema.   Currently on prednisone taper for worsening inflammatory stricturing disease. [facility]  Echo today with improvement in LVEF to 55%  Device check today: No atrial or ventricular arrhythmias. Thoracic impedence via CoreVue okay.  FH:  Some Asthma in family, G46 Uncle  Mother with MVP, MI at age 34  Past Medical History:  Diagnosis Date   AICD (automatic cardioverter/defibrillator) present 09/14/2015   STJ dual chamber ICD implanted by Dr KCaryl Comesfor aborted cardiac arrest   Allergy    Bradycardia  Cardiac arrest ([place]) 08/31/2014   Crohn disease ([place])    Depression    History of blood transfusion 09/2012   "had allergic rxn to one of my Crohn's RX"    Migraine    "a few times/yr" (06/08/2015)   Pancytopenia ([place]) 11/23/2012   Status post radiofrequency ablation for arrhythmia, atrial tach 06/08/15 06/08/2015   SVT (supraventricular tachycardia)  ([place]) 12/08/2014   likely an atrial tachycardia with CL 250 msec    Current Outpatient Medications  Medication Sig Dispense Refill   acetaminophen (TYLENOL) 325 MG tablet Take 650 mg by mouth every 6 (six) hours as needed for headache.     budesonide (ENTOCORT EC) 3 MG 24 hr capsule Take 9 mg by mouth daily.     cetirizine (ZYRTEC) 10 MG tablet Take 10 mg by mouth daily.     Cholecalciferol (VITAMIN D3) 50 MCG (2000 UT) CHEW Chew by mouth daily.     Cobalamin Combinations (B-12) [phone] MCG SUBL Place 1 tablet under the tongue daily.     diazepam (VALIUM) 5 MG tablet Take 1 tablet (5 mg total) by mouth every 12 (twelve) hours as needed (vertigpo). 10 tablet 0   Galcanezumab-gnlm (EMGALITY) 120 MG/ML SOAJ Inject 120 mg into the skin every 30 (thirty) days. 1 mL 11   losartan (COZAAR) 25 MG tablet Take 0.5 tablets (12.5 mg total) by mouth daily. 90 tablet 3   ondansetron (ZOFRAN) 4 MG tablet Take 4 mg by mouth as needed for nausea or vomiting.     Pediatric Multiple Vit-C-FA (ANIMAL CHEWABLE MULTIVITAMIN PO) Take 1 tablet by mouth daily.     potassium chloride SA (KLOR-CON M20) 20 MEQ tablet Take 1 tablet (20 mEq total) by mouth daily. 90 tablet 3   predniSONE (DELTASONE) 5 MG tablet Patient takes 1 tablet daily for 1 week.     thiamine (VITAMIN B-1) 100 MG tablet Take 300 mg by mouth daily.     ustekinumab (STELARA) 90 MG/ML SOSY injection Use as directed     spironolactone (ALDACTONE) 25 MG tablet Take 1 tablet (25 mg total) by mouth daily. 30 tablet 0   No current facility-administered medications for this encounter.    Allergies  Allergen Reactions   Sulfa Antibiotics Rash   Sulfacetamide Sodium Rash      Social History   Socioeconomic History   Marital status: Single    Spouse name: Not on file   Number of children: Not on file   Years of education: Not on file   Highest education level: Not on file  Occupational History   Not on file  Tobacco Use   Smoking status: Never    Smokeless tobacco: Never  Vaping Use   Vaping Use: Never used  Substance and Sexual Activity   Alcohol use: Yes    Comment: occasional/social   Drug use: No   Sexual activity: Never  Other Topics Concern   Not on file  Social History Narrative   Not on file   Social Determinants of Health   Financial Resource Strain: Not on file  Food Insecurity: Not on file  Transportation Needs: Not on file  Physical Activity: Not on file  Stress: Not on file  Social Connections: Not on file  Intimate Partner Violence: Not on file      Family History  Problem Relation Age of Onset   Heart attack Mother    Migraines Mother    Heart disease Mother    Heart attack Maternal Grandmother  Heart disease Maternal Grandmother    Diabetes Maternal Grandfather    Asthma Paternal Grandmother    Diabetes Paternal Grandfather     Vitals:   10/01/21 1524  BP: (!) 168/86  Pulse: 68  SpO2: 100%  Weight: 82.6 kg (182 lb 3.2 oz)     PHYSICAL EXAM: General:  Well appearing. No resp difficulty HEENT: normal Neck: supple. no JVD. Carotids 2+ bilat; no bruits.  Cor: PMI nondisplaced. Regular rate & rhythm. No rubs, gallops or murmurs. Lungs: clear Abdomen: obese soft, nontender, nondistended. No hepatosplenomegaly.  Extremities: no cyanosis, clubbing, rash, edema Neuro: alert & orientedx3, cranial nerves grossly intact. moves all 4 extremities w/o difficulty. Affect pleasant  ECG today: Sinus rhythm with atrial paced complexes and PACs in bigeminy pattern, 98 bpm  ASSESSMENT & PLAN:  1. 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 Jude ICD implanted 09/13/14 - Subsequent ECHO studies with normal EF althout 2020  low normal at 50-55%  - Echo 11/06/2020 EF read as  35-40% - EF from 12/21 Likely ~45% if not 45-50% with normal diastolic function - Echo today with improvement in EF to 55% - Volume appears stable. Not requiring diuretics - Continue spiro 12.5 qhs - Continue losartan 12.5 qhs - Not able to tolerate coreg and propanolol in the past d/t dizziness and orthostasis - Will not add SGLT2i d/t propensity for dizziness and hypotension.  - Clinic BP elevated today, but consistently 696-295M systolic at home - ICD interrogated personally. No VT or AF. Thoracic impedence okay.  2. Crohn's Disease: -now followed by [facility] GI -on stelara, budesonide and recently initiated prednisone taper  3. H/o SVT: - felt to be atrial tach had an ablation 06/08/15 - in NSR now  - recent palpitations may be due to PACs (present on ECG today) but does not tolerate beta blocker well - no change  4. HAs/dizziness - doubt cardiac - follows with Neurology  - Limited progress with PT. Had been referred to ENT  F/u: 1 year with echo  [facility], LINDSAY N, PA-C  3:59 PM   Patient seen and examined with the above-signed Advanced Practice Provider and/or Housestaff. I personally reviewed laboratory data, imaging studies and relevant notes. I independently examined the patient and formulated the important aspects of the plan. I have edited the note to reflect any of my changes or salient points. I have personally discussed the plan with the patient and/or family.  Doing well. NYHA II Volume status ok. Echo today EF 55%. On spiro and losartan. Unable to tolerate b-blocker in past.   General:  Well appearing. No resp difficulty HEENT: normal Neck: supple. no JVD. Carotids 2+ bilat; no bruits. No lymphadenopathy or thryomegaly appreciated. Cor: PMI nondisplaced. Regular rate & rhythm. No rubs, gallops or murmurs. Lungs: clear Abdomen: soft, nontender, nondistended. No hepatosplenomegaly. No bruits or masses. Good bowel  sounds. Extremities: no cyanosis, clubbing, rash, edema Neuro: alert & orientedx3, cranial nerves grossly intact. moves all 4 extremities w/o difficulty. Affect pleasant  Doing well. Echo reviewed personally. EF 55% (complete recovery). Continue current regimen. Have avoided SGLT2i due to frequent dizziness and concern to cause orthostasis. ICD interrogated personally in clinic. No VT/AF.   Glori Bickers, MD  5:26 PM

## 2021-10-01 NOTE — Patient Instructions (Signed)
Your physician recommends that you schedule a follow-up appointment in: 1 year with an echocardiogram (November 2023) **Call in September for appointment**  If you have any questions or concerns before your next appointment please send [place] a message through [place] or call our office at [phone].    TO LEAVE A MESSAGE FOR THE NURSE SELECT OPTION 2, PLEASE LEAVE A MESSAGE INCLUDING: YOUR NAME DATE OF BIRTH CALL BACK NUMBER REASON FOR CALL**this is important as we prioritize the call backs  YOU WILL RECEIVE A CALL BACK THE SAME DAY AS LONG AS YOU CALL BEFORE 4:00 PM  At the [facility], you and your health needs are our priority. As part of our continuing mission to provide you with exceptional heart care, we have created designated Provider Care Teams. These Care Teams include your primary Cardiologist (physician) and Advanced Practice Providers (APPs- Physician Assistants and Nurse Practitioners) who all work together to provide you with the care you need, when you need it.   You may see any of the following providers on your designated Care Team at your next follow up: Dr Glori Bickers Dr Haynes Kerns, NP Lyda Jester, [place] [facility] [place], [place] Audry Riles, PharmD   Please be sure to bring in all your medications bottles to every appointment.

## 2021-10-12 ENCOUNTER — Other Ambulatory Visit (HOSPITAL_COMMUNITY): Payer: Self-pay | Admitting: Internal Medicine

## 2021-11-13 ENCOUNTER — Other Ambulatory Visit: Payer: Self-pay | Admitting: Internal Medicine

## 2021-11-14 MED ORDER — POTASSIUM CHLORIDE CRYS ER 20 MEQ PO TBCR: 20.0000 meq | EXTENDED_RELEASE_TABLET | Freq: Every day | ORAL | 3 refills | Status: DC

## 2021-11-26 ENCOUNTER — Other Ambulatory Visit: Payer: Self-pay | Admitting: Internal Medicine

## 2021-11-28 ENCOUNTER — Ambulatory Visit (INDEPENDENT_AMBULATORY_CARE_PROVIDER_SITE_OTHER): Payer: 59

## 2021-11-28 DIAGNOSIS — I428 Other cardiomyopathies: Secondary | ICD-10-CM

## 2021-11-28 DIAGNOSIS — I5022 Chronic systolic (congestive) heart failure: Secondary | ICD-10-CM | POA: Diagnosis not present

## 2021-11-28 LAB — CUP PACEART REMOTE DEVICE CHECK
Battery Remaining Longevity: 28 mo
Battery Remaining Percentage: 28 %
Battery Voltage: 2.83 V
Brady Statistic AP VP Percent: 1 %
Brady Statistic AP VS Percent: 34 %
Brady Statistic AS VP Percent: 1 %
Brady Statistic AS VS Percent: 66 %
Brady Statistic RA Percent Paced: 31 %
Brady Statistic RV Percent Paced: 1 %
Date Time Interrogation Session: 20230119020017
HighPow Impedance: 72 Ohm
HighPow Impedance: 72 Ohm
Implantable Lead Implant Date: 20151104
Implantable Lead Implant Date: 20151104
Implantable Lead Location: 753859
Implantable Lead Location: 753860
Implantable Lead Model: 5076
Implantable Pulse Generator Implant Date: 20151104
Lead Channel Impedance Value: 310 Ohm
Lead Channel Impedance Value: 350 Ohm
Lead Channel Pacing Threshold Amplitude: 0.5 V
Lead Channel Pacing Threshold Amplitude: 1 V
Lead Channel Pacing Threshold Pulse Width: 0.4 ms
Lead Channel Pacing Threshold Pulse Width: 0.4 ms
Lead Channel Sensing Intrinsic Amplitude: 3.1 mV
Lead Channel Sensing Intrinsic Amplitude: 6.8 mV
Lead Channel Setting Pacing Amplitude: 2 V
Lead Channel Setting Pacing Amplitude: 2.5 V
Lead Channel Setting Pacing Pulse Width: 0.4 ms
Lead Channel Setting Sensing Sensitivity: 0.5 mV
Pulse Gen Serial Number: 7179320

## 2021-12-11 NOTE — Progress Notes (Signed)
Remote ICD transmission.   

## 2021-12-18 ENCOUNTER — Other Ambulatory Visit (HOSPITAL_COMMUNITY): Payer: Self-pay | Admitting: Internal Medicine

## 2022-01-13 ENCOUNTER — Encounter: Payer: Self-pay | Admitting: Adult Health

## 2022-01-13 ENCOUNTER — Ambulatory Visit: Payer: 59 | Admitting: Adult Health

## 2022-01-13 VITALS — BP 116/73 | HR 62 | Ht 65.0 in | Wt 176.2 lb

## 2022-01-13 DIAGNOSIS — R42 Dizziness and giddiness: Secondary | ICD-10-CM | POA: Diagnosis not present

## 2022-01-13 DIAGNOSIS — G43009 Migraine without aura, not intractable, without status migrainosus: Secondary | ICD-10-CM

## 2022-01-13 MED ORDER — EMGALITY 120 MG/ML ~~LOC~~ SOAJ: 120.0000 mg | SUBCUTANEOUS | 11 refills | Status: DC

## 2022-01-13 NOTE — Patient Instructions (Signed)
Your Plan: ? ?Continue Emgality ?Continue Valium as needed for vertigo ?If your symptoms worsen or you develop new symptoms please let us know.  ? ?Thank you for coming to see [place] at [facility]. I hope we have been able to provide you high quality care today. ? ?You may receive a patient satisfaction survey over the next few weeks. We would appreciate your feedback and comments so that we may continue to improve ourselves and the health of our patients. ? ?

## 2022-01-13 NOTE — Progress Notes (Addendum)
PATIENT: Julie Saunders DOB: 02-21-1986  REASON FOR VISIT: follow up HISTORY FROM: patient  HISTORY OF PRESENT ILLNESS: Today 01/13/22:  Julie Saunders is a 36 year old female with a history of migraine headaches and vertigo.  Headaches continue to respond well to [facility].  Reports that she has not had a headache since 2020.  Reports that she occasionally has mild headaches but no migraines. patient states vertigo is only relieved with therapy.  She tries to do the exercises she learned from physical therapy she does admit that she may not do them as consistent as she should.  She only takes Valium for severe episodes usually associated with nausea.  01/10/21: Julie Saunders is a 36 year old female with a history of migraine headaches and vertigo.  She reports that her headaches have remained under good control with Emgality.  She states that she is not had a migraine since December 2020.  She has not had to use the sumatriptan.  At the last visit she was given a prescription of Valium to use for vertigo.  She reports that that she only has used 4 tablets.  She continues to get sometimes daily other times weekly episodes of vertigo.  She reports sometimes the vertigo is mild for the severe episode she has to use Valium.  She tries to do the exercises she learned from vestibular rehab but she does not do them consistently.  She returns today for evaluation.   07/12/20: Julie Saunders is a 36 year old with a history of migraine headaches and vertigo.  She returns today for she states that she has not had a headache since December 2020.  Reports that Emgality has been extremely beneficial for her.  She reports that her vertigo episode started to increase last month.  In the past she had a prescription of Valium from Dr. Alroy Dust.  She states that she did take a Valium and that was beneficial.  She is continue to try to do the vestibular exercises she learned in vestibular rehab however admits that she has not  been consistent with these exercises.  She returns today for an evaluation.  HISTORY Mrs Altemose reports good control of headaches with a lesser frequency , slightly less intensity and may be slightly shorter duration. Here for refills of Emgality- she keept a headache journal.  Thee were several month since may without any headaches !      Julie Saunders is a 36 year old female with a history of migraine headaches and vertigo.  She returns today for follow-up.  She states that her headaches have been controlled with Emgality.  She states that she may have 1-2 headaches a month.  She states that there is been some months that she did have a headache at all.  Her headache location varies.  She does report photophobia and phonophobia with her migraines.  She also has nausea but no vomiting.  Denies any visual changes.  Denies any numbness and weakness in the extremities.  She reports that she does use sumatriptan with good benefit.  She states that she still has vertigo.  This typically occurs with position changes.  She states that she will have some form of vertigo every day however the severity does vary.  She did complete vestibular rehab in the past.  She reports that she did get some benefit but not resolution.  She has not continue doing the exercises at home.  She states that she also was given Valium in the past and that  was beneficial as well.  She returns today for an evaluation.  REVIEW OF SYSTEMS: Out of a complete 14 system review of symptoms, the patient complains only of the following symptoms, and all other reviewed systems are negative.  See HPI  ALLERGIES: Allergies  Allergen Reactions   Sulfa Antibiotics Rash   Sulfacetamide Sodium Rash    HOME MEDICATIONS: Outpatient Medications Prior to Visit  Medication Sig Dispense Refill   acetaminophen (TYLENOL) 325 MG tablet Take 650 mg by mouth every 6 (six) hours as needed for headache.     budesonide (ENTOCORT EC) 3 MG 24 hr capsule Take  9 mg by mouth daily.     cetirizine (ZYRTEC) 10 MG tablet Take 10 mg by mouth daily.     Cholecalciferol (VITAMIN D3) 50 MCG (2000 UT) CHEW Chew by mouth daily.     Cobalamin Combinations (B-12) [phone] MCG SUBL Place 1 tablet under the tongue daily.     diazepam (VALIUM) 5 MG tablet Take 1 tablet (5 mg total) by mouth every 12 (twelve) hours as needed (vertigpo). 10 tablet 0   Galcanezumab-gnlm (EMGALITY) 120 MG/ML SOAJ Inject 120 mg into the skin every 30 (thirty) days. 1 mL 11   losartan (COZAAR) 25 MG tablet Take 0.5 tablets (12.5 mg total) by mouth daily. 90 tablet 3   ondansetron (ZOFRAN) 4 MG tablet Take 4 mg by mouth as needed for nausea or vomiting.     Pediatric Multiple Vit-C-FA (ANIMAL CHEWABLE MULTIVITAMIN PO) Take 1 tablet by mouth daily.     potassium chloride SA (KLOR-CON M20) 20 MEQ tablet Take 1 tablet (20 mEq total) by mouth daily. 90 tablet 3   predniSONE (DELTASONE) 5 MG tablet Patient takes 1 tablet daily for 1 week.     spironolactone (ALDACTONE) 25 MG tablet TAKE 1 TABLET (25 MG TOTAL) BY MOUTH DAILY. 30 tablet 3   thiamine (VITAMIN B-1) 100 MG tablet Take 300 mg by mouth daily.     ustekinumab (STELARA) 90 MG/ML SOSY injection Use as directed     No facility-administered medications prior to visit.    PAST MEDICAL HISTORY: Past Medical History:  Diagnosis Date   AICD (automatic cardioverter/defibrillator) present 09/14/2015   STJ dual chamber ICD implanted by Dr Caryl Comes for aborted cardiac arrest   Allergy    Bradycardia    Cardiac arrest ([place]) 08/31/2014   Crohn disease ([place])    Depression    History of blood transfusion 09/2012   "had allergic rxn to one of my Crohn's RX"    Migraine    "a few times/yr" (06/08/2015)   Pancytopenia ([place]) 11/23/2012   Status post radiofrequency ablation for arrhythmia, atrial tach 06/08/15 06/08/2015   SVT (supraventricular tachycardia) ([place]) 12/08/2014   likely an atrial tachycardia with CL 250 msec    PAST SURGICAL  HISTORY: Past Surgical History:  Procedure Laterality Date   ABLATION     ELECTROPHYSIOLOGIC STUDY N/A 06/08/2015   Procedure: SVT Ablation;  Surgeon: Thompson Grayer, MD;  Location: [facility];  Service: Cardiovascular;  Laterality: N/A;   ELECTROPHYSIOLOGY STUDY N/A 09/11/2014   Procedure: ELECTROPHYSIOLOGY STUDY;  Surgeon: Deboraha Sprang, MD;  [facility] C[facility];  Service: Cardiovascular;  Laterality: N/A;   IMPLANTABLE CARDIOVERTER DEFIBRILLATOR IMPLANT N/A 09/13/2014   Procedure: IMPLANTABLE CARDIOVERTER DEFIBRILLATOR IMPLANT;  Surgeon: Deboraha Sprang, MD;  Location: [facility] CATH LAB;  Service: Cardiovascular;  Laterality: N/A;; STJ dual chamber ICD implanted by Dr Caryl Comes for aborted cardiac arrest  FAMILY HISTORY: Family History  Problem Relation Age of Onset   Heart attack Mother    Migraines Mother    Heart disease Mother    Heart attack Maternal Grandmother    Heart disease Maternal Grandmother    Diabetes Maternal Grandfather    Asthma Paternal Grandmother    Diabetes Paternal Grandfather     SOCIAL HISTORY: Social History   Socioeconomic History   Marital status: Single    Spouse name: Not on file   Number of children: Not on file   Years of education: Not on file   Highest education level: Not on file  Occupational History   Not on file  Tobacco Use   Smoking status: Never   Smokeless tobacco: Never  Vaping Use   Vaping Use: Never used  Substance and Sexual Activity   Alcohol use: Yes    Comment: occasional/social   Drug use: No   Sexual activity: Never  Other Topics Concern   Not on file  Social History Narrative   Not on file   Social Determinants of Health   Financial Resource Strain: Not on file  Food Insecurity: Not on file  Transportation Needs: Not on file  Physical Activity: Not on file  Stress: Not on file  Social Connections: Not on file  Intimate Partner Violence: Not on file      PHYSICAL EXAM  Vitals:   01/13/22 1344  BP:  116/73  Pulse: 62  Weight: 176 lb 3.2 oz (79.9 kg)  Height: 5' 5"  (1.651 m)    Body mass index is 29.32 kg/m.  Generalized: Well developed, in no acute distress   Neurological examination  Mentation: Alert oriented to time, place, history taking. Follows all commands speech and language fluent Cranial nerve II-XII: Pupils were equal round reactive to light. Extraocular movements were full, visual field were full on confrontational test.  Horizontal End beat nystagmus noted. Head turning and shoulder shrug  were normal and symmetric. Motor: The motor testing reveals 5 over 5 strength of all 4 extremities. Good symmetric motor tone is noted throughout.  Sensory: Sensory testing is intact to soft touch on all 4 extremities. No evidence of extinction is noted.  Coordination: Cerebellar testing reveals good finger-nose-finger and heel-to-shin bilaterally.  Gait and station: Gait is normal.    DIAGNOSTIC DATA (LABS, IMAGING, TESTING) - I reviewed patient records, labs, notes, testing and imaging myself where available.  Lab Results  Component Value Date   WBC 10.5 09/22/2018   HGB 12.2 09/22/2018   HCT 37.1 09/22/2018   MCV 84 09/22/2018   PLT 402 09/22/2018      Component Value Date/Time   NA 142 09/22/2018 1532   NA 140 02/24/2013 1029   K 4.3 09/22/2018 1532   K 3.4 (L) 02/24/2013 1029   CL 104 09/22/2018 1532   CL 102 02/24/2013 1029   CO2 24 09/22/2018 1532   CO2 21 (L) 02/24/2013 1029   GLUCOSE 81 09/22/2018 1532   GLUCOSE 82 11/06/2016 1330   GLUCOSE 81 02/24/2013 1029   BUN 15 09/22/2018 1532   BUN 6.9 (L) 02/24/2013 1029   CREATININE 1.01 (H) 09/22/2018 1532   CREATININE 0.89 08/26/2016 1615   CREATININE 0.8 02/24/2013 1029   CALCIUM 9.0 09/22/2018 1532   CALCIUM 9.9 02/24/2013 1029   PROT 6.6 09/22/2018 1532   PROT 8.7 (H) 02/24/2013 1029   ALBUMIN 3.4 (L) 09/22/2018 1532   ALBUMIN 3.7 02/24/2013 1029   AST 9 09/22/2018 1532  AST 13 02/24/2013 1029   ALT  10 09/22/2018 1532   ALT 9 02/24/2013 1029   ALKPHOS 78 09/22/2018 1532   ALKPHOS 57 02/24/2013 1029   BILITOT <0.2 09/22/2018 1532   BILITOT 0.39 02/24/2013 1029   GFRNONAA 74 09/22/2018 1532   GFRAA 86 09/22/2018 1532    Lab Results  Component Value Date   TSH 3.772 08/21/2016      ASSESSMENT AND PLAN 36 y.o. year old female  has a past medical history of AICD (automatic cardioverter/defibrillator) present (09/14/2015), Allergy, Bradycardia, Cardiac arrest ([place]) (08/31/2014), Crohn disease ([place]), Depression, History of blood transfusion (09/2012), Migraine, Pancytopenia ([place]) (11/23/2012), Status post radiofrequency ablation for arrhythmia, atrial tach 06/08/15 (06/08/2015), and SVT (supraventricular tachycardia) ([place]) (12/08/2014). here with:  Migraine headaches  Continue Emgality Advised to let us know if her headache frequency increases  Vertigo  Continue exercises learned at vestibular rehab Take Valium for severe episodes  She will follow-up in 1 year or sooner if needed     Ward Givens, MSN, NP-C 01/13/2022, 1:41 PM [facility] [address], [place], [place] [address] [phone]

## 2022-02-06 HISTORY — PX: OTHER SURGICAL HISTORY: SHX169

## 2022-02-27 ENCOUNTER — Ambulatory Visit (INDEPENDENT_AMBULATORY_CARE_PROVIDER_SITE_OTHER): Payer: 59

## 2022-02-27 DIAGNOSIS — I5022 Chronic systolic (congestive) heart failure: Secondary | ICD-10-CM

## 2022-02-27 DIAGNOSIS — I428 Other cardiomyopathies: Secondary | ICD-10-CM | POA: Diagnosis not present

## 2022-02-28 LAB — CUP PACEART REMOTE DEVICE CHECK
Battery Remaining Longevity: 25 mo
Battery Remaining Percentage: 24 %
Battery Voltage: 2.8 V
Brady Statistic AP VP Percent: 1 %
Brady Statistic AP VS Percent: 34 %
Brady Statistic AS VP Percent: 1 %
Brady Statistic AS VS Percent: 65 %
Brady Statistic RA Percent Paced: 31 %
Brady Statistic RV Percent Paced: 1 %
Date Time Interrogation Session: 20230420174650
HighPow Impedance: 74 Ohm
HighPow Impedance: 74 Ohm
Implantable Lead Implant Date: 20151104
Implantable Lead Implant Date: 20151104
Implantable Lead Location: 753859
Implantable Lead Location: 753860
Implantable Lead Model: 5076
Implantable Pulse Generator Implant Date: 20151104
Lead Channel Impedance Value: 310 Ohm
Lead Channel Impedance Value: 380 Ohm
Lead Channel Pacing Threshold Amplitude: 0.5 V
Lead Channel Pacing Threshold Amplitude: 1 V
Lead Channel Pacing Threshold Pulse Width: 0.4 ms
Lead Channel Pacing Threshold Pulse Width: 0.4 ms
Lead Channel Sensing Intrinsic Amplitude: 3.3 mV
Lead Channel Sensing Intrinsic Amplitude: 7.3 mV
Lead Channel Setting Pacing Amplitude: 2 V
Lead Channel Setting Pacing Amplitude: 2.5 V
Lead Channel Setting Pacing Pulse Width: 0.4 ms
Lead Channel Setting Sensing Sensitivity: 0.5 mV
Pulse Gen Serial Number: 7179320

## 2022-03-17 NOTE — Progress Notes (Signed)
Remote ICD transmission.   

## 2022-04-10 ENCOUNTER — Other Ambulatory Visit: Payer: Self-pay | Admitting: *Deleted

## 2022-04-10 MED ORDER — DIAZEPAM 5 MG PO TABS: 5.0000 mg | ORAL_TABLET | Freq: Two times a day (BID) | ORAL | 0 refills | Status: DC | PRN

## 2022-05-14 ENCOUNTER — Other Ambulatory Visit (HOSPITAL_COMMUNITY): Payer: Self-pay | Admitting: Internal Medicine

## 2022-05-23 ENCOUNTER — Encounter: Payer: Self-pay | Admitting: Adult Health

## 2022-05-26 NOTE — Telephone Encounter (Signed)
Julie Saunders,   I have never wrote disability for vertigo. How often are you having episodes and are you doing this exercises regularly?

## 2022-05-28 NOTE — Telephone Encounter (Signed)
Received notes from Dr. Benjamine Mola giving to POD 4 to review

## 2022-05-29 ENCOUNTER — Ambulatory Visit (INDEPENDENT_AMBULATORY_CARE_PROVIDER_SITE_OTHER): Payer: 59

## 2022-05-29 DIAGNOSIS — I428 Other cardiomyopathies: Secondary | ICD-10-CM | POA: Diagnosis not present

## 2022-05-29 LAB — CUP PACEART REMOTE DEVICE CHECK
Battery Remaining Longevity: 20 mo
Battery Remaining Percentage: 22 %
Battery Voltage: 2.78 V
Brady Statistic AP VP Percent: 1 %
Brady Statistic AP VS Percent: 40 %
Brady Statistic AS VP Percent: 1 %
Brady Statistic AS VS Percent: 58 %
Brady Statistic RA Percent Paced: 36 %
Brady Statistic RV Percent Paced: 1 %
Date Time Interrogation Session: 20230720020017
HighPow Impedance: 63 Ohm
HighPow Impedance: 63 Ohm
Implantable Lead Implant Date: 20151104
Implantable Lead Implant Date: 20151104
Implantable Lead Location: 753859
Implantable Lead Location: 753860
Implantable Lead Model: 5076
Implantable Pulse Generator Implant Date: 20151104
Lead Channel Impedance Value: 310 Ohm
Lead Channel Impedance Value: 430 Ohm
Lead Channel Pacing Threshold Amplitude: 0.5 V
Lead Channel Pacing Threshold Amplitude: 1 V
Lead Channel Pacing Threshold Pulse Width: 0.4 ms
Lead Channel Pacing Threshold Pulse Width: 0.4 ms
Lead Channel Sensing Intrinsic Amplitude: 3.1 mV
Lead Channel Sensing Intrinsic Amplitude: 7.1 mV
Lead Channel Setting Pacing Amplitude: 2 V
Lead Channel Setting Pacing Amplitude: 2.5 V
Lead Channel Setting Pacing Pulse Width: 0.4 ms
Lead Channel Setting Sensing Sensitivity: 0.5 mV
Pulse Gen Serial Number: 7179320

## 2022-05-29 NOTE — Telephone Encounter (Signed)
I read over paperwork. I think it would be appropriate  if Dr. Benjamine Mola evaluated whether disability paperwork is warranted since he completed testing.

## 2022-06-08 NOTE — Telephone Encounter (Signed)
I will leave the final decision to Dr. Brett Fairy. Please have her review.

## 2022-06-12 NOTE — Telephone Encounter (Addendum)
Spoke with Dr Brett Fairy and reviewed chart. Per Dr Brett Fairy, we do not consider vertigo a permanent disability. If therapy worked for patient, happy to refer again. However we will not be able to complete this disability ppw.

## 2022-06-20 NOTE — Progress Notes (Signed)
Remote ICD transmission.   

## 2022-08-28 ENCOUNTER — Ambulatory Visit (INDEPENDENT_AMBULATORY_CARE_PROVIDER_SITE_OTHER): Payer: 59

## 2022-08-28 DIAGNOSIS — I428 Other cardiomyopathies: Secondary | ICD-10-CM

## 2022-08-28 LAB — CUP PACEART REMOTE DEVICE CHECK
Battery Remaining Longevity: 18 mo
Battery Remaining Percentage: 19 %
Battery Voltage: 2.77 V
Brady Statistic AP VP Percent: 1 %
Brady Statistic AP VS Percent: 41 %
Brady Statistic AS VP Percent: 1 %
Brady Statistic AS VS Percent: 57 %
Brady Statistic RA Percent Paced: 37 %
Brady Statistic RV Percent Paced: 1 %
Date Time Interrogation Session: 20231019020017
HighPow Impedance: 81 Ohm
HighPow Impedance: 81 Ohm
Implantable Lead Implant Date: 20151104
Implantable Lead Implant Date: 20151104
Implantable Lead Location: 753859
Implantable Lead Location: 753860
Implantable Lead Model: 5076
Implantable Pulse Generator Implant Date: 20151104
Lead Channel Impedance Value: 310 Ohm
Lead Channel Impedance Value: 360 Ohm
Lead Channel Pacing Threshold Amplitude: 0.5 V
Lead Channel Pacing Threshold Amplitude: 1 V
Lead Channel Pacing Threshold Pulse Width: 0.4 ms
Lead Channel Pacing Threshold Pulse Width: 0.4 ms
Lead Channel Sensing Intrinsic Amplitude: 3.1 mV
Lead Channel Sensing Intrinsic Amplitude: 7 mV
Lead Channel Setting Pacing Amplitude: 2 V
Lead Channel Setting Pacing Amplitude: 2.5 V
Lead Channel Setting Pacing Pulse Width: 0.4 ms
Lead Channel Setting Sensing Sensitivity: 0.5 mV
Pulse Gen Serial Number: 7179320

## 2022-09-01 ENCOUNTER — Other Ambulatory Visit (HOSPITAL_COMMUNITY): Payer: Self-pay | Admitting: Internal Medicine

## 2022-09-09 NOTE — Progress Notes (Signed)
Remote ICD transmission.   

## 2022-09-11 ENCOUNTER — Encounter: Payer: 59 | Admitting: Internal Medicine

## 2022-09-21 ENCOUNTER — Encounter: Payer: Self-pay | Admitting: Adult Health

## 2022-09-22 MED ORDER — DIAZEPAM 5 MG PO TABS: 5.0000 mg | ORAL_TABLET | Freq: Two times a day (BID) | ORAL | 0 refills | Status: DC | PRN

## 2022-09-22 NOTE — Telephone Encounter (Addendum)
Last visit: 01/13/2022 Next visit: 01/15/2023  Per [place] registry, last filled #10/30 on 04/10/2022. Rx refill sent to [facility].

## 2022-09-26 ENCOUNTER — Encounter: Payer: Self-pay | Admitting: Internal Medicine

## 2022-09-26 ENCOUNTER — Ambulatory Visit: Payer: 59 | Attending: Internal Medicine | Admitting: Internal Medicine

## 2022-09-26 VITALS — BP 130/90 | HR 62 | Ht 65.0 in | Wt 179.6 lb

## 2022-09-26 DIAGNOSIS — I471 Supraventricular tachycardia, unspecified: Secondary | ICD-10-CM

## 2022-09-26 DIAGNOSIS — I429 Cardiomyopathy, unspecified: Secondary | ICD-10-CM | POA: Diagnosis not present

## 2022-09-26 DIAGNOSIS — I469 Cardiac arrest, cause unspecified: Secondary | ICD-10-CM

## 2022-09-26 DIAGNOSIS — Z95810 Presence of automatic (implantable) cardiac defibrillator: Secondary | ICD-10-CM

## 2022-09-26 MED ORDER — LOSARTAN POTASSIUM 25 MG PO TABS: 25.0000 mg | ORAL_TABLET | Freq: Every day | ORAL | 3 refills | Status: DC

## 2022-09-26 NOTE — Progress Notes (Signed)
Patient Care Team: Alroy Dust, L.Marlou Sa, MD as PCP - General (Family Medicine) Clementeen Graham, MD as Referring Physician (Gastroenterology) Bensimhon, Shaune Pascal, MD as Consulting Physician (Cardiology) Deboraha Sprang, MD as Consulting Physician (Cardiology) Raylene Miyamoto, MD as Referring Physician (Otolaryngology)   HPI  Julie Saunders is a 35 y.o. female Seen in follow-up for aborted cardiac arrest 2015.  No specific cause was identified. She was treated with beta blockers initially but these were stopped because of hypotension and dizziness. During cardiac rehabilitation she had an episode with ICD discharges.    She has subsequently undergone EPS RFCA  And these records were reveiewed>>ectopic tachycardia successful  Recurrent palps >> egrams suggest PVC/AVNRT or Atrial Tach with 1 AVB  Has been following with CHF clinic repeat echo pending  Lightheadedness, not orthostatic.  Has seen neurology thinks it is "nerve damage "potentially related to her cardiac arrest.  No heat intolerance.   Crohn's has been active.  Underwent partial small bowel resection with a primary anastomosis.  Repeat colonoscopy recently was good.  Has anal fistulae  are reviewed and followed at [facility].    No chest pain, exercise tolerance stable  no palps or edema    She reminds me that her mother, only child,died suddenly at 74+ yo "heart attach" no autopsy ? Familial hypokalemia ??      DATE TEST EF   11/15 MYOVIEW 52% No ischemia/infarct  12/17 Echo 55-65%   12/21 Echo  35-40%   11/22 Echo  55%       Dizziness now for months, initially 24/7 now somewhat improved; undergoing neuro eval   Date Cr K Hgb TSH  1/16      1.053  10/17 1.04 2.8  1.69   10/18 1.01 4.0    8/19 1.08 3.8    6/20 1.13 4.5 12.3   +9/22 1.2 4.0 13.1          Reviewed family history, mother died suddenly 2 months before her daughter's cardiac arrest.  Somehow this association was missed.  I reviewed her mother's home  records, nothing really is informative.  She had a history of mitral valve prolapse apparently according to the daughter no other cardiac history.  No echo is available.            Alb   Past Medical History:  Diagnosis Date   AICD (automatic cardioverter/defibrillator) present 09/14/2015   STJ dual chamber ICD implanted by Dr Caryl Comes for aborted cardiac arrest   Allergy    Bradycardia    Cardiac arrest ([place]) 08/31/2014   Crohn disease ([place])    Depression    History of blood transfusion 09/2012   "had allergic rxn to one of my Crohn's RX"    Migraine    "a few times/yr" (06/08/2015)   Pancytopenia ([place]) 11/23/2012   Status post radiofrequency ablation for arrhythmia, atrial tach 06/08/15 06/08/2015   SVT (supraventricular tachycardia) 12/08/2014   likely an atrial tachycardia with CL 250 msec    Past Surgical History:  Procedure Laterality Date   ABLATION     ELECTROPHYSIOLOGIC STUDY N/A 06/08/2015   Procedure: SVT Ablation;  Surgeon: Thompson Grayer, MD;  Location: [facility];  Service: Cardiovascular;  Laterality: N/A;   ELECTROPHYSIOLOGY STUDY N/A 09/11/2014   Procedure: ELECTROPHYSIOLOGY STUDY;  Surgeon: Deboraha Sprang, MD;  Location: [facility] CATH LAB;  Service: Cardiovascular;  Laterality: N/A;   IMPLANTABLE CARDIOVERTER DEFIBRILLATOR IMPLANT N/A 09/13/2014   Procedure: IMPLANTABLE CARDIOVERTER  DEFIBRILLATOR IMPLANT;  Surgeon: Deboraha Sprang, MD;  Location: [facility] CATH LAB;  Service: Cardiovascular;  Laterality: N/A;; STJ dual chamber ICD implanted by Dr Caryl Comes for aborted cardiac arrest    Current Outpatient Medications  Medication Sig Dispense Refill   acetaminophen (TYLENOL) 325 MG tablet Take 650 mg by mouth every 6 (six) hours as needed for headache.     cetirizine (ZYRTEC) 10 MG tablet Take 10 mg by mouth daily.     Cholecalciferol (VITAMIN D3) 50 MCG (2000 UT) CHEW Chew by mouth daily.     Cobalamin Combinations (B-12) [phone] MCG SUBL Place 1 tablet under the tongue daily.      colestipol (COLESTID) 1 g tablet Take 1 g by mouth as needed.     diazepam (VALIUM) 5 MG tablet Take 1 tablet (5 mg total) by mouth every 12 (twelve) hours as needed (vertigpo). 10 tablet 0   Galcanezumab-gnlm (EMGALITY) 120 MG/ML SOAJ Inject 120 mg into the skin every 30 (thirty) days. 1 mL 11   losartan (COZAAR) 25 MG tablet TAKE 1/2 TABLET BY MOUTH EVERY DAY 45 tablet 3   ondansetron (ZOFRAN) 4 MG tablet Take 4 mg by mouth as needed for nausea or vomiting.     Pediatric Multiple Vit-C-FA (ANIMAL CHEWABLE MULTIVITAMIN PO) Take 1 tablet by mouth daily.     potassium chloride SA (KLOR-CON M20) 20 MEQ tablet Take 1 tablet (20 mEq total) by mouth daily. 90 tablet 3   SKYRIZI 360 MG/2.4ML SOCT Inject into the skin every 8 (eight) weeks.     spironolactone (ALDACTONE) 25 MG tablet TAKE 1 TABLET (25 MG TOTAL) BY MOUTH DAILY. 90 tablet 1   triamcinolone cream (KENALOG) 0.1 % Apply 1 Application topically 2 (two) times daily as needed.     ustekinumab (STELARA) 90 MG/ML SOSY injection Use as directed     No current facility-administered medications for this visit.    Allergies  Allergen Reactions   Sulfa Antibiotics Rash   Sulfacetamide Sodium Rash    Review of Systems negative except from HPI and PMH  Physical Exam BP (!) 130/90   Pulse 62   Ht 5' 5"  (1.651 m)   Wt 179 lb 9.6 oz (81.5 kg)   SpO2 98%   BMI 29.89 kg/m  Well developed and well nourished in no acute distress HENT normal Neck supple with JVP-flat Clear Device pocket well healed; without hematoma or erythema.  There is no tethering  Regular rate and rhythm, no  murmur Abd-soft with active BS No Clubbing cyanosis  edema Skin-warm and dry A & Oriented  Grossly normal sensory and motor function  ECG sinus at 62 14/09/38  Device function is normal. Programming changes none  See Paceart for details     Assessment and  Plan  Aborted cardiac arrest  ICD St Jude     Cardiomyopathy-progressive  Supraventricular  tachycardia  status post RFCA-JA  Crohn's disease  Dizziness  Rightward axis new  Hypertension  Iron deficiency anemia   Resolved cardiomyopathy, now with elevated blood pressure.  We will increase her losartan from 12.5--25 mg a day and continue the spironolactone at 25 mg a day.  No interval arrhythmias.  Device function is normal.  No interval arrhythmias.

## 2022-09-26 NOTE — Patient Instructions (Addendum)
Medication Instructions:  Your physician has recommended you make the following change in your medication:  1) INCREASE losartan to 25 mg daily  *If you need a refill on your cardiac medications before your next appointment, please call your pharmacy*  Follow-Up: At [facility], you and your health needs are our priority.  As part of our continuing mission to provide you with exceptional heart care, we have created designated Provider Care Teams.  These Care Teams include your primary Cardiologist (physician) and Advanced Practice Providers (APPs -  Physician Assistants and Nurse Practitioners) who all work together to provide you with the care you need, when you need it.  Your next appointment:   1 year(s)  The format for your next appointment:   In Person  Provider:   You may see Virl Axe, MD or one of the following Advanced Practice Providers on your designated Care Team:   Tommye Standard, [place] Legrand Como "Jonni Sanger" Chalmers Cater, [place]    Important Information About Sugar

## 2022-10-28 ENCOUNTER — Other Ambulatory Visit: Payer: Self-pay | Admitting: *Deleted

## 2022-10-28 DIAGNOSIS — I428 Other cardiomyopathies: Secondary | ICD-10-CM

## 2022-10-29 ENCOUNTER — Telehealth: Payer: Self-pay | Admitting: Internal Medicine

## 2022-10-29 NOTE — Telephone Encounter (Signed)
Pt c/o medication issue:  1. Name of Medication: Losartan  2. How are you currently taking this medication (dosage and times per day)?   3. Are you having a reaction (difficulty breathing--STAT)?   4. What is your medication issue? Patient says she will run out of medicine on 11-11-22. Her insurance will not pay it again until 11-28-22. She wants to know to knoww what to do for all the days, that she does not have the medicine?

## 2022-10-29 NOTE — Telephone Encounter (Signed)
Spoke with pt and advised new Losartan Rx was sent to pharmacy 09/26/2022.  [facility].  Pt verbalizes understanding and agrees with current plan.    Order Providers  Prescribing Provider Encounter Provider  Deboraha Sprang, MD Deboraha Sprang, MD   Outpatient Medication Detail   Disp Refills Start End   losartan (COZAAR) 25 MG tablet 90 tablet 3 09/26/2022    Sig - Route: Take 1 tablet (25 mg total) by mouth daily. - Oral   Sent to pharmacy as: losartan (COZAAR) 25 MG tablet   E-Prescribing Status: Receipt confirmed by pharmacy (09/26/2022  4:31 PM EST)    [facility]  [id number] [place] [address]

## 2022-10-30 ENCOUNTER — Encounter (HOSPITAL_COMMUNITY): Payer: Self-pay | Admitting: Internal Medicine

## 2022-10-30 ENCOUNTER — Ambulatory Visit (HOSPITAL_COMMUNITY)
Admission: RE | Admit: 2022-10-30 | Discharge: 2022-10-30 | Disposition: A | Discharge status: Home / Self Care | Payer: 59 | Source: Ambulatory Visit | Attending: Family Medicine | Admitting: Family Medicine

## 2022-10-30 ENCOUNTER — Ambulatory Visit (HOSPITAL_BASED_OUTPATIENT_CLINIC_OR_DEPARTMENT_OTHER)
Admission: RE | Admit: 2022-10-30 | Discharge: 2022-10-30 | Disposition: A | Discharge status: Home / Self Care | Payer: 59 | Source: Ambulatory Visit | Attending: Internal Medicine | Admitting: Internal Medicine

## 2022-10-30 VITALS — BP 122/80 | HR 50 | Wt 179.2 lb

## 2022-10-30 DIAGNOSIS — I471 Supraventricular tachycardia, unspecified: Secondary | ICD-10-CM | POA: Diagnosis not present

## 2022-10-30 DIAGNOSIS — Z95810 Presence of automatic (implantable) cardiac defibrillator: Secondary | ICD-10-CM | POA: Diagnosis not present

## 2022-10-30 DIAGNOSIS — K5090 Crohn's disease, unspecified, without complications: Secondary | ICD-10-CM | POA: Insufficient documentation

## 2022-10-30 DIAGNOSIS — I5022 Chronic systolic (congestive) heart failure: Secondary | ICD-10-CM | POA: Insufficient documentation

## 2022-10-30 DIAGNOSIS — I472 Ventricular tachycardia, unspecified: Secondary | ICD-10-CM

## 2022-10-30 DIAGNOSIS — Z9049 Acquired absence of other specified parts of digestive tract: Secondary | ICD-10-CM | POA: Insufficient documentation

## 2022-10-30 DIAGNOSIS — K50919 Crohn's disease, unspecified, with unspecified complications: Secondary | ICD-10-CM

## 2022-10-30 DIAGNOSIS — Z79899 Other long term (current) drug therapy: Secondary | ICD-10-CM | POA: Diagnosis not present

## 2022-10-30 DIAGNOSIS — I428 Other cardiomyopathies: Secondary | ICD-10-CM | POA: Diagnosis not present

## 2022-10-30 LAB — CBC
HCT: 39.5 % (ref 36.0–46.0)
Hemoglobin: 13 g/dL (ref 12.0–15.0)
MCH: 28.6 pg (ref 26.0–34.0)
MCHC: 32.9 g/dL (ref 30.0–36.0)
MCV: 86.8 fL (ref 80.0–100.0)
Platelets: 315 10*3/uL (ref 150–400)
RBC: 4.55 MIL/uL (ref 3.87–5.11)
RDW: 12.5 % (ref 11.5–15.5)
WBC: 10.4 10*3/uL (ref 4.0–10.5)
nRBC: 0 % (ref 0.0–0.2)

## 2022-10-30 LAB — COMPREHENSIVE METABOLIC PANEL
ALT: 11 U/L (ref 0–44)
AST: 16 U/L (ref 15–41)
Albumin: 3.6 g/dL (ref 3.5–5.0)
Alkaline Phosphatase: 53 U/L (ref 38–126)
Anion gap: 8 (ref 5–15)
BUN: 15 mg/dL (ref 6–20)
CO2: 24 mmol/L (ref 22–32)
Calcium: 9.1 mg/dL (ref 8.9–10.3)
Chloride: 104 mmol/L (ref 98–111)
Creatinine, Ser: 1.08 mg/dL — ABNORMAL HIGH (ref 0.44–1.00)
GFR, Estimated: >60 mL/min (ref >60)
Glucose, Bld: 93 mg/dL (ref 70–99)
Potassium: 3.3 mmol/L — ABNORMAL LOW (ref 3.5–5.1)
Sodium: 136 mmol/L (ref 135–145)
Total Bilirubin: 0.4 mg/dL (ref 0.3–1.2)
Total Protein: 7.5 g/dL (ref 6.5–8.1)

## 2022-10-30 LAB — ECHOCARDIOGRAM COMPLETE
Area-P 1/2: 3.45 cm2
S' Lateral: 3.9 cm

## 2022-10-30 MED ORDER — EMPAGLIFLOZIN 10 MG PO TABS: 10.0000 mg | ORAL_TABLET | Freq: Every day | ORAL | 11 refills | Status: DC

## 2022-10-30 NOTE — Patient Instructions (Signed)
START Jardiance 10 mg one tab daily   Labs today We will only contact you if something comes back abnormal or we need to make some changes. Otherwise no news is good news!  Your physician recommends that you schedule a follow-up appointment in: 4 months  in the Advanced Practitioners (PA/NP) Clinic    Do the following things EVERYDAY: Weigh yourself in the morning before breakfast. Write it down and keep it in a log. Take your medicines as prescribed Eat low salt foods--Limit salt (sodium) to 2000 mg per day.  Stay as active as you can everyday Limit all fluids for the day to less than 2 liters  At the [facility], you and your health needs are our priority. As part of our continuing mission to provide you with exceptional heart care, we have created designated Provider Care Teams. These Care Teams include your primary Cardiologist (physician) and Advanced Practice Providers (APPs- Physician Assistants and Nurse Practitioners) who all work together to provide you with the care you need, when you need it.   You may see any of the following providers on your designated Care Team at your next follow up: Dr Glori Bickers Dr Loralie Champagne Dr. Roxana Hires, NP Lyda Jester, [place] [facility] [place], [place] Forestine Na, NP Audry Riles, PharmD   Please be sure to bring in all your medications bottles to every appointment.   If you have any questions or concerns before your next appointment please send [place] a message through [place] or call our office at [phone].    TO LEAVE A MESSAGE FOR THE NURSE SELECT OPTION 2, PLEASE LEAVE A MESSAGE INCLUDING: YOUR NAME DATE OF BIRTH CALL BACK NUMBER REASON FOR CALL**this is important as we prioritize the call backs  YOU WILL RECEIVE A CALL BACK THE SAME DAY AS LONG AS YOU CALL BEFORE 4:00 PM

## 2022-10-30 NOTE — Progress Notes (Signed)
ADVANCED HF CLINIC NOTE  Referring Physician: Alroy Dust, L. [place] Primary Care: Alroy Dust, L. [place] Primary Cardiologist: Virl Axe  HPI:  36 yo female with PMH of Crohn's disease, migraine headaches, Chronic Systolic CHF, SVT s/p ablation 06/08/15, Vtach, presumed long QT, s/p ICD implant. Sees Neurology for vertigo and headaches.  Initially she presented to [facility] on 08/31/14 after collapse at home in the setting of prolonged diarrhea 2/2 crohn's disease.  Arrived with K of 2.6. She was unresponsive for approx. 10-15 minutes before fire department arrived and initiated CPR for 15 minutes until EMS arrived and initiated ACLS with CPR for 15 more minutes prior to ROSC.  Pt was defibrillated 4x for vfib, and received 4 rounds of epinephrine, 150 mg amiodarone, narcan, D50, and was intubated.  10/24- rewarmed, followed commands.  She had an ECHO 08/31/14 with EF 25-30% which was thought to be secondary to her arrest, given that her repeat on 09/08/14 EF improved to 60%. Post cardiac arrest she developed sinus bradycardia and ATN.  Because of her acute kidney injury secondary to the arrest she could not undergo cardiac catheterization.  09/11/14 had epinephrine infusion observation with rare ectopic beats and stable QT at 460mec.  Also had flecainide brugada challenge which was negative.  Stress Myoview on 09/12/14 showed no evidence of prior infarct or ischemia, mild diffuse global hypokinesis, EF 52%.  She underwent dual chamber St Jude  ICD on 09/11/14.    12/08/2014 defibrillator discharged and she went to the ED interrogation revealed episode of polymorphic Vtach followed by SVT/atrial tach initial rate 240. EP consulted and restarted her propanolol (was stopped at outpatient visit due to dizziness) and to follow up with Dr. KCaryl Comes  Dr. KCaryl Comesincreased propanolol to 120 mg and performed SVT ablation 06/08/15.    08/2016 came to her outpatient visit with Dr. KCaryl Comesand was severely hypotensive found to  have lost 24 additional pounds related to Crohn's. No further SVT or Vtach noted.  Propanolol d/ced.  [facility] to [facility] weight loss at that time started on steroids, TPN.    [facility] the [facility] few years, no real trouble with arrhythmias ECHO studies stable normal EF until 11/06/2020 where her EF dropped down to 35-40%.  Coreg 3.125 started but GDMT limited by dizziness, orthostasis and low bp.  Initially started BID then decreased to at bedtime.    We saw her for the first time in 5/22.  EF 45-50% and than symptoms related more to deconditioning than HF. Referred to our exercise physiologist who was coaching her. Participated in PT earlier this year. Discharged d/t lack of progress.   Echo 11/22 EF 55%  Here for f/u. Feeling pretty good. [facility] Dr. KCaryl Comesin 11/23 and losartan increased from 12.5 -> 25 daily. Also on spiro 25 daily. No edema, orthopnea or PND. Feels like overall she is doing better.   Echo today 10/30/22 EF 45%    FH:  Some Asthma in family, Grandmother, Uncle  Mother with MVP, MI at age 35  Past Medical History:  Diagnosis Date   AICD (automatic cardioverter/defibrillator) present 09/14/2015   STJ dual chamber ICD implanted by Dr KCaryl Comesfor aborted cardiac arrest   Allergy    Bradycardia    Cardiac arrest ([place] 08/31/2014   Crohn disease ([place]    Depression    History of blood transfusion 09/2012   "  had allergic rxn to one of my Crohn's RX"    Migraine    "a few times/yr" (06/08/2015)   Pancytopenia ([place]) 11/23/2012   Status post radiofrequency ablation for arrhythmia, atrial tach 06/08/15 06/08/2015   SVT (supraventricular tachycardia) 12/08/2014   likely an atrial tachycardia with CL 250 msec    Current Outpatient Medications  Medication Sig Dispense Refill   acetaminophen (TYLENOL) 325 MG tablet Take 650 mg by mouth every 6 (six) hours as needed for  headache.     cetirizine (ZYRTEC) 10 MG tablet Take 10 mg by mouth daily.     Cholecalciferol (VITAMIN D3) 50 MCG (2000 UT) CHEW Chew by mouth daily.     Cobalamin Combinations (B-12) [phone] MCG SUBL Place 1 tablet under the tongue daily.     colestipol (COLESTID) 1 g tablet Take 1 g by mouth as needed.     diazepam (VALIUM) 5 MG tablet Take 1 tablet (5 mg total) by mouth every 12 (twelve) hours as needed (vertigpo). 10 tablet 0   Galcanezumab-gnlm (EMGALITY) 120 MG/ML SOAJ Inject 120 mg into the skin every 30 (thirty) days. 1 mL 11   losartan (COZAAR) 25 MG tablet Take 1 tablet (25 mg total) by mouth daily. 90 tablet 3   ondansetron (ZOFRAN) 4 MG tablet Take 4 mg by mouth as needed for nausea or vomiting.     Pediatric Multiple Vit-C-FA (ANIMAL CHEWABLE MULTIVITAMIN PO) Take 1 tablet by mouth daily.     potassium chloride SA (KLOR-CON M20) 20 MEQ tablet Take 1 tablet (20 mEq total) by mouth daily. 90 tablet 3   SKYRIZI 360 MG/2.4ML SOCT Inject into the skin every 8 (eight) weeks.     spironolactone (ALDACTONE) 25 MG tablet TAKE 1 TABLET (25 MG TOTAL) BY MOUTH DAILY. 90 tablet 1   triamcinolone cream (KENALOG) 0.1 % Apply 1 Application topically 2 (two) times daily as needed.     No current facility-administered medications for this encounter.    Allergies  Allergen Reactions   Sulfa Antibiotics Rash   Sulfacetamide Sodium Rash      Social History   Socioeconomic History   Marital status: Single    Spouse name: Not on file   Number of children: Not on file   Years of education: Not on file   Highest education level: Not on file  Occupational History   Not on file  Tobacco Use   Smoking status: Never   Smokeless tobacco: Never  Vaping Use   Vaping Use: Never used  Substance and Sexual Activity   Alcohol use: Yes    Comment: occasional/social   Drug use: No   Sexual activity: Never  Other Topics Concern   Not on file  Social History Narrative   Not on file   Social  Determinants of Health   Financial Resource Strain: Not on file  Food Insecurity: Not on file  Transportation Needs: Not on file  Physical Activity: Not on file  Stress: Not on file  Social Connections: Not on file  Intimate Partner Violence: Not on file      Family History  Problem Relation Age of Onset   Heart attack Mother    Migraines Mother    Heart disease Mother    Heart attack Maternal Grandmother    Heart disease Maternal Grandmother    Diabetes Maternal Grandfather    Asthma Paternal Grandmother    Diabetes Paternal Grandfather     Vitals:   10/30/22 1515  BP: 122/80  Pulse: (!) 50  SpO2: 99%  Weight: 81.3 kg (179 lb 3.2 oz)     General:  Well appearing. No resp difficulty HEENT: normal Neck: supple. no JVD. Carotids 2+ bilat; no bruits. No lymphadenopathy or thryomegaly appreciated. Cor: PMI nondisplaced. Regular rate & rhythm. No rubs, gallops or murmurs. Lungs: clear Abdomen: soft, nontender, nondistended. No hepatosplenomegaly. No bruits or masses. Good bowel sounds. Extremities: no cyanosis, clubbing, rash, edema Neuro: alert & orientedx3, cranial nerves grossly intact. moves all 4 extremities w/o difficulty. Affect pleasant  ECG today: Sinus brady 58 Personally reviewed   ASSESSMENT & PLAN:  1. 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 Jude ICD implanted 09/13/14 - Subsequent ECHO studies with normal EF althout 2020 low normal at 50-55%  - Echo 11/06/2020 EF read as  35-40% - EF from 12/21 Likely ~45% if not 45-50% with normal diastolic function - Echo 95/18 EF 55% - Echo today 10/30/22 EF 45% - Volume appears stable. Not requiring diuretics - Continue spiro 12.5 qhs -  Continue losartan 25 qhs - Not able to tolerate coreg and propanolol in the past d/t dizziness and orthostasis - Will try Jardiance 10 watch closely for volume depletion in setting of Crohn's - ICD followed by Dr. Caryl Comes  2. Crohn's Disease: -now followed by [facility] - s/p bowel resection at [facility] in 3/23 -on stelara, budesonide and recently initiated prednisone taper  3. H/o SVT: - felt to be atrial tach had an ablation 06/08/15 - in NSR now  - ECG looks good   Glori Bickers, MD  3:55 PM

## 2022-10-30 NOTE — Progress Notes (Signed)
Medication Samples have been provided to the patient.  Drug name: Jardiance       Strength: 10 mg        Qty: 2  LOT: [id number]  Exp.Date: 01/2025  Dosing instructions: Take 1 tablet daily  The patient has been instructed regarding the correct time, dose, and frequency of taking this medication, including desired effects and most common side effects.   Julie Saunders 4:07 PM 10/30/2022

## 2022-10-30 NOTE — Addendum Note (Signed)
Encounter addended by: Kerry Dory, CMA on: 10/30/2022 4:09 PM  Actions taken: Diagnosis association updated, Order list changed, Clinical Note Signed

## 2022-10-30 NOTE — Progress Notes (Signed)
  Echocardiogram 2D Echocardiogram has been performed.  Julie Saunders 10/30/2022, 2:52 PM

## 2022-11-18 ENCOUNTER — Telehealth (HOSPITAL_COMMUNITY): Payer: Self-pay

## 2022-11-18 NOTE — Telephone Encounter (Signed)
Patient called wanting you to know that since starting jardiance her dizziness has gotten worse.

## 2022-11-18 NOTE — Telephone Encounter (Signed)
Patient advised and verbalized understanding. Med list updated to reflect changes.  ? ?

## 2022-11-18 NOTE — Addendum Note (Signed)
Addended by: Shela Nevin R on: 04/13/9431 03:27 PM   Modules accepted: Orders

## 2022-11-27 ENCOUNTER — Ambulatory Visit: Payer: 59 | Attending: Internal Medicine

## 2022-11-27 DIAGNOSIS — I428 Other cardiomyopathies: Secondary | ICD-10-CM

## 2022-11-27 LAB — CUP PACEART REMOTE DEVICE CHECK
Battery Remaining Longevity: 14 mo
Battery Remaining Percentage: 16 %
Battery Voltage: 2.74 V
Brady Statistic AP VP Percent: 1 %
Brady Statistic AP VS Percent: 46 %
Brady Statistic AS VP Percent: 1 %
Brady Statistic AS VS Percent: 52 %
Brady Statistic RA Percent Paced: 42 %
Brady Statistic RV Percent Paced: 1 %
Date Time Interrogation Session: 20240118031236
HighPow Impedance: 72 Ohm
HighPow Impedance: 72 Ohm
Implantable Lead Connection Status: 753985
Implantable Lead Connection Status: 753985
Implantable Lead Implant Date: 20151104
Implantable Lead Implant Date: 20151104
Implantable Lead Location: 753859
Implantable Lead Location: 753860
Implantable Lead Model: 5076
Implantable Pulse Generator Implant Date: 20151104
Lead Channel Impedance Value: 310 Ohm
Lead Channel Impedance Value: 380 Ohm
Lead Channel Pacing Threshold Amplitude: 0.5 V
Lead Channel Pacing Threshold Amplitude: 1 V
Lead Channel Pacing Threshold Pulse Width: 0.4 ms
Lead Channel Pacing Threshold Pulse Width: 0.4 ms
Lead Channel Sensing Intrinsic Amplitude: 3.4 mV
Lead Channel Sensing Intrinsic Amplitude: 8 mV
Lead Channel Setting Pacing Amplitude: 2 V
Lead Channel Setting Pacing Amplitude: 2.5 V
Lead Channel Setting Pacing Pulse Width: 0.4 ms
Lead Channel Setting Sensing Sensitivity: 0.5 mV
Pulse Gen Serial Number: 7179320

## 2022-12-18 NOTE — Progress Notes (Signed)
Remote ICD transmission.   

## 2023-01-04 ENCOUNTER — Other Ambulatory Visit (HOSPITAL_COMMUNITY): Payer: Self-pay | Admitting: Internal Medicine

## 2023-01-04 ENCOUNTER — Other Ambulatory Visit: Payer: Self-pay | Admitting: Internal Medicine

## 2023-01-15 ENCOUNTER — Encounter: Payer: Self-pay | Admitting: Adult Health

## 2023-01-15 ENCOUNTER — Ambulatory Visit: Payer: 59 | Admitting: Adult Health

## 2023-01-15 VITALS — BP 132/80 | HR 65 | Ht 65.0 in | Wt 179.0 lb

## 2023-01-15 DIAGNOSIS — G43009 Migraine without aura, not intractable, without status migrainosus: Secondary | ICD-10-CM | POA: Diagnosis not present

## 2023-01-15 DIAGNOSIS — R42 Dizziness and giddiness: Secondary | ICD-10-CM

## 2023-01-15 MED ORDER — EMGALITY 120 MG/ML ~~LOC~~ SOAJ: 120.0000 mg | SUBCUTANEOUS | 11 refills | Status: DC

## 2023-01-15 NOTE — Progress Notes (Signed)
PATIENT: Julie Saunders DOB: Oct 02, 1986  REASON FOR VISIT: follow up HISTORY FROM: patient  HISTORY OF PRESENT ILLNESS: Today 01/15/23:  Julie Saunders is a 37 y.o. female with a history of Migraine Headaches and vertigo. Returns today for follow-up. Remains on Emgality for her migraines.  She states so far this year she has not had a true migraine.  Emgality seems to be working well.  She continues to have vertigo.  Every day she has some form of vertigo.  When she has a severe episode she will have to use the Valium.  Her last refill Valium was in November for 10 tablets.  It is clear that she is not using it very often and only for severe episodes.   Has been to ENT in the past and saw Dr. Benjamine Mola. He believes that she has central and peripheral vertigo.  Recommended vestibular rehab  01/13/22: Julie Saunders is a 37 year old female with a history of migraine headaches and vertigo.  Headaches continue to respond well to [facility].  Reports that she has not had a headache since 2020.  Reports that she occasionally has mild headaches but no migraines. patient states vertigo is only relieved with therapy.  She tries to do the exercises she learned from physical therapy she does admit that she may not do them as consistent as she should.  She only takes Valium for severe episodes usually associated with nausea.  01/10/21: Julie Saunders is a 37 year old female with a history of migraine headaches and vertigo.  She reports that her headaches have remained under good control with Emgality.  She states that she is not had a migraine since December 2020.  She has not had to use the sumatriptan.  At the last visit she was given a prescription of Valium to use for vertigo.  She reports that that she only has used 4 tablets.  She continues to get sometimes daily other times weekly episodes of vertigo.  She reports sometimes the vertigo is mild for the severe episode she has to use Valium.  She tries to do the  exercises she learned from vestibular rehab but she does not do them consistently.  She returns today for evaluation.   07/12/20: Julie Saunders is a 37 year old with a history of migraine headaches and vertigo.  She returns today for she states that she has not had a headache since December 2020.  Reports that Emgality has been extremely beneficial for her.  She reports that her vertigo episode started to increase last month.  In the past she had a prescription of Valium from Dr. Alroy Dust.  She states that she did take a Valium and that was beneficial.  She is continue to try to do the vestibular exercises she learned in vestibular rehab however admits that she has not been consistent with these exercises.  She returns today for an evaluation.  HISTORY Mrs Callery reports good control of headaches with a lesser frequency , slightly less intensity and may be slightly shorter duration. Here for refills of Emgality- she keept a headache journal.  Thee were several month since may without any headaches !      Julie Saunders is a 37 year old female with a history of migraine headaches and vertigo.  She returns today for follow-up.  She states that her headaches have been controlled with Emgality.  She states that she may have 1-2 headaches a month.  She states that there is been some months that she  did have a headache at all.  Her headache location varies.  She does report photophobia and phonophobia with her migraines.  She also has nausea but no vomiting.  Denies any visual changes.  Denies any numbness and weakness in the extremities.  She reports that she does use sumatriptan with good benefit.  She states that she still has vertigo.  This typically occurs with position changes.  She states that she will have some form of vertigo every day however the severity does vary.  She did complete vestibular rehab in the past.  She reports that she did get some benefit but not resolution.  She has not continue doing the  exercises at home.  She states that she also was given Valium in the past and that was beneficial as well.  She returns today for an evaluation.  REVIEW OF SYSTEMS: Out of a complete 14 system review of symptoms, the patient complains only of the following symptoms, and all other reviewed systems are negative.  See HPI  ALLERGIES: Allergies  Allergen Reactions   Sulfa Antibiotics Rash   Sulfacetamide Sodium Rash    HOME MEDICATIONS: Outpatient Medications Prior to Visit  Medication Sig Dispense Refill   acetaminophen (TYLENOL) 325 MG tablet Take 650 mg by mouth every 6 (six) hours as needed for headache.     Budesonide 2 MG FOAM daily.     cetirizine (ZYRTEC) 10 MG tablet Take 10 mg by mouth daily.     Cholecalciferol (VITAMIN D3) 50 MCG (2000 UT) CHEW Chew by mouth daily.     Cobalamin Combinations (B-12) [phone] MCG SUBL Place 1 tablet under the tongue daily.     colestipol (COLESTID) 1 g tablet Take 1 g by mouth as needed.     diazepam (VALIUM) 5 MG tablet Take 1 tablet (5 mg total) by mouth every 12 (twelve) hours as needed (vertigpo). 10 tablet 0   Ferrous Sulfate (SLOW RELEASE IRON PO) Take 45 mg by mouth daily.     Galcanezumab-gnlm (EMGALITY) 120 MG/ML SOAJ Inject 120 mg into the skin every 30 (thirty) days. 1 mL 11   losartan (COZAAR) 25 MG tablet Take 1 tablet (25 mg total) by mouth daily. 90 tablet 3   ondansetron (ZOFRAN) 4 MG tablet Take 4 mg by mouth as needed for nausea or vomiting.     Pediatric Multiple Vit-C-FA (ANIMAL CHEWABLE MULTIVITAMIN PO) Take 1 tablet by mouth daily.     potassium chloride SA (KLOR-CON M20) 20 MEQ tablet TAKE 1 TABLET BY MOUTH EVERY DAY 90 tablet 2   SKYRIZI 360 MG/2.4ML SOCT Inject into the skin every 8 (eight) weeks.     spironolactone (ALDACTONE) 25 MG tablet TAKE 1 TABLET (25 MG TOTAL) BY MOUTH DAILY. 90 tablet 1   triamcinolone cream (KENALOG) 0.1 % Apply 1 Application topically 2 (two) times daily as needed.     No  facility-administered medications prior to visit.    PAST MEDICAL HISTORY: Past Medical History:  Diagnosis Date   AICD (automatic cardioverter/defibrillator) present 09/14/2015   STJ dual chamber ICD implanted by Dr Caryl Comes for aborted cardiac arrest   Allergy    Bradycardia    Cardiac arrest ([place]) 08/31/2014   Crohn disease ([place])    Depression    History of blood transfusion 09/2012   "had allergic rxn to one of my Crohn's RX"    Migraine    "a few times/yr" (06/08/2015)   Pancytopenia ([place]) 11/23/2012   Status post radiofrequency ablation for arrhythmia,  atrial tach 06/08/15 06/08/2015   SVT (supraventricular tachycardia) 12/08/2014   likely an atrial tachycardia with CL 250 msec    PAST SURGICAL HISTORY: Past Surgical History:  Procedure Laterality Date   : LAPAROSCOPY, SURGICAL; COLECTOMY, PARTIAL, WITH REMOVAL OF TERMINAL ILEUM WITH ILEOCOLOSTOMY 02/06/2022  02/06/2022   ABLATION     ELECTROPHYSIOLOGIC STUDY N/A 06/08/2015   Procedure: SVT Ablation;  Surgeon: Thompson Grayer, MD;  Location: [place] [facility];  Service: Cardiovascular;  Laterality: N/A;   ELECTROPHYSIOLOGY STUDY N/A 09/11/2014   Procedure: ELECTROPHYSIOLOGY STUDY;  Surgeon: Deboraha Sprang, MD;  Location: [facility] CATH LAB;  Service: Cardiovascular;  Laterality: N/A;   IMPLANTABLE CARDIOVERTER DEFIBRILLATOR IMPLANT N/A 09/13/2014   Procedure: IMPLANTABLE CARDIOVERTER DEFIBRILLATOR IMPLANT;  Surgeon: Deboraha Sprang, MD;  Location: [facility] CATH LAB;  Service: Cardiovascular;  Laterality: N/A;; STJ dual chamber ICD implanted by Dr Caryl Comes for aborted cardiac arrest    FAMILY HISTORY: Family History  Problem Relation Age of Onset   Heart attack Mother    Migraines Mother    Heart disease Mother    Heart attack Maternal Grandmother    Heart disease Maternal Grandmother    Diabetes Maternal Grandfather    Asthma Paternal Grandmother    Diabetes Paternal Grandfather     SOCIAL HISTORY: Social History   Socioeconomic History    Marital status: Single    Spouse name: Not on file   Number of children: Not on file   Years of education: Not on file   Highest education level: Not on file  Occupational History   Not on file  Tobacco Use   Smoking status: Never   Smokeless tobacco: Never  Vaping Use   Vaping Use: Never used  Substance and Sexual Activity   Alcohol use: Yes    Comment: occasional/social   Drug use: No   Sexual activity: Never  Other Topics Concern   Not on file  Social History Narrative   Caffeine: sodas 2/day, some tea    Social Determinants of Health   Financial Resource Strain: Not on file  Food Insecurity: Not on file  Transportation Needs: Not on file  Physical Activity: Not on file  Stress: Not on file  Social Connections: Not on file  Intimate Partner Violence: Not on file      PHYSICAL EXAM  Vitals:   01/15/23 1326  BP: 132/80  Pulse: 65  Weight: 179 lb (81.2 kg)  Height: '5\' 5"'$  (1.651 m)    Body mass index is 29.79 kg/m.  Generalized: Well developed, in no acute distress   Neurological examination  Mentation: Alert oriented to time, place, history taking. Follows all commands speech and language fluent Cranial nerve II-XII: Pupils were equal round reactive to light. Extraocular movements were full, visual field were full on confrontational test.  Horizontal End beat nystagmus noted. Head turning and shoulder shrug  were normal and symmetric. Motor: The motor testing reveals 5 over 5 strength of all 4 extremities. Good symmetric motor tone is noted throughout.  Sensory: Sensory testing is intact to soft touch on all 4 extremities. No evidence of extinction is noted.  Coordination: Cerebellar testing reveals good finger-nose-finger and heel-to-shin bilaterally.  Gait and station: Gait is normal.    DIAGNOSTIC DATA (LABS, IMAGING, TESTING) - I reviewed patient records, labs, notes, testing and imaging myself where available.  Lab Results  Component Value Date    WBC 10.4 10/30/2022   HGB 13.0 10/30/2022   HCT 39.5 10/30/2022   MCV  86.8 10/30/2022   PLT 315 10/30/2022      Component Value Date/Time   NA 136 10/30/2022 1611   NA 142 09/22/2018 1532   NA 140 02/24/2013 1029   K 3.3 (L) 10/30/2022 1611   K 3.4 (L) 02/24/2013 1029   CL 104 10/30/2022 1611   CL 102 02/24/2013 1029   CO2 24 10/30/2022 1611   CO2 21 (L) 02/24/2013 1029   GLUCOSE 93 10/30/2022 1611   GLUCOSE 81 02/24/2013 1029   BUN 15 10/30/2022 1611   BUN 15 09/22/2018 1532   BUN 6.9 (L) 02/24/2013 1029   CREATININE 1.08 (H) 10/30/2022 1611   CREATININE 0.89 08/26/2016 1615   CREATININE 0.8 02/24/2013 1029   CALCIUM 9.1 10/30/2022 1611   CALCIUM 9.9 02/24/2013 1029   PROT 7.5 10/30/2022 1611   PROT 6.6 09/22/2018 1532   PROT 8.7 (H) 02/24/2013 1029   ALBUMIN 3.6 10/30/2022 1611   ALBUMIN 3.4 (L) 09/22/2018 1532   ALBUMIN 3.7 02/24/2013 1029   AST 16 10/30/2022 1611   AST 13 02/24/2013 1029   ALT 11 10/30/2022 1611   ALT 9 02/24/2013 1029   ALKPHOS 53 10/30/2022 1611   ALKPHOS 57 02/24/2013 1029   BILITOT 0.4 10/30/2022 1611   BILITOT <0.2 09/22/2018 1532   BILITOT 0.39 02/24/2013 1029   GFRNONAA >60 10/30/2022 1611   GFRAA 86 09/22/2018 1532    Lab Results  Component Value Date   TSH 3.772 08/21/2016      ASSESSMENT AND PLAN 37 y.o. year old female  has a past medical history of AICD (automatic cardioverter/defibrillator) present (09/14/2015), Allergy, Bradycardia, Cardiac arrest ([place]) (08/31/2014), Crohn disease ([place]), Depression, History of blood transfusion (09/2012), Migraine, Pancytopenia ([place]) (11/23/2012), Status post radiofrequency ablation for arrhythmia, atrial tach 06/08/15 (06/08/2015), and SVT (supraventricular tachycardia) (12/08/2014). here with:  Migraine headaches  Continue Emgality  Vertigo  Continue exercises learned at vestibular rehab Take Valium for severe episodes  She will follow-up in 1 year or sooner if needed     Ward Givens, MSN, NP-C 01/15/2023, 1:41 PM [facility] [address], [place], [place] [address] [phone]

## 2023-02-26 ENCOUNTER — Ambulatory Visit (INDEPENDENT_AMBULATORY_CARE_PROVIDER_SITE_OTHER): Payer: 59

## 2023-02-26 DIAGNOSIS — I428 Other cardiomyopathies: Secondary | ICD-10-CM | POA: Diagnosis not present

## 2023-02-26 LAB — CUP PACEART REMOTE DEVICE CHECK
Battery Remaining Longevity: 12 mo
Battery Remaining Percentage: 13 %
Battery Voltage: 2.71 V
Brady Statistic AP VP Percent: 1 %
Brady Statistic AP VS Percent: 48 %
Brady Statistic AS VP Percent: 1 %
Brady Statistic AS VS Percent: 51 %
Brady Statistic RA Percent Paced: 44 %
Brady Statistic RV Percent Paced: 1 %
Date Time Interrogation Session: 20240418020017
HighPow Impedance: 78 Ohm
HighPow Impedance: 78 Ohm
Implantable Lead Connection Status: 753985
Implantable Lead Connection Status: 753985
Implantable Lead Implant Date: 20151104
Implantable Lead Implant Date: 20151104
Implantable Lead Location: 753859
Implantable Lead Location: 753860
Implantable Lead Model: 5076
Implantable Pulse Generator Implant Date: 20151104
Lead Channel Impedance Value: 310 Ohm
Lead Channel Impedance Value: 400 Ohm
Lead Channel Pacing Threshold Amplitude: 0.5 V
Lead Channel Pacing Threshold Amplitude: 1 V
Lead Channel Pacing Threshold Pulse Width: 0.4 ms
Lead Channel Pacing Threshold Pulse Width: 0.4 ms
Lead Channel Sensing Intrinsic Amplitude: 3.6 mV
Lead Channel Sensing Intrinsic Amplitude: 7.3 mV
Lead Channel Setting Pacing Amplitude: 2 V
Lead Channel Setting Pacing Amplitude: 2.5 V
Lead Channel Setting Pacing Pulse Width: 0.4 ms
Lead Channel Setting Sensing Sensitivity: 0.5 mV
Pulse Gen Serial Number: 7179320

## 2023-02-27 NOTE — Progress Notes (Incomplete)
[facility]  PCP: Clovis Riley, L.August Saucer, MD EP: Sherryl Manges HF Cardiologist: Dr. Gala Romney  HPI: 37 y.o. female with PMH of Crohn's disease, migraine headaches, Chronic Systolic CHF, SVT s/p ablation 06/08/15, Vtach, presumed long QT, s/p ICD implant. Sees Neurology for vertigo and headaches.  Initially she presented to [facility] ER on 08/31/14 after collapse at home in the setting of prolonged diarrhea 2/2 crohn's disease.  Arrived with K of 2.6. She was unresponsive for approx. 10-15 minutes before fire department arrived and initiated CPR for 15 minutes until EMS arrived and initiated ACLS with CPR for 15 more minutes prior to ROSC.  Pt was defibrillated 4x for vfib, and received 4 rounds of epinephrine, 150 mg amiodarone, narcan, D50, and was intubated.  10/24- rewarmed, followed commands.  She had an ECHO 08/31/14 with EF 25-30% which was thought to be secondary to her arrest, given that her repeat on 09/08/14 EF improved to 60%. Post cardiac arrest she developed sinus bradycardia and ATN.  Because of her acute kidney injury secondary to the arrest she could not undergo cardiac catheterization.  09/11/14 had epinephrine infusion observation with rare ectopic beats and stable QT at .  Also had flecainide brugada challenge which was negative.  Stress Myoview on 09/12/14 showed no evidence of prior infarct or ischemia, mild diffuse global hypokinesis, EF 52%.  She underwent dual chamber St Jude  ICD on 09/11/14.    12/08/2014 defibrillator discharged and she went to the ED interrogation revealed episode of polymorphic Vtach followed by SVT/atrial tach initial rate 240. EP consulted and restarted her propanolol (was stopped at outpatient visit due to dizziness) and to follow up with Dr. Graciela Husbands.  Dr. Graciela Husbands increased propanolol to 120 mg and performed SVT ablation 06/08/15.    08/2016 came to her outpatient visit with Dr. Graciela Husbands and was severely hypotensive found to have lost 24 additional  pounds related to Crohn's. No further SVT or Vtach noted.  Propanolol d/ced.  Admitted for failure to thrive 10/2016 at [facility] up to 50lb weight loss at that time started on steroids, TPN.    Crohn's regimen modified by [facility], doing okay for the next few years, no real trouble with arrhythmias ECHO studies stable normal EF until 11/06/2020 where her EF dropped down to 35-40%.  Coreg 3.125 started but GDMT limited by dizziness, orthostasis and low bp.  Initially started BID then decreased to at bedtime.    We saw her for the first time in 5/22.  EF 45-50% and than symptoms related more to deconditioning than HF. Referred to our exercise physiologist who was coaching her. Participated in PT earlier this year. Discharged d/t lack of progress.   Echo 11/22 EF 55%  Had bowel resection in 3/23 at [facility]. Saw Dr. Graciela Husbands in 11/23 and losartan increased from 12.5 -> 25 daily.   Echo 12/23: EF 45%  Today she returns for HF follow up. Overall feeling fine. She is not SOB walking on flat ground or going up steps. She is more SOB in heat/humidity. She feels occasional palpitations, especially after she has caffeine. Chronically dizzy, has vertigo and sees Neurology. Denies CP, edema, or PND/Orthopnea. Appetite ok. No fever or chills. She does not weigh at home. Taking all medications. 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  FH:  Some Asthma in family, Grandmother, Uncle  Mother with MVP, MI at age 44  Past Medical History:  Diagnosis Date   AICD (automatic cardioverter/defibrillator) present 09/14/2015   STJ dual  chamber ICD implanted by Dr Graciela Husbands for aborted cardiac arrest   Allergy    Bradycardia    Cardiac arrest ([facility]) 08/31/2014   Crohn disease ([facility])    Depression    History of blood transfusion 09/2012   "had allergic rxn to one of my Crohn's RX"    Migraine    "a few times/yr" (06/08/2015)   Pancytopenia ([facility]) 11/23/2012   Status post radiofrequency ablation for arrhythmia, atrial tach  06/08/15 06/08/2015   SVT (supraventricular tachycardia) 12/08/2014   likely an atrial tachycardia with CL 250 msec    Current Outpatient Medications  Medication Sig Dispense Refill   acetaminophen (TYLENOL) 325 MG tablet Take 650 mg by mouth every 6 (six) hours as needed for headache.     cetirizine (ZYRTEC) 10 MG tablet Take 10 mg by mouth daily.     Cholecalciferol (VITAMIN D3) 50 MCG (2000 UT) CHEW Chew by mouth daily.     Cobalamin Combinations (B-12) [phone] MCG SUBL Place 1 tablet under the tongue daily.     colestipol (COLESTID) 1 g tablet Take 1 g by mouth as needed.     diazepam (VALIUM) 5 MG tablet Take 1 tablet (5 mg total) by mouth every 12 (twelve) hours as needed (vertigpo). 10 tablet 0   Ferrous Sulfate (SLOW RELEASE IRON PO) Take 45 mg by mouth daily.     Galcanezumab-gnlm (EMGALITY) 120 MG/ML SOAJ Inject 120 mg into the skin every 30 (thirty) days. 1 mL 11   losartan (COZAAR) 25 MG tablet Take 1 tablet (25 mg total) by mouth daily. 90 tablet 3   ondansetron (ZOFRAN) 4 MG tablet Take 4 mg by mouth as needed for nausea or vomiting.     Pediatric Multiple Vit-C-FA (ANIMAL CHEWABLE MULTIVITAMIN PO) Take 1 tablet by mouth daily.     potassium chloride SA (KLOR-CON M20) 20 MEQ tablet TAKE 1 TABLET BY MOUTH EVERY DAY 90 tablet 2   SKYRIZI 360 MG/2.4ML SOCT Inject into the skin every 8 (eight) weeks.     spironolactone (ALDACTONE) 25 MG tablet TAKE 1 TABLET (25 MG TOTAL) BY MOUTH DAILY. 90 tablet 1   triamcinolone cream (KENALOG) 0.1 % Apply 1 Application topically 2 (two) times daily as needed.     No current facility-administered medications for this encounter.   Allergies  Allergen Reactions   Sulfa Antibiotics Rash   Sulfacetamide Sodium Rash   Social History   Socioeconomic History   Marital status: Single    Spouse name: Not on file   Number of children: Not on file   Years of education: Not on file   Highest education level: Not on file  Occupational History    Not on file  Tobacco Use   Smoking status: Never   Smokeless tobacco: Never  Vaping Use   Vaping Use: Never used  Substance and Sexual Activity   Alcohol use: Yes    Comment: occasional/social   Drug use: No   Sexual activity: Never  Other Topics Concern   Not on file  Social History Narrative   Caffeine: sodas 2/day, some tea    Social Determinants of Health   Financial Resource Strain: Not on file  Food Insecurity: Not on file  Transportation Needs: Not on file  Physical Activity: Not on file  Stress: Not on file  Social Connections: Not on file  Intimate Partner Violence: Not on file   Family History  Problem Relation Age of Onset   Heart attack Mother  Migraines Mother    Heart disease Mother    Heart attack Maternal Grandmother    Heart disease Maternal Grandmother    Diabetes Maternal Grandfather    Asthma Paternal Grandmother    Diabetes Paternal Grandfather    BP 120/82   Pulse (!) 55   Wt 83.5 kg (184 lb)   SpO2 97%   BMI 30.62 kg/m   Wt Readings from Last 3 Encounters:  03/02/23 83.5 kg (184 lb)  01/15/23 81.2 kg (179 lb)  10/30/22 81.3 kg (179 lb 3.2 oz)   Physical Exam General:  NAD. No resp difficulty, walked into clinic HEENT: Normal Neck: Supple. No JVD. Carotids 2+ bilat; no bruits. No lymphadenopathy or thryomegaly appreciated. Cor: PMI nondisplaced. Regular rate & rhythm. No rubs, gallops or murmurs. Lungs: Clear Abdomen: Soft, nontender, nondistended. No hepatosplenomegaly. No bruits or masses. Good bowel sounds. Extremities: No cyanosis, clubbing, rash, edema Neuro: Alert & oriented x 3, cranial nerves grossly intact. Moves all 4 extremities w/o difficulty. Affect pleasant.  ECG (personally reviewed): SB 58 bpm  Device interrogation (personally reviewed from 02/26/23): CorVue stable, but volume slowly creeping up, no AF or VT.  ASSESSMENT & PLAN: 1. 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 - Repeat echo (09/08/14): EF improved to 60%  - Could not undergo LHC due to ATN from arrest, she had dual chamber St. Jude ICD implanted 09/13/14 - Subsequent echo studies with normal EF through 2020 low normal at 50-55%  - Echo 11/06/2020 EF read as 35-40% - EF from 12/21 Likely ~45% if not 45-50% with normal diastolic function - Echo (11/22): EF 55% - Echo (12/23): EF 45% - NYHA II. Volume appears stable. Not requiring diuretics. GDMT limited by chronic dizziness. - Stop losartan. - Start Entresto 24/26 mg bid. We discussed rationale for switch. If dizzy with this change, OK to go back to losartan 25 mg qhs. - Continue spiro 25 mg qhs. - Not able to tolerate coreg and propanolol in the past d/t dizziness and orthostasis - Unable to tolerate Jardiance due to dizziness. - ICD followed by Dr. Graciela Husbands, interrogation as above. - Recent labs from 02/26/23 reviewed, K 3.8, SCr 0.92 - BMET in 2 weeks.  2. Crohn's Disease: - Now followed by [facility] - s/p bowel resection at [facility] in 3/23. - on stelara, budesonide and recently initiated prednisone taper  3. H/o SVT: - felt to be atrial tach had an ablation 06/08/15 - SB on ECG today.  Follow up in 4 months with Dr. Gala Romney.  Anderson [place], FNP  03/02/23 3:31 PM

## 2023-03-02 ENCOUNTER — Ambulatory Visit (HOSPITAL_COMMUNITY)
Admission: RE | Admit: 2023-03-02 | Discharge: 2023-03-02 | Disposition: A | Discharge status: Home / Self Care | Payer: 59 | Source: Ambulatory Visit | Attending: Family Medicine | Admitting: Family Medicine

## 2023-03-02 ENCOUNTER — Encounter (HOSPITAL_COMMUNITY): Payer: Self-pay

## 2023-03-02 VITALS — BP 120/82 | HR 55 | Wt 184.0 lb

## 2023-03-02 DIAGNOSIS — I4901 Ventricular fibrillation: Secondary | ICD-10-CM | POA: Insufficient documentation

## 2023-03-02 DIAGNOSIS — I472 Ventricular tachycardia, unspecified: Secondary | ICD-10-CM | POA: Diagnosis not present

## 2023-03-02 DIAGNOSIS — R42 Dizziness and giddiness: Secondary | ICD-10-CM | POA: Insufficient documentation

## 2023-03-02 DIAGNOSIS — K50919 Crohn's disease, unspecified, with unspecified complications: Secondary | ICD-10-CM | POA: Diagnosis not present

## 2023-03-02 DIAGNOSIS — R0602 Shortness of breath: Secondary | ICD-10-CM | POA: Insufficient documentation

## 2023-03-02 DIAGNOSIS — Z95810 Presence of automatic (implantable) cardiac defibrillator: Secondary | ICD-10-CM | POA: Diagnosis not present

## 2023-03-02 DIAGNOSIS — I5022 Chronic systolic (congestive) heart failure: Secondary | ICD-10-CM | POA: Insufficient documentation

## 2023-03-02 DIAGNOSIS — K5090 Crohn's disease, unspecified, without complications: Secondary | ICD-10-CM | POA: Insufficient documentation

## 2023-03-02 DIAGNOSIS — Z8679 Personal history of other diseases of the circulatory system: Secondary | ICD-10-CM | POA: Diagnosis not present

## 2023-03-02 DIAGNOSIS — Z79899 Other long term (current) drug therapy: Secondary | ICD-10-CM | POA: Diagnosis not present

## 2023-03-02 DIAGNOSIS — I428 Other cardiomyopathies: Secondary | ICD-10-CM | POA: Insufficient documentation

## 2023-03-02 DIAGNOSIS — R519 Headache, unspecified: Secondary | ICD-10-CM | POA: Insufficient documentation

## 2023-03-02 DIAGNOSIS — R002 Palpitations: Secondary | ICD-10-CM | POA: Diagnosis present

## 2023-03-02 DIAGNOSIS — I471 Supraventricular tachycardia, unspecified: Secondary | ICD-10-CM | POA: Insufficient documentation

## 2023-03-02 MED ORDER — ENTRESTO 24-26 MG PO TABS
1.0000 | ORAL_TABLET | Freq: Two times a day (BID) | ORAL | 11 refills | Status: DC
Start: 2023-03-02 — End: 2023-03-06

## 2023-03-02 NOTE — Patient Instructions (Addendum)
Stop Losartan Start Entresto 24/26 mg twice daily - 30 day free coupon provided. If you get dizzy on new medication, it is ok to stop Entresto and go back on Losartan 25 mg every evening. Repeat labs in 2 weeks. Return to see Dr. Gala Romney in 4 months. Please call us at [phone] in July to schedule this appointment. IF any issues or questions prior to next appointment, please call us at [phone].

## 2023-03-06 ENCOUNTER — Telehealth (HOSPITAL_COMMUNITY): Payer: Self-pay | Admitting: Cardiology

## 2023-03-06 MED ORDER — LOSARTAN POTASSIUM 25 MG PO TABS: 25.0000 mg | ORAL_TABLET | Freq: Every day | ORAL | 3 refills | Status: DC

## 2023-03-06 NOTE — Telephone Encounter (Signed)
Patient called to report dizziness, HA, and nausea with entresto Will stop today and restart losartan 25 mg daily  Med list updated message to provider as fyi Labs cancelled

## 2023-03-19 ENCOUNTER — Other Ambulatory Visit (HOSPITAL_COMMUNITY): Payer: 59

## 2023-03-30 NOTE — Progress Notes (Signed)
Remote ICD transmission.   

## 2023-04-06 ENCOUNTER — Other Ambulatory Visit: Payer: Self-pay | Admitting: Adult Health

## 2023-04-08 NOTE — Telephone Encounter (Signed)
Last visit 01/15/23 Next visit 01/19/24  Per [place] registry:   Rx refill sent to [facility]

## 2023-05-28 ENCOUNTER — Ambulatory Visit (INDEPENDENT_AMBULATORY_CARE_PROVIDER_SITE_OTHER): Payer: 59

## 2023-05-28 DIAGNOSIS — I428 Other cardiomyopathies: Secondary | ICD-10-CM

## 2023-05-28 LAB — CUP PACEART REMOTE DEVICE CHECK
Battery Remaining Longevity: 10 mo
Battery Remaining Percentage: 10 %
Battery Voltage: 2.68 V
Brady Statistic AP VP Percent: 1 %
Brady Statistic AP VS Percent: 44 %
Brady Statistic AS VP Percent: 1 %
Brady Statistic AS VS Percent: 55 %
Brady Statistic RA Percent Paced: 40 %
Brady Statistic RV Percent Paced: 1 %
Date Time Interrogation Session: 20240718020016
HighPow Impedance: 78 Ohm
HighPow Impedance: 78 Ohm
Implantable Lead Connection Status: 753985
Implantable Lead Connection Status: 753985
Implantable Lead Implant Date: 20151104
Implantable Lead Implant Date: 20151104
Implantable Lead Location: 753859
Implantable Lead Location: 753860
Implantable Lead Model: 5076
Implantable Pulse Generator Implant Date: 20151104
Lead Channel Impedance Value: 340 Ohm
Lead Channel Impedance Value: 480 Ohm
Lead Channel Pacing Threshold Amplitude: 0.5 V
Lead Channel Pacing Threshold Amplitude: 1 V
Lead Channel Pacing Threshold Pulse Width: 0.4 ms
Lead Channel Pacing Threshold Pulse Width: 0.4 ms
Lead Channel Sensing Intrinsic Amplitude: 3.7 mV
Lead Channel Sensing Intrinsic Amplitude: 7.9 mV
Lead Channel Setting Pacing Amplitude: 2 V
Lead Channel Setting Pacing Amplitude: 2.5 V
Lead Channel Setting Pacing Pulse Width: 0.4 ms
Lead Channel Setting Sensing Sensitivity: 0.5 mV
Pulse Gen Serial Number: 7179320

## 2023-06-11 NOTE — Progress Notes (Signed)
Remote ICD transmission.   

## 2023-07-04 ENCOUNTER — Ambulatory Visit (INDEPENDENT_AMBULATORY_CARE_PROVIDER_SITE_OTHER): Payer: 59

## 2023-07-04 ENCOUNTER — Ambulatory Visit (HOSPITAL_COMMUNITY)
Admission: EM | Admit: 2023-07-04 | Discharge: 2023-07-04 | Disposition: A | Discharge status: Home / Self Care | Payer: 59 | Source: Home / Self Care | Attending: Family Medicine | Admitting: Family Medicine

## 2023-07-04 ENCOUNTER — Encounter (HOSPITAL_COMMUNITY): Payer: Self-pay

## 2023-07-04 DIAGNOSIS — M79672 Pain in left foot: Secondary | ICD-10-CM

## 2023-07-04 MED ORDER — IBUPROFEN 800 MG PO TABS: 800.0000 mg | ORAL_TABLET | Freq: Three times a day (TID) | ORAL | 0 refills | Status: DC

## 2023-07-04 NOTE — ED Triage Notes (Signed)
Presents with R-foot toe pain pain after a slip and fall last night. Pt stated her L-foot is swollen.

## 2023-07-06 NOTE — ED Provider Notes (Signed)
[facility]   [id number] 07/04/23 Arrival Time: 1454  ASSESSMENT & PLAN:  1. Foot pain, left    I have personally viewed and independently interpreted the imaging studies ordered this visit. LEFT foot: no appreciable fractures.  Begin: Discharge Medication List as of 07/04/2023  6:11 PM     START taking these medications   Details  ibuprofen (ADVIL) 800 MG tablet Take 1 tablet (800 mg total) by mouth 3 (three) times daily with meals., Starting Sat 07/04/2023, Normal       Post-op shoe for comfort.  Work/school excuse note: not needed. Recommend:  Follow-up Information     [facility]).   Why: If worsening or failing to improve as anticipated. Contact information: [address] [place],  [place]  [address]  [phone]               Reviewed expectations re: course of current medical issues. Questions answered. Outlined signs and symptoms indicating need for more acute intervention. Patient verbalized understanding. After Visit Summary given.  SUBJECTIVE: History from: patient. Julie Saunders is a 37 y.o. female who reports Presents with L-foot toe pain pain after a slip and fall last night. Pt stated her L-foot is swollen. Great toe most painful. Is ambulatory. No extremity sensation changes or weakness.   Past Surgical History:  Procedure Laterality Date   : LAPAROSCOPY, SURGICAL; COLECTOMY, PARTIAL, WITH REMOVAL OF TERMINAL ILEUM WITH ILEOCOLOSTOMY 02/06/2022  02/06/2022   ABLATION     ELECTROPHYSIOLOGIC STUDY N/A 06/08/2015   Procedure: SVT Ablation;  Surgeon: Hillis Range, MD;  Location: [facility] INVASIVE CV LAB;  Service: Cardiovascular;  Laterality: N/A;   ELECTROPHYSIOLOGY STUDY N/A 09/11/2014   Procedure: ELECTROPHYSIOLOGY STUDY;  Surgeon: Duke Salvia, MD;  Location: [facility] CATH LAB;  Service: Cardiovascular;  Laterality: N/A;   IMPLANTABLE CARDIOVERTER DEFIBRILLATOR IMPLANT N/A 09/13/2014   Procedure: IMPLANTABLE  CARDIOVERTER DEFIBRILLATOR IMPLANT;  Surgeon: Duke Salvia, MD;  Location: [facility] CATH LAB;  Service: Cardiovascular;  Laterality: N/A;; STJ dual chamber ICD implanted by Dr Graciela Husbands for aborted cardiac arrest      OBJECTIVE:  Vitals:   07/04/23 1637  BP: (!) 144/71  Pulse: (!) 56  Resp: 16  Temp: 97.9 F (36.6 C)  TempSrc: Oral  SpO2: 98%    General appearance: alert; no distress HEENT: [place]; AT Neck: supple with FROM Resp: unlabored respirations Extremities: LLE: warm with well perfused appearance; bruising and TTP over great toe; with FROM; normal cap refill and distal sensation Psychological: alert and cooperative; normal mood and affect  Imaging: DG Foot Complete Left  Result Date: 07/04/2023 CLINICAL DATA:  Pain around great toe after a slip and fall last night. Swelling of the foot. EXAM: LEFT FOOT - COMPLETE 3+ VIEW COMPARISON:  None Available. FINDINGS: Dorsal soft tissue swelling along the forefoot. No fracture or acute bony findings. IMPRESSION: 1. Dorsal soft tissue swelling along the forefoot. No fracture or acute bony findings. Electronically Signed   By: Gaylyn Rong M.D.   On: 07/04/2023 17:54      Past Medical History:  Diagnosis Date   AICD (automatic cardioverter/defibrillator) present 09/14/2015   STJ dual chamber ICD implanted by Dr Graciela Husbands for aborted cardiac arrest   Allergy    Bradycardia    Cardiac arrest (HCC) 08/31/2014   Crohn disease (HCC)    Depression    History of blood transfusion 09/2012   "had allergic rxn to one of my Crohn's RX"  Migraine    "a few times/yr" (06/08/2015)   Pancytopenia (HCC) 11/23/2012   Status post radiofrequency ablation for arrhythmia, atrial tach 06/08/15 06/08/2015   SVT (supraventricular tachycardia) 12/08/2014   likely an atrial tachycardia with CL 250 msec   Social History   Socioeconomic History   Marital status: Single    Spouse name: Not on file   Number of children: Not on file   Years of education: Not  on file   Highest education level: Not on file  Occupational History   Not on file  Tobacco Use   Smoking status: Never   Smokeless tobacco: Never  Vaping Use   Vaping status: Never Used  Substance and Sexual Activity   Alcohol use: Yes    Comment: occasional/social   Drug use: No   Sexual activity: Never  Other Topics Concern   Not on file  Social History Narrative   Caffeine: sodas 2/day, some tea    Social Determinants of Health   Financial Resource Strain: Not on file  Food Insecurity: Not on file  Transportation Needs: Not on file  Physical Activity: Not on file  Stress: Not on file  Social Connections: Not on file   Family History  Problem Relation Age of Onset   Heart attack Mother    Migraines Mother    Heart disease Mother    Heart attack Maternal Grandmother    Heart disease Maternal Grandmother    Diabetes Maternal Grandfather    Asthma Paternal Grandmother    Diabetes Paternal Grandfather    Past Surgical History:  Procedure Laterality Date   : LAPAROSCOPY, SURGICAL; COLECTOMY, PARTIAL, WITH REMOVAL OF TERMINAL ILEUM WITH ILEOCOLOSTOMY 02/06/2022  02/06/2022   ABLATION     ELECTROPHYSIOLOGIC STUDY N/A 06/08/2015   Procedure: SVT Ablation;  Surgeon: Hillis Range, MD;  Location: [facility] INVASIVE CV LAB;  Service: Cardiovascular;  Laterality: N/A;   ELECTROPHYSIOLOGY STUDY N/A 09/11/2014   Procedure: ELECTROPHYSIOLOGY STUDY;  Surgeon: Duke Salvia, MD;  Location: [facility] CATH LAB;  Service: Cardiovascular;  Laterality: N/A;   IMPLANTABLE CARDIOVERTER DEFIBRILLATOR IMPLANT N/A 09/13/2014   Procedure: IMPLANTABLE CARDIOVERTER DEFIBRILLATOR IMPLANT;  Surgeon: Duke Salvia, MD;  Location: [facility] CATH LAB;  Service: Cardiovascular;  Laterality: N/A;; STJ dual chamber ICD implanted by Dr Graciela Husbands for aborted cardiac arrest       Mardella Layman, MD 07/06/23 [id number]

## 2023-07-22 ENCOUNTER — Other Ambulatory Visit (HOSPITAL_COMMUNITY): Payer: Self-pay | Admitting: Internal Medicine

## 2023-08-27 ENCOUNTER — Ambulatory Visit (INDEPENDENT_AMBULATORY_CARE_PROVIDER_SITE_OTHER): Payer: 59

## 2023-08-27 DIAGNOSIS — I469 Cardiac arrest, cause unspecified: Secondary | ICD-10-CM

## 2023-08-27 DIAGNOSIS — I428 Other cardiomyopathies: Secondary | ICD-10-CM | POA: Diagnosis not present

## 2023-08-28 LAB — CUP PACEART REMOTE DEVICE CHECK
Battery Remaining Longevity: 8 mo
Battery Remaining Percentage: 8 %
Battery Voltage: 2.65 V
Brady Statistic AP VP Percent: 1 %
Brady Statistic AP VS Percent: 39 %
Brady Statistic AS VP Percent: 1 %
Brady Statistic AS VS Percent: 60 %
Brady Statistic RA Percent Paced: 36 %
Brady Statistic RV Percent Paced: 1 %
Date Time Interrogation Session: 20241017020017
HighPow Impedance: 72 Ohm
HighPow Impedance: 72 Ohm
Implantable Lead Connection Status: 753985
Implantable Lead Connection Status: 753985
Implantable Lead Implant Date: 20151104
Implantable Lead Implant Date: 20151104
Implantable Lead Location: 753859
Implantable Lead Location: 753860
Implantable Lead Model: 5076
Implantable Pulse Generator Implant Date: 20151104
Lead Channel Impedance Value: 330 Ohm
Lead Channel Impedance Value: 490 Ohm
Lead Channel Pacing Threshold Amplitude: 0.5 V
Lead Channel Pacing Threshold Amplitude: 1 V
Lead Channel Pacing Threshold Pulse Width: 0.4 ms
Lead Channel Pacing Threshold Pulse Width: 0.4 ms
Lead Channel Sensing Intrinsic Amplitude: 3.8 mV
Lead Channel Sensing Intrinsic Amplitude: 8.2 mV
Lead Channel Setting Pacing Amplitude: 2 V
Lead Channel Setting Pacing Amplitude: 2.5 V
Lead Channel Setting Pacing Pulse Width: 0.4 ms
Lead Channel Setting Sensing Sensitivity: 0.5 mV
Pulse Gen Serial Number: 7179320

## 2023-08-31 ENCOUNTER — Encounter (HOSPITAL_COMMUNITY): Payer: 59 | Admitting: Internal Medicine

## 2023-09-03 ENCOUNTER — Encounter (HOSPITAL_COMMUNITY): Payer: Self-pay | Admitting: Internal Medicine

## 2023-09-03 ENCOUNTER — Ambulatory Visit (HOSPITAL_COMMUNITY)
Admission: RE | Admit: 2023-09-03 | Discharge: 2023-09-03 | Disposition: A | Discharge status: Home / Self Care | Payer: 59 | Source: Ambulatory Visit | Attending: Internal Medicine | Admitting: Internal Medicine

## 2023-09-03 VITALS — BP 154/98 | HR 84 | Wt 198.4 lb

## 2023-09-03 DIAGNOSIS — I110 Hypertensive heart disease with heart failure: Secondary | ICD-10-CM | POA: Insufficient documentation

## 2023-09-03 DIAGNOSIS — K5090 Crohn's disease, unspecified, without complications: Secondary | ICD-10-CM | POA: Insufficient documentation

## 2023-09-03 DIAGNOSIS — I428 Other cardiomyopathies: Secondary | ICD-10-CM | POA: Insufficient documentation

## 2023-09-03 DIAGNOSIS — I471 Supraventricular tachycardia, unspecified: Secondary | ICD-10-CM | POA: Diagnosis not present

## 2023-09-03 DIAGNOSIS — Z95810 Presence of automatic (implantable) cardiac defibrillator: Secondary | ICD-10-CM | POA: Diagnosis not present

## 2023-09-03 DIAGNOSIS — Z79899 Other long term (current) drug therapy: Secondary | ICD-10-CM | POA: Insufficient documentation

## 2023-09-03 DIAGNOSIS — I5022 Chronic systolic (congestive) heart failure: Secondary | ICD-10-CM | POA: Diagnosis present

## 2023-09-03 DIAGNOSIS — I10 Essential (primary) hypertension: Secondary | ICD-10-CM | POA: Diagnosis not present

## 2023-09-03 DIAGNOSIS — Z8679 Personal history of other diseases of the circulatory system: Secondary | ICD-10-CM | POA: Insufficient documentation

## 2023-09-03 NOTE — Addendum Note (Signed)
Encounter addended by: Noralee Space, RN on: 09/03/2023 4:06 PM  Actions taken: Order list changed, Diagnosis association updated, Clinical Note Signed

## 2023-09-03 NOTE — Progress Notes (Signed)
[facility]  PCP: Irven Coe, MD EP: Sherryl Manges HF Cardiologist: Dr. Gala Romney  HPI: 37 y.o. female with PMH of Crohn's disease, migraine headaches, Chronic Systolic CHF, SVT s/p ablation 06/08/15, Vtach, presumed long QT, s/p ICD implant. Sees Neurology for vertigo and headaches.  Initially she presented to [facility] ER on 08/31/14 after collapse at home in the setting of prolonged diarrhea 2/2 crohn's disease.  Arrived with K of 2.6. She was unresponsive for approx. 10-15 minutes before fire department arrived and initiated CPR for 15 minutes until EMS arrived and initiated ACLS with CPR for 15 more minutes prior to ROSC.  Pt was defibrillated 4x for vfib, and received 4 rounds of epinephrine, 150 mg amiodarone, narcan, D50, and was intubated.  10/24- rewarmed, followed commands.  She had an ECHO 08/31/14 with EF 25-30% which was thought to be secondary to her arrest, given that her repeat on 09/08/14 EF improved to 60%. Post cardiac arrest she developed sinus bradycardia and ATN.  Because of her acute kidney injury secondary to the arrest she could not undergo cardiac catheterization.  09/11/14 had epinephrine infusion observation with rare ectopic beats and stable QT at .  Also had flecainide brugada challenge which was negative.  Stress Myoview on 09/12/14 showed no evidence of prior infarct or ischemia, mild diffuse global hypokinesis, EF 52%.  She underwent dual chamber St Jude  ICD on 09/11/14.    12/08/2014 defibrillator discharged and she went to the ED interrogation revealed episode of polymorphic Vtach followed by SVT/atrial tach initial rate 240. EP consulted and restarted her propanolol (was stopped at outpatient visit due to dizziness) and to follow up with Dr. Graciela Husbands.  Dr. Graciela Husbands increased propanolol to 120 mg and performed SVT ablation 06/08/15.    08/2016 came to her outpatient visit with Dr. Graciela Husbands and was severely hypotensive found to have lost 24 additional pounds  related to Crohn's. No further SVT or Vtach noted.  Propanolol d/ced.  Admitted for failure to thrive 10/2016 at [facility] up to 50lb weight loss at that time started on steroids, TPN.    ECHO studies stable normal EF until 11/06/2020 where her EF dropped down to 35-40%.    We saw her for the first time in 5/22.  EF 45-50% and than symptoms related more to deconditioning than HF. Referred to our exercise physiologist who was coaching her. Participated in PT earlier this year. Discharged d/t lack of progress.   Echo 11/22 EF 55%  Had bowel resection in 3/23 at [facility].  Echo 12/23: EF 45%  Today she returns for HF follow up. Feels pretty good. Denies CP or SOB. Following at [facility] for her Crohn's - no recent flares. Remains active with work around the house.Typically not SOB unless she pushes it or it is hot outside. No edema, CP, orthopnea or PND. Still with dizziness.   ICD interrogation: No VT/AF. 35% atrial pacing. Fluid ok Personally reviewed   FH:  Some Asthma in family, Grandmother, Uncle  Mother with MVP, MI at age 7  Past Medical History:  Diagnosis Date   AICD (automatic cardioverter/defibrillator) present 09/14/2015   STJ dual chamber ICD implanted by Dr Graciela Husbands for aborted cardiac arrest   Allergy    Bradycardia    Cardiac arrest (HCC) 08/31/2014   Crohn disease (HCC)    Depression    History of blood transfusion 09/2012   "had allergic rxn to one of my Crohn's RX"    Migraine    "a  few times/yr" (06/08/2015)   Pancytopenia (HCC) 11/23/2012   Status post radiofrequency ablation for arrhythmia, atrial tach 06/08/15 06/08/2015   SVT (supraventricular tachycardia) (HCC) 12/08/2014   likely an atrial tachycardia with CL 250 msec    Current Outpatient Medications  Medication Sig Dispense Refill   acetaminophen (TYLENOL) 325 MG tablet Take 650 mg by mouth every 6 (six) hours as needed for headache.     cetirizine (ZYRTEC) 10 MG tablet Take 10 mg by mouth daily.     Cholecalciferol  (VITAMIN D3) 50 MCG (2000 UT) CHEW Chew by mouth daily.     ciprofloxacin (CIPRO) 500 MG tablet Take 500 mg by mouth 2 (two) times daily.     Cobalamin Combinations (B-12) [phone] MCG SUBL Place 1 tablet under the tongue daily.     colestipol (COLESTID) 1 g tablet Take 1 g by mouth as needed.     diazepam (VALIUM) 5 MG tablet TAKE 1 TABLET (5 MG TOTAL) BY MOUTH EVERY 12 (TWELVE) HOURS AS NEEDED (VERTIGPO). 10 tablet 0   Galcanezumab-gnlm (EMGALITY) 120 MG/ML SOAJ Inject 120 mg into the skin every 30 (thirty) days. 1 mL 11   ondansetron (ZOFRAN) 4 MG tablet Take 4 mg by mouth as needed for nausea or vomiting.     Pediatric Multiple Vit-C-FA (ANIMAL CHEWABLE MULTIVITAMIN PO) Take 1 tablet by mouth daily.     potassium chloride SA (KLOR-CON M20) 20 MEQ tablet TAKE 1 TABLET BY MOUTH EVERY DAY 90 tablet 2   SKYRIZI 360 MG/2.4ML SOCT Inject into the skin every 8 (eight) weeks.     spironolactone (ALDACTONE) 25 MG tablet TAKE 1 TABLET (25 MG TOTAL) BY MOUTH DAILY. 90 tablet 1   triamcinolone cream (KENALOG) 0.1 % Apply 1 Application topically 2 (two) times daily as needed.     losartan (COZAAR) 25 MG tablet Take 1 tablet (25 mg total) by mouth daily. 90 tablet 3   No current facility-administered medications for this encounter.   Allergies  Allergen Reactions   Sulfa Antibiotics Rash   Sulfacetamide Sodium Rash   Social History   Socioeconomic History   Marital status: Single    Spouse name: Not on file   Number of children: Not on file   Years of education: Not on file   Highest education level: Not on file  Occupational History   Not on file  Tobacco Use   Smoking status: Never   Smokeless tobacco: Never  Vaping Use   Vaping status: Never Used  Substance and Sexual Activity   Alcohol use: Yes    Comment: occasional/social   Drug use: No   Sexual activity: Never  Other Topics Concern   Not on file  Social History Narrative   Caffeine: sodas 2/day, some tea    Social  Determinants of Health   Financial Resource Strain: Not on file  Food Insecurity: Not on file  Transportation Needs: Not on file  Physical Activity: Not on file  Stress: Not on file  Social Connections: Not on file  Intimate Partner Violence: Not on file   Family History  Problem Relation Age of Onset   Heart attack Mother    Migraines Mother    Heart disease Mother    Heart attack Maternal Grandmother    Heart disease Maternal Grandmother    Diabetes Maternal Grandfather    Asthma Paternal Grandmother    Diabetes Paternal Grandfather    BP (!) 154/98   Pulse 84   Wt 90 kg (198 lb  6.4 oz)   SpO2 97%   BMI 33.02 kg/m   Wt Readings from Last 3 Encounters:  09/03/23 90 kg (198 lb 6.4 oz)  03/02/23 83.5 kg (184 lb)  01/15/23 81.2 kg (179 lb)   Physical Exam General:  Well appearing. No resp difficulty HEENT: normal Neck: supple. no JVD. Carotids 2+ bilat; no bruits. No lymphadenopathy or thryomegaly appreciated. Cor: PMI nondisplaced. Regular rate & rhythm. No rubs, gallops or murmurs. Lungs: clear Abdomen: obese soft, nontender, nondistended. No hepatosplenomegaly. No bruits or masses. Good bowel sounds. Extremities: no cyanosis, clubbing, rash, edema Neuro: alert & orientedx3, cranial nerves grossly intact. moves all 4 extremities w/o difficulty. Affect pleasant   ASSESSMENT & PLAN:  1. 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 - Repeat echo (09/08/14): EF improved to 60%  - Could not undergo LHC due to ATN from arrest, she had dual chamber St. Jude ICD implanted 09/13/14 - Subsequent echo studies with normal EF through 2020 low normal at 50-55%  - Echo 11/06/2020 EF read as 35-40% - EF from 12/21 Likely ~45% if not 45-50% with normal diastolic function - Echo (11/22): EF 55% - Echo (12/23): EF  45% - NYHA II. Stable. Volume ok on exam and device.  Not requiring diuretics. GDMT limited by chronic dizziness. - Continue losartan 25 (failed Entresto due to dizziness) - she prefers to stay where she is at and not titrate today. Wil repeat echo to reassess EF.  - Continue spiro 25 mg qhs. - Not able to tolerate coreg and propanolol in the past d/t dizziness and orthostasis - Unable to tolerate Jardiance due to dizziness. - ICD followed by Dr. Graciela Husbands, interrogation as above   2. Crohn's Disease: - Now followed by [facility] - s/p bowel resection at [facility] in 3/23. - no change  3. H/o SVT: - felt to be atrial tach had an ablation 06/08/15 - no recurrence on device  4. HTN - says BP usually controlled. Asked her to follow and make sure SBP < 140.    Arvilla Meres, MD  09/03/23 3:52 PM

## 2023-09-03 NOTE — Patient Instructions (Signed)
Medication Changes:  None, continue current medications  Testing/Procedures:  Your physician has requested that you have an echocardiogram. Echocardiography is a painless test that uses sound waves to create images of your heart. It provides your doctor with information about the size and shape of your heart and how well your heart's chambers and valves are working. This procedure takes approximately one hour. There are no restrictions for this procedure. Please do NOT wear cologne, perfume, aftershave, or lotions (deodorant is allowed). Please arrive 15 minutes prior to your appointment time.   Referrals:  none  Special Instructions // Education:  Do the following things EVERYDAY: Weigh yourself in the morning before breakfast. Write it down and keep it in a log. Take your medicines as prescribed Eat low salt foods--Limit salt (sodium) to 2000 mg per day.  Stay as active as you can everyday Limit all fluids for the day to less than 2 liters   Follow-Up in: 6 months (April 2025), **PLEASE CALL OUR OFFICE IN FEBRUARY TO SCHEDULE THIS APPOINTMENT   At the [facility], you and your health needs are our priority. We have a designated team specialized in the treatment of Heart Failure. This Care Team includes your primary Heart Failure Specialized Cardiologist (physician), Advanced Practice Providers (APPs- Physician Assistants and Nurse Practitioners), and Pharmacist who all work together to provide you with the care you need, when you need it.   You may see any of the following providers on your designated Care Team at your next follow up:  Dr. Arvilla Meres Dr. Marca Ancona Dr. Dorthula Nettles Dr. Theresia Bough Tonye Becket, NP Robbie Lis, [place] [facility] [place], [place] Brynda Peon, NP [place] Lee, NP Karle Plumber, PharmD   Please be sure to bring in all your medications bottles to every appointment.   Need to Contact us:  If you have any  questions or concerns before your next appointment please send [place] a message through [place] or call our office at [phone].    TO LEAVE A MESSAGE FOR THE NURSE SELECT OPTION 2, PLEASE LEAVE A MESSAGE INCLUDING: YOUR NAME DATE OF BIRTH CALL BACK NUMBER REASON FOR CALL**this is important as we prioritize the call backs  YOU WILL RECEIVE A CALL BACK THE SAME DAY AS LONG AS YOU CALL BEFORE 4:00 PM

## 2023-09-14 ENCOUNTER — Telehealth (HOSPITAL_COMMUNITY): Payer: Self-pay | Admitting: *Deleted

## 2023-09-16 NOTE — Progress Notes (Signed)
Remote ICD transmission.   

## 2023-09-29 ENCOUNTER — Encounter: Payer: Self-pay | Admitting: Internal Medicine

## 2023-09-29 ENCOUNTER — Encounter: Payer: 59 | Admitting: Internal Medicine

## 2023-10-01 ENCOUNTER — Encounter: Payer: 59 | Admitting: Internal Medicine

## 2023-10-01 NOTE — Progress Notes (Addendum)
Julie Saunders D.[name] [address] [place] [address] Phone: [phone]   Assessment and Plan:     1. Chronic pain of right ankle -Chronic with exacerbation, initial sports medicine visit - History of remote trauma and ankle sprains with acute flare of right ankle pain over the past 3 weeks.  Likely flared from patient walking on uneven terrain without specific MOI.  Extensor digitorum, anterior tibialis, TDH tendons all appear inflamed on physical exam today - Start prednisone 10 mg for 10 days - Start Tylenol 500 to 1000 mg tablets 2-3 times a day for day-to-day pain relief -Recommend using topical Voltaren gel over areas of pain - Recommend using lace up ankle brace to use for support - Start HEP for ankle -Do not recommend NSAID use with past medical history Crohn's disease  15 additional minutes spent for educating Therapeutic Home Exercise Program.  This included exercises focusing on stretching, strengthening, with focus on eccentric aspects.   Long term goals include an improvement in range of motion, strength, endurance as well as avoiding reinjury. Patient's frequency would include in 1-2 times a day, 3-5 times a week for a duration of 6-12 weeks. Proper technique shown and discussed handout in great detail with ATC.  All questions were discussed and answered.     Pertinent previous records reviewed include none  Follow Up: 3 to 4 weeks for reevaluation.  Could consider ultrasound versus CSI versus physical therapy   Subjective:   I, Julie Saunders, am serving as a Neurosurgeon for Doctor Richardean Sale  Chief Complaint: right ankle pain   HPI:   10/02/2023 Patient is a 37 year old female with concerns of right ankle pain. Patient states that she was walking on uneven ground. Pain for about 2-3 weeks. Tylenol for the pain and helps minimal. Pain radiates up the leg and around the foot. RICES and uses a brace   Relevant  Historical Information: crohns disease  Additional pertinent review of systems negative.   Current Outpatient Medications:    acetaminophen (TYLENOL) 325 MG tablet, Take 650 mg by mouth every 6 (six) hours as needed for headache., Disp: , Rfl:    cetirizine (ZYRTEC) 10 MG tablet, Take 10 mg by mouth daily., Disp: , Rfl:    Cholecalciferol (VITAMIN D3) 50 MCG (2000 UT) CHEW, Chew by mouth daily., Disp: , Rfl:    ciprofloxacin (CIPRO) 500 MG tablet, Take 500 mg by mouth 2 (two) times daily., Disp: , Rfl:    Cobalamin Combinations (B-12) [phone] MCG SUBL, Place 1 tablet under the tongue daily., Disp: , Rfl:    colestipol (COLESTID) 1 g tablet, Take 1 g by mouth as needed., Disp: , Rfl:    diazepam (VALIUM) 5 MG tablet, TAKE 1 TABLET (5 MG TOTAL) BY MOUTH EVERY 12 (TWELVE) HOURS AS NEEDED (VERTIGPO)., Disp: 10 tablet, Rfl: 0   Galcanezumab-gnlm (EMGALITY) 120 MG/ML SOAJ, Inject 120 mg into the skin every 30 (thirty) days., Disp: 1 mL, Rfl: 11   ondansetron (ZOFRAN) 4 MG tablet, Take 4 mg by mouth as needed for nausea or vomiting., Disp: , Rfl:    Pediatric Multiple Vit-C-FA (ANIMAL CHEWABLE MULTIVITAMIN PO), Take 1 tablet by mouth daily., Disp: , Rfl:    potassium chloride SA (KLOR-CON M20) 20 MEQ tablet, TAKE 1 TABLET BY MOUTH EVERY DAY, Disp: 90 tablet, Rfl: 2   predniSONE (DELTASONE) 10 MG tablet, Take one tablet daily for the next 5 days., Disp: 10 tablet, Rfl:  0   SKYRIZI 360 MG/2.4ML SOCT, Inject into the skin every 8 (eight) weeks., Disp: , Rfl:    spironolactone (ALDACTONE) 25 MG tablet, TAKE 1 TABLET (25 MG TOTAL) BY MOUTH DAILY., Disp: 90 tablet, Rfl: 1   triamcinolone cream (KENALOG) 0.1 %, Apply 1 Application topically 2 (two) times daily as needed., Disp: , Rfl:    losartan (COZAAR) 25 MG tablet, Take 1 tablet (25 mg total) by mouth daily., Disp: 90 tablet, Rfl: 3   Objective:     Vitals:   10/02/23 1407  Pulse: 66  SpO2: 98%  Weight: 198 lb (89.8 kg)  Height: 5\' 5"  (1.651 m)       Body mass index is 32.95 kg/m.    Physical Exam:    Gen: Appears well, nad, nontoxic and pleasant Psych: Alert and oriented, appropriate mood and affect Neuro: sensation intact, strength is 5/5 with df/pf/inv/ev, muscle tone wnl Skin: no susupicious lesions or rashes  Right ankle: Pes planus No deformity, no swelling or effusion TTP significantly anterior joint line, navicular, medial malleolus, posterior to medial malleolus along TDH tendons NTTP over fibular head, lat mal,achilles, , base of 5th, ATFL, CFL, , calcaneous ROM DF 30, PF 45, inv/ev intact Negative ant drawer, talar tilt, rotation test, squeeze test. Neg thompson pain with resisted dorsiflexion, plantarflexion, inversion and eversion    Electronically signed by:  Julie Saunders D.[name] 2:25 PM 10/02/23

## 2023-10-02 ENCOUNTER — Ambulatory Visit: Payer: 59 | Admitting: Sports Medicine

## 2023-10-02 ENCOUNTER — Ambulatory Visit (INDEPENDENT_AMBULATORY_CARE_PROVIDER_SITE_OTHER): Payer: 59

## 2023-10-02 VITALS — HR 66 | Ht 65.0 in | Wt 198.0 lb

## 2023-10-02 DIAGNOSIS — M25571 Pain in right ankle and joints of right foot: Secondary | ICD-10-CM

## 2023-10-02 DIAGNOSIS — G8929 Other chronic pain: Secondary | ICD-10-CM

## 2023-10-02 MED ORDER — PREDNISONE 10 MG PO TABS: ORAL_TABLET | ORAL | 0 refills | Status: DC

## 2023-10-02 NOTE — Patient Instructions (Signed)
Tylenol [phone] mg 2-3 times a day for pain relief  Prednisone 10 mg for 10 days  Voltaren gel over areas of pain Use a lace up ankle brace when active  Ankle HEP  3-4 week follow up

## 2023-10-07 ENCOUNTER — Ambulatory Visit (HOSPITAL_COMMUNITY)
Admission: RE | Admit: 2023-10-07 | Discharge: 2023-10-07 | Disposition: A | Discharge status: Home / Self Care | Payer: 59 | Source: Ambulatory Visit | Attending: Family Medicine | Admitting: Family Medicine

## 2023-10-07 DIAGNOSIS — I4901 Ventricular fibrillation: Secondary | ICD-10-CM | POA: Insufficient documentation

## 2023-10-07 DIAGNOSIS — I471 Supraventricular tachycardia, unspecified: Secondary | ICD-10-CM | POA: Insufficient documentation

## 2023-10-07 DIAGNOSIS — I5022 Chronic systolic (congestive) heart failure: Secondary | ICD-10-CM

## 2023-10-07 DIAGNOSIS — I429 Cardiomyopathy, unspecified: Secondary | ICD-10-CM | POA: Insufficient documentation

## 2023-10-07 DIAGNOSIS — I469 Cardiac arrest, cause unspecified: Secondary | ICD-10-CM | POA: Diagnosis not present

## 2023-10-07 DIAGNOSIS — I5040 Unspecified combined systolic (congestive) and diastolic (congestive) heart failure: Secondary | ICD-10-CM | POA: Diagnosis present

## 2023-10-07 LAB — ECHOCARDIOGRAM COMPLETE
Area-P 1/2: 2.52 cm2
Calc EF: 51.4 %
S' Lateral: 4 cm
Single Plane A2C EF: 51.8 %
Single Plane A4C EF: 48.2 %

## 2023-10-07 NOTE — Progress Notes (Signed)
  Echocardiogram 2D Echocardiogram has been performed.  Julie Saunders 10/07/2023, 3:51 PM

## 2023-10-12 ENCOUNTER — Telehealth: Payer: Self-pay | Admitting: Sports Medicine

## 2023-10-12 NOTE — Telephone Encounter (Signed)
Patient called stating that on 10/03/23 she somehow injured her left foot/ankle. She said that she is not sure how she did it but it started hurting and was almost to the point of not being able to put any weight on it by the end of the day. She said that she has been using crutches as needed and a lace up ankle brace as well. She mentioned that she has been Ciprofloxacin and asked if this could be causing any issues?  Please advise.

## 2023-10-22 ENCOUNTER — Other Ambulatory Visit: Payer: Self-pay | Admitting: Internal Medicine

## 2023-10-26 ENCOUNTER — Ambulatory Visit: Payer: 59 | Attending: Internal Medicine | Admitting: Internal Medicine

## 2023-10-26 ENCOUNTER — Encounter: Payer: Self-pay | Admitting: Internal Medicine

## 2023-10-26 VITALS — BP 124/96 | HR 77 | Ht 65.0 in | Wt 202.8 lb

## 2023-10-26 DIAGNOSIS — I429 Cardiomyopathy, unspecified: Secondary | ICD-10-CM

## 2023-10-26 DIAGNOSIS — I471 Supraventricular tachycardia, unspecified: Secondary | ICD-10-CM

## 2023-10-26 DIAGNOSIS — I469 Cardiac arrest, cause unspecified: Secondary | ICD-10-CM | POA: Diagnosis not present

## 2023-10-26 DIAGNOSIS — Z95810 Presence of automatic (implantable) cardiac defibrillator: Secondary | ICD-10-CM

## 2023-10-26 NOTE — Progress Notes (Unsigned)
Patient Care Team: Irven Coe, MD as PCP - General (Family Medicine) Britt Bolognese, MD as Referring Physician (Gastroenterology) Bensimhon, Bevelyn Buckles, MD as Consulting Physician (Cardiology) Duke Salvia, MD as Consulting Physician (Cardiology) Karle Barr, MD as Referring Physician (Otolaryngology)   HPI  Julie Saunders is a 37 y.o. female Seen in follow-up for aborted cardiac arrest 2015.  No specific cause was identified. She was treated with beta blockers initially but these were stopped because of hypoten   She has subsequently undergone EPS RFCA  And these records were reveiewed>>ectopic tachycardia successful  RFCA    Recurrent palps >> egrams suggest PVC/AVNRT or Atrial Tach with 1 AVB  Lightheadedness, not orthostatic.  Has seen neurology thinks it is "nerve damage "potentially related to her cardiac arrest.  No heat intolerance.   Crohn's has been active.  Underwent partial small bowel resection with a primary anastomosis.   followed at [facility].     She reminds me that her mother, only child,died suddenly at 32+ yo "heart attach" no autopsy ? Familial hypokalemia ??   Dyspnea on exertion.  No palpitations.  No chest pain or edema.  DATE TEST EF   11/15 MYOVIEW 52% No ischemia/infarct  12/17 Echo 55-65%   12/21 Echo  35-40%   11/22 Echo  55%   11/24 Echo  45-50%            Date Cr K Hgb TSH  1/16      1.053  10/17 1.04 2.8  1.69   10/18 1.01 4.0    8/19 1.08 3.8    6/20 1.13 4.5 12.3   +9/22 1.2 4.0 13.1          Reviewed family history, mother died suddenly 2 months before her daughter's cardiac arrest.  Somehow this association was missed.  I reviewed her mother's home records, nothing really is informative.  She had a history of mitral valve prolapse apparently according to the daughter no other cardiac history.  No echo is available.            Alb   Past Medical History:  Diagnosis Date   AICD (automatic cardioverter/defibrillator) present  09/14/2015   STJ dual chamber ICD implanted by Dr Graciela Husbands for aborted cardiac arrest   Allergy    Bradycardia    Cardiac arrest (HCC) 08/31/2014   Crohn disease (HCC)    Depression    History of blood transfusion 09/2012   "had allergic rxn to one of my Crohn's RX"    Migraine    "a few times/yr" (06/08/2015)   Pancytopenia (HCC) 11/23/2012   Status post radiofrequency ablation for arrhythmia, atrial tach 06/08/15 06/08/2015   SVT (supraventricular tachycardia) (HCC) 12/08/2014   likely an atrial tachycardia with CL 250 msec    Past Surgical History:  Procedure Laterality Date   : LAPAROSCOPY, SURGICAL; COLECTOMY, PARTIAL, WITH REMOVAL OF TERMINAL ILEUM WITH ILEOCOLOSTOMY 02/06/2022  02/06/2022   ABLATION     ELECTROPHYSIOLOGIC STUDY N/A 06/08/2015   Procedure: SVT Ablation;  Surgeon: Hillis Range, MD;  Location: [facility] INVASIVE CV LAB;  Service: Cardiovascular;  Laterality: N/A;   ELECTROPHYSIOLOGY STUDY N/A 09/11/2014   Procedure: ELECTROPHYSIOLOGY STUDY;  Surgeon: Duke Salvia, MD;  Location: [facility] CATH LAB;  Service: Cardiovascular;  Laterality: N/A;   IMPLANTABLE CARDIOVERTER DEFIBRILLATOR IMPLANT N/A 09/13/2014   Procedure: IMPLANTABLE CARDIOVERTER DEFIBRILLATOR IMPLANT;  Surgeon: Duke Salvia, MD;  Location: [facility] CATH LAB;  Service: Cardiovascular;  Laterality: N/A;;  STJ dual chamber ICD implanted by Dr Graciela Husbands for aborted cardiac arrest    Current Outpatient Medications  Medication Sig Dispense Refill   acetaminophen (TYLENOL) 325 MG tablet Take 650 mg by mouth every 6 (six) hours as needed for headache.     cetirizine (ZYRTEC) 10 MG tablet Take 10 mg by mouth daily.     Cholecalciferol (VITAMIN D3) 50 MCG (2000 UT) CHEW Chew by mouth daily.     Cobalamin Combinations (B-12) [phone] MCG SUBL Place 1 tablet under the tongue daily.     colestipol (COLESTID) 1 g tablet Take 1 g by mouth as needed.     diazepam (VALIUM) 5 MG tablet TAKE 1 TABLET (5 MG TOTAL) BY MOUTH EVERY 12 (TWELVE) HOURS  AS NEEDED (VERTIGPO). 10 tablet 0   Galcanezumab-gnlm (EMGALITY) 120 MG/ML SOAJ Inject 120 mg into the skin every 30 (thirty) days. 1 mL 11   losartan (COZAAR) 25 MG tablet Take 1 tablet (25 mg total) by mouth daily. 90 tablet 3   ondansetron (ZOFRAN) 4 MG tablet Take 4 mg by mouth as needed for nausea or vomiting.     Pediatric Multiple Vit-C-FA (ANIMAL CHEWABLE MULTIVITAMIN PO) Take 1 tablet by mouth daily.     predniSONE (DELTASONE) 10 MG tablet Take one tablet daily for the next 5 days. 10 tablet 0   SKYRIZI 360 MG/2.4ML SOCT Inject into the skin every 8 (eight) weeks.     spironolactone (ALDACTONE) 25 MG tablet TAKE 1 TABLET (25 MG TOTAL) BY MOUTH DAILY. 90 tablet 1   No current facility-administered medications for this visit.    Allergies  Allergen Reactions   Sulfa Antibiotics Rash   Sulfacetamide Sodium Rash    Review of Systems negative except from HPI and PMH  Physical Exam BP (!) 124/96   Pulse 77   Ht 5\' 5"  (1.651 m)   Wt 202 lb 12.8 oz (92 kg)   LMP 10/01/2023   SpO2 99%   BMI 33.75 kg/m  Well developed and well nourished in no acute distress HENT normal Neck supple with JVP-flat Clear Device pocket well healed; without hematoma or erythema.  There is no tethering  Regular rate and rhythm, no  gallop No murmur Abd-soft with active BS No Clubbing cyanosis  edema Skin-warm and dry A & Oriented  Grossly normal sensory and motor function  ECG sinus @ 71 14/09/42  Device function is normal. Programming changes rate response activated  See Paceart for details     Assessment and  Plan  Aborted cardiac arrest  ICD [employer]     Cardiomyopathy-progressive  Supraventricular tachycardia  status post RFCA-JA  Crohn's disease  Dizziness  Rightward axis new  Hypertension  HFmrEF  Iron deficiency anemia     Cardiomyopathy modest.  Blood pressure limits GDMT.  Will continue low-dose losartan and beta-blockers.  Heart rate excursion was really very  limited.  Activated rate response and took a couple of walks with significant improvement in dyspnea.  Device is approaching ERI.

## 2023-10-26 NOTE — Patient Instructions (Signed)

## 2023-10-29 ENCOUNTER — Telehealth: Payer: Self-pay | Admitting: Internal Medicine

## 2023-10-29 ENCOUNTER — Ambulatory Visit: Payer: 59 | Attending: Cardiology

## 2023-10-29 DIAGNOSIS — I469 Cardiac arrest, cause unspecified: Secondary | ICD-10-CM

## 2023-10-29 DIAGNOSIS — I429 Cardiomyopathy, unspecified: Secondary | ICD-10-CM

## 2023-10-29 DIAGNOSIS — I428 Other cardiomyopathies: Secondary | ICD-10-CM

## 2023-10-29 NOTE — Progress Notes (Signed)
Patient brought back in to clinic today due to becoming more symptomatic with dizziness and shortness of breath following Rate Response initiation and programming at 12/16 OV with Dr. Graciela Husbands.   Interrogation performed with Nicole[employer] rep's assistance.  It was determined rate response was too aggressive and the following changes made:    After walking in clinic to test adjustment results, patient reports feeling much better.  She has our device clinic number and knows to call us if any further questions or concerns.

## 2023-10-29 NOTE — Progress Notes (Signed)
Aleen Sells D.[name] [address] [place] [address] Phone: [phone]   Assessment and Plan:     1. Chronic pain of right ankle -Chronic with exacerbation, subsequent visit - Resolved flare of right ankle pain after course of prednisone and starting HEP - Discontinue prednisone.  Dosepak completed - Continue HEP - Use Tylenol as needed  2. Left foot pain 3. Plantar fasciitis of left foot -Acute, uncomplicated, initial sports medicine visit - Most consistent with plantar fasciitis of left foot.  Possibly flared due to compensation versus tissue inflammation related to ciprofloxacin course - Start HEP and physical therapy for plantar fasciitis - Start Voltaren gel topically over areas of pain - May use Tylenol for day-to-day pain relief - Do not recommend NSAID use due to past medical history of Crohn's.  Do not recommend repeat prednisone course since we completed a prednisone course last month.   Pertinent previous records reviewed include left foot x-ray from 07/04/2023  Follow Up: 4 weeks for reevaluation.  If no improvement or worsening of symptoms, could consider plantar fasciitis CSI   Subjective:   I, Jerene Canny, am serving as a Neurosurgeon for Doctor Richardean Sale   Chief Complaint: right ankle pain    HPI:    10/02/2023 Patient is a 37 year old female with concerns of right ankle pain. Patient states that she was walking on uneven ground. Pain for about 2-3 weeks. Tylenol for the pain and helps minimal. Pain radiates up the leg and around the foot. RICES and uses a brace   10/30/2023 Patient states right ankle has improved but now her left ankle/ foot is in pain    Relevant Historical Information: crohns disease Additional pertinent review of systems negative.   Current Outpatient Medications:    acetaminophen (TYLENOL) 325 MG tablet, Take 650 mg by mouth every 6 (six) hours as needed for headache., Disp: ,  Rfl:    cetirizine (ZYRTEC) 10 MG tablet, Take 10 mg by mouth daily., Disp: , Rfl:    Cholecalciferol (VITAMIN D3) 50 MCG (2000 UT) CHEW, Chew by mouth daily., Disp: , Rfl:    Cobalamin Combinations (B-12) [phone] MCG SUBL, Place 1 tablet under the tongue daily., Disp: , Rfl:    colestipol (COLESTID) 1 g tablet, Take 1 g by mouth as needed., Disp: , Rfl:    diazepam (VALIUM) 5 MG tablet, TAKE 1 TABLET (5 MG TOTAL) BY MOUTH EVERY 12 (TWELVE) HOURS AS NEEDED (VERTIGPO)., Disp: 10 tablet, Rfl: 0   Galcanezumab-gnlm (EMGALITY) 120 MG/ML SOAJ, Inject 120 mg into the skin every 30 (thirty) days., Disp: 1 mL, Rfl: 11   ondansetron (ZOFRAN) 4 MG tablet, Take 4 mg by mouth as needed for nausea or vomiting., Disp: , Rfl:    Pediatric Multiple Vit-C-FA (ANIMAL CHEWABLE MULTIVITAMIN PO), Take 1 tablet by mouth daily., Disp: , Rfl:    predniSONE (DELTASONE) 10 MG tablet, Take one tablet daily for the next 5 days., Disp: 10 tablet, Rfl: 0   SKYRIZI 360 MG/2.4ML SOCT, Inject into the skin every 8 (eight) weeks., Disp: , Rfl:    spironolactone (ALDACTONE) 25 MG tablet, TAKE 1 TABLET (25 MG TOTAL) BY MOUTH DAILY., Disp: 90 tablet, Rfl: 1   losartan (COZAAR) 25 MG tablet, Take 1 tablet (25 mg total) by mouth daily., Disp: 90 tablet, Rfl: 3   Objective:     Vitals:   10/30/23 1432  BP: 130/82  Pulse: 88  SpO2: 99%  Weight:  202 lb (91.6 kg)  Height: 5\' 5"  (1.651 m)      Body mass index is 33.61 kg/m.    Physical Exam:    Gen: Appears well, nad, nontoxic and pleasant Psych: Alert and oriented, appropriate mood and affect Neuro: sensation intact, strength is 5/5 with df/pf/inv/ev, muscle tone wnl Skin: no susupicious lesions or rashes  Left foot/ankle:  No deformity, no swelling or effusion TTP anterior medial calcaneus, medial and middle plantar fascia bands NTTP over fibular head, lat mal, medial mal, achilles, navicular, base of 5th, ATFL, CFL, deltoid, calcaneous or midfoot ROM DF 30, PF 45,  inv/ev intact Negative ant drawer, talar tilt, rotation test, squeeze test. Neg thompson No pain with resisted inversion or eversion    Electronically signed by:  Aleen Sells D.[name] 2:58 PM 10/30/23

## 2023-10-29 NOTE — Telephone Encounter (Signed)
STAT if patient feels like he/she is going to faint   1. Are you feeling dizzy, lightheaded, or faint right now? Yes     2. Have you passed out?  No does not feel like she is going to pass out  (If yes move to .SYNCOPECHMG)   3. Do you have any other symptoms? No other Symptoms    4. Have you checked your HR and BP (record if available)? She does not have a way to check it,  She stated the settings on her defib was changed Monday the 16th and she has been dizzy every since then   Best number [phone]

## 2023-10-29 NOTE — Telephone Encounter (Signed)
Added onto device clinic schedule today to adjust RR. Judie Grieve with [employer] contacted and will send someone for assistance.

## 2023-10-30 ENCOUNTER — Ambulatory Visit: Payer: 59 | Admitting: Sports Medicine

## 2023-10-30 VITALS — BP 130/82 | HR 88 | Ht 65.0 in | Wt 202.0 lb

## 2023-10-30 DIAGNOSIS — M79672 Pain in left foot: Secondary | ICD-10-CM | POA: Diagnosis not present

## 2023-10-30 DIAGNOSIS — M25571 Pain in right ankle and joints of right foot: Secondary | ICD-10-CM | POA: Diagnosis not present

## 2023-10-30 DIAGNOSIS — M722 Plantar fascial fibromatosis: Secondary | ICD-10-CM

## 2023-10-30 DIAGNOSIS — G8929 Other chronic pain: Secondary | ICD-10-CM | POA: Diagnosis not present

## 2023-10-30 NOTE — Patient Instructions (Addendum)
Foot HEP  PT referral  Use Voltaren gel over areas of pain  4-6 week follow up

## 2023-10-31 LAB — CUP PACEART INCLINIC DEVICE CHECK
Battery Remaining Longevity: 7 mo
Brady Statistic RA Percent Paced: 33 %
Brady Statistic RV Percent Paced: 0.07 %
Date Time Interrogation Session: 20241216182100
Implantable Lead Connection Status: 753985
Implantable Lead Connection Status: 753985
Implantable Lead Implant Date: 20151104
Implantable Lead Implant Date: 20151104
Implantable Lead Location: 753859
Implantable Lead Location: 753860
Implantable Lead Model: 5076
Implantable Pulse Generator Implant Date: 20151104
Lead Channel Sensing Intrinsic Amplitude: 11.8 mV
Lead Channel Sensing Intrinsic Amplitude: 4.7 mV
Lead Channel Setting Pacing Amplitude: 2 V
Lead Channel Setting Pacing Amplitude: 2.5 V
Lead Channel Setting Pacing Pulse Width: 0.4 ms
Lead Channel Setting Sensing Sensitivity: 0.5 mV
Pulse Gen Serial Number: 7179320

## 2023-11-22 NOTE — Therapy (Signed)
 OUTPATIENT PHYSICAL THERAPY LOWER EXTREMITY EVALUATION   Patient Name: Julie Saunders MRN: [id number] DOB:[birth year], 38 y.o., female Today's Date: 11/30/2023  END OF SESSION:  PT End of Session - 11/30/23 0752     Visit Number 1    Number of Visits 12    Date for PT Re-Evaluation 01/21/24    Authorization Type [employer]    PT Start Time 1615    PT Stop Time 1700    PT Time Calculation (min) 45 min    Activity Tolerance Patient tolerated treatment well    Behavior During Therapy [facility] for tasks assessed/performed             Past Medical History:  Diagnosis Date   AICD (automatic cardioverter/defibrillator) present 09/14/2015   STJ dual chamber ICD implanted by Dr Fernande for aborted cardiac arrest   Allergy    Bradycardia    Cardiac arrest (HCC) 08/31/2014   Crohn disease (HCC)    Depression    History of blood transfusion 09/2012   had allergic rxn to one of my Crohn's RX    Migraine    a few times/yr (06/08/2015)   Pancytopenia (HCC) 11/23/2012   Status post radiofrequency ablation for arrhythmia, atrial tach 06/08/15 06/08/2015   SVT (supraventricular tachycardia) (HCC) 12/08/2014   likely an atrial tachycardia with CL 250 msec   Past Surgical History:  Procedure Laterality Date   : LAPAROSCOPY, SURGICAL; COLECTOMY, PARTIAL, WITH REMOVAL OF TERMINAL ILEUM WITH ILEOCOLOSTOMY 02/06/2022  02/06/2022   ABLATION     ELECTROPHYSIOLOGIC STUDY N/A 06/08/2015   Procedure: SVT Ablation;  Surgeon: Lynwood Rakers, MD;  Location: [facility] INVASIVE CV LAB;  Service: Cardiovascular;  Laterality: N/A;   ELECTROPHYSIOLOGY STUDY N/A 09/11/2014   Procedure: ELECTROPHYSIOLOGY STUDY;  Surgeon: Elspeth JAYSON Fernande, MD;  Location: [facility] CATH LAB;  Service: Cardiovascular;  Laterality: N/A;   IMPLANTABLE CARDIOVERTER DEFIBRILLATOR IMPLANT N/A 09/13/2014   Procedure: IMPLANTABLE CARDIOVERTER DEFIBRILLATOR IMPLANT;  Surgeon: Elspeth JAYSON Fernande, MD;  Location: [facility] CATH LAB;  Service: Cardiovascular;  Laterality: N/A;;  STJ dual chamber ICD implanted by Dr Fernande for aborted cardiac arrest   Patient Active Problem List   Diagnosis Date Noted   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   Dual implantable cardioverter-defibrillator in situ 09/22/2018   Crohn's disease of both small and large intestine without complication (HCC) 09/22/2018   Status post radiofrequency ablation for arrhythmia, atrial tach 06/08/15 06/08/2015   SVT (supraventricular tachycardia) (HCC) 04/16/2015   Defibrillator discharge    VT (ventricular tachycardia) (HCC)    Chronic systolic heart failure (HCC) 09/14/2014   Ventricular fibrillation (HCC) 09/11/2014   Sinus bradycardia 09/11/2014   Cardiomyopathy (HCC) 09/11/2014   Acute renal failure (HCC) 09/08/2014   Crohn's duodenitis (HCC) 09/08/2014   Prolonged QT interval 09/08/2014   Anemia 09/08/2014   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   Cardiac arrest (HCC) 08/31/2014   Hypokalemia 08/31/2014   Acute respiratory failure with hypoxia (HCC) 08/31/2014   Pancytopenia (HCC) 11/23/2012    PCP: Leonel Cole, MD   REFERRING PROVIDER: Leonce Katz, DO  REFERRING DIAG: [phone] (ICD-10-CM) - Chronic pain of right ankle M79.672 (ICD-10-CM) - Left foot pain M72.2 (ICD-10-CM) - Plantar fasciitis of left foot  THERAPY DIAG:  Plantar fasciitis of left foot  Pain in right ankle and joints of right foot  Muscle weakness (generalized)  Other abnormalities of gait and mobility  Rationale for Evaluation and Treatment: Rehabilitation  ONSET DATE: chronic  SUBJECTIVE:   SUBJECTIVE STATEMENT: R ankle symptoms  began in 11/24 following extensive ambulation across unlevel ground.  Symptoms located medially along posterior tibialis distribution.  Developed L ankle foot symptoms as a compensatory measure with symptoms in L arch.    PERTINENT HISTORY: 1. Chronic pain of right ankle -Chronic with exacerbation, subsequent visit - Resolved flare of right ankle pain  after course of prednisone  and starting HEP - Discontinue prednisone .  Dosepak completed - Continue HEP - Use Tylenol  as needed   2. Left foot pain 3. Plantar fasciitis of left foot -Acute, uncomplicated, initial sports medicine visit - Most consistent with plantar fasciitis of left foot.  Possibly flared due to compensation versus tissue inflammation related to ciprofloxacin course - Start HEP and physical therapy for plantar fasciitis - Start Voltaren gel topically over areas of pain - May use Tylenol  for day-to-day pain relief - Do not recommend NSAID use due to past medical history of Crohn's.  Do not recommend repeat prednisone  course since we completed a prednisone  course last month.   Pertinent previous records reviewed include left foot x-ray from 07/04/2023   Follow Up: 4 weeks for reevaluation.  If no improvement or worsening of symptoms, could consider plantar fasciitis CSI PAIN:  Are you having pain? Yes: NPRS scale: 2/10 B Pain location: L arch, R medial aspect Pain description: ache  Aggravating factors: activity Relieving factors: rest and time  PRECAUTIONS: None  RED FLAGS: None   WEIGHT BEARING RESTRICTIONS: No  FALLS:  Has patient fallen in last 6 months? No  OCCUPATION: not working   PLOF: Independent  PATIENT GOALS: To manage my ankle/foot symptoms  NEXT MD VISIT: 4 weeks  OBJECTIVE:  Note: Objective measures were completed at Evaluation unless otherwise noted.  DIAGNOSTIC FINDINGS: CLINICAL DATA:  Right ankle pain for 3 weeks   EXAM: RIGHT ANKLE - COMPLETE 3+ VIEW   COMPARISON:  None Available.   FINDINGS: Normal alignment without acute osseous finding, fracture, or large joint effusion. No significant arthropathy. Diffuse lower extremity and ankle edema noted.   IMPRESSION: Diffuse lower extremity and ankle edema. No acute osseous finding.     Electronically Signed   By: CHRISTELLA.  Shick M.D.   On: 10/11/2023 19:22  CLINICAL DATA:  Pain  around great toe after a slip and fall last night. Swelling of the foot.   EXAM: LEFT FOOT - COMPLETE 3+ VIEW   COMPARISON:  None Available.   FINDINGS: Dorsal soft tissue swelling along the forefoot. No fracture or acute bony findings.   IMPRESSION: 1. Dorsal soft tissue swelling along the forefoot. No fracture or acute bony findings.     Electronically Signed   By: Ryan Salvage M.D.   On: 07/04/2023 17:54  PATIENT SURVEYS:  FOTO 69(75 predicted)  MUSCLE LENGTH: deferred  POSTURE:  mild pes planus B  PALPATION: L plantar fascial  LOWER EXTREMITY ROM:  A/PROM Right eval Left eval  Hip flexion    Hip extension    Hip abduction    Hip adduction    Hip internal rotation    Hip external rotation    Knee flexion    Knee extension    Ankle dorsiflexion 10/15d 10/15d  Ankle plantarflexion WNL WNL  Ankle inversion WNL WNL  Ankle eversion WNL WNL   (Blank rows = not tested)  LOWER EXTREMITY MMT:  MMT Right eval Left eval  Hip flexion    Hip extension    Hip abduction    Hip adduction    Hip internal rotation  Hip external rotation    Knee flexion    Knee extension    Ankle dorsiflexion    Ankle plantarflexion 4 4  Ankle inversion    Ankle eversion     (Blank rows = not tested)  LOWER EXTREMITY SPECIAL TESTS:  Ankle special tests: Talar tilt test: negative, Homan's test: negative, and Tinel's test-Posterior tibialis: negative  FUNCTIONAL TESTS:  30 seconds chair stand test 10 reps no UE assist  GAIT: Distance walked: 20ft x2 Assistive device utilized: None Level of assistance: Complete Independence Comments: good cadence                                                                                                                                TREATMENT DATE:  11/23/23 Eval and HEP  PATIENT EDUCATION:  Education details: Discussed eval findings, rehab rationale and POC and patient is in agreement  Person educated:  Patient Education method: Explanation Education comprehension: verbalized understanding and needs further education  HOME EXERCISE PROGRAM: Access Code: [email or web address] URL: [email or web address] Date: 11/30/2023 Prepared by: Reyes Kohut  Exercises - Sit to Stand with Counter Support  - 2 x daily - 5 x weekly - 1 sets - 5 reps - Standing Heel Raise with Support  - 2 x daily - 5 x weekly - 2 sets - 10 reps  ASSESSMENT:  CLINICAL IMPRESSION: Patient is a 38 y.o. female who was seen today for physical therapy evaluation and treatment for B foot and ankle pain.  Patient presents with good AROM in B ankles but strength deficits in PF and 30s chair stand test identified.  Special testing failed to identify distinct plantar fascitis.  B pes planus noted and recommended arch supports.    OBJECTIVE IMPAIRMENTS: Abnormal gait, decreased activity tolerance, decreased endurance, decreased knowledge of condition, difficulty walking, decreased strength, increased fascial restrictions, postural dysfunction, and pain.   ACTIVITY LIMITATIONS: carrying, lifting, squatting, and stairs  PERSONAL FACTORS: Fitness and Time since onset of injury/illness/exacerbation are also affecting patient's functional outcome.   REHAB POTENTIAL: Good  CLINICAL DECISION MAKING: Stable/uncomplicated  EVALUATION COMPLEXITY: Low   GOALS: Goals reviewed with patient? No  SHORT TERM GOALS: Target date: 12/14/2023   Patient to demonstrate independence in HEP  Baseline: [email or web address] Goal status: INITIAL  2.  Obtain gel inserts Baseline: TBD Goal status: INITIAL    LONG TERM GOALS: Target date: 12/14/2023  Patient will acknowledge 1/10 pain at least once during episode of care   Baseline: 2/10 Goal status: INITIAL  2.  Patient will score at least 75% on FOTO to signify clinically meaningful improvement in functional abilities.   Baseline: 69% Goal status: INITIAL  3.  Patient will increase 30s chair  stand reps from 10 to 14 with/without arms to demonstrate and improved functional ability with less pain/difficulty as well as reduce fall risk.  Baseline: 10 Goal status: INITIAL  4.  Increase B PF strength to 4+/5 Baseline: 4/5 Goal status:  INITIAL    PLAN:  PT FREQUENCY: 1-2x/week  PT DURATION: 6 weeks  PLANNED INTERVENTIONS: 97164- PT Re-evaluation, 97110-Therapeutic exercises, 97530- Therapeutic activity, 97112- Neuromuscular re-education, 97535- Self Care, [id number]- Manual therapy, [phone]- Gait training, [phone]- Aquatic Therapy, 97014- Electrical stimulation (unattended), Balance training, Stair training, Taping, Dry Needling, Joint mobilization, and Spinal mobilization  PLAN FOR NEXT SESSION: HEP review and update, manual techniques as appropriate, aerobic tasks, ROM and flexibility activities, strengthening and PREs, TPDN, gait and balance training as needed     Reyes CHRISTELLA Kohut, PT 11/30/2023, 8:42 AM

## 2023-11-23 ENCOUNTER — Ambulatory Visit: Payer: 59 | Attending: Sports Medicine

## 2023-11-23 ENCOUNTER — Other Ambulatory Visit: Payer: Self-pay

## 2023-11-23 DIAGNOSIS — M79672 Pain in left foot: Secondary | ICD-10-CM | POA: Diagnosis not present

## 2023-11-23 DIAGNOSIS — M722 Plantar fascial fibromatosis: Secondary | ICD-10-CM

## 2023-11-23 DIAGNOSIS — M6281 Muscle weakness (generalized): Secondary | ICD-10-CM | POA: Diagnosis present

## 2023-11-23 DIAGNOSIS — M25571 Pain in right ankle and joints of right foot: Secondary | ICD-10-CM

## 2023-11-23 DIAGNOSIS — R2689 Other abnormalities of gait and mobility: Secondary | ICD-10-CM | POA: Diagnosis present

## 2023-11-23 DIAGNOSIS — G8929 Other chronic pain: Secondary | ICD-10-CM | POA: Insufficient documentation

## 2023-11-26 ENCOUNTER — Ambulatory Visit (INDEPENDENT_AMBULATORY_CARE_PROVIDER_SITE_OTHER): Payer: 59

## 2023-11-26 DIAGNOSIS — I428 Other cardiomyopathies: Secondary | ICD-10-CM | POA: Diagnosis not present

## 2023-11-26 LAB — CUP PACEART REMOTE DEVICE CHECK
Battery Remaining Longevity: 5 mo
Battery Remaining Percentage: 5 %
Battery Voltage: 2.62 V
Brady Statistic AP VP Percent: 1 %
Brady Statistic AP VS Percent: 23 %
Brady Statistic AS VP Percent: 1 %
Brady Statistic AS VS Percent: 75 %
Brady Statistic RA Percent Paced: 21 %
Brady Statistic RV Percent Paced: 1 %
Date Time Interrogation Session: 20250116020017
HighPow Impedance: 74 Ohm
HighPow Impedance: 74 Ohm
Implantable Lead Connection Status: 753985
Implantable Lead Connection Status: 753985
Implantable Lead Implant Date: 20151104
Implantable Lead Implant Date: 20151104
Implantable Lead Location: 753859
Implantable Lead Location: 753860
Implantable Lead Model: 5076
Implantable Pulse Generator Implant Date: 20151104
Lead Channel Impedance Value: 310 Ohm
Lead Channel Impedance Value: 380 Ohm
Lead Channel Pacing Threshold Amplitude: 0.5 V
Lead Channel Pacing Threshold Amplitude: 1 V
Lead Channel Pacing Threshold Pulse Width: 0.4 ms
Lead Channel Pacing Threshold Pulse Width: 0.4 ms
Lead Channel Sensing Intrinsic Amplitude: 3.5 mV
Lead Channel Sensing Intrinsic Amplitude: 7.7 mV
Lead Channel Setting Pacing Amplitude: 2 V
Lead Channel Setting Pacing Amplitude: 2.5 V
Lead Channel Setting Pacing Pulse Width: 0.4 ms
Lead Channel Setting Sensing Sensitivity: 0.5 mV
Pulse Gen Serial Number: 7179320

## 2023-11-30 NOTE — Therapy (Unsigned)
OUTPATIENT PHYSICAL THERAPY LOWER EXTREMITY EVALUATION   Patient Name: Julie Saunders MRN: [id number] DOB:[birth year], 38 y.o., female Today's Date: 12/01/2023  END OF SESSION:  PT End of Session - 12/01/23 1318     Visit Number 2    Number of Visits 12    Date for PT Re-Evaluation 01/21/24    Authorization Type [employer]    PT Start Time 1315    PT Stop Time 1400    PT Time Calculation (min) 45 min              Past Medical History:  Diagnosis Date   AICD (automatic cardioverter/defibrillator) present 09/14/2015   STJ dual chamber ICD implanted by Dr Graciela Husbands for aborted cardiac arrest   Allergy    Bradycardia    Cardiac arrest (HCC) 08/31/2014   Crohn disease (HCC)    Depression    History of blood transfusion 09/2012   "had allergic rxn to one of my Crohn's RX"    Migraine    "a few times/yr" (06/08/2015)   Pancytopenia (HCC) 11/23/2012   Status post radiofrequency ablation for arrhythmia, atrial tach 06/08/15 06/08/2015   SVT (supraventricular tachycardia) (HCC) 12/08/2014   likely an atrial tachycardia with CL 250 msec   Past Surgical History:  Procedure Laterality Date   : LAPAROSCOPY, SURGICAL; COLECTOMY, PARTIAL, WITH REMOVAL OF TERMINAL ILEUM WITH ILEOCOLOSTOMY 02/06/2022  02/06/2022   ABLATION     ELECTROPHYSIOLOGIC STUDY N/A 06/08/2015   Procedure: SVT Ablation;  Surgeon: Hillis Range, MD;  Location: [facility] INVASIVE CV [facility];  Service: Cardiovascular;  Laterality: N/A;   ELECTROPHYSIOLOGY STUDY N/A 09/11/2014   Procedure: ELECTROPHYSIOLOGY STUDY;  Surgeon: Duke Salvia, MD;  Location: [facility] CATH LAB;  Service: Cardiovascular;  Laterality: N/A;   IMPLANTABLE CARDIOVERTER DEFIBRILLATOR IMPLANT N/A 09/13/2014   Procedure: IMPLANTABLE CARDIOVERTER DEFIBRILLATOR IMPLANT;  Surgeon: Duke Salvia, MD;  Location: [facility] CATH LAB;  Service: Cardiovascular;  Laterality: N/A;; STJ dual chamber ICD implanted by Dr Graciela Husbands for aborted cardiac arrest   Patient Active Problem List   Diagnosis  Date Noted   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   Dual implantable cardioverter-defibrillator in situ 09/22/2018   Crohn's disease of both small and large intestine without complication (HCC) 09/22/2018   Status post radiofrequency ablation for arrhythmia, atrial tach 06/08/15 06/08/2015   SVT (supraventricular tachycardia) (HCC) 04/16/2015   Defibrillator discharge    VT (ventricular tachycardia) (HCC)    Chronic systolic heart failure (HCC) 09/14/2014   Ventricular fibrillation (HCC) 09/11/2014   Sinus bradycardia 09/11/2014   Cardiomyopathy (HCC) 09/11/2014   Acute renal failure (HCC) 09/08/2014   Crohn's duodenitis (HCC) 09/08/2014   Prolonged QT interval 09/08/2014   Anemia 09/08/2014   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   Cardiac arrest (HCC) 08/31/2014   Hypokalemia 08/31/2014   Acute respiratory failure with hypoxia (HCC) 08/31/2014   Pancytopenia (HCC) 11/23/2012    PCP: Irven Coe, MD   REFERRING PROVIDER: Richardean Sale, DO  REFERRING DIAG: [phone] (ICD-10-CM) - Chronic pain of right ankle M79.672 (ICD-10-CM) - Left foot pain M72.2 (ICD-10-CM) - Plantar fasciitis of left foot  THERAPY DIAG:  Plantar fasciitis of left foot  Pain in right ankle and joints of right foot  Muscle weakness (generalized)  Other abnormalities of gait and mobility  Rationale for Evaluation and Treatment: Rehabilitation  ONSET DATE: chronic  SUBJECTIVE:   SUBJECTIVE STATEMENT: R side essentially asymptomatic.  L side symptoms localized to plantar fascia, closer to heel.  PERTINENT HISTORY: 1. Chronic pain  of right ankle -Chronic with exacerbation, subsequent visit - Resolved flare of right ankle pain after course of prednisone and starting HEP - Discontinue prednisone.  Dosepak completed - Continue HEP - Use Tylenol as needed   2. Left foot pain 3. Plantar fasciitis of left foot -Acute, uncomplicated, initial sports medicine visit - Most consistent  with plantar fasciitis of left foot.  Possibly flared due to compensation versus tissue inflammation related to ciprofloxacin course - Start HEP and physical therapy for plantar fasciitis - Start Voltaren gel topically over areas of pain - May use Tylenol for day-to-day pain relief - Do not recommend NSAID use due to past medical history of Crohn's.  Do not recommend repeat prednisone course since we completed a prednisone course last month.   Pertinent previous records reviewed include left foot x-ray from 07/04/2023   Follow Up: 4 weeks for reevaluation.  If no improvement or worsening of symptoms, could consider plantar fasciitis CSI PAIN:  Are you having pain? Yes: NPRS scale: 2/10 B Pain location: L arch, R medial aspect Pain description: ache  Aggravating factors: activity Relieving factors: rest and time  PRECAUTIONS: None  RED FLAGS: None   WEIGHT BEARING RESTRICTIONS: No  FALLS:  Has patient fallen in last 6 months? No  OCCUPATION: not working   PLOF: Independent  PATIENT GOALS: To manage my ankle/foot symptoms  NEXT MD VISIT: 4 weeks  OBJECTIVE:  Note: Objective measures were completed at Evaluation unless otherwise noted.  DIAGNOSTIC FINDINGS: CLINICAL DATA:  Right ankle pain for 3 weeks   EXAM: RIGHT ANKLE - COMPLETE 3+ VIEW   COMPARISON:  None Available.   FINDINGS: Normal alignment without acute osseous finding, fracture, or large joint effusion. No significant arthropathy. Diffuse lower extremity and ankle edema noted.   IMPRESSION: Diffuse lower extremity and ankle edema. No acute osseous finding.     Electronically Signed   By: Judie Petit.  Shick M.D.   On: 10/11/2023 19:22  CLINICAL DATA:  Pain around great toe after a slip and fall last night. Swelling of the foot.   EXAM: LEFT FOOT - COMPLETE 3+ VIEW   COMPARISON:  None Available.   FINDINGS: Dorsal soft tissue swelling along the forefoot. No fracture or acute bony findings.    IMPRESSION: 1. Dorsal soft tissue swelling along the forefoot. No fracture or acute bony findings.     Electronically Signed   By: Gaylyn Rong M.D.   On: 07/04/2023 17:54  PATIENT SURVEYS:  FOTO 69(75 predicted)  MUSCLE LENGTH: deferred  POSTURE:  mild pes planus B  PALPATION: L plantar fascial  LOWER EXTREMITY ROM:  A/PROM Right eval Left eval  Hip flexion    Hip extension    Hip abduction    Hip adduction    Hip internal rotation    Hip external rotation    Knee flexion    Knee extension    Ankle dorsiflexion 10/15d 10/15d  Ankle plantarflexion WNL WNL  Ankle inversion WNL WNL  Ankle eversion WNL WNL   (Blank rows = not tested)  LOWER EXTREMITY MMT:  MMT Right eval Left eval  Hip flexion    Hip extension    Hip abduction    Hip adduction    Hip internal rotation    Hip external rotation    Knee flexion    Knee extension    Ankle dorsiflexion    Ankle plantarflexion 4 4  Ankle inversion    Ankle eversion     (Blank rows =  not tested)  LOWER EXTREMITY SPECIAL TESTS:  Ankle special tests: Talar tilt test: negative, Homan's test: negative, and Tinel's test-Posterior tibialis: negative  FUNCTIONAL TESTS:  30 seconds chair stand test 10 reps no UE assist  GAIT: Distance walked: 13ft x2 Assistive device utilized: None Level of assistance: Complete Independence Comments: good cadence                                                                                                                                TREATMENT DATE:  [facility] Adult PT Treatment:                                                DATE: 12/01/23 Therapeutic Exercise: Nustep L2 8 min L PF stretch with towel 30s x2 Great toe flexion L YTB 15x2 L DF/INV/EV YTB 15x2 ea. Conc/ecc heel raises from 4 in block 15x2   11/23/23 Eval and HEP  PATIENT EDUCATION:  Education details: Discussed eval findings, rehab rationale and POC and patient is in agreement  Person educated:  Patient Education method: Explanation Education comprehension: verbalized understanding and needs further education  HOME EXERCISE PROGRAM: Access Code: [email or web address] URL: [email or web address] Date: 11/30/2023 Prepared by: Gustavus Bryant  Exercises - Sit to Stand with Counter Support  - 2 x daily - 5 x weekly - 1 sets - 5 reps - Standing Heel Raise with Support  - 2 x daily - 5 x weekly - 2 sets - 10 reps  ASSESSMENT:  CLINICAL IMPRESSION: Today's session focused on stretching f/b strengthening in all directions, con/ecc exercises.  Palpation still tender along posterior tibial tendon and muscle belly.  Able to tolerate all tasks with only fatigue reported, no increased pain.  OBJECTIVE IMPAIRMENTS: Abnormal gait, decreased activity tolerance, decreased endurance, decreased knowledge of condition, difficulty walking, decreased strength, increased fascial restrictions, postural dysfunction, and pain.   ACTIVITY LIMITATIONS: carrying, lifting, squatting, and stairs  PERSONAL FACTORS: Fitness and Time since onset of injury/illness/exacerbation are also affecting patient's functional outcome.   REHAB POTENTIAL: Good  CLINICAL DECISION MAKING: Stable/uncomplicated  EVALUATION COMPLEXITY: Low   GOALS: Goals reviewed with patient? No  SHORT TERM GOALS: Target date: 12/14/2023   Patient to demonstrate independence in HEP  Baseline: [email or web address] Goal status: INITIAL  2.  Obtain gel inserts Baseline: TBD Goal status: INITIAL    LONG TERM GOALS: Target date: 12/14/2023  Patient will acknowledge 1/10 pain at least once during episode of care   Baseline: 2/10 Goal status: INITIAL  2.  Patient will score at least 75% on FOTO to signify clinically meaningful improvement in functional abilities.   Baseline: 69% Goal status: INITIAL  3.  Patient will increase 30s chair stand reps from 10 to 14 with/without arms to demonstrate and improved functional ability with less  pain/difficulty as well as reduce  fall risk.  Baseline: 10 Goal status: INITIAL  4.  Increase B PF strength to 4+/5 Baseline: 4/5 Goal status: INITIAL    PLAN:  PT FREQUENCY: 1-2x/week  PT DURATION: 6 weeks  PLANNED INTERVENTIONS: 97164- PT Re-evaluation, 97110-Therapeutic exercises, 97530- Therapeutic activity, 97112- Neuromuscular re-education, 97535- Self Care, 25366- Manual therapy, [phone]- Gait training, [phone]- Aquatic Therapy, 97014- Electrical stimulation (unattended), Balance training, Stair training, Taping, Dry Needling, Joint mobilization, and Spinal mobilization  PLAN FOR NEXT SESSION: HEP review and update, manual techniques as appropriate, aerobic tasks, ROM and flexibility activities, strengthening and PREs, TPDN, gait and balance training as needed     Hildred Laser, PT 12/01/2023, 1:59 PM

## 2023-12-01 ENCOUNTER — Ambulatory Visit: Payer: 59

## 2023-12-01 DIAGNOSIS — M25571 Pain in right ankle and joints of right foot: Secondary | ICD-10-CM

## 2023-12-01 DIAGNOSIS — M722 Plantar fascial fibromatosis: Secondary | ICD-10-CM | POA: Diagnosis not present

## 2023-12-01 DIAGNOSIS — R2689 Other abnormalities of gait and mobility: Secondary | ICD-10-CM

## 2023-12-01 DIAGNOSIS — M6281 Muscle weakness (generalized): Secondary | ICD-10-CM

## 2023-12-01 NOTE — Progress Notes (Unsigned)
    Aleen Sells D.[name] [address] [place] [address] Phone: [phone]   Assessment and Plan:     There are no diagnoses linked to this encounter.  ***   Pertinent previous records reviewed include ***    Follow Up: ***     Subjective:   I, Julie Saunders, am serving as a Neurosurgeon for Doctor Richardean Sale   Chief Complaint: right ankle pain    HPI:    10/02/2023 Patient is a 38 year old female with concerns of right ankle pain. Patient states that she was walking on uneven ground. Pain for about 2-3 weeks. Tylenol for the pain and helps minimal. Pain radiates up the leg and around the foot. RICES and uses a brace    10/30/2023 Patient states right ankle has improved but now her left ankle/ foot is in pain   12/02/2023 Patient states   Relevant Historical Information: crohns disease  Additional pertinent review of systems negative.   Current Outpatient Medications:    acetaminophen (TYLENOL) 325 MG tablet, Take 650 mg by mouth every 6 (six) hours as needed for headache., Disp: , Rfl:    cetirizine (ZYRTEC) 10 MG tablet, Take 10 mg by mouth daily., Disp: , Rfl:    Cholecalciferol (VITAMIN D3) 50 MCG (2000 UT) CHEW, Chew by mouth daily., Disp: , Rfl:    Cobalamin Combinations (B-12) [phone] MCG SUBL, Place 1 tablet under the tongue daily., Disp: , Rfl:    colestipol (COLESTID) 1 g tablet, Take 1 g by mouth as needed., Disp: , Rfl:    diazepam (VALIUM) 5 MG tablet, TAKE 1 TABLET (5 MG TOTAL) BY MOUTH EVERY 12 (TWELVE) HOURS AS NEEDED (VERTIGPO)., Disp: 10 tablet, Rfl: 0   Galcanezumab-gnlm (EMGALITY) 120 MG/ML SOAJ, Inject 120 mg into the skin every 30 (thirty) days., Disp: 1 mL, Rfl: 11   losartan (COZAAR) 25 MG tablet, Take 1 tablet (25 mg total) by mouth daily., Disp: 90 tablet, Rfl: 3   ondansetron (ZOFRAN) 4 MG tablet, Take 4 mg by mouth as needed for nausea or vomiting., Disp: , Rfl:    Pediatric Multiple Vit-C-FA  (ANIMAL CHEWABLE MULTIVITAMIN PO), Take 1 tablet by mouth daily., Disp: , Rfl:    predniSONE (DELTASONE) 10 MG tablet, Take one tablet daily for the next 5 days., Disp: 10 tablet, Rfl: 0   SKYRIZI 360 MG/2.4ML SOCT, Inject into the skin every 8 (eight) weeks., Disp: , Rfl:    spironolactone (ALDACTONE) 25 MG tablet, TAKE 1 TABLET (25 MG TOTAL) BY MOUTH DAILY., Disp: 90 tablet, Rfl: 1   Objective:     There were no vitals filed for this visit.    There is no height or weight on file to calculate BMI.    Physical Exam:    ***   Electronically signed by:  Aleen Sells D.[name] 12:05 PM 12/01/23

## 2023-12-02 ENCOUNTER — Telehealth: Payer: Self-pay | Admitting: Adult Health

## 2023-12-02 ENCOUNTER — Ambulatory Visit: Payer: 59 | Admitting: Sports Medicine

## 2023-12-02 VITALS — BP 122/78 | HR 75 | Ht 65.0 in | Wt 205.0 lb

## 2023-12-02 DIAGNOSIS — M722 Plantar fascial fibromatosis: Secondary | ICD-10-CM | POA: Diagnosis not present

## 2023-12-02 DIAGNOSIS — M79672 Pain in left foot: Secondary | ICD-10-CM | POA: Diagnosis not present

## 2023-12-02 NOTE — Telephone Encounter (Signed)
LVM and sent mychart msg informing pt of need to reschedule 01/19/24 appt - NP schedule change

## 2023-12-02 NOTE — Telephone Encounter (Signed)
Appointment needed to be r/s

## 2023-12-02 NOTE — Patient Instructions (Signed)
Recommend going to [employer] or similar and getting plantar fascitis inserts Continue HEP and PT for plantar fascitis  Tylenol , Voltaren gel over areas of pain  As needed follow if want you want to discuss  injection

## 2023-12-03 ENCOUNTER — Ambulatory Visit: Payer: 59 | Admitting: Sports Medicine

## 2023-12-03 NOTE — Therapy (Signed)
OUTPATIENT PHYSICAL THERAPY TREATMENT NOTE   Patient Name: Julie Saunders MRN: [id number] DOB:January 17, 1986, 38 y.o., female Today's Date: 12/07/2023  END OF SESSION:  PT End of Session - 12/07/23 1611     Visit Number 3    Number of Visits 12    Date for PT Re-Evaluation 01/21/24    Authorization Type UHC    PT Start Time 1615    PT Stop Time 1655    PT Time Calculation (min) 40 min    Activity Tolerance Patient tolerated treatment well    Behavior During Therapy [facility] for tasks assessed/performed               Past Medical History:  Diagnosis Date   AICD (automatic cardioverter/defibrillator) present 09/14/2015   STJ dual chamber ICD implanted by Dr Graciela Husbands for aborted cardiac arrest   Allergy    Bradycardia    Cardiac arrest (HCC) 08/31/2014   Crohn disease (HCC)    Depression    History of blood transfusion 09/2012   "had allergic rxn to one of my Crohn's RX"    Migraine    "a few times/yr" (06/08/2015)   Pancytopenia (HCC) 11/23/2012   Status post radiofrequency ablation for arrhythmia, atrial tach 06/08/15 06/08/2015   SVT (supraventricular tachycardia) (HCC) 12/08/2014   likely an atrial tachycardia with CL 250 msec   Past Surgical History:  Procedure Laterality Date   : LAPAROSCOPY, SURGICAL; COLECTOMY, PARTIAL, WITH REMOVAL OF TERMINAL ILEUM WITH ILEOCOLOSTOMY 02/06/2022  02/06/2022   ABLATION     ELECTROPHYSIOLOGIC STUDY N/A 06/08/2015   Procedure: SVT Ablation;  Surgeon: Hillis Range, MD;  Location: [facility] INVASIVE CV LAB;  Service: Cardiovascular;  Laterality: N/A;   ELECTROPHYSIOLOGY STUDY N/A 09/11/2014   Procedure: ELECTROPHYSIOLOGY STUDY;  Surgeon: Duke Salvia, MD;  Location: [facility] C[facility];  Service: Cardiovascular;  Laterality: N/A;   IMPLANTABLE CARDIOVERTER DEFIBRILLATOR IMPLANT N/A 09/13/2014   Procedure: IMPLANTABLE CARDIOVERTER DEFIBRILLATOR IMPLANT;  Surgeon: Duke Salvia, MD;  Location: 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   Dual implantable cardioverter-defibrillator in situ 09/22/2018   Crohn's disease of both small and large intestine without complication (HCC) 09/22/2018   Status post radiofrequency ablation for arrhythmia, atrial tach 06/08/15 06/08/2015   SVT (supraventricular tachycardia) (HCC) 04/16/2015   Defibrillator discharge    VT (ventricular tachycardia) (HCC)    Chronic systolic heart failure (HCC) 09/14/2014   Ventricular fibrillation (HCC) 09/11/2014   Sinus bradycardia 09/11/2014   Cardiomyopathy (HCC) 09/11/2014   Acute renal failure (HCC) 09/08/2014   Crohn's duodenitis (HCC) 09/08/2014   Prolonged QT interval 09/08/2014   Anemia 09/08/2014   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   Cardiac arrest (HCC) 08/31/2014   Hypokalemia 08/31/2014   Acute respiratory failure with hypoxia (HCC) 08/31/2014   Pancytopenia (HCC) 11/23/2012    PCP: Irven Coe, MD   REFERRING PROVIDER: Richardean Sale, DO  REFERRING DIAG: [phone] (ICD-10-CM) - Chronic pain of right ankle M79.672 (ICD-10-CM) - Left foot pain M72.2 (ICD-10-CM) - Plantar fasciitis of left foot  THERAPY DIAG:  Plantar fasciitis of left foot  Pain in right ankle and joints of right foot  Muscle weakness (generalized)  Rationale for Evaluation and Treatment: Rehabilitation  ONSET DATE: chronic  SUBJECTIVE:   SUBJECTIVE STATEMENT: Overdid it with excessive walking and both calves became  sore/tight.  Symptoms slowly resolving and close to baseline.  PERTINENT HISTORY: 1. Chronic pain of right ankle -Chronic with exacerbation, subsequent visit - Resolved flare of right ankle pain after course of prednisone and starting HEP - Discontinue prednisone.  Dosepak completed - Continue HEP - Use Tylenol as needed   2. Left foot pain 3. Plantar fasciitis  of left foot -Acute, uncomplicated, initial sports medicine visit - Most consistent with plantar fasciitis of left foot.  Possibly flared due to compensation versus tissue inflammation related to ciprofloxacin course - Start HEP and physical therapy for plantar fasciitis - Start Voltaren gel topically over areas of pain - May use Tylenol for day-to-day pain relief - Do not recommend NSAID use due to past medical history of Crohn's.  Do not recommend repeat prednisone course since we completed a prednisone course last month.   Pertinent previous records reviewed include left foot x-ray from 07/04/2023   Follow Up: 4 weeks for reevaluation.  If no improvement or worsening of symptoms, could consider plantar fasciitis CSI PAIN:  Are you having pain? Yes: NPRS scale: 2/10 B Pain location: L arch, R medial aspect Pain description: ache  Aggravating factors: activity Relieving factors: rest and time  PRECAUTIONS: None  RED FLAGS: None   WEIGHT BEARING RESTRICTIONS: No  FALLS:  Has patient fallen in last 6 months? No  OCCUPATION: not working   PLOF: Independent  PATIENT GOALS: To manage my ankle/foot symptoms  NEXT MD VISIT: 4 weeks  OBJECTIVE:  Note: Objective measures were completed at Evaluation unless otherwise noted.  DIAGNOSTIC FINDINGS: CLINICAL DATA:  Right ankle pain for 3 weeks   EXAM: RIGHT ANKLE - COMPLETE 3+ VIEW   COMPARISON:  None Available.   FINDINGS: Normal alignment without acute osseous finding, fracture, or large joint effusion. No significant arthropathy. Diffuse lower extremity and ankle edema noted.   IMPRESSION: Diffuse lower extremity and ankle edema. No acute osseous finding.     Electronically Signed   By: Judie Petit.  Shick M.D.   On: 10/11/2023 19:22  CLINICAL DATA:  Pain around great toe after a slip and fall last night. Swelling of the foot.   EXAM: LEFT FOOT - COMPLETE 3+ VIEW   COMPARISON:  None Available.   FINDINGS: Dorsal soft  tissue swelling along the forefoot. No fracture or acute bony findings.   IMPRESSION: 1. Dorsal soft tissue swelling along the forefoot. No fracture or acute bony findings.     Electronically Signed   By: Gaylyn Rong M.D.   On: 07/04/2023 17:54  PATIENT SURVEYS:  FOTO 69(75 predicted)  MUSCLE LENGTH: deferred  POSTURE:  mild pes planus B  PALPATION: L plantar fascial  LOWER EXTREMITY ROM:  A/PROM Right eval Left eval  Hip flexion    Hip extension    Hip abduction    Hip adduction    Hip internal rotation    Hip external rotation    Knee flexion    Knee extension    Ankle dorsiflexion 10/15d 10/15d  Ankle plantarflexion WNL WNL  Ankle inversion WNL WNL  Ankle eversion WNL WNL   (Blank rows = not tested)  LOWER EXTREMITY MMT:  MMT Right eval Left eval  Hip flexion    Hip extension    Hip abduction    Hip adduction    Hip internal rotation    Hip external rotation    Knee flexion    Knee extension    Ankle dorsiflexion    Ankle plantarflexion 4 4  Ankle inversion    Ankle eversion     (Blank rows = not tested)  LOWER EXTREMITY SPECIAL TESTS:  Ankle special tests: Talar tilt test: negative, Homan's test: negative, and Tinel's test-Posterior tibialis: negative  FUNCTIONAL TESTS:  30 seconds chair stand test 10 reps no UE assist  GAIT: Distance walked: 23ft x2 Assistive device utilized: None Level of assistance: Complete Independence Comments: good cadence                                                                                                                                TREATMENT DATE:  [facility] Adult PT Treatment:                                                DATE: 12/07/23 Therapeutic Exercise: Nustep L3 8 min L PF stretch with towel 30s x2 Great toe flexion L YTB 15x2 L DF/INV/EV RTB 15x2 ea. Conc/ecc heel raises from 4 in block 15x2 Step downs L 4 in 10x Manual Therapy: STM L PT 2 min  [facility] Adult PT Treatment:                                                 DATE: 12/01/23 Therapeutic Exercise: Nustep L2 8 min L PF stretch with towel 30s x2 Great toe flexion L YTB 15x2 L DF/INV/EV YTB 15x2 ea. Conc/ecc heel raises from 4 in block 15x2   11/23/23 Eval and HEP  PATIENT EDUCATION:  Education details: Discussed eval findings, rehab rationale and POC and patient is in agreement  Person educated: Patient Education method: Explanation Education comprehension: verbalized understanding and needs further education  HOME EXERCISE PROGRAM: Access Code: [email or web address] URL: [email or web address] Date: 11/30/2023 Prepared by: Gustavus Bryant  Exercises - Sit to Stand with Counter Support  - 2 x daily - 5 x weekly - 1 sets - 5 reps - Standing Heel Raise with Support  - 2 x daily - 5 x weekly - 2 sets - 10 reps  ASSESSMENT:  CLINICAL IMPRESSION: Increased resistance on aerobic work and resisted ankle exercises.  Focus on stretching primarily to accommodate soreness.  Discussed and demoed anatomy of PT as well as potential benefit of TPDN.  Patient will consider technique for future visits.  ROM remains WNL in B ankles.  OBJECTIVE IMPAIRMENTS: Abnormal gait, decreased activity tolerance, decreased endurance, decreased knowledge of condition, difficulty walking, decreased strength, increased fascial restrictions, postural dysfunction, and pain.   ACTIVITY LIMITATIONS: carrying, lifting, squatting, and stairs  PERSONAL FACTORS: Fitness and Time since onset of injury/illness/exacerbation are also affecting patient's functional outcome.   REHAB POTENTIAL: Good  CLINICAL DECISION MAKING: Stable/uncomplicated  EVALUATION COMPLEXITY: Low  GOALS: Goals reviewed with patient? No  SHORT TERM GOALS: Target date: 12/14/2023   Patient to demonstrate independence in HEP  Baseline: [email or web address] Goal status: INITIAL  2.  Obtain gel inserts Baseline: TBD Goal status: INITIAL    LONG TERM GOALS: Target date:  12/14/2023  Patient will acknowledge 1/10 pain at least once during episode of care   Baseline: 2/10 Goal status: INITIAL  2.  Patient will score at least 75% on FOTO to signify clinically meaningful improvement in functional abilities.   Baseline: 69% Goal status: INITIAL  3.  Patient will increase 30s chair stand reps from 10 to 14 with/without arms to demonstrate and improved functional ability with less pain/difficulty as well as reduce fall risk.  Baseline: 10 Goal status: INITIAL  4.  Increase B PF strength to 4+/5 Baseline: 4/5 Goal status: INITIAL    PLAN:  PT FREQUENCY: 1-2x/week  PT DURATION: 6 weeks  PLANNED INTERVENTIONS: 97164- PT Re-evaluation, 97110-Therapeutic exercises, 97530- Therapeutic activity, 97112- Neuromuscular re-education, 97535- Self Care, 60454- Manual therapy, [phone]- Gait training, [phone]- Aquatic Therapy, 97014- Electrical stimulation (unattended), Balance training, Stair training, Taping, Dry Needling, Joint mobilization, and Spinal mobilization  PLAN FOR NEXT SESSION: HEP review and update, manual techniques as appropriate, aerobic tasks, ROM and flexibility activities, strengthening and PREs, TPDN, gait and balance training as needed     Hildred Laser, PT 12/07/2023, 4:55 PM

## 2023-12-07 ENCOUNTER — Ambulatory Visit: Payer: 59

## 2023-12-07 DIAGNOSIS — M6281 Muscle weakness (generalized): Secondary | ICD-10-CM

## 2023-12-07 DIAGNOSIS — M722 Plantar fascial fibromatosis: Secondary | ICD-10-CM | POA: Diagnosis not present

## 2023-12-07 DIAGNOSIS — M25571 Pain in right ankle and joints of right foot: Secondary | ICD-10-CM

## 2023-12-07 NOTE — Patient Instructions (Signed)

## 2023-12-14 NOTE — Therapy (Signed)
 OUTPATIENT PHYSICAL THERAPY TREATMENT NOTE   Patient Name: Julie Saunders MRN: [id number] DOB:[birth year], 38 y.o., female Today's Date: 12/15/2023  END OF SESSION:  PT End of Session - 12/15/23 1538     Visit Number 4    Number of Visits 12    Date for PT Re-Evaluation 01/21/24    Authorization Type UHC    PT Start Time 1538    PT Stop Time 1616    PT Time Calculation (min) 38 min    Activity Tolerance Patient tolerated treatment well    Behavior During Therapy [facility] for tasks assessed/performed                Past Medical History:  Diagnosis Date   AICD (automatic cardioverter/defibrillator) present 09/14/2015   STJ dual chamber ICD implanted by Dr Fernande for aborted cardiac arrest   Allergy    Bradycardia    Cardiac arrest (HCC) 08/31/2014   Crohn disease (HCC)    Depression    History of blood transfusion 09/2012   had allergic rxn to one of my Crohn's RX    Migraine    a few times/yr (06/08/2015)   Pancytopenia (HCC) 11/23/2012   Status post radiofrequency ablation for arrhythmia, atrial tach 06/08/15 06/08/2015   SVT (supraventricular tachycardia) (HCC) 12/08/2014   likely an atrial tachycardia with CL 250 msec   Past Surgical History:  Procedure Laterality Date   : LAPAROSCOPY, SURGICAL; COLECTOMY, PARTIAL, WITH REMOVAL OF TERMINAL ILEUM WITH ILEOCOLOSTOMY 02/06/2022  02/06/2022   ABLATION     ELECTROPHYSIOLOGIC STUDY N/A 06/08/2015   Procedure: SVT Ablation;  Surgeon: Lynwood Rakers, MD;  Location: [facility] INVASIVE CV LAB;  Service: Cardiovascular;  Laterality: N/A;   ELECTROPHYSIOLOGY STUDY N/A 09/11/2014   Procedure: ELECTROPHYSIOLOGY STUDY;  Surgeon: Elspeth JAYSON Fernande, MD;  Location: [facility] CATH LAB;  Service: Cardiovascular;  Laterality: N/A;   IMPLANTABLE CARDIOVERTER DEFIBRILLATOR IMPLANT N/A 09/13/2014   Procedure: IMPLANTABLE CARDIOVERTER DEFIBRILLATOR IMPLANT;  Surgeon: Elspeth JAYSON Fernande, MD;  Location: [facility] CATH LAB;  Service: Cardiovascular;  Laterality: N/A;; STJ dual  chamber ICD implanted by Dr Fernande for aborted cardiac arrest   Patient Active Problem List   Diagnosis Date Noted   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   Dual implantable cardioverter-defibrillator in situ 09/22/2018   Crohn's disease of both small and large intestine without complication (HCC) 09/22/2018   Status post radiofrequency ablation for arrhythmia, atrial tach 06/08/15 06/08/2015   SVT (supraventricular tachycardia) (HCC) 04/16/2015   Defibrillator discharge    VT (ventricular tachycardia) (HCC)    Chronic systolic heart failure (HCC) 09/14/2014   Ventricular fibrillation (HCC) 09/11/2014   Sinus bradycardia 09/11/2014   Cardiomyopathy (HCC) 09/11/2014   Acute renal failure (HCC) 09/08/2014   Crohn's duodenitis (HCC) 09/08/2014   Prolonged QT interval 09/08/2014   Anemia 09/08/2014   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   Cardiac arrest (HCC) 08/31/2014   Hypokalemia 08/31/2014   Acute respiratory failure with hypoxia (HCC) 08/31/2014   Pancytopenia (HCC) 11/23/2012    PCP: Leonel Cole, MD   REFERRING PROVIDER: Leonce Katz, DO  REFERRING DIAG: [phone] (ICD-10-CM) - Chronic pain of right ankle M79.672 (ICD-10-CM) - Left foot pain M72.2 (ICD-10-CM) - Plantar fasciitis of left foot  THERAPY DIAG:  Plantar fasciitis of left foot  Pain in right ankle and joints of right foot  Muscle weakness (generalized)  Other abnormalities of gait and mobility  Rationale for Evaluation and Treatment: Rehabilitation  ONSET DATE: chronic  SUBJECTIVE:   SUBJECTIVE STATEMENT: Reports  B arch pain 2-3/10, unable to relate aggravating factors.  Not using arch supports but has them(?)  PERTINENT HISTORY: 1. Chronic pain of right ankle -Chronic with exacerbation, subsequent visit - Resolved flare of right ankle pain after course of prednisone  and starting HEP - Discontinue prednisone .  Dosepak completed - Continue HEP - Use Tylenol  as needed   2. Left  foot pain 3. Plantar fasciitis of left foot -Acute, uncomplicated, initial sports medicine visit - Most consistent with plantar fasciitis of left foot.  Possibly flared due to compensation versus tissue inflammation related to ciprofloxacin course - Start HEP and physical therapy for plantar fasciitis - Start Voltaren gel topically over areas of pain - May use Tylenol  for day-to-day pain relief - Do not recommend NSAID use due to past medical history of Crohn's.  Do not recommend repeat prednisone  course since we completed a prednisone  course last month.   Pertinent previous records reviewed include left foot x-ray from 07/04/2023   Follow Up: 4 weeks for reevaluation.  If no improvement or worsening of symptoms, could consider plantar fasciitis CSI PAIN:  Are you having pain? Yes: NPRS scale: 2/10 B Pain location: L arch, R medial aspect Pain description: ache  Aggravating factors: activity Relieving factors: rest and time  PRECAUTIONS: None  RED FLAGS: None   WEIGHT BEARING RESTRICTIONS: No  FALLS:  Has patient fallen in last 6 months? No  OCCUPATION: not working   PLOF: Independent  PATIENT GOALS: To manage my ankle/foot symptoms  NEXT MD VISIT: 4 weeks  OBJECTIVE:  Note: Objective measures were completed at Evaluation unless otherwise noted.  DIAGNOSTIC FINDINGS: CLINICAL DATA:  Right ankle pain for 3 weeks   EXAM: RIGHT ANKLE - COMPLETE 3+ VIEW   COMPARISON:  None Available.   FINDINGS: Normal alignment without acute osseous finding, fracture, or large joint effusion. No significant arthropathy. Diffuse lower extremity and ankle edema noted.   IMPRESSION: Diffuse lower extremity and ankle edema. No acute osseous finding.     Electronically Signed   By: CHRISTELLA.  Shick M.D.   On: 10/11/2023 19:22  CLINICAL DATA:  Pain around great toe after a slip and fall last night. Swelling of the foot.   EXAM: LEFT FOOT - COMPLETE 3+ VIEW   COMPARISON:  None  Available.   FINDINGS: Dorsal soft tissue swelling along the forefoot. No fracture or acute bony findings.   IMPRESSION: 1. Dorsal soft tissue swelling along the forefoot. No fracture or acute bony findings.     Electronically Signed   By: Ryan Salvage M.D.   On: 07/04/2023 17:54  PATIENT SURVEYS:  FOTO 69(75 predicted)  MUSCLE LENGTH: deferred  POSTURE:  mild pes planus B  PALPATION: L plantar fascial  LOWER EXTREMITY ROM:  A/PROM Right eval Left eval  Hip flexion    Hip extension    Hip abduction    Hip adduction    Hip internal rotation    Hip external rotation    Knee flexion    Knee extension    Ankle dorsiflexion 10/15d 10/15d  Ankle plantarflexion WNL WNL  Ankle inversion WNL WNL  Ankle eversion WNL WNL   (Blank rows = not tested)  LOWER EXTREMITY MMT:  MMT Right eval Left eval  Hip flexion    Hip extension    Hip abduction    Hip adduction    Hip internal rotation    Hip external rotation    Knee flexion    Knee extension    Ankle  dorsiflexion    Ankle plantarflexion 4 4  Ankle inversion    Ankle eversion     (Blank rows = not tested)  LOWER EXTREMITY SPECIAL TESTS:  Ankle special tests: Talar tilt test: negative, Homan's test: negative, and Tinel's test-Posterior tibialis: negative  FUNCTIONAL TESTS:  30 seconds chair stand test 10 reps no UE assist  GAIT: Distance walked: 64ft x2 Assistive device utilized: None Level of assistance: Complete Independence Comments: good cadence                                                                                                                                TREATMENT DATE:  [facility] Adult PT Treatment:                                                DATE: 12/15/23 Therapeutic Exercise: Nustep L4 8 min Neuromuscular re-ed: Step tap and WS onto 8 in block from Airex pad 15x B 4 in lateral steps 15x B 4 in A/P stepping 15x B B heel raises with tennis ball squeeze 15x2 Runners step  4 in block 5# KB Therapeutic Activity: Conc/ecc heel raises from 4 in block 15x2 Great toe flexion B RTB 15x2 B INV/EV using 4# DB on towel  [facility] Adult PT Treatment:                                                DATE: 12/07/23 Therapeutic Exercise: Nustep L3 8 min L PF stretch with towel 30s x2 Great toe flexion L YTB 15x2 L DF/INV/EV RTB 15x2 ea. Conc/ecc heel raises from 4 in block 15x2 Step downs L 4 in 10x Manual Therapy: STM L PT 2 min  [facility] Adult PT Treatment:                                                DATE: 12/01/23 Therapeutic Exercise: Nustep L2 8 min L PF stretch with towel 30s x2 Great toe flexion L YTB 15x2 L DF/INV/EV YTB 15x2 ea. Conc/ecc heel raises from 4 in block 15x2   11/23/23 Eval and HEP  PATIENT EDUCATION:  Education details: Discussed eval findings, rehab rationale and POC and patient is in agreement  Person educated: Patient Education method: Explanation Education comprehension: verbalized understanding and needs further education  HOME EXERCISE PROGRAM: Access Code: [email or web address] URL: [email or web address] Date: 11/30/2023 Prepared by: Reyes Kohut  Exercises - Sit to Stand with Counter Support  - 2 x daily - 5 x weekly - 1 sets - 5 reps - Standing Heel Raise  with Support  - 2 x daily - 5 x weekly - 2 sets - 10 reps  ASSESSMENT:  CLINICAL IMPRESSION: Arrives to PT with c/o symptoms in B arches today.  Reports she has obtained arch supports but has not used them yet.  Patient wearing flat shoes today with no discernable cushion or arch support.  Due to B symptoms today, treatment time split b/t each side limiting total interventions.  Incorporated additional proprioceptive and strengthening tasks.    OBJECTIVE IMPAIRMENTS: Abnormal gait, decreased activity tolerance, decreased endurance, decreased knowledge of condition, difficulty walking, decreased strength, increased fascial restrictions, postural dysfunction, and pain.    ACTIVITY LIMITATIONS: carrying, lifting, squatting, and stairs  PERSONAL FACTORS: Fitness and Time since onset of injury/illness/exacerbation are also affecting patient's functional outcome.   REHAB POTENTIAL: Good  CLINICAL DECISION MAKING: Stable/uncomplicated  EVALUATION COMPLEXITY: Low   GOALS: Goals reviewed with patient? No  SHORT TERM GOALS: Target date: 12/14/2023   Patient to demonstrate independence in HEP  Baseline: [email or web address] Goal status: INITIAL  2.  Obtain gel inserts Baseline: TBD Goal status: INITIAL    LONG TERM GOALS: Target date: 12/14/2023  Patient will acknowledge 1/10 pain at least once during episode of care   Baseline: 2/10 Goal status: INITIAL  2.  Patient will score at least 75% on FOTO to signify clinically meaningful improvement in functional abilities.   Baseline: 69% Goal status: INITIAL  3.  Patient will increase 30s chair stand reps from 10 to 14 with/without arms to demonstrate and improved functional ability with less pain/difficulty as well as reduce fall risk.  Baseline: 10 Goal status: INITIAL  4.  Increase B PF strength to 4+/5 Baseline: 4/5 Goal status: INITIAL    PLAN:  PT FREQUENCY: 1-2x/week  PT DURATION: 6 weeks  PLANNED INTERVENTIONS: 97164- PT Re-evaluation, 97110-Therapeutic exercises, 97530- Therapeutic activity, 97112- Neuromuscular re-education, 97535- Self Care, [id number]- Manual therapy, [phone]- Gait training, [phone]- Aquatic Therapy, 97014- Electrical stimulation (unattended), Balance training, Stair training, Taping, Dry Needling, Joint mobilization, and Spinal mobilization  PLAN FOR NEXT SESSION: HEP review and update, manual techniques as appropriate, aerobic tasks, ROM and flexibility activities, strengthening and PREs, TPDN, gait and balance training as needed     Reyes CHRISTELLA Kohut, PT 12/15/2023, 4:19 PM

## 2023-12-15 ENCOUNTER — Ambulatory Visit: Payer: 59 | Attending: Sports Medicine

## 2023-12-15 DIAGNOSIS — R2689 Other abnormalities of gait and mobility: Secondary | ICD-10-CM | POA: Diagnosis present

## 2023-12-15 DIAGNOSIS — M25571 Pain in right ankle and joints of right foot: Secondary | ICD-10-CM | POA: Diagnosis present

## 2023-12-15 DIAGNOSIS — M722 Plantar fascial fibromatosis: Secondary | ICD-10-CM | POA: Diagnosis present

## 2023-12-15 DIAGNOSIS — M6281 Muscle weakness (generalized): Secondary | ICD-10-CM | POA: Diagnosis present

## 2023-12-22 NOTE — Therapy (Unsigned)
 OUTPATIENT PHYSICAL THERAPY TREATMENT NOTE   Patient Name: SIHAAM CHROBAK MRN: [id number] DOB:08/17/1986, 38 y.o., female Today's Date: 12/23/2023  END OF SESSION:  PT End of Session - 12/23/23 1408     Visit Number 5    Number of Visits 12    Date for PT Re-Evaluation 01/21/24    Authorization Type [employer]    PT Start Time 1410   Late for session   PT Stop Time 1440    PT Time Calculation (min) 30 min    Activity Tolerance Patient tolerated treatment well    Behavior During Therapy [facility] for tasks assessed/performed                 Past Medical History:  Diagnosis Date   AICD (automatic cardioverter/defibrillator) present 09/14/2015   STJ dual chamber ICD implanted by Dr Graciela Husbands for aborted cardiac arrest   Allergy    Bradycardia    Cardiac arrest (HCC) 08/31/2014   Crohn disease (HCC)    Depression    History of blood transfusion 09/2012   "had allergic rxn to one of my Crohn's RX"    Migraine    "a few times/yr" (06/08/2015)   Pancytopenia (HCC) 11/23/2012   Status post radiofrequency ablation for arrhythmia, atrial tach 06/08/15 06/08/2015   SVT (supraventricular tachycardia) (HCC) 12/08/2014   likely an atrial tachycardia with CL 250 msec   Past Surgical History:  Procedure Laterality Date   : LAPAROSCOPY, SURGICAL; COLECTOMY, PARTIAL, WITH REMOVAL OF TERMINAL ILEUM WITH ILEOCOLOSTOMY 02/06/2022  02/06/2022   ABLATION     ELECTROPHYSIOLOGIC STUDY N/A 06/08/2015   Procedure: SVT Ablation;  Surgeon: Hillis Range, MD;  Location: [facility] INVASIVE CV LAB;  Service: Cardiovascular;  Laterality: N/A;   ELECTROPHYSIOLOGY STUDY N/A 09/11/2014   Procedure: ELECTROPHYSIOLOGY STUDY;  Surgeon: Duke Salvia, MD;  Location: [facility] CATH LAB;  Service: Cardiovascular;  Laterality: N/A;   IMPLANTABLE CARDIOVERTER DEFIBRILLATOR IMPLANT N/A 09/13/2014   Procedure: IMPLANTABLE CARDIOVERTER DEFIBRILLATOR IMPLANT;  Surgeon: Duke Salvia, MD;  Location: [facility] CATH LAB;  Service: Cardiovascular;   Laterality: N/A;; STJ dual chamber ICD implanted by Dr Graciela Husbands for aborted cardiac arrest   Patient Active Problem List   Diagnosis Date Noted   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   Dual implantable cardioverter-defibrillator in situ 09/22/2018   Crohn's disease of both small and large intestine without complication (HCC) 09/22/2018   Status post radiofrequency ablation for arrhythmia, atrial tach 06/08/15 06/08/2015   SVT (supraventricular tachycardia) (HCC) 04/16/2015   Defibrillator discharge    VT (ventricular tachycardia) (HCC)    Chronic systolic heart failure (HCC) 09/14/2014   Ventricular fibrillation (HCC) 09/11/2014   Sinus bradycardia 09/11/2014   Cardiomyopathy (HCC) 09/11/2014   Acute renal failure (HCC) 09/08/2014   Crohn's duodenitis (HCC) 09/08/2014   Prolonged QT interval 09/08/2014   Anemia 09/08/2014   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   Cardiac arrest (HCC) 08/31/2014   Hypokalemia 08/31/2014   Acute respiratory failure with hypoxia (HCC) 08/31/2014   Pancytopenia (HCC) 11/23/2012    PCP: Irven Coe, MD   REFERRING PROVIDER: Richardean Sale, DO  REFERRING DIAG: [phone] (ICD-10-CM) - Chronic pain of right ankle M79.672 (ICD-10-CM) - Left foot pain M72.2 (ICD-10-CM) - Plantar fasciitis of left foot  THERAPY DIAG:  Plantar fasciitis of left foot  Pain in right ankle and joints of right foot  Muscle weakness (generalized)  Other abnormalities of gait and mobility  Rationale for Evaluation and Treatment: Rehabilitation  ONSET DATE: chronic  SUBJECTIVE:  SUBJECTIVE STATEMENT: No symptoms to note in R side, L calf discomfort reported.  Has obtained more supportive shoes and has noted a decrease in symptoms  PERTINENT HISTORY: 1. Chronic pain of right ankle -Chronic with exacerbation, subsequent visit - Resolved flare of right ankle pain after course of prednisone and starting HEP - Discontinue prednisone.  Dosepak completed -  Continue HEP - Use Tylenol as needed   2. Left foot pain 3. Plantar fasciitis of left foot -Acute, uncomplicated, initial sports medicine visit - Most consistent with plantar fasciitis of left foot.  Possibly flared due to compensation versus tissue inflammation related to ciprofloxacin course - Start HEP and physical therapy for plantar fasciitis - Start Voltaren gel topically over areas of pain - May use Tylenol for day-to-day pain relief - Do not recommend NSAID use due to past medical history of Crohn's.  Do not recommend repeat prednisone course since we completed a prednisone course last month.   Pertinent previous records reviewed include left foot x-ray from 07/04/2023   Follow Up: 4 weeks for reevaluation.  If no improvement or worsening of symptoms, could consider plantar fasciitis CSI PAIN:  Are you having pain? Yes: NPRS scale: 2/10 B Pain location: L arch, R medial aspect Pain description: ache  Aggravating factors: activity Relieving factors: rest and time  PRECAUTIONS: None  RED FLAGS: None   WEIGHT BEARING RESTRICTIONS: No  FALLS:  Has patient fallen in last 6 months? No  OCCUPATION: not working   PLOF: Independent  PATIENT GOALS: To manage my ankle/foot symptoms  NEXT MD VISIT: 4 weeks  OBJECTIVE:  Note: Objective measures were completed at Evaluation unless otherwise noted.  DIAGNOSTIC FINDINGS: CLINICAL DATA:  Right ankle pain for 3 weeks   EXAM: RIGHT ANKLE - COMPLETE 3+ VIEW   COMPARISON:  None Available.   FINDINGS: Normal alignment without acute osseous finding, fracture, or large joint effusion. No significant arthropathy. Diffuse lower extremity and ankle edema noted.   IMPRESSION: Diffuse lower extremity and ankle edema. No acute osseous finding.     Electronically Signed   By: Judie Petit.  Shick M.D.   On: 10/11/2023 19:22  CLINICAL DATA:  Pain around great toe after a slip and fall last night. Swelling of the foot.   EXAM: LEFT FOOT  - COMPLETE 3+ VIEW   COMPARISON:  None Available.   FINDINGS: Dorsal soft tissue swelling along the forefoot. No fracture or acute bony findings.   IMPRESSION: 1. Dorsal soft tissue swelling along the forefoot. No fracture or acute bony findings.     Electronically Signed   By: Gaylyn Rong M.D.   On: 07/04/2023 17:54  PATIENT SURVEYS:  FOTO 69(75 predicted)  MUSCLE LENGTH: deferred  POSTURE:  mild pes planus B  PALPATION: L plantar fascial  LOWER EXTREMITY ROM:  A/PROM Right eval Left eval  Hip flexion    Hip extension    Hip abduction    Hip adduction    Hip internal rotation    Hip external rotation    Knee flexion    Knee extension    Ankle dorsiflexion 10/15d 10/15d  Ankle plantarflexion WNL WNL  Ankle inversion WNL WNL  Ankle eversion WNL WNL   (Blank rows = not tested)  LOWER EXTREMITY MMT:  MMT Right eval Left eval  Hip flexion    Hip extension    Hip abduction    Hip adduction    Hip internal rotation    Hip external rotation    Knee flexion  Knee extension    Ankle dorsiflexion    Ankle plantarflexion 4 4  Ankle inversion    Ankle eversion     (Blank rows = not tested)  LOWER EXTREMITY SPECIAL TESTS:  Ankle special tests: Talar tilt test: negative, Homan's test: negative, and Tinel's test-Posterior tibialis: negative  FUNCTIONAL TESTS:  30 seconds chair stand test 10 reps no UE assist  GAIT: Distance walked: 56ft x2 Assistive device utilized: None Level of assistance: Complete Independence Comments: good cadence                                                                                                                                TREATMENT DATE:  [facility] Adult PT Treatment:                                                DATE: 12/23/23 Therapeutic Exercise: Nustep L5 6 min L gastroc stretch 30s x2 Manual Therapy: L subtalar mobs 5x10 M/L/distraction Neuromuscular re-ed: L Rearfoot on Airex with 5# KB swing 60s  A/P, 60s M/L L step downs 2 in 10x f/p 4 in 10x Heel raises from 4 in step 15x focus on eccentric  [facility] Adult PT Treatment:                                                DATE: 12/15/23 Therapeutic Exercise: Nustep L4 8 min Neuromuscular re-ed: Step tap and WS onto 8 in block from Airex pad 15x B 4 in lateral steps 15x B 4 in A/P stepping 15x B B heel raises with tennis ball squeeze 15x2 Runners step 4 in block 5# KB Therapeutic Activity: Conc/ecc heel raises from 4 in block 15x2 Great toe flexion B RTB 15x2 B INV/EV using 4# DB on towel  [facility] Adult PT Treatment:                                                DATE: 12/07/23 Therapeutic Exercise: Nustep L3 8 min L PF stretch with towel 30s x2 Great toe flexion L YTB 15x2 L DF/INV/EV RTB 15x2 ea. Conc/ecc heel raises from 4 in block 15x2 Step downs L 4 in 10x Manual Therapy: STM L PT 2 min  [facility] Adult PT Treatment:                                                DATE: 12/01/23 Therapeutic Exercise:  Nustep L2 8 min L PF stretch with towel 30s x2 Great toe flexion L YTB 15x2 L DF/INV/EV YTB 15x2 ea. Conc/ecc heel raises from 4 in block 15x2   11/23/23 Eval and HEP  PATIENT EDUCATION:  Education details: Discussed eval findings, rehab rationale and POC and patient is in agreement  Person educated: Patient Education method: Explanation Education comprehension: verbalized understanding and needs further education  HOME EXERCISE PROGRAM: Access Code: [email or web address] URL: [email or web address] Date: 11/30/2023 Prepared by: Gustavus Bryant  Exercises - Sit to Stand with Counter Support  - 2 x daily - 5 x weekly - 1 sets - 5 reps - Standing Heel Raise with Support  - 2 x daily - 5 x weekly - 2 sets - 10 reps  ASSESSMENT:  CLINICAL IMPRESSION: Patient late for session and treatment abbreviated accordingly. Palpation finds tenderness to B gastroc heads with discomfort with stretch, not felt with soleus stretch.  Added  subtalar mobs to L ankle to relieve stress on PT tendon and improve ankle mobility.  Patient feels ready for independent management and will return in 2 weeks to assess for DC  OBJECTIVE IMPAIRMENTS: Abnormal gait, decreased activity tolerance, decreased endurance, decreased knowledge of condition, difficulty walking, decreased strength, increased fascial restrictions, postural dysfunction, and pain.   ACTIVITY LIMITATIONS: carrying, lifting, squatting, and stairs  PERSONAL FACTORS: Fitness and Time since onset of injury/illness/exacerbation are also affecting patient's functional outcome.   REHAB POTENTIAL: Good  CLINICAL DECISION MAKING: Stable/uncomplicated  EVALUATION COMPLEXITY: Low   GOALS: Goals reviewed with patient? No  SHORT TERM GOALS: Target date: 12/14/2023   Patient to demonstrate independence in HEP  Baseline: [email or web address] Goal status: INITIAL  2.  Obtain gel inserts Baseline: TBD; 12/23/23 New shoes Goal status: Met    LONG TERM GOALS: Target date: 12/14/2023  Patient will acknowledge 1/10 pain at least once during episode of care   Baseline: 2/10 Goal status: INITIAL  2.  Patient will score at least 75% on FOTO to signify clinically meaningful improvement in functional abilities.   Baseline: 69% Goal status: INITIAL  3.  Patient will increase 30s chair stand reps from 10 to 14 with/without arms to demonstrate and improved functional ability with less pain/difficulty as well as reduce fall risk.  Baseline: 10 Goal status: INITIAL  4.  Increase B PF strength to 4+/5 Baseline: 4/5 Goal status: INITIAL    PLAN:  PT FREQUENCY: 1-2x/week  PT DURATION: 6 weeks  PLANNED INTERVENTIONS: 97164- PT Re-evaluation, 97110-Therapeutic exercises, 97530- Therapeutic activity, 97112- Neuromuscular re-education, 97535- Self Care, 16109- Manual therapy, [phone]- Gait training, [phone]- Aquatic Therapy, 97014- Electrical stimulation (unattended), Balance training, Stair  training, Taping, Dry Needling, Joint mobilization, and Spinal mobilization  PLAN FOR NEXT SESSION: HEP review and update, manual techniques as appropriate, aerobic tasks, ROM and flexibility activities, strengthening and PREs, TPDN, gait and balance training as needed     Hildred Laser, PT 12/23/2023, 4:34 PM

## 2023-12-23 ENCOUNTER — Ambulatory Visit: Payer: 59

## 2023-12-23 DIAGNOSIS — M6281 Muscle weakness (generalized): Secondary | ICD-10-CM

## 2023-12-23 DIAGNOSIS — M25571 Pain in right ankle and joints of right foot: Secondary | ICD-10-CM

## 2023-12-23 DIAGNOSIS — R2689 Other abnormalities of gait and mobility: Secondary | ICD-10-CM

## 2023-12-23 DIAGNOSIS — M722 Plantar fascial fibromatosis: Secondary | ICD-10-CM | POA: Diagnosis not present

## 2023-12-28 ENCOUNTER — Ambulatory Visit (INDEPENDENT_AMBULATORY_CARE_PROVIDER_SITE_OTHER): Payer: 59

## 2023-12-28 DIAGNOSIS — I5022 Chronic systolic (congestive) heart failure: Secondary | ICD-10-CM

## 2023-12-28 DIAGNOSIS — I429 Cardiomyopathy, unspecified: Secondary | ICD-10-CM

## 2023-12-28 LAB — CUP PACEART REMOTE DEVICE CHECK
Battery Remaining Longevity: 4 mo
Battery Remaining Percentage: 4 %
Battery Voltage: 2.62 V
Brady Statistic AP VP Percent: 1 %
Brady Statistic AP VS Percent: 28 %
Brady Statistic AS VP Percent: 1 %
Brady Statistic AS VS Percent: 70 %
Brady Statistic RA Percent Paced: 25 %
Brady Statistic RV Percent Paced: 1 %
Date Time Interrogation Session: 20250217020017
HighPow Impedance: 74 Ohm
HighPow Impedance: 74 Ohm
Implantable Lead Connection Status: 753985
Implantable Lead Connection Status: 753985
Implantable Lead Implant Date: 20151104
Implantable Lead Implant Date: 20151104
Implantable Lead Location: 753859
Implantable Lead Location: 753860
Implantable Lead Model: 5076
Implantable Pulse Generator Implant Date: 20151104
Lead Channel Impedance Value: 310 Ohm
Lead Channel Impedance Value: 380 Ohm
Lead Channel Pacing Threshold Amplitude: 0.5 V
Lead Channel Pacing Threshold Amplitude: 1 V
Lead Channel Pacing Threshold Pulse Width: 0.4 ms
Lead Channel Pacing Threshold Pulse Width: 0.4 ms
Lead Channel Sensing Intrinsic Amplitude: 4.3 mV
Lead Channel Sensing Intrinsic Amplitude: 7.3 mV
Lead Channel Setting Pacing Amplitude: 2 V
Lead Channel Setting Pacing Amplitude: 2.5 V
Lead Channel Setting Pacing Pulse Width: 0.4 ms
Lead Channel Setting Sensing Sensitivity: 0.5 mV
Pulse Gen Serial Number: 7179320

## 2023-12-29 ENCOUNTER — Other Ambulatory Visit (HOSPITAL_COMMUNITY): Payer: Self-pay | Admitting: Internal Medicine

## 2024-01-04 NOTE — Progress Notes (Signed)
 Remote ICD transmission.

## 2024-01-06 ENCOUNTER — Encounter: Payer: Self-pay | Admitting: Internal Medicine

## 2024-01-07 NOTE — Therapy (Signed)
 OUTPATIENT PHYSICAL THERAPY TREATMENT NOTE   Patient Name: Julie Saunders MRN: [id number] DOB:06-17-1986, 38 y.o., female Today's Date: 01/11/2024  END OF SESSION:  PT End of Session - 01/11/24 1409     Visit Number 6    Number of Visits 12    Date for PT Re-Evaluation 01/21/24    Authorization Type [employer]    PT Start Time 1405    PT Stop Time 1445    PT Time Calculation (min) 40 min    Activity Tolerance Patient tolerated treatment well    Behavior During Therapy [facility] for tasks assessed/performed                  Past Medical History:  Diagnosis Date   AICD (automatic cardioverter/defibrillator) present 09/14/2015   STJ dual chamber ICD implanted by Dr Graciela Husbands for aborted cardiac arrest   Allergy    Bradycardia    Cardiac arrest (HCC) 08/31/2014   Crohn disease (HCC)    Depression    History of blood transfusion 09/2012   "had allergic rxn to one of my Crohn's RX"    Migraine    "a few times/yr" (06/08/2015)   Pancytopenia (HCC) 11/23/2012   Status post radiofrequency ablation for arrhythmia, atrial tach 06/08/15 06/08/2015   SVT (supraventricular tachycardia) (HCC) 12/08/2014   likely an atrial tachycardia with CL 250 msec   Past Surgical History:  Procedure Laterality Date   : LAPAROSCOPY, SURGICAL; COLECTOMY, PARTIAL, WITH REMOVAL OF TERMINAL ILEUM WITH ILEOCOLOSTOMY 02/06/2022  02/06/2022   ABLATION     ELECTROPHYSIOLOGIC STUDY N/A 06/08/2015   Procedure: SVT Ablation;  Surgeon: Hillis Range, MD;  Location: [facility] INVASIVE CV LAB;  Service: Cardiovascular;  Laterality: N/A;   ELECTROPHYSIOLOGY STUDY N/A 09/11/2014   Procedure: ELECTROPHYSIOLOGY STUDY;  Surgeon: Duke Salvia, MD;  Location: [facility] CATH LAB;  Service: Cardiovascular;  Laterality: N/A;   IMPLANTABLE CARDIOVERTER DEFIBRILLATOR IMPLANT N/A 09/13/2014   Procedure: IMPLANTABLE CARDIOVERTER DEFIBRILLATOR IMPLANT;  Surgeon: Duke Salvia, MD;  Location: [facility] CATH LAB;  Service: Cardiovascular;  Laterality: N/A;; STJ  dual chamber ICD implanted by Dr Graciela Husbands for aborted cardiac arrest   Patient Active Problem List   Diagnosis Date Noted   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   Dual implantable cardioverter-defibrillator in situ 09/22/2018   Crohn's disease of both small and large intestine without complication (HCC) 09/22/2018   Status post radiofrequency ablation for arrhythmia, atrial tach 06/08/15 06/08/2015   SVT (supraventricular tachycardia) (HCC) 04/16/2015   Defibrillator discharge    VT (ventricular tachycardia) (HCC)    Chronic systolic heart failure (HCC) 09/14/2014   Ventricular fibrillation (HCC) 09/11/2014   Sinus bradycardia 09/11/2014   Cardiomyopathy (HCC) 09/11/2014   Acute renal failure (HCC) 09/08/2014   Crohn's duodenitis (HCC) 09/08/2014   Prolonged QT interval 09/08/2014   Anemia 09/08/2014   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   Cardiac arrest (HCC) 08/31/2014   Hypokalemia 08/31/2014   Acute respiratory failure with hypoxia (HCC) 08/31/2014   Pancytopenia (HCC) 11/23/2012    PCP: Irven Coe, MD   REFERRING PROVIDER: Richardean Sale, DO  REFERRING DIAG: [phone] (ICD-10-CM) - Chronic pain of right ankle M79.672 (ICD-10-CM) - Left foot pain M72.2 (ICD-10-CM) - Plantar fasciitis of left foot  THERAPY DIAG:  Plantar fasciitis of left foot  Pain in right ankle and joints of right foot  Rationale for Evaluation and Treatment: Rehabilitation  ONSET DATE: chronic  SUBJECTIVE:   SUBJECTIVE STATEMENT: Still notes B symptoms, localized to calf region and along  post tibialis tendon distribution.  Rates herself at 80%  PERTINENT HISTORY: 1. Chronic pain of right ankle -Chronic with exacerbation, subsequent visit - Resolved flare of right ankle pain after course of prednisone and starting HEP - Discontinue prednisone.  Dosepak completed - Continue HEP - Use Tylenol as needed   2. Left foot pain 3. Plantar fasciitis of left foot -Acute,  uncomplicated, initial sports medicine visit - Most consistent with plantar fasciitis of left foot.  Possibly flared due to compensation versus tissue inflammation related to ciprofloxacin course - Start HEP and physical therapy for plantar fasciitis - Start Voltaren gel topically over areas of pain - May use Tylenol for day-to-day pain relief - Do not recommend NSAID use due to past medical history of Crohn's.  Do not recommend repeat prednisone course since we completed a prednisone course last month.   Pertinent previous records reviewed include left foot x-ray from 07/04/2023   Follow Up: 4 weeks for reevaluation.  If no improvement or worsening of symptoms, could consider plantar fasciitis CSI PAIN:  Are you having pain? Yes: NPRS scale: 2/10 B Pain location: L arch, R medial aspect Pain description: ache  Aggravating factors: activity Relieving factors: rest and time  PRECAUTIONS: None  RED FLAGS: None   WEIGHT BEARING RESTRICTIONS: No  FALLS:  Has patient fallen in last 6 months? No  OCCUPATION: not working   PLOF: Independent  PATIENT GOALS: To manage my ankle/foot symptoms  NEXT MD VISIT: 4 weeks  OBJECTIVE:  Note: Objective measures were completed at Evaluation unless otherwise noted.  DIAGNOSTIC FINDINGS: CLINICAL DATA:  Right ankle pain for 3 weeks   EXAM: RIGHT ANKLE - COMPLETE 3+ VIEW   COMPARISON:  None Available.   FINDINGS: Normal alignment without acute osseous finding, fracture, or large joint effusion. No significant arthropathy. Diffuse lower extremity and ankle edema noted.   IMPRESSION: Diffuse lower extremity and ankle edema. No acute osseous finding.     Electronically Signed   By: Judie Petit.  Shick M.D.   On: 10/11/2023 19:22  CLINICAL DATA:  Pain around great toe after a slip and fall last night. Swelling of the foot.   EXAM: LEFT FOOT - COMPLETE 3+ VIEW   COMPARISON:  None Available.   FINDINGS: Dorsal soft tissue swelling along  the forefoot. No fracture or acute bony findings.   IMPRESSION: 1. Dorsal soft tissue swelling along the forefoot. No fracture or acute bony findings.     Electronically Signed   By: Gaylyn Rong M.D.   On: 07/04/2023 17:54  PATIENT SURVEYS:  FOTO 69(75 predicted) ; 01/11/23 62%  MUSCLE LENGTH: deferred  POSTURE:  mild pes planus B  PALPATION: L plantar fascial  LOWER EXTREMITY ROM:  A/PROM Right eval Left eval B  01/11/24  Hip flexion     Hip extension     Hip abduction     Hip adduction     Hip internal rotation     Hip external rotation     Knee flexion     Knee extension     Ankle dorsiflexion 10/15d 10/15d 15/20d  Ankle plantarflexion WNL WNL   Ankle inversion WNL WNL   Ankle eversion WNL WNL    (Blank rows = not tested)  LOWER EXTREMITY MMT:  MMT Right eval Left eval B 01/11/24  Hip flexion     Hip extension     Hip abduction     Hip adduction     Hip internal rotation  Hip external rotation     Knee flexion     Knee extension     Ankle dorsiflexion     Ankle plantarflexion 4 4 4+  Ankle inversion     Ankle eversion      (Blank rows = not tested)  LOWER EXTREMITY SPECIAL TESTS:  Ankle special tests: Talar tilt test: negative, Homan's test: negative, and Tinel's test-Posterior tibialis: negative 01/11/24 (+) Tarsal tunnel on R SLS 30s B  FUNCTIONAL TESTS:  30 seconds chair stand test 10 reps no UE assist  GAIT: Distance walked: 65ft x2 Assistive device utilized: None Level of assistance: Complete Independence Comments: good cadence                                                                                                                                TREATMENT DATE:  [facility] Adult PT Treatment:                                                DATE: 01/11/24 Re-assessment Self Care: Additional minutes spent for educating on updated Therapeutic Home Exercise Program as well as comparing current status to condition at start of care.  This included exercises focusing on stretching, strengthening, with focus on eccentric aspects. Long term goals include an improvement in range of motion, strength, endurance as well as avoiding reinjury. Patient's frequency would include in 1-2 times a day, 3-5 times a week for a duration of 6-12 weeks. Proper technique shown and discussed handout in great detail. All questions were discussed and addressed.      [facility] Adult PT Treatment:                                                DATE: 12/23/23 Therapeutic Exercise: Nustep L5 6 min L gastroc stretch 30s x2 Manual Therapy: L subtalar mobs 5x10 M/L/distraction Neuromuscular re-ed: L Rearfoot on Airex with 5# KB swing 60s A/P, 60s M/L L step downs 2 in 10x f/p 4 in 10x Heel raises from 4 in step 15x focus on eccentric  [facility] Adult PT Treatment:                                                DATE: 12/15/23 Therapeutic Exercise: Nustep L4 8 min Neuromuscular re-ed: Step tap and WS onto 8 in block from Airex pad 15x B 4 in lateral steps 15x B 4 in A/P stepping 15x B B heel raises with tennis ball squeeze 15x2 Runners step 4 in block 5# KB Therapeutic Activity: Conc/ecc heel  raises from 4 in block 15x2 Great toe flexion B RTB 15x2 B INV/EV using 4# DB on towel  [facility] Adult PT Treatment:                                                DATE: 12/07/23 Therapeutic Exercise: Nustep L3 8 min L PF stretch with towel 30s x2 Great toe flexion L YTB 15x2 L DF/INV/EV RTB 15x2 ea. Conc/ecc heel raises from 4 in block 15x2 Step downs L 4 in 10x Manual Therapy: STM L PT 2 min  [facility] Adult PT Treatment:                                                DATE: 12/01/23 Therapeutic Exercise: Nustep L2 8 min L PF stretch with towel 30s x2 Great toe flexion L YTB 15x2 L DF/INV/EV YTB 15x2 ea. Conc/ecc heel raises from 4 in block 15x2   11/23/23 Eval and HEP  PATIENT EDUCATION:  Education details: Discussed eval findings, rehab rationale and POC and  patient is in agreement  Person educated: Patient Education method: Explanation Education comprehension: verbalized understanding and needs further education  HOME EXERCISE PROGRAM: Access Code: [email or web address] URL: [email or web address] Date: 01/11/2024 Prepared by: Gustavus Bryant  Exercises - Eccentric Heel Lowering on Step  - 1 x daily - 5 x weekly - 2 sets - 15 reps - Single Leg Balance with Four Way Reach and Rotation  - 1 x daily - 5 x weekly - 2 sets - 115 reps  ASSESSMENT:  CLINICAL IMPRESSION: patient returns for f/u and re-assessment.  Symptoms fluctuate from mild to none.  Has been semi compliant with wearing arch supports and preforming HEP.  AROM and strength functional, pes planus still present.  Able to SLS 30s B, TTP B tibialis posterior.  Symptoms much improved but not resolved .  Patient to adhere more to HEP and arch supports and return to [facility] in 3 weeks to assess benefit.  OBJECTIVE IMPAIRMENTS: Abnormal gait, decreased activity tolerance, decreased endurance, decreased knowledge of condition, difficulty walking, decreased strength, increased fascial restrictions, postural dysfunction, and pain.   ACTIVITY LIMITATIONS: carrying, lifting, squatting, and stairs  PERSONAL FACTORS: Fitness and Time since onset of injury/illness/exacerbation are also affecting patient's functional outcome.   REHAB POTENTIAL: Good  CLINICAL DECISION MAKING: Stable/uncomplicated  EVALUATION COMPLEXITY: Low   GOALS: Goals reviewed with patient? No  SHORT TERM GOALS: Target date: 12/14/2023   Patient to demonstrate independence in HEP  Baseline: [id number] Goal status: Met  2.  Obtain gel inserts Baseline: TBD; 12/23/23 New shoes Goal status: Met    LONG TERM GOALS: Target date: 02/11/2024  Patient will acknowledge 1/10 pain at least once during episode of care   Baseline: 2/10 Goal status: Ongoing  2.  Patient will score at least 75% on FOTO to signify clinically  meaningful improvement in functional abilities.   Baseline: 69%; 01/11/24 62% Goal status: Ongoing  3.  Patient will increase 30s chair stand reps from 10 to 14 with/without arms to demonstrate and improved functional ability with less pain/difficulty as well as reduce fall risk.  Baseline: 10 Goal status: INITIAL  4.  Increase B PF strength to 4+/5 Baseline: 4/5; 01/11/24 4+/5 Goal  status: Met    PLAN:  PT FREQUENCY: 1-2x/week  PT DURATION: 6 weeks  PLANNED INTERVENTIONS: 97164- PT Re-evaluation, 97110-Therapeutic exercises, 97530- Therapeutic activity, 97112- Neuromuscular re-education, 97535- Self Care, [id number]- Manual therapy, [phone]- Gait training, [phone]- Aquatic Therapy, 97014- Electrical stimulation (unattended), Balance training, Stair training, Taping, Dry Needling, Joint mobilization, and Spinal mobilization  PLAN FOR NEXT SESSION: HEP review and update, manual techniques as appropriate, aerobic tasks, ROM and flexibility activities, strengthening and PREs, TPDN, gait and balance training as needed     Hildred Laser, PT 01/11/2024, 3:33 PM

## 2024-01-11 ENCOUNTER — Ambulatory Visit: Payer: 59 | Attending: Sports Medicine

## 2024-01-11 DIAGNOSIS — M6281 Muscle weakness (generalized): Secondary | ICD-10-CM | POA: Diagnosis present

## 2024-01-11 DIAGNOSIS — R2689 Other abnormalities of gait and mobility: Secondary | ICD-10-CM | POA: Diagnosis present

## 2024-01-11 DIAGNOSIS — M25571 Pain in right ankle and joints of right foot: Secondary | ICD-10-CM | POA: Insufficient documentation

## 2024-01-11 DIAGNOSIS — M722 Plantar fascial fibromatosis: Secondary | ICD-10-CM | POA: Insufficient documentation

## 2024-01-19 ENCOUNTER — Other Ambulatory Visit: Payer: Self-pay | Admitting: Internal Medicine

## 2024-01-19 ENCOUNTER — Ambulatory Visit: Payer: 59 | Admitting: Adult Health

## 2024-01-26 ENCOUNTER — Encounter: Payer: Self-pay | Admitting: Internal Medicine

## 2024-01-28 ENCOUNTER — Ambulatory Visit: Payer: 59

## 2024-01-28 DIAGNOSIS — I428 Other cardiomyopathies: Secondary | ICD-10-CM

## 2024-01-29 LAB — CUP PACEART REMOTE DEVICE CHECK
Battery Remaining Longevity: 3 mo
Battery Remaining Percentage: 3 %
Battery Voltage: 2.6 V
Brady Statistic AP VP Percent: 1 %
Brady Statistic AP VS Percent: 25 %
Brady Statistic AS VP Percent: 1 %
Brady Statistic AS VS Percent: 73 %
Brady Statistic RA Percent Paced: 22 %
Brady Statistic RV Percent Paced: 1 %
Date Time Interrogation Session: 20250320020032
HighPow Impedance: 72 Ohm
HighPow Impedance: 72 Ohm
Implantable Lead Connection Status: 753985
Implantable Lead Connection Status: 753985
Implantable Lead Implant Date: 20151104
Implantable Lead Implant Date: 20151104
Implantable Lead Location: 753859
Implantable Lead Location: 753860
Implantable Lead Model: 5076
Implantable Pulse Generator Implant Date: 20151104
Lead Channel Impedance Value: 340 Ohm
Lead Channel Impedance Value: 480 Ohm
Lead Channel Pacing Threshold Amplitude: 0.5 V
Lead Channel Pacing Threshold Amplitude: 1 V
Lead Channel Pacing Threshold Pulse Width: 0.4 ms
Lead Channel Pacing Threshold Pulse Width: 0.4 ms
Lead Channel Sensing Intrinsic Amplitude: 10.5 mV
Lead Channel Sensing Intrinsic Amplitude: 4.3 mV
Lead Channel Setting Pacing Amplitude: 2 V
Lead Channel Setting Pacing Amplitude: 2.5 V
Lead Channel Setting Pacing Pulse Width: 0.4 ms
Lead Channel Setting Sensing Sensitivity: 0.5 mV
Pulse Gen Serial Number: 7179320

## 2024-02-01 NOTE — Therapy (Unsigned)
 OUTPATIENT PHYSICAL THERAPY TREATMENT NOTE   Patient Name: Julie Saunders MRN: [id number] DOB:07-31-1986, 38 y.o., female Today's Date: 02/01/2024  END OF SESSION:         Past Medical History:  Diagnosis Date   AICD (automatic cardioverter/defibrillator) present 09/14/2015   STJ dual chamber ICD implanted by Dr Graciela Husbands for aborted cardiac arrest   Allergy    Bradycardia    Cardiac arrest (HCC) 08/31/2014   Crohn disease (HCC)    Depression    History of blood transfusion 09/2012   "had allergic rxn to one of my Crohn's RX"    Migraine    "a few times/yr" (06/08/2015)   Pancytopenia (HCC) 11/23/2012   Status post radiofrequency ablation for arrhythmia, atrial tach 06/08/15 06/08/2015   SVT (supraventricular tachycardia) (HCC) 12/08/2014   likely an atrial tachycardia with CL 250 msec   Past Surgical History:  Procedure Laterality Date   : LAPAROSCOPY, SURGICAL; COLECTOMY, PARTIAL, WITH REMOVAL OF TERMINAL ILEUM WITH ILEOCOLOSTOMY 02/06/2022  02/06/2022   ABLATION     ELECTROPHYSIOLOGIC STUDY N/A 06/08/2015   Procedure: SVT Ablation;  Surgeon: Hillis Range, MD;  Location: [facility] INVASIVE CV LAB;  Service: Cardiovascular;  Laterality: N/A;   ELECTROPHYSIOLOGY STUDY N/A 09/11/2014   Procedure: ELECTROPHYSIOLOGY STUDY;  Surgeon: Duke Salvia, MD;  Location: [facility] CATH LAB;  Service: Cardiovascular;  Laterality: N/A;   IMPLANTABLE CARDIOVERTER DEFIBRILLATOR IMPLANT N/A 09/13/2014   Procedure: IMPLANTABLE CARDIOVERTER DEFIBRILLATOR IMPLANT;  Surgeon: Duke Salvia, MD;  Location: [facility] CATH LAB;  Service: Cardiovascular;  Laterality: N/A;; STJ dual chamber ICD implanted by Dr Graciela Husbands for aborted cardiac arrest   Patient Active Problem List   Diagnosis Date Noted   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   Dual implantable cardioverter-defibrillator in situ 09/22/2018   Crohn's disease of both small and large intestine without complication (HCC) 09/22/2018   Status post radiofrequency ablation  for arrhythmia, atrial tach 06/08/15 06/08/2015   SVT (supraventricular tachycardia) (HCC) 04/16/2015   Defibrillator discharge    VT (ventricular tachycardia) (HCC)    Chronic systolic heart failure (HCC) 09/14/2014   Ventricular fibrillation (HCC) 09/11/2014   Sinus bradycardia 09/11/2014   Cardiomyopathy (HCC) 09/11/2014   Acute renal failure (HCC) 09/08/2014   Crohn's duodenitis (HCC) 09/08/2014   Prolonged QT interval 09/08/2014   Anemia 09/08/2014   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  Rationale for Evaluation and Treatment: Rehabilitation  ONSET DATE: chronic  SUBJECTIVE:   SUBJECTIVE STATEMENT: Still notes B symptoms, localized to calf region and along post tibialis tendon distribution.  Rates herself at 80%  PERTINENT HISTORY: 1. Chronic pain of right ankle -Chronic with exacerbation, subsequent visit - Resolved flare of right ankle pain after course of prednisone and starting HEP - Discontinue prednisone.  Dosepak completed - Continue HEP - Use Tylenol as needed   2. Left foot pain 3. Plantar fasciitis of left foot -Acute, uncomplicated, initial sports medicine visit - Most consistent with plantar fasciitis of left foot.  Possibly flared due to compensation versus tissue inflammation related to ciprofloxacin course - Start HEP and physical therapy for plantar  fasciitis - Start Voltaren gel topically over areas of pain - May use Tylenol for day-to-day pain relief - Do not recommend NSAID use due to past medical history of Crohn's.  Do not  recommend repeat prednisone course since we completed a prednisone course last month.   Pertinent previous records reviewed include left foot x-ray from 07/04/2023   Follow Up: 4 weeks for reevaluation.  If no improvement or worsening of symptoms, could consider plantar fasciitis CSI PAIN:  Are you having pain? Yes: NPRS scale: 2/10 B Pain location: L arch, R medial aspect Pain description: ache  Aggravating factors: activity Relieving factors: rest and time  PRECAUTIONS: None  RED FLAGS: None   WEIGHT BEARING RESTRICTIONS: No  FALLS:  Has patient fallen in last 6 months? No  OCCUPATION: not working   PLOF: Independent  PATIENT GOALS: To manage my ankle/foot symptoms  NEXT MD VISIT: 4 weeks  OBJECTIVE:  Note: Objective measures were completed at Evaluation unless otherwise noted.  DIAGNOSTIC FINDINGS: CLINICAL DATA:  Right ankle pain for 3 weeks   EXAM: RIGHT ANKLE - COMPLETE 3+ VIEW   COMPARISON:  None Available.   FINDINGS: Normal alignment without acute osseous finding, fracture, or large joint effusion. No significant arthropathy. Diffuse lower extremity and ankle edema noted.   IMPRESSION: Diffuse lower extremity and ankle edema. No acute osseous finding.     Electronically Signed   By: Judie Petit.  Shick M.D.   On: 10/11/2023 19:22  CLINICAL DATA:  Pain around great toe after a slip and fall last night. Swelling of the foot.   EXAM: LEFT FOOT - COMPLETE 3+ VIEW   COMPARISON:  None Available.   FINDINGS: Dorsal soft tissue swelling along the forefoot. No fracture or acute bony findings.   IMPRESSION: 1. Dorsal soft tissue swelling along the forefoot. No fracture or acute bony findings.     Electronically Signed   By: Gaylyn Rong M.D.   On: 07/04/2023 17:54  PATIENT SURVEYS:  FOTO 69(75 predicted) ; 01/11/23 62%  MUSCLE LENGTH: deferred  POSTURE:  mild pes planus B  PALPATION: L plantar fascial  LOWER EXTREMITY ROM:  A/PROM  Right eval Left eval B  01/11/24  Hip flexion     Hip extension     Hip abduction     Hip adduction     Hip internal rotation     Hip external rotation     Knee flexion     Knee extension     Ankle dorsiflexion 10/15d 10/15d 15/20d  Ankle plantarflexion WNL WNL   Ankle inversion WNL WNL   Ankle eversion WNL WNL    (Blank rows = not tested)  LOWER EXTREMITY MMT:  MMT Right eval Left eval B 01/11/24  Hip flexion     Hip extension     Hip abduction     Hip adduction     Hip internal rotation     Hip external rotation     Knee flexion     Knee extension     Ankle dorsiflexion     Ankle plantarflexion 4 4 4+  Ankle inversion     Ankle eversion      (Blank rows = not tested)  LOWER EXTREMITY SPECIAL TESTS:  Ankle special tests: Talar tilt test: negative, Homan's test: negative, and Tinel's test-Posterior tibialis: negative 01/11/24 (+) Tarsal tunnel on R SLS 30s B  FUNCTIONAL TESTS:  30 seconds chair stand test 10 reps no UE assist  GAIT: Distance walked: 25ft x2  Assistive device utilized: None Level of assistance: Complete Independence Comments: good cadence                                                                                                                                TREATMENT DATE:  [facility] Adult PT Treatment:                                                DATE: 01/11/24 Re-assessment Self Care: Additional minutes spent for educating on updated Therapeutic Home Exercise Program as well as comparing current status to condition at start of care. This included exercises focusing on stretching, strengthening, with focus on eccentric aspects. Long term goals include an improvement in range of motion, strength, endurance as well as avoiding reinjury. Patient's frequency would include in 1-2 times a day, 3-5 times a week for a duration of 6-12 weeks. Proper technique shown and discussed handout in great detail. All questions were discussed and addressed.       [facility] Adult PT Treatment:                                                DATE: 12/23/23 Therapeutic Exercise: Nustep L5 6 min L gastroc stretch 30s x2 Manual Therapy: L subtalar mobs 5x10 M/L/distraction Neuromuscular re-ed: L Rearfoot on Airex with 5# KB swing 60s A/P, 60s M/L L step downs 2 in 10x f/p 4 in 10x Heel raises from 4 in step 15x focus on eccentric  [facility] Adult PT Treatment:                                                DATE: 12/15/23 Therapeutic Exercise: Nustep L4 8 min Neuromuscular re-ed: Step tap and WS onto 8 in block from Airex pad 15x B 4 in lateral steps 15x B 4 in A/P stepping 15x B B heel raises with tennis ball squeeze 15x2 Runners step 4 in block 5# KB Therapeutic Activity: Conc/ecc heel raises from 4 in block 15x2 Great toe flexion B RTB 15x2 B INV/EV using 4# DB on towel  [facility] Adult PT Treatment:                                                DATE: 12/07/23 Therapeutic Exercise: Nustep L3 8 min L PF stretch with towel 30s x2 Great toe flexion L YTB 15x2 L DF/INV/EV RTB 15x2 ea. Conc/ecc heel  raises from 4 in block 15x2 Step downs L 4 in 10x Manual Therapy: STM L PT 2 min  [facility] Adult PT Treatment:                                                DATE: 12/01/23 Therapeutic Exercise: Nustep L2 8 min L PF stretch with towel 30s x2 Great toe flexion L YTB 15x2 L DF/INV/EV YTB 15x2 ea. Conc/ecc heel raises from 4 in block 15x2   11/23/23 Eval and HEP  PATIENT EDUCATION:  Education details: Discussed eval findings, rehab rationale and POC and patient is in agreement  Person educated: Patient Education method: Explanation Education comprehension: verbalized understanding and needs further education  HOME EXERCISE PROGRAM: Access Code: [email or web address] URL: [email or web address] Date: 01/11/2024 Prepared by: Gustavus Bryant  Exercises - Eccentric Heel Lowering on Step  - 1 x daily - 5 x weekly - 2 sets - 15 reps - Single Leg Balance with  Four Way Reach and Rotation  - 1 x daily - 5 x weekly - 2 sets - 115 reps  ASSESSMENT:  CLINICAL IMPRESSION: patient returns for f/u and re-assessment.  Symptoms fluctuate from mild to none.  Has been semi compliant with wearing arch supports and preforming HEP.  AROM and strength functional, pes planus still present.  Able to SLS 30s B, TTP B tibialis posterior.  Symptoms much improved but not resolved .  Patient to adhere more to HEP and arch supports and return to [facility] in 3 weeks to assess benefit.  OBJECTIVE IMPAIRMENTS: Abnormal gait, decreased activity tolerance, decreased endurance, decreased knowledge of condition, difficulty walking, decreased strength, increased fascial restrictions, postural dysfunction, and pain.   ACTIVITY LIMITATIONS: carrying, lifting, squatting, and stairs  PERSONAL FACTORS: Fitness and Time since onset of injury/illness/exacerbation are also affecting patient's functional outcome.   REHAB POTENTIAL: Good  CLINICAL DECISION MAKING: Stable/uncomplicated  EVALUATION COMPLEXITY: Low   GOALS: Goals reviewed with patient? No  SHORT TERM GOALS: Target date: 12/14/2023   Patient to demonstrate independence in HEP  Baseline: [id number] Goal status: Met  2.  Obtain gel inserts Baseline: TBD; 12/23/23 New shoes Goal status: Met    LONG TERM GOALS: Target date: 02/11/2024  Patient will acknowledge 1/10 pain at least once during episode of care   Baseline: 2/10 Goal status: Ongoing  2.  Patient will score at least 75% on FOTO to signify clinically meaningful improvement in functional abilities.   Baseline: 69%; 01/11/24 62% Goal status: Ongoing  3.  Patient will increase 30s chair stand reps from 10 to 14 with/without arms to demonstrate and improved functional ability with less pain/difficulty as well as reduce fall risk.  Baseline: 10 Goal status: INITIAL  4.  Increase B PF strength to 4+/5 Baseline: 4/5; 01/11/24 4+/5 Goal status: Met    PLAN:  PT  FREQUENCY: 1-2x/week  PT DURATION: 6 weeks  PLANNED INTERVENTIONS: 97164- PT Re-evaluation, 97110-Therapeutic exercises, 97530- Therapeutic activity, 97112- Neuromuscular re-education, 97535- Self Care, 81191- Manual therapy, [phone]- Gait training, [phone]- Aquatic Therapy, 97014- Electrical stimulation (unattended), Balance training, Stair training, Taping, Dry Needling, Joint mobilization, and Spinal mobilization  PLAN FOR NEXT SESSION: HEP review and update, manual techniques as appropriate, aerobic tasks, ROM and flexibility activities, strengthening and PREs, TPDN, gait and balance training as needed     Hildred Laser, PT 02/01/2024, 9:29 AM

## 2024-02-02 NOTE — Therapy (Addendum)
 " OUTPATIENT PHYSICAL THERAPY TREATMENT NOTE/DISCHARGE   Patient Name: Julie Saunders MRN: [id number] DOB:[birth year], 38 y.o., female Today's Date: 02/03/2024  END OF SESSION:  PT End of Session - 02/03/24 1355     Visit Number 7    Number of Visits 12    Date for PT Re-Evaluation 01/21/24    Authorization Type U[employer]    PT Start Time 1400    PT Stop Time 1440    PT Time Calculation (min) 40 min    Activity Tolerance Patient tolerated treatment well    Behavior During Therapy [facility] for tasks assessed/performed            Past Medical History:  Diagnosis Date   AICD (automatic cardioverter/defibrillator) present 09/14/2015   STJ dual chamber ICD implanted by Dr Fernande for aborted cardiac arrest   Allergy    Bradycardia    Cardiac arrest (HCC) 08/31/2014   Crohn disease (HCC)    Depression    History of blood transfusion 09/2012   had allergic rxn to one of my Crohn's RX    Migraine    a few times/yr (06/08/2015)   Pancytopenia (HCC) 11/23/2012   Status post radiofrequency ablation for arrhythmia, atrial tach 06/08/15 06/08/2015   SVT (supraventricular tachycardia) (HCC) 12/08/2014   likely an atrial tachycardia with CL 250 msec   Past Surgical History:  Procedure Laterality Date   : LAPAROSCOPY, SURGICAL; COLECTOMY, PARTIAL, WITH REMOVAL OF TERMINAL ILEUM WITH ILEOCOLOSTOMY 02/06/2022  02/06/2022   ABLATION     ELECTROPHYSIOLOGIC STUDY N/A 06/08/2015   Procedure: SVT Ablation;  Surgeon: Lynwood Rakers, MD;  Location: [facility] INVASIVE CV LAB;  Service: Cardiovascular;  Laterality: N/A;   ELECTROPHYSIOLOGY STUDY N/A 09/11/2014   Procedure: ELECTROPHYSIOLOGY STUDY;  Surgeon: Elspeth JAYSON Fernande, MD;  Location: [facility] CATH LAB;  Service: Cardiovascular;  Laterality: N/A;   IMPLANTABLE CARDIOVERTER DEFIBRILLATOR IMPLANT N/A 09/13/2014   Procedure: IMPLANTABLE CARDIOVERTER DEFIBRILLATOR IMPLANT;  Surgeon: Elspeth JAYSON Fernande, MD;  Location: [facility] CATH LAB;  Service: Cardiovascular;  Laterality: N/A;; STJ  dual chamber ICD implanted by Dr Fernande for aborted cardiac arrest   Patient Active Problem List   Diagnosis Date Noted   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   Dual implantable cardioverter-defibrillator in situ 09/22/2018   Crohn's disease of both small and large intestine without complication (HCC) 09/22/2018   Status post radiofrequency ablation for arrhythmia, atrial tach 06/08/15 06/08/2015   SVT (supraventricular tachycardia) (HCC) 04/16/2015   Defibrillator discharge    VT (ventricular tachycardia) (HCC)    Chronic systolic heart failure (HCC) 09/14/2014   Ventricular fibrillation (HCC) 09/11/2014   Sinus bradycardia 09/11/2014   Cardiomyopathy (HCC) 09/11/2014   Acute renal failure (HCC) 09/08/2014   Crohn's duodenitis (HCC) 09/08/2014   Prolonged QT interval 09/08/2014   Anemia 09/08/2014   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   Cardiac arrest (HCC) 08/31/2014   Hypokalemia 08/31/2014   Acute respiratory failure with hypoxia (HCC) 08/31/2014   Pancytopenia (HCC) 11/23/2012    PCP: Leonel Cole, MD   REFERRING PROVIDER: Leonce Katz, DO  REFERRING DIAG: [phone] (ICD-10-CM) - Chronic pain of right ankle M79.672 (ICD-10-CM) - Left foot pain M72.2 (ICD-10-CM) - Plantar fasciitis of left foot  THERAPY DIAG:  Plantar fasciitis of left foot  Pain in right ankle and joints of right foot  Muscle weakness (generalized)  Other abnormalities of gait and mobility  Rationale for Evaluation and Treatment: Rehabilitation  ONSET DATE: chronic  SUBJECTIVE:   SUBJECTIVE STATEMENT: Continues to do well,  arch supports help but does not always wear them.  Rates herself at 90%.  Notes symptoms in L arch with first step on occasion.   PERTINENT HISTORY: 1. Chronic pain of right ankle -Chronic with exacerbation, subsequent visit - Resolved flare of right ankle pain after course of prednisone  and starting HEP - Discontinue prednisone .  Dosepak completed -  Continue HEP - Use Tylenol  as needed   2. Left foot pain 3. Plantar fasciitis of left foot -Acute, uncomplicated, initial sports medicine visit - Most consistent with plantar fasciitis of left foot.  Possibly flared due to compensation versus tissue inflammation related to ciprofloxacin course - Start HEP and physical therapy for plantar fasciitis - Start Voltaren gel topically over areas of pain - May use Tylenol  for day-to-day pain relief - Do not recommend NSAID use due to past medical history of Crohn's.  Do not recommend repeat prednisone  course since we completed a prednisone  course last month.   Pertinent previous records reviewed include left foot x-ray from 07/04/2023   Follow Up: 4 weeks for reevaluation.  If no improvement or worsening of symptoms, could consider plantar fasciitis CSI PAIN:  Are you having pain? Yes: NPRS scale: 2/10 B Pain location: L arch, R medial aspect Pain description: ache  Aggravating factors: activity Relieving factors: rest and time  PRECAUTIONS: None  RED FLAGS: None   WEIGHT BEARING RESTRICTIONS: No  FALLS:  Has patient fallen in last 6 months? No  OCCUPATION: not working   PLOF: Independent  PATIENT GOALS: To manage my ankle/foot symptoms  NEXT MD VISIT: 4 weeks  OBJECTIVE:  Note: Objective measures were completed at Evaluation unless otherwise noted.  DIAGNOSTIC FINDINGS: CLINICAL DATA:  Right ankle pain for 3 weeks   EXAM: RIGHT ANKLE - COMPLETE 3+ VIEW   COMPARISON:  None Available.   FINDINGS: Normal alignment without acute osseous finding, fracture, or large joint effusion. No significant arthropathy. Diffuse lower extremity and ankle edema noted.   IMPRESSION: Diffuse lower extremity and ankle edema. No acute osseous finding.     Electronically Signed   By: CHRISTELLA.  Shick M.D.   On: 10/11/2023 19:22  CLINICAL DATA:  Pain around great toe after a slip and fall last night. Swelling of the foot.   EXAM: LEFT FOOT  - COMPLETE 3+ VIEW   COMPARISON:  None Available.   FINDINGS: Dorsal soft tissue swelling along the forefoot. No fracture or acute bony findings.   IMPRESSION: 1. Dorsal soft tissue swelling along the forefoot. No fracture or acute bony findings.     Electronically Signed   By: Ryan Salvage M.D.   On: 07/04/2023 17:54  PATIENT SURVEYS:  FOTO 69(75 predicted); 01/11/23 62%; 02/03/24 67%  MUSCLE LENGTH: deferred  POSTURE: mild pes planus B  PALPATION: L plantar fascial  LOWER EXTREMITY ROM:  A/PROM Right eval Left eval B  01/11/24 B 02/03/24  Hip flexion      Hip extension      Hip abduction      Hip adduction      Hip internal rotation      Hip external rotation      Knee flexion      Knee extension      Ankle dorsiflexion 10/15d 10/15d 15/20d 12/15d  Ankle plantarflexion WNL WNL    Ankle inversion WNL WNL    Ankle eversion WNL WNL     (Blank rows = not tested)  LOWER EXTREMITY MMT:  MMT Right eval Left eval B 01/11/24  Hip flexion     Hip extension     Hip abduction     Hip adduction     Hip internal rotation     Hip external rotation     Knee flexion     Knee extension     Ankle dorsiflexion     Ankle plantarflexion 4 4 4+  Ankle inversion     Ankle eversion      (Blank rows = not tested)  LOWER EXTREMITY SPECIAL TESTS:  Ankle special tests: Talar tilt test: negative, Homan's test: negative, and Tinel's test-Posterior tibialis: negative 01/11/24 (+) Tarsal tunnel on R SLS 30s B  FUNCTIONAL TESTS:  30 seconds chair stand test 10 reps no UE assist 02/03/24 18  GAIT: Distance walked: 27ft x2 Assistive device utilized: None Level of assistance: Complete Independence Comments: good cadence                                                                                                                                TREATMENT DATE:  [facility] Adult PT Treatment:                                                DATE: 02/03/24 Therapeutic  Exercise: Nustep L6 8 min Neuromuscular re-ed: Runners step 4 in 15/15 no UE support B heel raises off 4 in block emphasis on eccentric aspect 4 way stepping 15x B no UE support Therapeutic Activity: Assessment of ROM, strength, balance, posture and function as well as HEP update and review   [facility] Adult PT Treatment:                                                DATE: 01/11/24 Re-assessment Self Care: Additional minutes spent for educating on updated Therapeutic Home Exercise Program as well as comparing current status to condition at start of care. This included exercises focusing on stretching, strengthening, with focus on eccentric aspects. Long term goals include an improvement in range of motion, strength, endurance as well as avoiding reinjury. Patient's frequency would include in 1-2 times a day, 3-5 times a week for a duration of 6-12 weeks. Proper technique shown and discussed handout in great detail. All questions were discussed and addressed.      [facility] Adult PT Treatment:                                                DATE: 12/23/23 Therapeutic Exercise: Nustep L5 6 min L gastroc stretch 30s x2 Manual Therapy: L subtalar mobs 5x10 M/L/distraction  Neuromuscular re-ed: L Rearfoot on Airex with 5# KB swing 60s A/P, 60s M/L L step downs 2 in 10x f/p 4 in 10x Heel raises from 4 in step 15x focus on eccentric  [facility] Adult PT Treatment:                                                DATE: 12/15/23 Therapeutic Exercise: Nustep L4 8 min Neuromuscular re-ed: Step tap and WS onto 8 in block from Airex pad 15x B 4 in lateral steps 15x B 4 in A/P stepping 15x B B heel raises with tennis ball squeeze 15x2 Runners step 4 in block 5# KB Therapeutic Activity: Conc/ecc heel raises from 4 in block 15x2 Great toe flexion B RTB 15x2 B INV/EV using 4# DB on towel  [facility] Adult PT Treatment:                                                DATE: 12/07/23 Therapeutic Exercise: Nustep L3 8 min L PF  stretch with towel 30s x2 Great toe flexion L YTB 15x2 L DF/INV/EV RTB 15x2 ea. Conc/ecc heel raises from 4 in block 15x2 Step downs L 4 in 10x Manual Therapy: STM L PT 2 min  [facility] Adult PT Treatment:                                                DATE: 12/01/23 Therapeutic Exercise: Nustep L2 8 min L PF stretch with towel 30s x2 Great toe flexion L YTB 15x2 L DF/INV/EV YTB 15x2 ea. Conc/ecc heel raises from 4 in block 15x2   11/23/23 Eval and HEP  PATIENT EDUCATION:  Education details: Discussed eval findings, rehab rationale and POC and patient is in agreement  Person educated: Patient Education method: Explanation Education comprehension: verbalized understanding and needs further education  HOME EXERCISE PROGRAM: Access Code: [email or web address] URL: [email or web address] Date: 01/11/2024 Prepared by: Reyes Kohut  Exercises - Eccentric Heel Lowering on Step  - 1 x daily - 5 x weekly - 2 sets - 15 reps - Single Leg Balance with Four Way Reach and Rotation  - 1 x daily - 5 x weekly - 2 sets - 115 reps  ASSESSMENT:  CLINICAL IMPRESSION: All rehab goals met, patient has reached a plateau w/o any functional deficits remaining.  Continued structural issues remain, addressed by arch supports.  No further skilled intervention warranted.  HEP updated and reviewed to address progress made and remaining deficits.  OBJECTIVE IMPAIRMENTS: Abnormal gait, decreased activity tolerance, decreased endurance, decreased knowledge of condition, difficulty walking, decreased strength, increased fascial restrictions, postural dysfunction, and pain.   ACTIVITY LIMITATIONS: carrying, lifting, squatting, and stairs  PERSONAL FACTORS: Fitness and Time since onset of injury/illness/exacerbation are also affecting patient's functional outcome.   REHAB POTENTIAL: Good  CLINICAL DECISION MAKING: Stable/uncomplicated  EVALUATION COMPLEXITY: Low   GOALS: Goals reviewed with patient?  No  SHORT TERM GOALS: Target date: 12/14/2023   Patient to demonstrate independence in HEP  Baseline: [email or web address] Goal status: Met  2.  Obtain gel inserts Baseline: TBD; 12/23/23 New shoes Goal status:  Met    LONG TERM GOALS: Target date: 02/11/2024  Patient will acknowledge 1/10 pain at least once during episode of care   Baseline: 2/10; 0/10 pain at best Goal status: Met  2.  Patient will score at least 75% on FOTO to signify clinically meaningful improvement in functional abilities.   Baseline: 69%; 01/11/24 62%; 02/03/24 67% Goal status: Not met  3.  Patient will increase 30s chair stand reps from 10 to 14 with/without arms to demonstrate and improved functional ability with less pain/difficulty as well as reduce fall risk.  Baseline: 10; 18  Goal status: Met  4.  Increase B PF strength to 4+/5 Baseline: 4/5; 01/11/24 4+/5 Goal status: Met    PLAN:  PT FREQUENCY: 1x/week  PT DURATION: 1 week  PLANNED INTERVENTIONS: 97164- PT Re-evaluation, 97110-Therapeutic exercises, 97530- Therapeutic activity, 97112- Neuromuscular re-education, 97535- Self Care, 02859- Manual therapy, [phone]- Gait training, [phone]- Aquatic Therapy, 97014- Electrical stimulation (unattended), Balance training, Stair training, Taping, Dry Needling, Joint mobilization, and Spinal mobilization  PLAN FOR NEXT SESSION: HEP review and update, manual techniques as appropriate, aerobic tasks, ROM and flexibility activities, strengthening and PREs, TPDN, gait and balance training as needed     Reyes CHRISTELLA Kohut, PT 02/03/2024, 2:53 PM  "

## 2024-02-03 ENCOUNTER — Ambulatory Visit

## 2024-02-03 DIAGNOSIS — M722 Plantar fascial fibromatosis: Secondary | ICD-10-CM

## 2024-02-03 DIAGNOSIS — M6281 Muscle weakness (generalized): Secondary | ICD-10-CM

## 2024-02-03 DIAGNOSIS — M25571 Pain in right ankle and joints of right foot: Secondary | ICD-10-CM

## 2024-02-03 DIAGNOSIS — R2689 Other abnormalities of gait and mobility: Secondary | ICD-10-CM

## 2024-02-04 ENCOUNTER — Other Ambulatory Visit: Payer: Self-pay | Admitting: Internal Medicine

## 2024-02-04 ENCOUNTER — Telehealth: Payer: Self-pay | Admitting: Internal Medicine

## 2024-02-04 DIAGNOSIS — I428 Other cardiomyopathies: Secondary | ICD-10-CM | POA: Diagnosis not present

## 2024-02-04 NOTE — Progress Notes (Signed)
 Remote ICD transmission.

## 2024-02-04 NOTE — Telephone Encounter (Signed)
 Pt c/o medication issue:  1. Name of Medication: Klor-Con 20 MEQ  2. How are you currently taking this medication (dosage and times per day)? 1 daily  3. Are you having a reaction (difficulty breathing--STAT)? No   4. What is your medication issue? Pt  tried to refill it and was told by the pharmacy that we said she was not suppose to be taking it. Pt said she is not aware of this. Please advise.

## 2024-02-04 NOTE — Telephone Encounter (Signed)
 Spoke with patient and she states she went to get refill on her potassium and was told it was d/c. She states she was not aware she needed to stop taking her potassium. Reviewing chart it looks like it was d/c at last office visit but its not on AVS.  Please clarify

## 2024-02-05 ENCOUNTER — Telehealth: Payer: Self-pay | Admitting: Internal Medicine

## 2024-02-05 NOTE — Telephone Encounter (Signed)
 Pt c/o medication issue:  1. Name of Medication:    2. How are you currently taking this medication (dosage and times per day)?    3. Are you having a reaction (difficulty breathing--STAT)? no  4. What is your medication issue? Patient didn't know that she was suppose to stop taking the medication. Please advise

## 2024-02-05 NOTE — Telephone Encounter (Signed)
 Spoke with pt and advised it looks as if Potassium was removed by CMA on 10/26/23 during OV with Dr Graciela Husbands due to completed course.  Pt states she continues to take KCL and has not to her knowledge been advised to stop.  Pt currently on Spironolactone and Losartan which are potassium sparing medications.  Pt is asking if she needs to continue KCL as she is out of the medication and will need a refill.  Pt completed labs on 01/06/24 with K+ of 3.7.  Pt advised will need to forward to PharmD and Dr Graciela Husbands for review and apologized if medication was accidentally deleted.  Pt verbalizes understanding and thanked Charity fundraiser for the call.

## 2024-02-05 NOTE — Telephone Encounter (Signed)
 This has been addressed in another encounter.  Please see that encounter for complete details.

## 2024-02-08 ENCOUNTER — Telehealth: Payer: Self-pay

## 2024-02-08 DIAGNOSIS — I5022 Chronic systolic (congestive) heart failure: Secondary | ICD-10-CM

## 2024-02-08 MED ORDER — POTASSIUM CHLORIDE CRYS ER 20 MEQ PO TBCR: 20.0000 meq | EXTENDED_RELEASE_TABLET | Freq: Every day | ORAL | 1 refills | Status: DC

## 2024-02-08 NOTE — Telephone Encounter (Signed)
 Attempted phone call to pt and left voicemail message to contact office at [phone] or respond to MyChart message.

## 2024-02-08 NOTE — Telephone Encounter (Signed)
 Alert received from [employer] for ICD reaching ERI 02/06/24. Patient called and aware a scheduler will contact for apt in office to discuss gen change.

## 2024-02-08 NOTE — Addendum Note (Signed)
 Addended by: Alois Cliche on: 02/08/2024 12:42 PM   Modules accepted: Orders

## 2024-02-08 NOTE — Telephone Encounter (Signed)
 Would confirm, but if she was taking all 3 at the time of the labs, I would continue with the KCL supplements

## 2024-02-16 ENCOUNTER — Ambulatory Visit: Payer: 59 | Attending: Cardiology | Admitting: Genetic Counselor

## 2024-02-17 ENCOUNTER — Encounter: Payer: Self-pay | Admitting: Adult Health

## 2024-02-17 ENCOUNTER — Other Ambulatory Visit: Payer: Self-pay

## 2024-02-17 DIAGNOSIS — I469 Cardiac arrest, cause unspecified: Secondary | ICD-10-CM

## 2024-02-17 MED ORDER — EMGALITY 120 MG/ML ~~LOC~~ SOAJ: 120.0000 mg | SUBCUTANEOUS | 1 refills | Status: DC

## 2024-02-17 NOTE — Progress Notes (Signed)
 Order placed as requested  Thank you referring Julie Saunders for a genetics consult of her cardiac arrest. She is an appropriate candidate for testing considering her age, presentation and Fx of SD in mom and maternal grandmother.   Can you or your nurse, please place an order in EPIC for her genetic test. To do this go to Labs, select [facility], scroll to Sudden Cardiac Arrest.   Take care and  I hope to see you soon.   Regards  Sumy

## 2024-02-19 NOTE — Progress Notes (Signed)
 Referral Reason Isys Tietje was referred for genetic consult and testing for sudden cardiac arrest.  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 Caucasian female who was admitted on August 31, 2014 after an out of hospital cardiac arrest.  She tells me that she was living with her paternal grandmother and uncle at the time as her mother had passed away suddenly 2 months ago.  The day of her cardiac arrest she had severe diarrhea from Crohn's disease and was washing her hands when she began feeling hot and collapsed. Her uncle heard her fall and rushed in to find her slumped over the tub. EMS was summoned and they were able to revive her. She arrived at the hospital with Vfib arrest complicated by anoxic brain injury with hypokalemia and prolonged QT interval. She was placed in a medically induced coma for 24 hours. Upon recovery, she had an ICD implanted by Dr.Klein on 09/13/2014.She reports being shocked four times after this and tells me that she underwent an ablation in 2016. She has been doing well since then.  Traditional Risk factors Reviewed the risk factors that can also lead to SCA, namely obstructive heart disease, low EF, long QT or BrS EKG profile, hypokalemia or drug overdose. Shakti denies having any of this prior to her cardiac arrest but does note that she was severely dehydrated due to excessive diarrhea from Crohn's disease.   Family history     Relation to Proband Pedigree # Current age Heart condition/age of onset Notes  Children, Siblings None           Father II.5 59 None   Paternal uncles, 3 II.1-II.3 68, 52 Deceased None II.3- Died of COPD @ 60s. Was a smoker, later began dipping tobacco  Paternal grandfather I.1 Deceased Several CABG Died @ 80-natural causes  Paternal grandmother I.2 60 None         Mother II.5 Deceased Mitral valve prolapse Died suddenly @ 71. Witnessed by patient. Mom was talking with her mother-in-law over  the phone when she collapsed mid-sentence and was non-responsive. Patient initiated CPR after 20 minutes and summoned EMS. Found to have blood pooled in her mouth and pharynx. Could not be revived.   Maternal grandfather I.3 Deceased None Died suddenly @ 45s. Wife found him dead in the couch the next day.   Maternal grandmother I.4 Deceased Heart issues Died suddenly @ 40. Husband found her dead-no details   Pre-Test Genetic Consultation Notes I explained to Chantrice that cardiac arrests in an older population are usually associated with coronary artery disease. When a young and otherwise healthy individual suffers a sudden cardiac arrest, it is usually from an inherited arrhythmia and/or cardiomyopathy. In addition, structural -congenital coronary artery abnormalities, cardiac tamponade can lead to SCA.    She was counseled on the genetics of the channelopathies, specifically Brugada syndrome (BrS), Long QT syndrome (LQTS), and Catecholaminergic Polymorphic Ventricular Tachycardia (CPVT) as well as cardiomyopathies including hypertrophic, arrhythmogenic and dilated cardiomyopathy.  We discussed autosomal dominant inheritance, incomplete penetrance and variable expression associated with these conditions.   We walked through the process of genetic testing and discussed the potential outcomes of genetic testing. Genetic testing identifies a pathogenic variant in 10% of unexplained cardiac arrest survivors Linton Ham et al., 2022) where a majority of disease-causing variants were found to be located in genes associated with cardiomyopathy in the absence of an overt cardiomyopathy diagnosis.      Explained to her that there are  three possible outcomes of genetic testing; namely positive, negative and variant of unknown significance. A positive outcome can be expected in patients that do not have risk factors for the above inherited conditions, present early in life with increased severity and have a family  history of sudden cardiac death and/or a relative that has been diagnosed with an inherited condition. I informed them that the genetic basis of SCA and most individual conditions that predispose to SCA remain incompletely understood. While a negative test may indicate that this is an idiopathic condition this may necessitate routine clinical surveillance in first-degree relatives. Variants of unknown significance (VUS) can be obtained. I explained to her that typically a VUS is so classified if the variant is not well understood as very few individuals have been reported to harbor this variant or its role in gene function has not been elucidated.  I also reviewed genetic testing of first-degree family members and subsequent follow-up of those at risk of inheriting the underlying genetic condition. I explained to her that genetic evaluation may influence final diagnosis, treatment recommendations, and family screening.   Impression  In summary, Eisley experienced a SCA at age 26 in the absence of risk factors though she did present with prolonged QT interval at time of admission with reduced potassium levels. While there no family history of an inherited cardiac condition in her first-degree relatives, she does report sudden death at a young age in her mother and maternal grandmother. It is, hence, likely that  she has inherited this condition from her mother. It is recommended that Shemia undergoes genetic testing for inherited arrhythmias and cardiomyopathies that can lead to SCA.  The genetic test will help confirm her diagnosis and identify the genetic basis of her disease. Since this is an autosomal dominant condition, a positive test result will help identify first-degree family members that may harbor the mutation and are at risk of developing this condition. Appropriate cardiology follow-up and lifestyle management can then be directed to those genotype-positive family members.   In addition, we  discussed the protections afforded by the Genetic Information Non-Discrimination Act (GINA). I explained to the patient that GINA protects them from losing their employment or health insurance based on their genotype. However, these protections do not cover life insurance and disability. Patient verbalized understanding of this.  Please note that the patient has not been counseled in this visit on other personal, cultural, or ethical issues that they may face due to their heart condition.   Plan After a thorough discussion of the risk and benefits of genetic testing for sudden cardiac arrest, Jancie would like to pursue genetic testing for sudden cardiac arrest and signed the informed consent form. Blood was drawn today for testing.    Sidney Ace, Ph.D, [facility] Clinical Molecular Geneticist

## 2024-02-22 ENCOUNTER — Encounter: Payer: Self-pay | Admitting: Internal Medicine

## 2024-02-25 ENCOUNTER — Ambulatory Visit: Payer: 59

## 2024-02-25 NOTE — Progress Notes (Signed)
  Electrophysiology Office Note:   Date:  02/26/2024  ID:  Thayne Fine, DOB [birth year], MRN [id number]  Primary Cardiologist: None Primary Heart Failure: None Electrophysiologist: Richardo Chandler, MD       History of Present Illness:   Julie Saunders is a 38 y.o. female with h/o cardiac arrest s/p ICD 2015 (no specific cause identified), recurrent palpitations s/p EPS with RFCA, ?questionable familial hx of hypokalemia / mother deceased of "heart attack" at age 13, Chron's Disease s/p partial small bowel resection with primary anastomosis seen today for routine electrophysiology followup.   Since last being seen in our clinic the patient reports doing well overall. She is dealing with Seton drains from issues with her Chron's.  She does not drive. She has no device specific concerns.   She denies chest pain, palpitations, dyspnea, PND, orthopnea, nausea, vomiting, dizziness, syncope, edema, weight gain, or early satiety.   Review of systems complete and found to be negative unless listed in HPI.    EP Information / Studies Reviewed:    EKG is ordered today. Personal review as below.  EKG Interpretation Date/Time:  Friday February 26 2024 09:11:23 EDT Ventricular Rate:  77 PR Interval:  150 QRS Duration:  98 QT Interval:  376 QTC Calculation: 425 R Axis:   -28  Text Interpretation: Normal sinus rhythm Confirmed by Creighton Doffing ([id number]) on 02/26/2024 9:14:14 AM   ICD Interrogation-  reviewed in detail today,  See PACEART report.  Device History: Abbott Dual Chamber ICD implanted 09/13/2014 for VF Arrest / NICM History of appropriate therapy: Yes & inappropriate History of AAD therapy: No   Studies:  Myoview 2015 > LVEF 52% ECHO 2021 > LVEF 35-40% ECHO 09/2023 > LVEF 45-50%    Arrhythmia / AAD SVT s/p RFCA    Physical Exam:   VS:  BP 120/78   Pulse 77   Ht 5\' 5"  (1.651 m)   Wt 212 lb (96.2 kg)   SpO2 97%   BMI 35.28 kg/m    Wt Readings from Last 3 Encounters:   02/26/24 212 lb (96.2 kg)  12/02/23 205 lb (93 kg)  10/30/23 202 lb (91.6 kg)     GEN: Well nourished, well developed in no acute distress NECK: No JVD; No carotid bruits CARDIAC: Regular rate and rhythm, no murmurs, rubs, gallops. No tethering, ICD site wnl RESPIRATORY:  Clear to auscultation without rales, wheezing or rhonchi  ABDOMEN: Soft, non-tender, non-distended EXTREMITIES:  No edema; No deformity   ASSESSMENT AND PLAN:    Chronic Systolic Dysfunction s/p Abbott dual chamber ICD  Cardiac Arrest 2015 / VT  -euvolemic today -Stable on an appropriate medical regimen / GDMT limited by BP -Normal ICD function -See Pace Art report -No changes today -at [facility] as of 02/06/24, scheduled for generator change, procedure details discussed with patient  -pt has an Abbott generator and MDT leads, consider MDT generator for unified system in the event she were to need MRI imaging in the future  -EKG with narrow QRS, LVEF stable   SVT s/p RFCA  -no atrial arrhythmias on device     Hypertension  -well controlled on current regimen     Disposition:   Follow up with Dr. Rodolfo Clan  as planned for generator change on 04/19/24     Signed, Creighton Doffing, NP-C, AGACNP-BC [facility] - Electrophysiology  02/26/2024, 5:05 PM

## 2024-02-26 ENCOUNTER — Ambulatory Visit: Attending: Cardiology | Admitting: Pulmonary Disease

## 2024-02-26 ENCOUNTER — Encounter: Payer: Self-pay | Admitting: Pulmonary Disease

## 2024-02-26 ENCOUNTER — Other Ambulatory Visit: Payer: Self-pay

## 2024-02-26 VITALS — BP 120/78 | HR 77 | Ht 65.0 in | Wt 212.0 lb

## 2024-02-26 DIAGNOSIS — I472 Ventricular tachycardia, unspecified: Secondary | ICD-10-CM

## 2024-02-26 DIAGNOSIS — I5022 Chronic systolic (congestive) heart failure: Secondary | ICD-10-CM

## 2024-02-26 DIAGNOSIS — I469 Cardiac arrest, cause unspecified: Secondary | ICD-10-CM

## 2024-02-26 DIAGNOSIS — I10 Essential (primary) hypertension: Secondary | ICD-10-CM

## 2024-02-26 DIAGNOSIS — I428 Other cardiomyopathies: Secondary | ICD-10-CM

## 2024-02-26 DIAGNOSIS — I471 Supraventricular tachycardia, unspecified: Secondary | ICD-10-CM

## 2024-02-26 DIAGNOSIS — Z95810 Presence of automatic (implantable) cardiac defibrillator: Secondary | ICD-10-CM

## 2024-02-26 LAB — CUP PACEART INCLINIC DEVICE CHECK
Date Time Interrogation Session: 20250418170627
Implantable Lead Connection Status: 753985
Implantable Lead Connection Status: 753985
Implantable Lead Implant Date: 20151104
Implantable Lead Implant Date: 20151104
Implantable Lead Location: 753859
Implantable Lead Location: 753860
Implantable Lead Model: 5076
Implantable Pulse Generator Implant Date: 20151104
Pulse Gen Serial Number: 7179320

## 2024-02-26 NOTE — Patient Instructions (Signed)
 Medication Instructions:  Your physician recommends that you continue on your current medications as directed. Please refer to the Current Medication list given to you today.  *If you need a refill on your cardiac medications before your next appointment, please call your pharmacy*  Lab Work: NONE If you have labs (blood work) drawn today and your tests are completely normal, you will receive your results only by: MyChart Message (if you have MyChart) OR A paper copy in the mail If you have any lab test that is abnormal or we need to change your treatment, we will call you to review the results.  Testing/Procedures: NONE  Follow-Up: At [facility], you and your health needs are our priority.  As part of our continuing mission to provide you with exceptional heart care, our providers are all part of one team.  This team includes your primary Cardiologist (physician) and Advanced Practice Providers or APPs (Physician Assistants and Nurse Practitioners) who all work together to provide you with the care you need, when you need it.  Your next appointment:   KEEP SCHEDULED FOLLOW-UP (SEE DISCHARGE SUMMARY)  We recommend signing up for the patient portal called "MyChart".  Sign up information is provided on this After Visit Summary.  MyChart is used to connect with patients for Virtual Visits (Telemedicine).  Patients are able to view lab/test results, encounter notes, upcoming appointments, etc.  Non-urgent messages can be sent to your provider as well.   To learn more about what you can do with MyChart, go to [email or web address].   Other Instructions       1st Floor: - Lobby - Registration  - Pharmacy  - Lab - Cafe  2nd Floor: - PV Lab - Diagnostic Testing (echo, CT, nuclear med)  3rd Floor: - Vacant  4th Floor: - TCTS (cardiothoracic surgery) - AFib Clinic - Structural Heart Clinic - Vascular Surgery  - Vascular Ultrasound  5th Floor: - HeartCare  Cardiology (general and EP) - Clinical Pharmacy for coumadin, hypertension, lipid, weight-loss medications, and med management appointments    Valet parking services will be available as well.

## 2024-02-26 NOTE — Addendum Note (Signed)
 Addended by: Kiaria Quinnell on: 02/26/2024 10:06 AM   Modules accepted: Orders

## 2024-02-26 NOTE — Telephone Encounter (Signed)
 Pt is scheduled for ICD gen change with Dr. Rodolfo Clan on 6/10 at 7:30 am. She was given surgical scrub today.   I went over instructions with her in detail today while she was here for her o/v with Brandi.

## 2024-02-29 ENCOUNTER — Ambulatory Visit (INDEPENDENT_AMBULATORY_CARE_PROVIDER_SITE_OTHER): Payer: 59

## 2024-02-29 DIAGNOSIS — I469 Cardiac arrest, cause unspecified: Secondary | ICD-10-CM

## 2024-02-29 DIAGNOSIS — I472 Ventricular tachycardia, unspecified: Secondary | ICD-10-CM

## 2024-03-01 LAB — CUP PACEART REMOTE DEVICE CHECK
Battery Remaining Longevity: 0 mo
Battery Voltage: 2.59 V
Brady Statistic AP VP Percent: 1 %
Brady Statistic AP VS Percent: 20 %
Brady Statistic AS VP Percent: 1 %
Brady Statistic AS VS Percent: 79 %
Brady Statistic RA Percent Paced: 18 %
Brady Statistic RV Percent Paced: 1 %
Date Time Interrogation Session: 20250421020015
HighPow Impedance: 77 Ohm
HighPow Impedance: 77 Ohm
Implantable Lead Connection Status: 753985
Implantable Lead Connection Status: 753985
Implantable Lead Implant Date: 20151104
Implantable Lead Implant Date: 20151104
Implantable Lead Location: 753859
Implantable Lead Location: 753860
Implantable Lead Model: 5076
Implantable Pulse Generator Implant Date: 20151104
Lead Channel Impedance Value: 340 Ohm
Lead Channel Impedance Value: 490 Ohm
Lead Channel Pacing Threshold Amplitude: 0.5 V
Lead Channel Pacing Threshold Amplitude: 1 V
Lead Channel Pacing Threshold Pulse Width: 0.4 ms
Lead Channel Pacing Threshold Pulse Width: 0.4 ms
Lead Channel Sensing Intrinsic Amplitude: 11.8 mV
Lead Channel Sensing Intrinsic Amplitude: 4.4 mV
Lead Channel Setting Pacing Amplitude: 2 V
Lead Channel Setting Pacing Amplitude: 2.5 V
Lead Channel Setting Pacing Pulse Width: 0.4 ms
Lead Channel Setting Sensing Sensitivity: 0.5 mV
Pulse Gen Serial Number: 7179320

## 2024-03-04 ENCOUNTER — Telehealth (HOSPITAL_COMMUNITY): Payer: Self-pay | Admitting: Internal Medicine

## 2024-03-04 NOTE — Telephone Encounter (Signed)
 Called to confirm/remind patient of their appointment at the [facility] on 03/04/24.   Appointment:   [] Confirmed  [x] Left mess   [] No answer/No voice mail  [] VM Full/unable to leave message  [] Phone not in service  Patient reminded to bring all medications and/or complete list.  Confirmed patient has transportation. Gave directions, instructed to utilize valet parking.

## 2024-03-07 ENCOUNTER — Encounter (HOSPITAL_COMMUNITY): Payer: Self-pay | Admitting: Internal Medicine

## 2024-03-07 ENCOUNTER — Ambulatory Visit (HOSPITAL_COMMUNITY)
Admission: RE | Admit: 2024-03-07 | Discharge: 2024-03-07 | Disposition: A | Discharge status: Home / Self Care | Source: Ambulatory Visit | Attending: Internal Medicine | Admitting: Internal Medicine

## 2024-03-07 VITALS — BP 130/80 | HR 77 | Wt 214.6 lb

## 2024-03-07 DIAGNOSIS — G43909 Migraine, unspecified, not intractable, without status migrainosus: Secondary | ICD-10-CM | POA: Insufficient documentation

## 2024-03-07 DIAGNOSIS — Z79899 Other long term (current) drug therapy: Secondary | ICD-10-CM | POA: Insufficient documentation

## 2024-03-07 DIAGNOSIS — K50919 Crohn's disease, unspecified, with unspecified complications: Secondary | ICD-10-CM

## 2024-03-07 DIAGNOSIS — R42 Dizziness and giddiness: Secondary | ICD-10-CM | POA: Insufficient documentation

## 2024-03-07 DIAGNOSIS — Z9049 Acquired absence of other specified parts of digestive tract: Secondary | ICD-10-CM | POA: Insufficient documentation

## 2024-03-07 DIAGNOSIS — I5022 Chronic systolic (congestive) heart failure: Secondary | ICD-10-CM | POA: Diagnosis present

## 2024-03-07 DIAGNOSIS — I472 Ventricular tachycardia, unspecified: Secondary | ICD-10-CM | POA: Diagnosis not present

## 2024-03-07 DIAGNOSIS — I10 Essential (primary) hypertension: Secondary | ICD-10-CM | POA: Diagnosis not present

## 2024-03-07 DIAGNOSIS — Z8674 Personal history of sudden cardiac arrest: Secondary | ICD-10-CM | POA: Diagnosis not present

## 2024-03-07 DIAGNOSIS — I428 Other cardiomyopathies: Secondary | ICD-10-CM | POA: Insufficient documentation

## 2024-03-07 DIAGNOSIS — I471 Supraventricular tachycardia, unspecified: Secondary | ICD-10-CM | POA: Insufficient documentation

## 2024-03-07 DIAGNOSIS — I110 Hypertensive heart disease with heart failure: Secondary | ICD-10-CM | POA: Insufficient documentation

## 2024-03-07 DIAGNOSIS — Z95810 Presence of automatic (implantable) cardiac defibrillator: Secondary | ICD-10-CM | POA: Diagnosis not present

## 2024-03-07 DIAGNOSIS — K5090 Crohn's disease, unspecified, without complications: Secondary | ICD-10-CM | POA: Insufficient documentation

## 2024-03-07 MED ORDER — LOSARTAN POTASSIUM 25 MG PO TABS: 25.0000 mg | ORAL_TABLET | Freq: Two times a day (BID) | ORAL | 3 refills | Status: DC

## 2024-03-07 NOTE — Patient Instructions (Signed)
 Great to see you today!!!  Medication Changes:  INCREASE Losartan  to 25 mg Twice daily   Special Instructions // Education:  Do the following things EVERYDAY: Weigh yourself in the morning before breakfast. Write it down and keep it in a log. Take your medicines as prescribed Eat low salt foods--Limit salt (sodium) to 2000 mg per day.  Stay as active as you can everyday Limit all fluids for the day to less than 2 liters   Follow-Up in: 9 months (January 2026), **PLEASE CALL OUR OFFICE IN NOVEMBER TO SCHEDULE THIS APPOINTMENT   At the [facility], you and your health needs are our priority. We have a designated team specialized in the treatment of Heart Failure. This Care Team includes your primary Heart Failure Specialized Cardiologist (physician), Advanced Practice Providers (APPs- Physician Assistants and Nurse Practitioners), and Pharmacist who all work together to provide you with the care you need, when you need it.   You may see any of the following providers on your designated Care Team at your next follow up:  Dr. Jules Oar Dr. Peder Bourdon Dr. Alwin Baars Dr. Judyth Nunnery Nieves Bars, NP Ruddy Corral, [place] [facility], [place] Dennise Fitz, NP [place] Lee, NP Luster Salters, PharmD   Please be sure to bring in all your medications bottles to every appointment.   Need to Contact Us :  If you have any questions or concerns before your next appointment please send us  a message through [place] or call our office at [phone].    TO LEAVE A MESSAGE FOR THE NURSE SELECT OPTION 2, PLEASE LEAVE A MESSAGE INCLUDING: YOUR NAME DATE OF BIRTH CALL BACK NUMBER REASON FOR CALL**this is important as we prioritize the call backs  YOU WILL RECEIVE A CALL BACK THE SAME DAY AS LONG AS YOU CALL BEFORE 4:00 PM

## 2024-03-07 NOTE — Progress Notes (Signed)
 [facility]  PCP: Benedetto Brady, MD EP: Richardo Chandler HF Cardiologist: Dr. Julane Ny  HPI: 38 y.o. female with PMH of Crohn's disease, migraine headaches, Chronic Systolic CHF, SVT s/p ablation 06/08/15, Vtach, presumed long QT, s/p ICD implant. Sees Neurology for vertigo and headaches.  Initially she presented to [facility] ER on 08/31/14 after collapse at home in the setting of prolonged diarrhea 2/2 crohn's disease.  Arrived with K of 2.6. She was unresponsive for approx. 10-15 minutes before fire department arrived and initiated CPR for 15 minutes until EMS arrived and initiated ACLS with CPR for 15 more minutes prior to ROSC.  Pt was defibrillated 4x for vfib, and received 4 rounds of epinephrine , 150 mg amiodarone , narcan , D50, and was intubated.  10/24- rewarmed, followed commands.  She had an ECHO 08/31/14 with EF 25-30% which was thought to be secondary to her arrest, given that her repeat on 09/08/14 EF improved to 60%. Post cardiac arrest she developed sinus bradycardia and ATN.  Because of her acute kidney injury secondary to the arrest she could not undergo cardiac catheterization.  09/11/14 had epinephrine  infusion observation with rare ectopic beats and stable QT at .  Also had flecainide  brugada challenge which was negative.  Stress Myoview on 09/12/14 showed no evidence of prior infarct or ischemia, mild diffuse global hypokinesis, EF 52%.  She underwent dual chamber St Jude  ICD on 09/11/14.    12/08/2014 defibrillator discharged and she went to the ED interrogation revealed episode of polymorphic Vtach followed by SVT/atrial tach initial rate 240. EP consulted and restarted her propanolol (was stopped at outpatient visit due to dizziness) and to follow up with Dr. Rodolfo Clan.  Dr. Rodolfo Clan increased propanolol to 120 mg and performed SVT ablation 06/08/15.    08/2016 came to her outpatient visit with Dr. Rodolfo Clan and was severely hypotensive found to have lost 24 additional pounds  related to Crohn's. No further SVT or Vtach noted.  Propanolol d/ced.  Admitted for failure to thrive 10/2016 at [facility] up to 50lb weight loss at that time started on steroids, TPN.    ECHO studies stable normal EF until 11/06/2020 where her EF dropped down to 35-40%.    We saw her for the first time in 5/22.  EF 45-50% and than symptoms related more to deconditioning than HF. Referred to our exercise physiologist who was coaching her. Participated in PT earlier this year. Discharged d/t lack of progress.   Echo 11/22 EF 55%  Had bowel resection in 3/23 at [facility].  Echo 12/23: EF 45%  Echo 11/24 EF 45-50% RV normal Personally reviewed  Today she returns for HF follow up. Doing pretty good. Still with some occasional palpitations. Can do all actiivities with only mild DOE. No edema, orthopnea or PND. SBP running 130-140s. Just finished a course of Cipro (60 days) for het Crohn's.   ICD interrogation: No VT/AF. Volume dry. Device at [employer]. Has changeout scheduled for 04/19/24.     FH:  Some Asthma in family, Grandmother, Uncle  Mother with MVP, MI at age 64  Past Medical History:  Diagnosis Date   AICD (automatic cardioverter/defibrillator) present 09/14/2015   STJ dual chamber ICD implanted by Dr Rodolfo Clan for aborted cardiac arrest   Allergy    Bradycardia    Cardiac arrest (HCC) 08/31/2014   Crohn disease (HCC)    Depression    History of blood transfusion 09/2012   "had allergic rxn to one of my Crohn's RX"  Migraine    "a few times/yr" (06/08/2015)   Pancytopenia (HCC) 11/23/2012   Status post radiofrequency ablation for arrhythmia, atrial tach 06/08/15 06/08/2015   SVT (supraventricular tachycardia) (HCC) 12/08/2014   likely an atrial tachycardia with CL 250 msec    Current Outpatient Medications  Medication Sig Dispense Refill   acetaminophen  (TYLENOL ) 325 MG tablet Take 650 mg by mouth every 6 (six) hours as needed for headache.     cetirizine (ZYRTEC) 10 MG tablet Take 10 mg by  mouth daily.     Cholecalciferol (VITAMIN D3) 50 MCG (2000 UT) CHEW Chew by mouth daily.     Cobalamin Combinations (B-12) [phone] MCG SUBL Place 1 tablet under the tongue daily.     colestipol (COLESTID) 1 g tablet Take 1 g by mouth as needed.     diazepam  (VALIUM ) 5 MG tablet TAKE 1 TABLET (5 MG TOTAL) BY MOUTH EVERY 12 (TWELVE) HOURS AS NEEDED (VERTIGPO). 10 tablet 0   Galcanezumab -gnlm (EMGALITY ) 120 MG/ML SOAJ Inject 120 mg into the skin every 30 (thirty) days. 1 mL 1   losartan  (COZAAR ) 25 MG tablet Take 1 tablet (25 mg total) by mouth daily. 90 tablet 3   ondansetron  (ZOFRAN ) 4 MG tablet Take 4 mg by mouth as needed for nausea or vomiting.     Pediatric Multiple Vit-C-FA (ANIMAL CHEWABLE MULTIVITAMIN PO) Take 1 tablet by mouth daily.     potassium chloride  SA (KLOR-CON  M) 20 MEQ tablet Take 1 tablet (20 mEq total) by mouth daily. 90 tablet 1   SKYRIZI 360 MG/2.4ML SOCT Inject into the skin every 8 (eight) weeks.     spironolactone  (ALDACTONE ) 25 MG tablet TAKE 1 TABLET (25 MG TOTAL) BY MOUTH DAILY. 90 tablet 1   No current facility-administered medications for this encounter.   Allergies  Allergen Reactions   Sulfa Antibiotics Rash   Sulfacetamide Sodium Rash   Social History   Socioeconomic History   Marital status: Single    Spouse name: Not on file   Number of children: Not on file   Years of education: Not on file   Highest education level: Not on file  Occupational History   Not on file  Tobacco Use   Smoking status: Never   Smokeless tobacco: Never  Vaping Use   Vaping status: Never Used  Substance and Sexual Activity   Alcohol use: Yes    Comment: occasional/social   Drug use: No   Sexual activity: Never  Other Topics Concern   Not on file  Social History Narrative   Caffeine: sodas 2/day, some tea    Social Drivers of Corporate investment banker Strain: Not on file  Food Insecurity: Not on file  Transportation Needs: Not on file  Physical Activity:  Not on file  Stress: Not on file  Social Connections: Not on file  Intimate Partner Violence: Not on file   Family History  Problem Relation Age of Onset   Heart attack Mother    Migraines Mother    Heart disease Mother    Heart attack Maternal Grandmother    Heart disease Maternal Grandmother    Diabetes Maternal Grandfather    Asthma Paternal Grandmother    Diabetes Paternal Grandfather    BP 130/80   Pulse 77   Wt 97.3 kg (214 lb 9.6 oz)   SpO2 96%   BMI 35.71 kg/m   Wt Readings from Last 3 Encounters:  03/07/24 97.3 kg (214 lb 9.6 oz)  02/26/24 96.2 kg (  212 lb)  12/02/23 93 kg (205 lb)   Physical Exam General:  Well appearing. No resp difficulty HEENT: normal Neck: supple. no JVD. Carotids 2+ bilat; no bruits. No lymphadenopathy or thryomegaly appreciated. Cor:  Regular rate & rhythm. No rubs, gallops or murmurs. Lungs: clear Abdomen: obese soft, nontender, nondistended.  No bruits or masses. Good bowel sounds. Extremities: no cyanosis, clubbing, rash, edema Neuro: alert & orientedx3, cranial nerves grossly intact. moves all 4 extremities w/o difficulty. Affect pleasant   ASSESSMENT & PLAN:  1. 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 - Repeat echo (09/08/14): EF improved to 60%  - Could not undergo LHC due to ATN from arrest, she had dual chamber St. Jude ICD implanted 09/13/14 - Subsequent echo studies with normal EF through 2020 low normal at 50-55%  - Echo 11/06/2020 EF read as 35-40% - EF from 12/21 Likely ~45% if not 45-50% with normal diastolic function - Echo (11/22): EF 55% - Echo (12/23): EF 45% - Echo 11/24 EF 45-50% RV normal Personally reviewed - Stable NYHA II Volume status ok. GDMT has been limited by dizziness. - Increase losartan  25 daily -> 25 bid (failed Entresto  due to  dizziness)  - Continue spiro 25 mg qhs. - Not able to tolerate coreg  and propanolol in the past d/t dizziness and orthostasis - Unable to tolerate Jardiance  due to dizziness. - Labs in 2/25 ok K 3.7 Scr 1.13. Has upcoming labs for ICD changeout   2. Crohn's Disease: - Now followed by [facility] - s/p bowel resection at [facility] in 3/23. - just finished course of cipro - currently stable  3. H/o SVT: - felt to be atrial tach had an ablation 06/08/15 - no recurrence on device - stable  4. HTN - BP elevated. Increase losartan  as above  5. ICD at [facility]  Has changeout scheduled for 04/19/24.     Jules Oar, MD  03/07/24 3:09 PM

## 2024-03-10 NOTE — Progress Notes (Signed)
 Remote ICD transmission.

## 2024-03-10 NOTE — Addendum Note (Signed)
 Addended by: Lott Rouleau A on: 03/10/2024 03:05 PM   Modules accepted: Orders

## 2024-03-13 ENCOUNTER — Encounter: Payer: Self-pay | Admitting: Cardiology

## 2024-03-14 ENCOUNTER — Telehealth: Payer: Self-pay

## 2024-03-14 ENCOUNTER — Encounter: Payer: Self-pay | Admitting: Internal Medicine

## 2024-03-14 NOTE — Telephone Encounter (Signed)
 Pt is scheduled to have ICD Gen change and was prescribed Triamcinolone ointment 0.1% for rash under her arm pit and wanted us  to be aware.   If it worsens or anything changes she will let us  know.

## 2024-03-15 ENCOUNTER — Telehealth: Payer: Self-pay

## 2024-03-15 DIAGNOSIS — I5022 Chronic systolic (congestive) heart failure: Secondary | ICD-10-CM

## 2024-03-15 NOTE — Telephone Encounter (Signed)
 I called pt to inform her that her ICD Gen Change procedure with Dr. Rodolfo Clan on 6/10 would need to be rescheduled with another MD - She is now scheduled on 5/16 at 12:30 PM with Dr. Daneil Dunker.   She will get updated labs before 5/14.  I have sent updated instruction letter to pt via MyChart.

## 2024-03-16 LAB — CBC

## 2024-03-17 LAB — BASIC METABOLIC PANEL WITH GFR
BUN/Creatinine Ratio: 12 (ref 9–23)
BUN: 11 mg/dL (ref 6–20)
CO2: 20 mmol/L (ref 20–29)
Calcium: 9.1 mg/dL (ref 8.7–10.2)
Chloride: 98 mmol/L (ref 96–106)
Creatinine, Ser: 0.95 mg/dL (ref 0.57–1.00)
Glucose: 90 mg/dL (ref 70–99)
Potassium: 3.9 mmol/L (ref 3.5–5.2)
Sodium: 137 mmol/L (ref 134–144)
eGFR: 79 mL/min/{1.73_m2} (ref >59)

## 2024-03-17 LAB — CBC
Hematocrit: 39.9 % (ref 34.0–46.6)
Hemoglobin: 13.4 g/dL (ref 11.1–15.9)
MCH: 29.8 pg (ref 26.6–33.0)
MCHC: 33.6 g/dL (ref 31.5–35.7)
MCV: 89 fL (ref 79–97)
Platelets: 271 10*3/uL (ref 150–450)
RBC: 4.49 x10E6/uL (ref 3.77–5.28)
RDW: 12.4 % (ref 11.7–15.4)
WBC: 12.5 10*3/uL — ABNORMAL HIGH (ref 3.4–10.8)

## 2024-03-18 ENCOUNTER — Telehealth (HOSPITAL_COMMUNITY): Payer: Self-pay

## 2024-03-18 NOTE — Telephone Encounter (Signed)
 Spoke with patient to discuss upcoming procedure.   Confirmed patient is scheduled for a ICD generator change on Friday, May 16 with Dr. Clinton Danas. Instructed patient to arrive at the Main Entrance A at [facility]: [address] [place], [place] [address] and check in at Admitting at 10:30 AM.   Labs completed  Any recent signs of acute illness or been started on antibiotics? May 1- anorectal procedure- placement of seton for anal fistula. Reports she completed a 60-day course of ciprofloxacin for small intestinal bacterial growth. Last dose 2 weeks ago.  Any new medications started? No Medication instructions:  On the morning of your procedure take all morning medications except Spironolactone  with small sip of water. No eating or drinking after midnight prior to procedure.   The night before your procedure and the morning of your procedure, wash thoroughly with the CHG surgical soap from the neck down, paying special attention to the area where your procedure will be performed.  Advised of plan to go home the same day and will only stay overnight if medically necessary. You MUST have a responsible adult to drive you home and MUST be with you the first 24 hours after you arrive home.  Patient verbalized understanding to all instructions provided and agreed to proceed with procedure.

## 2024-03-18 NOTE — Telephone Encounter (Signed)
 Attempted to reach patient to discuss upcoming procedure, no answer. Left VM for patient to return call.

## 2024-03-25 ENCOUNTER — Ambulatory Visit (HOSPITAL_COMMUNITY)
Admission: RE | Admit: 2024-03-25 | Discharge: 2024-03-25 | Disposition: A | Discharge status: Home / Self Care | Attending: Cardiology | Admitting: Cardiology

## 2024-03-25 ENCOUNTER — Other Ambulatory Visit: Payer: Self-pay

## 2024-03-25 ENCOUNTER — Encounter (HOSPITAL_COMMUNITY)
Admission: RE | Disposition: A | Discharge status: Home / Self Care | Payer: Self-pay | Source: Home / Self Care | Attending: Cardiology

## 2024-03-25 DIAGNOSIS — Z4502 Encounter for adjustment and management of automatic implantable cardiac defibrillator: Secondary | ICD-10-CM | POA: Diagnosis present

## 2024-03-25 DIAGNOSIS — Z8674 Personal history of sudden cardiac arrest: Secondary | ICD-10-CM | POA: Diagnosis not present

## 2024-03-25 DIAGNOSIS — Z95810 Presence of automatic (implantable) cardiac defibrillator: Secondary | ICD-10-CM

## 2024-03-25 HISTORY — PX: ICD GENERATOR CHANGEOUT: EP1231

## 2024-03-25 LAB — PREGNANCY, URINE: Preg Test, Ur: NEGATIVE

## 2024-03-25 SURGERY — ICD GENERATOR CHANGEOUT

## 2024-03-25 MED ORDER — ACETAMINOPHEN 325 MG PO TABS: 325.0000 mg | ORAL_TABLET | ORAL | Status: DC | PRN

## 2024-03-25 MED ORDER — MIDAZOLAM HCL 5 MG/5ML IJ SOLN
INTRAMUSCULAR | Status: DC | PRN
  Administered 2024-03-25 (×3): 1 mg via INTRAVENOUS

## 2024-03-25 MED ORDER — CEFAZOLIN SODIUM-DEXTROSE 2-4 GM/100ML-% IV SOLN
2.0000 g | INTRAVENOUS | Status: AC
  Administered 2024-03-25: 2 g via INTRAVENOUS

## 2024-03-25 MED ORDER — LIDOCAINE HCL (PF) 1 % IJ SOLN
INTRAMUSCULAR | Status: DC | PRN
  Administered 2024-03-25: 60 mL

## 2024-03-25 MED ORDER — LIDOCAINE HCL 1 % IJ SOLN
INTRAMUSCULAR | Status: AC
  Filled 2024-03-25: qty 60

## 2024-03-25 MED ORDER — SODIUM CHLORIDE 0.9 % IV SOLN: 250.0000 mL | INTRAVENOUS | Status: DC

## 2024-03-25 MED ORDER — POVIDONE-IODINE 10 % EX SWAB
2.0000 | Freq: Once | CUTANEOUS | Status: AC
  Administered 2024-03-25: 2 via TOPICAL

## 2024-03-25 MED ORDER — CEFAZOLIN SODIUM-DEXTROSE 2-4 GM/100ML-% IV SOLN
INTRAVENOUS | Status: AC
  Filled 2024-03-25: qty 100

## 2024-03-25 MED ORDER — SODIUM CHLORIDE 0.9 % IV SOLN
80.0000 mg | INTRAVENOUS | Status: DC
  Administered 2024-03-25: 80 mg

## 2024-03-25 MED ORDER — FENTANYL CITRATE (PF) 100 MCG/2ML IJ SOLN
INTRAMUSCULAR | Status: DC | PRN
  Administered 2024-03-25 (×3): 50 ug via INTRAVENOUS

## 2024-03-25 MED ORDER — SODIUM CHLORIDE 0.9% FLUSH: 3.0000 mL | Freq: Two times a day (BID) | INTRAVENOUS | Status: DC

## 2024-03-25 MED ORDER — SODIUM CHLORIDE 0.9 % IV SOLN
INTRAVENOUS | Status: AC
  Filled 2024-03-25: qty 2

## 2024-03-25 MED ORDER — MIDAZOLAM HCL 5 MG/5ML IJ SOLN
INTRAMUSCULAR | Status: AC
  Filled 2024-03-25: qty 5

## 2024-03-25 MED ORDER — ONDANSETRON HCL 4 MG/2ML IJ SOLN: 4.0000 mg | Freq: Four times a day (QID) | INTRAMUSCULAR | Status: DC | PRN

## 2024-03-25 MED ORDER — SODIUM CHLORIDE 0.9 % IV SOLN: INTRAVENOUS | Status: DC

## 2024-03-25 MED ORDER — FENTANYL CITRATE (PF) 100 MCG/2ML IJ SOLN
INTRAMUSCULAR | Status: AC
  Filled 2024-03-25: qty 2

## 2024-03-25 MED ORDER — CHLORHEXIDINE GLUCONATE 4 % EX SOLN
4.0000 | Freq: Once | CUTANEOUS | Status: DC
  Filled 2024-03-25: qty 60

## 2024-03-25 MED ORDER — SODIUM CHLORIDE 0.9% FLUSH: 3.0000 mL | INTRAVENOUS | Status: DC | PRN

## 2024-03-25 SURGICAL SUPPLY — 6 items
CABLE SURGICAL S-101-97-12 (CABLE) ×1 IMPLANT
ICD GALLANT DR CDDRA500Q (ICD Generator) IMPLANT
PAD DEFIB RADIO PHYSIO CONN (PAD) ×1 IMPLANT
POUCH AIGIS-R ANTIBACT ICD (Mesh General) ×1 IMPLANT
POUCH AIGIS-R ANTIBACT ICD LRG (Mesh General) IMPLANT
TRAY PACEMAKER INSERTION (PACKS) ×1 IMPLANT

## 2024-03-25 NOTE — Discharge Instructions (Signed)

## 2024-03-25 NOTE — H&P (Signed)
 Cardiology Consultation   Patient ID: Julie Saunders MRN: [id number]; DOB: Sep 08, 1986  Admit date: 03/25/2024 Date of Consult: 03/25/2024  PCP:  Benedetto Brady, MD   [facility] Providers Cardiologist:  None  Electrophysiologist:  Richardo Chandler, MD       History of Present Illness:   Julie Saunders is a 38 y.o. female with h/o cardiac arrest s/p ICD 2015 (no specific cause identified), recurrent palpitations s/p EPS with RFCA, ?questionable familial hx of hypokalemia / mother deceased of "heart attack" at age 59, Chron's Disease s/p partial small bowel resection with primary anastomosis who presents today for ICD generator change. She reports feeling relatively well today. No active infection and no current antibiotics.    Past Medical History:  Diagnosis Date   AICD (automatic cardioverter/defibrillator) present 09/14/2015   STJ dual chamber ICD implanted by Dr Rodolfo Clan for aborted cardiac arrest   Allergy    Bradycardia    Cardiac arrest (HCC) 08/31/2014   Crohn disease (HCC)    Depression    History of blood transfusion 09/2012   "had allergic rxn to one of my Crohn's RX"    Migraine    "a few times/yr" (06/08/2015)   Pancytopenia (HCC) 11/23/2012   Status post radiofrequency ablation for arrhythmia, atrial tach 06/08/15 06/08/2015   SVT (supraventricular tachycardia) (HCC) 12/08/2014   likely an atrial tachycardia with CL 250 msec    Past Surgical History:  Procedure Laterality Date   : LAPAROSCOPY, SURGICAL; COLECTOMY, PARTIAL, WITH REMOVAL OF TERMINAL ILEUM WITH ILEOCOLOSTOMY 02/06/2022  02/06/2022   ABLATION     ELECTROPHYSIOLOGIC STUDY N/A 06/08/2015   Procedure: SVT Ablation;  Surgeon: Jolly Needle, MD;  Location: [facility] INVASIVE CV LAB;  Service: Cardiovascular;  Laterality: N/A;   ELECTROPHYSIOLOGY STUDY N/A 09/11/2014   Procedure: ELECTROPHYSIOLOGY STUDY;  Surgeon: Verona Goodwill, MD;  Location: [facility] CATH LAB;  Service: Cardiovascular;  Laterality: N/A;    IMPLANTABLE CARDIOVERTER DEFIBRILLATOR IMPLANT N/A 09/13/2014   Procedure: IMPLANTABLE CARDIOVERTER DEFIBRILLATOR IMPLANT;  Surgeon: Verona Goodwill, MD;  Location: [facility] CATH LAB;  Service: Cardiovascular;  Laterality: N/A;; STJ dual chamber ICD implanted by Dr Rodolfo Clan for aborted cardiac arrest      Inpatient Medications: Scheduled Meds:  chlorhexidine   4 Application Topical Once   gentamicin  (GARAMYCIN ) 80 mg in sodium chloride  0.9 % 500 mL irrigation  80 mg Irrigation On Call   sodium chloride  flush  3 mL Intravenous Q12H   Continuous Infusions:  sodium chloride      sodium chloride       ceFAZolin  (ANCEF ) IV     PRN Meds: sodium chloride  flush  Allergies:    Allergies  Allergen Reactions   Sulfa Antibiotics Rash    Social History:   Social History   Socioeconomic History   Marital status: Single    Spouse name: Not on file   Number of children: Not on file   Years of education: Not on file   Highest education level: Not on file  Occupational History   Not on file  Tobacco Use   Smoking status: Never   Smokeless tobacco: Never  Vaping Use   Vaping status: Never Used  Substance and Sexual Activity   Alcohol use: Yes    Comment: occasional/social   Drug use: No   Sexual activity: Never  Other Topics Concern   Not on file  Social History Narrative   Caffeine: sodas 2/day, some tea    Social Drivers of Corporate investment banker  Strain: Not on file  Food Insecurity: Not on file  Transportation Needs: Not on file  Physical Activity: Not on file  Stress: Not on file  Social Connections: Not on file  Intimate Partner Violence: Not on file    Family History:   Family History  Problem Relation Age of Onset   Heart attack Mother    Migraines Mother    Heart disease Mother    Heart attack Maternal Grandmother    Heart disease Maternal Grandmother    Diabetes Maternal Grandfather    Asthma Paternal Grandmother    Diabetes Paternal Grandfather      ROS:   Please see the history of present illness.   All other ROS reviewed and negative.     Physical Exam/Data:   Vitals:   03/25/24 1110 03/25/24 1112  BP:  (!) 143/90  Pulse:  82  Resp:  17  Temp: 99.8 F (37.7 C) 99.8 F (37.7 C)  TempSrc: Oral Oral  SpO2:  96%  Weight:  96.2 kg  Height:  5\' 5"  (1.651 m)   No intake or output data in the 24 hours ending 03/25/24 1134    03/25/2024   11:12 AM 03/07/2024    2:58 PM 02/26/2024    8:58 AM  Last 3 Weights  Weight (lbs) 212 lb 214 lb 9.6 oz 212 lb  Weight (kg) 96.163 kg 97.342 kg 96.163 kg     Body mass index is 35.28 kg/m.   General: Well developed, in no acute distress.  Neck: No JVD.  Cardiac: Normal rate, regular rhythm.  Resp: Normal work of breathing.  Ext: No edema.  Neuro: No gross focal deficits.  Psych: Normal affect.      Assessment and Plan:  #. ICD at [facility]:  Explained risks, benefits, and alternatives to generator change implantation, including but not limited to bleeding, infection, damage to nearby structures.  Pt verbalized understanding and agrees to proceed.  Signed, Ardeen Kohler, MD  03/25/2024 11:34 AM

## 2024-03-28 ENCOUNTER — Telehealth: Payer: Self-pay | Admitting: Cardiology

## 2024-03-28 ENCOUNTER — Encounter (HOSPITAL_COMMUNITY): Payer: Self-pay | Admitting: Cardiology

## 2024-03-28 NOTE — Telephone Encounter (Signed)
 Pt had a ICD gen change out on Friday and was in a lot a pain over the weekend, was taking xtra strength tylenol  for pain with minimal relief. She cannot take ibuprofen  due to Crohn's Disease. Can she take leftover oxycodone 5mg  or please advise

## 2024-03-28 NOTE — Telephone Encounter (Signed)
 Called and spoke with patient. Advised of ice therapy, ASA if tolerable, continue tylenol  and okay to take oxy as it was prescribed. I told patient she can call back if she continues to have issues.

## 2024-03-28 NOTE — Telephone Encounter (Signed)
 Will route this message to our device team for further assistance/management.

## 2024-03-29 NOTE — Progress Notes (Signed)
 PATIENT: Julie Saunders DOB: [birth year]  REASON FOR VISIT: follow up HISTORY FROM: patient Primary Neurologist: Dr. Albertina Hugger   Chief Complaint  Patient presents with   Follow-up    Pt in 20 Pt here for Migraine f/u Pt states migraine fine. Pt states constant vertigo      HISTORY OF PRESENT ILLNESS: Today 03/29/24:  Julie Saunders is a 38 y.o. female with a history of Migraine headaches and vertigo. Returns today for follow-up. Overall she reports things have remained stable. Since the last visit has not had a full migraine. Remains emgality . When she does get a headache will use tylenol . If she gets severe vertigo she will take valium . Valium  will calm the vertigo and migraine. Vertigo occurs on a daily basis. She may have a few days that she doesn't notice it. She repors that she uses Valium  rarely. Reports that she may go months without using it.  Vertigo Tiggers are movements and weather. Vestibular rehab helped. Tries to do exercises at home.   01/15/23: [name] is a 38 y.o. female with a history of Migraine Headaches and vertigo. Returns today for follow-up. Remains on Emgality  for her migraines.  She states so far this year she has not had a true migraine.  Emgality  seems to be working well.  She continues to have vertigo.  Every day she has some form of vertigo.  When she has a severe episode she will have to use the Valium .  Her last refill Valium  was in November for 10 tablets.  It is clear that she is not using it very often and only for severe episodes.   Has been to ENT in the past and saw Dr. Darlin Ehrlich. He believes that she has central and peripheral vertigo.  Recommended vestibular rehab  01/13/22: Julie Saunders is a 38 year old female with a history of migraine headaches and vertigo.  Headaches continue to respond well to Emgality .  Reports that she has not had a headache since 2020.  Reports that she occasionally has mild headaches but no migraines. patient states vertigo  is only relieved with therapy.  She tries to do the exercises she learned from physical therapy she does admit that she may not do them as consistent as she should.  She only takes Valium  for severe episodes usually associated with nausea.  01/10/21: Julie Saunders is a 38 year old female with a history of migraine headaches and vertigo.  She reports that her headaches have remained under good control with Emgality .  She states that she is not had a migraine since December 2020.  She has not had to use the sumatriptan .  At the last visit she was given a prescription of Valium  to use for vertigo.  She reports that that she only has used 4 tablets.  She continues to get sometimes daily other times weekly episodes of vertigo.  She reports sometimes the vertigo is mild for the severe episode she has to use Valium .  She tries to do the exercises she learned from vestibular rehab but she does not do them consistently.  She returns today for evaluation.   07/12/20: Julie Saunders is a 38 year old with a history of migraine headaches and vertigo.  She returns today for she states that she has not had a headache since December 2020.  Reports that Emgality  has been extremely beneficial for her.  She reports that her vertigo episode started to increase last month.  In the past she had a prescription  of Valium  from Dr. Brenita Callow.  She states that she did take a Valium  and that was beneficial.  She is continue to try to do the vestibular exercises she learned in vestibular rehab however admits that she has not been consistent with these exercises.  She returns today for an evaluation.  HISTORY Mrs Saunders reports good control of headaches with a lesser frequency , slightly less intensity and may be slightly shorter duration. Here for refills of Emgality - she keept a headache journal.  [name] were several month since may without any headaches !      Julie Saunders is a 38 year old female with a history of migraine headaches and  vertigo.  She returns today for follow-up.  She states that her headaches have been controlled with Emgality .  She states that she may have 1-2 headaches a month.  She states that there is been some months that she did have a headache at all.  Her headache location varies.  She does report photophobia and phonophobia with her migraines.  She also has nausea but no vomiting.  Denies any visual changes.  Denies any numbness and weakness in the extremities.  She reports that she does use sumatriptan  with good benefit.  She states that she still has vertigo.  This typically occurs with position changes.  She states that she will have some form of vertigo every day however the severity does vary.  She did complete vestibular rehab in the past.  She reports that she did get some benefit but not resolution.  She has not continue doing the exercises at home.  She states that she also was given Valium  in the past and that was beneficial as well.  She returns today for an evaluation.  REVIEW OF SYSTEMS: Out of a complete 14 system review of symptoms, the patient complains only of the following symptoms, and all other reviewed systems are negative.  See HPI  ALLERGIES: Allergies  Allergen Reactions   Sulfa Antibiotics Rash    HOME MEDICATIONS: Outpatient Medications Prior to Visit  Medication Sig Dispense Refill   acetaminophen  (TYLENOL ) 325 MG tablet Take 650 mg by mouth every 6 (six) hours as needed for headache.     cetirizine (ZYRTEC) 10 MG tablet Take 10 mg by mouth daily.     Cholecalciferol (VITAMIN D3) 50 MCG (2000 UT) CHEW Chew 2,000 Units by mouth daily.     colestipol (COLESTID) 1 g tablet Take 2-3 g by mouth daily.     Cyanocobalamin (B-12) 1000 MCG SUBL Place 1,000 mcg under the tongue daily.     diazepam  (VALIUM ) 5 MG tablet TAKE 1 TABLET (5 MG TOTAL) BY MOUTH EVERY 12 (TWELVE) HOURS AS NEEDED (VERTIGPO). 10 tablet 0   Galcanezumab -gnlm (EMGALITY ) 120 MG/ML SOAJ Inject 120 mg into the skin  every 30 (thirty) days. 1 mL 1   losartan  (COZAAR ) 25 MG tablet Take 1 tablet (25 mg total) by mouth in the morning and at bedtime. 180 tablet 3   ondansetron  (ZOFRAN ) 4 MG tablet Take 4 mg by mouth every 8 (eight) hours as needed for nausea or vomiting.     Pediatric Multiple Vit-C-FA (ANIMAL CHEWABLE MULTIVITAMIN PO) Take 1 tablet by mouth daily.     potassium chloride  SA (KLOR-CON  M) 20 MEQ tablet Take 1 tablet (20 mEq total) by mouth daily. 90 tablet 1   SKYRIZI 360 MG/2.4ML SOCT Inject 360 mg into the skin every 8 (eight) weeks.     spironolactone  (ALDACTONE ) 25 MG tablet TAKE  1 TABLET (25 MG TOTAL) BY MOUTH DAILY. 90 tablet 1   triamcinolone ointment (KENALOG) 0.1 % Apply 1 Application topically 2 (two) times daily.     No facility-administered medications prior to visit.    PAST MEDICAL HISTORY: Past Medical History:  Diagnosis Date   AICD (automatic cardioverter/defibrillator) present 09/14/2015   STJ dual chamber ICD implanted by Dr Rodolfo Clan for aborted cardiac arrest   Allergy    Bradycardia    Cardiac arrest (HCC) 08/31/2014   Crohn disease (HCC)    Depression    History of blood transfusion 09/2012   "had allergic rxn to one of my Crohn's RX"    Migraine    "a few times/yr" (06/08/2015)   Pancytopenia (HCC) 11/23/2012   Status post radiofrequency ablation for arrhythmia, atrial tach 06/08/15 06/08/2015   SVT (supraventricular tachycardia) (HCC) 12/08/2014   likely an atrial tachycardia with CL 250 msec    PAST SURGICAL HISTORY: Past Surgical History:  Procedure Laterality Date   : LAPAROSCOPY, SURGICAL; COLECTOMY, PARTIAL, WITH REMOVAL OF TERMINAL ILEUM WITH ILEOCOLOSTOMY 02/06/2022  02/06/2022   ABLATION     ELECTROPHYSIOLOGIC STUDY N/A 06/08/2015   Procedure: SVT Ablation;  Surgeon: Jolly Needle, MD;  Location: [facility] INVASIVE CV LAB;  Service: Cardiovascular;  Laterality: N/A;   ELECTROPHYSIOLOGY STUDY N/A 09/11/2014   Procedure: ELECTROPHYSIOLOGY STUDY;  Surgeon: Verona Goodwill, MD;  Location: [facility] CATH LAB;  Service: Cardiovascular;  Laterality: N/A;   ICD GENERATOR CHANGEOUT N/A 03/25/2024   Procedure: ICD GENERATOR CHANGEOUT;  Surgeon: Ardeen Kohler, MD;  Location: [facility] INVASIVE CV LAB;  Service: Cardiovascular;  Laterality: N/A;   IMPLANTABLE CARDIOVERTER DEFIBRILLATOR IMPLANT N/A 09/13/2014   Procedure: IMPLANTABLE CARDIOVERTER DEFIBRILLATOR IMPLANT;  Surgeon: Verona Goodwill, MD;  Location: [facility] CATH LAB;  Service: Cardiovascular;  Laterality: N/A;; STJ dual chamber ICD implanted by Dr Rodolfo Clan for aborted cardiac arrest    FAMILY HISTORY: Family History  Problem Relation Age of Onset   Heart attack Mother    Migraines Mother    Heart disease Mother    Heart attack Maternal Grandmother    Heart disease Maternal Grandmother    Diabetes Maternal Grandfather    Asthma Paternal Grandmother    Diabetes Paternal Grandfather     SOCIAL HISTORY: Social History   Socioeconomic History   Marital status: Single    Spouse name: Not on file   Number of children: Not on file   Years of education: Not on file   Highest education level: Not on file  Occupational History   Not on file  Tobacco Use   Smoking status: Never   Smokeless tobacco: Never  Vaping Use   Vaping status: Never Used  Substance and Sexual Activity   Alcohol use: Yes    Comment: occasional/social   Drug use: No   Sexual activity: Never  Other Topics Concern   Not on file  Social History Narrative   Caffeine: sodas 2/day, some tea    Social Drivers of Corporate investment banker Strain: Not on file  Food Insecurity: Not on file  Transportation Needs: Not on file  Physical Activity: Not on file  Stress: Not on file  Social Connections: Not on file  Intimate Partner Violence: Not on file      PHYSICAL EXAM  Vitals:   03/30/24 0842  BP: (!) 152/80  Pulse: 75  Weight: 213 lb (96.6 kg)  Height: 5\' 5"  (1.651 m)    Body mass index is 35.45 kg/m.  Generalized: Well  developed, in no acute distress   Neurological examination  Mentation: Alert oriented to time, place, history taking. Follows all commands speech and language fluent Cranial nerve II-XII: Pupils were equal round reactive to light. Extraocular movements were full, visual field were full on confrontational test.  Horizontal End beat nystagmus noted. Head turning and shoulder shrug  were normal and symmetric. Motor: The motor testing reveals 5 over 5 strength of all 4 extremities. Good symmetric motor tone is noted throughout.  Sensory: Sensory testing is intact to soft touch on all 4 extremities. No evidence of extinction is noted.  Coordination: Cerebellar testing reveals good finger-nose-finger and heel-to-shin bilaterally.  Gait and station: Gait is normal.    DIAGNOSTIC DATA (LABS, IMAGING, TESTING) - I reviewed patient records, labs, notes, testing and imaging myself where available.  Lab Results  Component Value Date   WBC 12.5 (H) 03/16/2024   HGB 13.4 03/16/2024   HCT 39.9 03/16/2024   MCV 89 03/16/2024   PLT 271 03/16/2024      Component Value Date/Time   NA 137 03/16/2024 0926   NA 140 02/24/2013 1029   K 3.9 03/16/2024 0926   K 3.4 (L) 02/24/2013 1029   CL 98 03/16/2024 0926   CL 102 02/24/2013 1029   CO2 20 03/16/2024 0926   CO2 21 (L) 02/24/2013 1029   GLUCOSE 90 03/16/2024 0926   GLUCOSE 93 10/30/2022 1611   GLUCOSE 81 02/24/2013 1029   BUN 11 03/16/2024 0926   BUN 6.9 (L) 02/24/2013 1029   CREATININE 0.95 03/16/2024 0926   CREATININE 0.89 08/26/2016 1615   CREATININE 0.8 02/24/2013 1029   CALCIUM  9.1 03/16/2024 0926   CALCIUM  9.9 02/24/2013 1029   PROT 7.5 10/30/2022 1611   PROT 6.6 09/22/2018 1532   PROT 8.7 (H) 02/24/2013 1029   ALBUMIN  3.6 10/30/2022 1611   ALBUMIN  3.4 (L) 09/22/2018 1532   ALBUMIN  3.7 02/24/2013 1029   AST 16 10/30/2022 1611   AST 13 02/24/2013 1029   ALT 11 10/30/2022 1611   ALT 9 02/24/2013 1029   ALKPHOS 53 10/30/2022 1611    ALKPHOS 57 02/24/2013 1029   BILITOT 0.4 10/30/2022 1611   BILITOT <0.2 09/22/2018 1532   BILITOT 0.39 02/24/2013 1029   GFRNONAA >60 10/30/2022 1611   GFRAA 86 09/22/2018 1532    Lab Results  Component Value Date   TSH 3.772 08/21/2016      ASSESSMENT AND PLAN 38 y.o. year old female  has a past medical history of AICD (automatic cardioverter/defibrillator) present (09/14/2015), Allergy, Bradycardia, Cardiac arrest (HCC) (08/31/2014), Crohn disease (HCC), Depression, History of blood transfusion (09/2012), Migraine, Pancytopenia (HCC) (11/23/2012), Status post radiofrequency ablation for arrhythmia, atrial tach 06/08/15 (06/08/2015), and SVT (supraventricular tachycardia) (HCC) (12/08/2014). here with:  Migraine headaches  Continue Emgality  monthly injection She is followed by cardiology with no new cardiac history  Vertigo  Continue exercises learned at vestibular rehab Continue Valium  5mg  every12 hour PRN for severe episodes  She will follow-up in 1 year or sooner if needed     Clem Currier, MSN, NP-C 03/29/2024, 11:39 AM [facility] [address] [place], [place] [address] [phone]

## 2024-03-30 ENCOUNTER — Ambulatory Visit: Payer: 59 | Admitting: Adult Health

## 2024-03-30 ENCOUNTER — Encounter: Payer: Self-pay | Admitting: Adult Health

## 2024-03-30 VITALS — BP 152/80 | HR 75 | Ht 65.0 in | Wt 213.0 lb

## 2024-03-30 DIAGNOSIS — R42 Dizziness and giddiness: Secondary | ICD-10-CM

## 2024-03-30 DIAGNOSIS — G43009 Migraine without aura, not intractable, without status migrainosus: Secondary | ICD-10-CM | POA: Diagnosis not present

## 2024-03-30 MED ORDER — EMGALITY 120 MG/ML ~~LOC~~ SOAJ: 120.0000 mg | SUBCUTANEOUS | 1 refills | Status: DC

## 2024-03-30 MED ORDER — DIAZEPAM 5 MG PO TABS: 5.0000 mg | ORAL_TABLET | Freq: Two times a day (BID) | ORAL | 0 refills | Status: AC | PRN

## 2024-03-30 NOTE — Patient Instructions (Signed)
 Your Plan:  Continue emgality  monthly injections Use Valium  5 mg only for severe episodes of vertigo      Thank you for coming to see us  at [facility]. I hope we have been able to provide you high quality care today.  You may receive a patient satisfaction survey over the next few weeks. We would appreciate your feedback and comments so that we may continue to improve ourselves and the health of our patients.

## 2024-03-31 ENCOUNTER — Ambulatory Visit: Payer: 59

## 2024-04-06 NOTE — Patient Instructions (Signed)
   After Your ICD (Implantable Cardiac Defibrillator)    Monitor your defibrillator site for redness, swelling, and drainage. Call the device clinic at [phone] if you experience these symptoms or fever/chills.  Your incision was closed with Steri-strips or staples:  You may shower 7 days after your procedure and wash your incision with soap and water. Avoid lotions, ointments, or perfumes over your incision until it is well-healed.  You may use a hot tub or a pool after your wound check appointment if the incision is completely closed.   There are no other restrictions in arm movement after your wound check appointment.  Your ICD is designed to protect you from life threatening heart rhythms. Because of this, you may receive a shock.   1 shock with no symptoms:  Call the office during business hours. 1 shock with symptoms (chest pain, chest pressure, dizziness, lightheadedness, shortness of breath, overall feeling unwell):  Call 911. If you experience 2 or more shocks in 24 hours:  Call 911. If you receive a shock, you should not drive.  [place] DMV - no driving for 6 months if you receive appropriate therapy from your ICD.   ICD Alerts:  Some alerts are vibratory and others beep. These are NOT emergencies. Please call our office to let us know. If this occurs at night or on weekends, it can wait until the next business day. Send a remote transmission.  If your device is capable of reading fluid status (for heart failure), you will be offered monthly monitoring to review this with you.   Remote monitoring is used to monitor your ICD from home. This monitoring is scheduled every 91 days by our office. It allows [place] to keep an eye on the functioning of your device to ensure it is working properly. You will routinely see your Electrophysiologist annually (more often if necessary).

## 2024-04-07 ENCOUNTER — Ambulatory Visit: Attending: Internal Medicine

## 2024-04-07 DIAGNOSIS — I469 Cardiac arrest, cause unspecified: Secondary | ICD-10-CM

## 2024-04-07 DIAGNOSIS — I428 Other cardiomyopathies: Secondary | ICD-10-CM

## 2024-04-07 NOTE — Progress Notes (Signed)
 Normal DUAL chamber ICD wound check. Wound well healed. Presenting rhythm: AS/VS 91. Routine testing performed. Thresholds, sensing, and impedances consistent with implant measurements and outputs appropriate for chronic leads. No treated arrhythmias. Gen change only, no arm restrictions.  Reviewed shock plan.  Pt enrolled in remote follow-up.  PROGRAMMING CHANGE:  *Patient complains of shortness of breath with exertion since gen change that wasn't present prior.  Programmed settings reviewed and compared with prior programming adjusted by Dr. Rodolfo Clan pre-gen change.  The following settings adjusted to match prior programming:  1.  Rate Response turned on 2.  Sensor adjusted from Passive to On.   Patient aware that if SOB does not improve, she is to call the device clinic as for further considerations by provider as would not be device related at that point.

## 2024-04-19 NOTE — Addendum Note (Signed)
 Addended by: Edra Govern D on: 04/19/2024 03:12 PM   Modules accepted: Orders, Level of Service

## 2024-04-19 NOTE — Progress Notes (Signed)
 Remote ICD transmission.

## 2024-05-02 ENCOUNTER — Ambulatory Visit: Payer: 59

## 2024-05-02 ENCOUNTER — Ambulatory Visit (INDEPENDENT_AMBULATORY_CARE_PROVIDER_SITE_OTHER)

## 2024-05-02 DIAGNOSIS — I428 Other cardiomyopathies: Secondary | ICD-10-CM

## 2024-05-02 DIAGNOSIS — I469 Cardiac arrest, cause unspecified: Secondary | ICD-10-CM

## 2024-05-03 LAB — CUP PACEART REMOTE DEVICE CHECK
Battery Remaining Longevity: 105 mo
Battery Remaining Percentage: 92 %
Battery Voltage: 3.05 V
Brady Statistic AP VP Percent: 1 %
Brady Statistic AP VS Percent: 24 %
Brady Statistic AS VP Percent: 1 %
Brady Statistic AS VS Percent: 76 %
Brady Statistic RA Percent Paced: 22 %
Brady Statistic RV Percent Paced: 1 %
Date Time Interrogation Session: 20250621020350
HighPow Impedance: 62 Ohm
Implantable Lead Connection Status: 753985
Implantable Lead Connection Status: 753985
Implantable Lead Implant Date: 20151104
Implantable Lead Implant Date: 20151104
Implantable Lead Location: 753859
Implantable Lead Location: 753860
Implantable Lead Model: 5076
Implantable Pulse Generator Implant Date: 20250516
Lead Channel Impedance Value: 330 Ohm
Lead Channel Impedance Value: 390 Ohm
Lead Channel Pacing Threshold Amplitude: 0.5 V
Lead Channel Pacing Threshold Amplitude: 1 V
Lead Channel Pacing Threshold Pulse Width: 0.4 ms
Lead Channel Pacing Threshold Pulse Width: 0.4 ms
Lead Channel Sensing Intrinsic Amplitude: 5 mV
Lead Channel Sensing Intrinsic Amplitude: 7.7 mV
Lead Channel Setting Pacing Amplitude: 2 V
Lead Channel Setting Pacing Amplitude: 2.5 V
Lead Channel Setting Pacing Pulse Width: 0.4 ms
Lead Channel Setting Sensing Sensitivity: 0.5 mV
Pulse Gen Serial Number: 211024966

## 2024-05-09 DIAGNOSIS — I469 Cardiac arrest, cause unspecified: Secondary | ICD-10-CM

## 2024-05-11 ENCOUNTER — Ambulatory Visit: Payer: Self-pay | Admitting: Cardiovascular Disease

## 2024-05-26 ENCOUNTER — Ambulatory Visit: Payer: 59

## 2024-06-02 ENCOUNTER — Ambulatory Visit: Payer: 59

## 2024-06-06 NOTE — Addendum Note (Signed)
 Addended by: TAWNI DRILLING D on: 06/06/2024 03:22 PM   Modules accepted: Orders

## 2024-06-06 NOTE — Progress Notes (Signed)
 Remote ICD transmission.

## 2024-06-22 ENCOUNTER — Other Ambulatory Visit (HOSPITAL_COMMUNITY): Payer: Self-pay | Admitting: Internal Medicine

## 2024-06-24 ENCOUNTER — Encounter

## 2024-06-29 ENCOUNTER — Ambulatory Visit: Admitting: Pulmonary Disease

## 2024-07-07 ENCOUNTER — Encounter

## 2024-07-07 ENCOUNTER — Encounter: Admitting: Cardiology

## 2024-07-10 ENCOUNTER — Encounter

## 2024-07-12 ENCOUNTER — Encounter (HOSPITAL_BASED_OUTPATIENT_CLINIC_OR_DEPARTMENT_OTHER): Payer: Self-pay

## 2024-07-12 NOTE — Progress Notes (Unsigned)
  Electrophysiology Office Note:   ID:  Julie Saunders, DOB [birth year], MRN [id number]  Primary Cardiologist: None Electrophysiologist: Fonda Kitty, MD  {Click to update primary MD,subspecialty MD or APP then REFRESH:1}    History of Present Illness:   Julie Saunders is a 38 y.o. female with h/o cardiac arrest s/p ICD 2015 (no specific cause identified), recurrent palpitations s/p EPS with RFCA, ?questionable familial hx of hypokalemia / mother deceased of heart attack at age 65, Chron's Disease s/p partial small bowel resection with primary anastomosis  seen today for {VISITTYPE:28148}  Review of systems complete and found to be negative unless listed in HPI.   EP Information / Studies Reviewed:    EKG is ordered today. Personal review as below.       ICD Interrogation-  reviewed in detail today,  See PACEART report.  Arrhythmia/Device History Abbott Dual Chamber ICD implanted 09/13/2014 for VF Arrest / NICM gen change 03/25/2024   Physical Exam:   VS:  There were no vitals taken for this visit.   Wt Readings from Last 3 Encounters:  03/30/24 213 lb (96.6 kg)  03/25/24 212 lb (96.2 kg)  03/07/24 214 lb 9.6 oz (97.3 kg)     GEN: No acute distress *** NECK: No JVD; No carotid bruits CARDIAC: {EPRHYTHM:28826}, no murmurs, rubs, gallops RESPIRATORY:  Clear to auscultation without rales, wheezing or rhonchi  ABDOMEN: Soft, non-tender, non-distended EXTREMITIES:  {EDEMA LEVEL:28147::No} edema; No deformity   ASSESSMENT AND PLAN:    Chronic systolic CHF  s/p Abbott dual chamber ICD  Cardiac Arrest 2015 / VT euvolemic today Stable on an appropriate medical regimen Normal ICD function See Pace Art report No changes today  SVT s/p RFCA None on device  HTN Stable on current regimen    Disposition:   Follow up with {EPPROVIDERS:28135::EP Team} {EPFOLLOW UP:28173}   Signed, Ozell Prentice Passey, PA-C

## 2024-07-14 ENCOUNTER — Ambulatory Visit: Payer: Self-pay | Admitting: Cardiovascular Disease

## 2024-07-14 ENCOUNTER — Encounter: Payer: Self-pay | Admitting: Student

## 2024-07-14 ENCOUNTER — Ambulatory Visit: Attending: Student | Admitting: Student

## 2024-07-14 VITALS — BP 120/72 | HR 65 | Ht 65.0 in | Wt 215.0 lb

## 2024-07-14 DIAGNOSIS — I5022 Chronic systolic (congestive) heart failure: Secondary | ICD-10-CM

## 2024-07-14 DIAGNOSIS — I471 Supraventricular tachycardia, unspecified: Secondary | ICD-10-CM | POA: Diagnosis not present

## 2024-07-14 DIAGNOSIS — I10 Essential (primary) hypertension: Secondary | ICD-10-CM

## 2024-07-14 LAB — CUP PACEART INCLINIC DEVICE CHECK
Battery Remaining Longevity: 110 mo
Brady Statistic RA Percent Paced: 22 %
Brady Statistic RV Percent Paced: 0.04 %
Date Time Interrogation Session: 20250904131253
HighPow Impedance: 64.125
Implantable Lead Connection Status: 753985
Implantable Lead Connection Status: 753985
Implantable Lead Implant Date: 20151104
Implantable Lead Implant Date: 20151104
Implantable Lead Location: 753859
Implantable Lead Location: 753860
Implantable Lead Model: 5076
Implantable Pulse Generator Implant Date: 20250516
Lead Channel Impedance Value: 325 Ohm
Lead Channel Impedance Value: 500 Ohm
Lead Channel Pacing Threshold Amplitude: 0.5 V
Lead Channel Pacing Threshold Amplitude: 0.5 V
Lead Channel Pacing Threshold Amplitude: 1.25 V
Lead Channel Pacing Threshold Amplitude: 1.25 V
Lead Channel Pacing Threshold Pulse Width: 0.4 ms
Lead Channel Pacing Threshold Pulse Width: 0.4 ms
Lead Channel Pacing Threshold Pulse Width: 0.4 ms
Lead Channel Pacing Threshold Pulse Width: 0.4 ms
Lead Channel Sensing Intrinsic Amplitude: 10.8 mV
Lead Channel Sensing Intrinsic Amplitude: 4.8 mV
Lead Channel Setting Pacing Amplitude: 2 V
Lead Channel Setting Pacing Amplitude: 2.5 V
Lead Channel Setting Pacing Pulse Width: 0.4 ms
Lead Channel Setting Sensing Sensitivity: 0.5 mV
Pulse Gen Serial Number: 211024966

## 2024-07-14 MED ORDER — POTASSIUM CHLORIDE CRYS ER 20 MEQ PO TBCR: 20.0000 meq | EXTENDED_RELEASE_TABLET | Freq: Every day | ORAL | 3 refills | Status: AC

## 2024-07-14 NOTE — Patient Instructions (Signed)
 Medication Instructions:  Your physician recommends that you continue on your current medications as directed. Please refer to the Current Medication list given to you today.  *If you need a refill on your cardiac medications before your next appointment, please call your pharmacy*  Lab Work: None ordered If you have labs (blood work) drawn today and your tests are completely normal, you will receive your results only by: MyChart Message (if you have MyChart) OR A paper copy in the mail If you have any lab test that is abnormal or we need to change your treatment, we will call you to review the results.  Follow-Up: At [facility], you and your health needs are our priority.  As part of our continuing mission to provide you with exceptional heart care, our providers are all part of one team.  This team includes your primary Cardiologist (physician) and Advanced Practice Providers or APPs (Physician Assistants and Nurse Practitioners) who all work together to provide you with the care you need, when you need it.  Your next appointment:   1 year(s)  Provider:   You may see Ardeen Kohler, MD or one of the following Advanced Practice Providers on your designated Care Team:   Mertha Abrahams, PA-C Emmert Andy Tillery, PA-C Suzann Riddle, NP Creighton Doffing, NP

## 2024-07-20 ENCOUNTER — Encounter

## 2024-08-01 ENCOUNTER — Ambulatory Visit

## 2024-08-01 ENCOUNTER — Telehealth: Payer: Self-pay

## 2024-08-01 DIAGNOSIS — I5022 Chronic systolic (congestive) heart failure: Secondary | ICD-10-CM

## 2024-08-01 NOTE — Telephone Encounter (Signed)
 Patient needs call back about not being able to send remote transmission that was scheduled 08/01/24.

## 2024-08-02 ENCOUNTER — Encounter

## 2024-08-02 NOTE — Telephone Encounter (Signed)
 Pt called in stating that he doesn't have internet at the moment and she will send it sometime this week

## 2024-08-04 LAB — CUP PACEART REMOTE DEVICE CHECK
Battery Remaining Longevity: 103 mo
Battery Remaining Percentage: 90 %
Battery Voltage: 3.02 V
Brady Statistic AP VP Percent: 1 %
Brady Statistic AP VS Percent: 19 %
Brady Statistic AS VP Percent: 1 %
Brady Statistic AS VS Percent: 80 %
Brady Statistic RA Percent Paced: 17 %
Brady Statistic RV Percent Paced: 1 %
Date Time Interrogation Session: 20250924180559
HighPow Impedance: 66 Ohm
Implantable Lead Connection Status: 753985
Implantable Lead Connection Status: 753985
Implantable Lead Implant Date: 20151104
Implantable Lead Implant Date: 20151104
Implantable Lead Location: 753859
Implantable Lead Location: 753860
Implantable Lead Model: 5076
Implantable Pulse Generator Implant Date: 20250516
Lead Channel Impedance Value: 300 Ohm
Lead Channel Impedance Value: 430 Ohm
Lead Channel Pacing Threshold Amplitude: 0.5 V
Lead Channel Pacing Threshold Amplitude: 1.25 V
Lead Channel Pacing Threshold Pulse Width: 0.4 ms
Lead Channel Pacing Threshold Pulse Width: 0.4 ms
Lead Channel Sensing Intrinsic Amplitude: 3.3 mV
Lead Channel Sensing Intrinsic Amplitude: 6.8 mV
Lead Channel Setting Pacing Amplitude: 2 V
Lead Channel Setting Pacing Amplitude: 2.5 V
Lead Channel Setting Pacing Pulse Width: 0.4 ms
Lead Channel Setting Sensing Sensitivity: 0.5 mV
Pulse Gen Serial Number: 211024966

## 2024-08-04 NOTE — Progress Notes (Signed)
Remote ICD Transmission.

## 2024-08-08 DIAGNOSIS — I5022 Chronic systolic (congestive) heart failure: Secondary | ICD-10-CM | POA: Diagnosis not present

## 2024-08-15 ENCOUNTER — Encounter

## 2024-08-16 ENCOUNTER — Ambulatory Visit: Payer: Self-pay | Admitting: Cardiovascular Disease

## 2024-08-22 ENCOUNTER — Encounter: Payer: Self-pay | Admitting: Adult Health

## 2024-08-22 MED ORDER — EMGALITY 120 MG/ML ~~LOC~~ SOAJ: 120.0000 mg | SUBCUTANEOUS | 6 refills | Status: AC

## 2024-08-25 ENCOUNTER — Ambulatory Visit: Payer: 59

## 2024-08-29 ENCOUNTER — Encounter

## 2024-09-12 ENCOUNTER — Encounter

## 2024-09-26 ENCOUNTER — Encounter

## 2024-10-09 ENCOUNTER — Encounter

## 2024-10-26 ENCOUNTER — Telehealth: Payer: Self-pay | Admitting: Adult Health

## 2024-10-26 NOTE — Telephone Encounter (Signed)
 Pt has r/s due to a schedule conflict

## 2024-10-31 ENCOUNTER — Ambulatory Visit

## 2024-10-31 DIAGNOSIS — I5022 Chronic systolic (congestive) heart failure: Secondary | ICD-10-CM

## 2024-11-01 LAB — CUP PACEART REMOTE DEVICE CHECK
Battery Remaining Longevity: 100 mo
Battery Remaining Percentage: 88 %
Battery Voltage: 3.02 V
Brady Statistic AP VP Percent: 1 %
Brady Statistic AP VS Percent: 20 %
Brady Statistic AS VP Percent: 1 %
Brady Statistic AS VS Percent: 80 %
Brady Statistic RA Percent Paced: 18 %
Brady Statistic RV Percent Paced: 1 %
Date Time Interrogation Session: 20251222020116
HighPow Impedance: 71 Ohm
Implantable Lead Connection Status: 753985
Implantable Lead Connection Status: 753985
Implantable Lead Implant Date: 20151104
Implantable Lead Implant Date: 20151104
Implantable Lead Location: 753859
Implantable Lead Location: 753860
Implantable Lead Model: 5076
Implantable Pulse Generator Implant Date: 20250516
Lead Channel Impedance Value: 330 Ohm
Lead Channel Impedance Value: 440 Ohm
Lead Channel Pacing Threshold Amplitude: 0.5 V
Lead Channel Pacing Threshold Amplitude: 1.25 V
Lead Channel Pacing Threshold Pulse Width: 0.4 ms
Lead Channel Pacing Threshold Pulse Width: 0.4 ms
Lead Channel Sensing Intrinsic Amplitude: 3.7 mV
Lead Channel Sensing Intrinsic Amplitude: 8.6 mV
Lead Channel Setting Pacing Amplitude: 2 V
Lead Channel Setting Pacing Amplitude: 2.5 V
Lead Channel Setting Pacing Pulse Width: 0.4 ms
Lead Channel Setting Sensing Sensitivity: 0.5 mV
Pulse Gen Serial Number: 211024966

## 2024-11-02 NOTE — Progress Notes (Signed)
 Remote ICD Transmission

## 2024-11-07 DIAGNOSIS — I5022 Chronic systolic (congestive) heart failure: Secondary | ICD-10-CM | POA: Diagnosis not present

## 2024-11-09 ENCOUNTER — Ambulatory Visit: Payer: Self-pay | Admitting: Cardiovascular Disease

## 2024-11-14 ENCOUNTER — Ambulatory Visit (HOSPITAL_COMMUNITY)
Admission: RE | Admit: 2024-11-14 | Discharge: 2024-11-14 | Disposition: A | Discharge status: Home / Self Care | Source: Ambulatory Visit | Attending: Internal Medicine | Admitting: Internal Medicine

## 2024-11-14 ENCOUNTER — Encounter (HOSPITAL_COMMUNITY): Payer: Self-pay | Admitting: Internal Medicine

## 2024-11-14 ENCOUNTER — Other Ambulatory Visit (HOSPITAL_COMMUNITY): Payer: Self-pay | Admitting: Internal Medicine

## 2024-11-14 VITALS — BP 128/82 | HR 71 | Wt 215.4 lb

## 2024-11-14 DIAGNOSIS — K5090 Crohn's disease, unspecified, without complications: Secondary | ICD-10-CM | POA: Diagnosis not present

## 2024-11-14 DIAGNOSIS — Z79899 Other long term (current) drug therapy: Secondary | ICD-10-CM | POA: Insufficient documentation

## 2024-11-14 DIAGNOSIS — I110 Hypertensive heart disease with heart failure: Secondary | ICD-10-CM | POA: Diagnosis not present

## 2024-11-14 DIAGNOSIS — I428 Other cardiomyopathies: Secondary | ICD-10-CM | POA: Insufficient documentation

## 2024-11-14 DIAGNOSIS — Z95810 Presence of automatic (implantable) cardiac defibrillator: Secondary | ICD-10-CM | POA: Diagnosis not present

## 2024-11-14 DIAGNOSIS — I10 Essential (primary) hypertension: Secondary | ICD-10-CM | POA: Diagnosis not present

## 2024-11-14 DIAGNOSIS — I5022 Chronic systolic (congestive) heart failure: Secondary | ICD-10-CM | POA: Diagnosis not present

## 2024-11-14 MED ORDER — LOSARTAN POTASSIUM 25 MG PO TABS: ORAL_TABLET | ORAL | 3 refills | Status: AC

## 2024-11-14 NOTE — Patient Instructions (Signed)
 Medication Changes:  INCREASE Losartan  to 25 mg in AM and 50 mg in PM   Testing/Procedures:  Your physician has requested that you have an echocardiogram. Echocardiography is a painless test that uses sound waves to create images of your heart. It provides your doctor with information about the size and shape of your heart and how well your hearts chambers and valves are working. This procedure takes approximately one hour. There are no restrictions for this procedure. Please do NOT wear cologne, perfume, aftershave, or lotions (deodorant is allowed). Please arrive 15 minutes prior to your appointment time.  Please note: We ask at that you not bring children with you during ultrasound (echo/ vascular) testing. Due to room size and safety concerns, children are not allowed in the ultrasound rooms during exams. Our front office staff cannot provide observation of children in our lobby area while testing is being conducted. An adult accompanying a patient to their appointment will only be allowed in the ultrasound room at the discretion of the ultrasound technician under special circumstances. We apologize for any inconvenience.   Special Instructions // Education:  Do the following things EVERYDAY: Weigh yourself in the morning before breakfast. Write it down and keep it in a log. Take your medicines as prescribed Eat low salt foods--Limit salt (sodium) to 2000 mg per day.  Stay as active as you can everyday Limit all fluids for the day to less than 2 liters   Follow-Up in: 9 months (October 2026), ** PLEASE CALL OUR OFFICE IN AUGUST TO SCHEDULE THIS APPOINTMENT   At the [facility], you and your health needs are our priority. We have a designated team specialized in the treatment of Heart Failure. This Care Team includes your primary Heart Failure Specialized Cardiologist (physician), Advanced Practice Providers (APPs- Physician Assistants and Nurse Practitioners), and  Pharmacist who all work together to provide you with the care you need, when you need it.   You may see any of the following providers on your designated Care Team at your next follow up:  Dr. Toribio Fuel Dr. Ezra Shuck Dr. Odis Brownie Greig Mosses, NP Caffie Shed, [place] [facility] [place], [place] Beckey Coe, NP Jordan Lee, NP Tinnie Redman, PharmD   Please be sure to bring in all your medications bottles to every appointment.   Need to Contact Us :  If you have any questions or concerns before your next appointment please send us  a message through [place] or call our office at [phone].    TO LEAVE A MESSAGE FOR THE NURSE SELECT OPTION 2, PLEASE LEAVE A MESSAGE INCLUDING: YOUR NAME DATE OF BIRTH CALL BACK NUMBER REASON FOR CALL**this is important as we prioritize the call backs  YOU WILL RECEIVE A CALL BACK THE SAME DAY AS LONG AS YOU CALL BEFORE 4:00 PM

## 2024-11-14 NOTE — Addendum Note (Signed)
 Encounter addended by: Buell Powell HERO, RN on: 11/14/2024 3:58 PM  Actions taken: Diagnosis association updated, Order list changed, Clinical Note Signed

## 2024-11-14 NOTE — Progress Notes (Signed)
 "  [facility]  PCP: Leonel Cole, MD EP: Fernande Standing HF Cardiologist: Dr. Cherrie  HPI: 39 y.o. female with PMH of Crohn's disease, migraine headaches, Chronic Systolic CHF, SVT s/p ablation 06/08/15, Vtach, presumed long QT, s/p ICD implant. Sees Neurology for vertigo and headaches.  Initially she presented to [facility] ER on 08/31/14 after collapse at home in the setting of prolonged diarrhea 2/2 crohn's disease.  Arrived with K of 2.6. She was unresponsive for approx. 10-15 minutes before fire department arrived and initiated CPR for 15 minutes until EMS arrived and initiated ACLS with CPR for 15 more minutes prior to ROSC.  Pt was defibrillated 4x for vfib, and received 4 rounds of epinephrine , 150 mg amiodarone , narcan , D50, and was intubated.  10/24- rewarmed, followed commands.  She had an ECHO 08/31/14 with EF 25-30% which was thought to be secondary to her arrest, given that her repeat on 09/08/14 EF improved to 60%. Post cardiac arrest she developed sinus bradycardia and ATN.  Because of her acute kidney injury secondary to the arrest she could not undergo cardiac catheterization.  09/11/14 had epinephrine  infusion observation with rare ectopic beats and stable QT at .  Also had flecainide  brugada challenge which was negative.  Stress Myoview on 09/12/14 showed no evidence of prior infarct or ischemia, mild diffuse global hypokinesis, EF 52%.  She underwent dual chamber St Jude  ICD on 09/11/14.    12/08/2014 defibrillator discharged and she went to the ED interrogation revealed episode of polymorphic Vtach followed by SVT/atrial tach initial rate 240. EP consulted and restarted her propanolol (was stopped at outpatient visit due to dizziness) and to follow up with Dr. Fernande.  Dr. Fernande increased propanolol to 120 mg and performed SVT ablation 06/08/15.    08/2016 came to her outpatient visit with Dr. Fernande and was severely hypotensive found to have lost 24 additional pounds  related to Crohn's. No further SVT or Vtach noted.  Propanolol d/ced.  Admitted for failure to thrive 10/2016 at [facility] up to 50lb weight loss at that time started on steroids, TPN.    ECHO studies stable normal EF until 11/06/2020 where her EF dropped down to 35-40%.    We saw her for the first time in 5/22.  EF 45-50% and than symptoms related more to deconditioning than HF. Referred to our exercise physiologist who was coaching her. Participated in PT earlier this year. Discharged d/t lack of progress.   Echo 11/22 EF 55%  Had bowel resection in 3/23 at [facility].  Echo 12/23: EF 45%  Echo 11/24 EF 45-50% RV normal Personally reviewed  Today she returns for HF follow up. Doing fairly well. Not very active. Crohn's is under good control. Gets dizzy at times. Follows BP closely SBP 120-156. GI doc considering TNF-alpha agent   ICD interrogation:Abbott device No VT/AF. Fluid ok. Personally reviewed     FH:  Some Asthma in family, Grandmother, Uncle  Mother with MVP, MI at age 62  Past Medical History:  Diagnosis Date   AICD (automatic cardioverter/defibrillator) present 09/14/2015   STJ dual chamber ICD implanted by Dr Fernande for aborted cardiac arrest   Allergy    Bradycardia    Cardiac arrest (HCC) 08/31/2014   Crohn disease (HCC)    Depression    History of blood transfusion 09/2012   had allergic rxn to one of my Crohn's RX    Migraine    a few times/yr (06/08/2015)   Pancytopenia (HCC) 11/23/2012  Status post radiofrequency ablation for arrhythmia, atrial tach 06/08/15 06/08/2015   SVT (supraventricular tachycardia) 12/08/2014   likely an atrial tachycardia with CL 250 msec    Current Outpatient Medications  Medication Sig Dispense Refill   acetaminophen  (TYLENOL ) 325 MG tablet Take 650 mg by mouth every 6 (six) hours as needed for headache.     cetirizine (ZYRTEC) 10 MG tablet Take 10 mg by mouth daily.     Cholecalciferol (VITAMIN D3) 50 MCG (2000 UT) CHEW Chew 2,000  Units by mouth daily.     Colestipol HCl (COLESTID PO) Take 2 g by mouth daily. 2-4 g daily     Cyanocobalamin (B-12) 1000 MCG SUBL Place 1,000 mcg under the tongue daily.     diazepam  (VALIUM ) 5 MG tablet Take 1 tablet (5 mg total) by mouth every 12 (twelve) hours as needed (vertigpo). 10 tablet 0   Galcanezumab -gnlm (EMGALITY ) 120 MG/ML SOAJ Inject 120 mg into the skin every 30 (thirty) days. 1 mL 6   losartan  (COZAAR ) 25 MG tablet Take 1 tablet (25 mg total) by mouth in the morning and at bedtime. 180 tablet 3   ondansetron  (ZOFRAN ) 4 MG tablet Take 4 mg by mouth every 8 (eight) hours as needed for nausea or vomiting.     Pediatric Multiple Vit-C-FA (ANIMAL CHEWABLE MULTIVITAMIN PO) Take 1 tablet by mouth daily.     potassium chloride  SA (KLOR-CON  M) 20 MEQ tablet Take 1 tablet (20 mEq total) by mouth daily. 90 tablet 3   Risankizumab-rzaa (SKYRIZI) 360 MG/2.4ML SOCT Inject 360 mg into the skin every 28 (twenty-eight) days.     spironolactone  (ALDACTONE ) 25 MG tablet TAKE 1 TABLET (25 MG TOTAL) BY MOUTH DAILY. 90 tablet 1   triamcinolone ointment (KENALOG) 0.1 % Apply 1 Application topically as needed.     No current facility-administered medications for this encounter.   Allergies  Allergen Reactions   Sulfa Antibiotics Rash   Social History   Socioeconomic History   Marital status: Single    Spouse name: Not on file   Number of children: Not on file   Years of education: Not on file   Highest education level: Not on file  Occupational History   Not on file  Tobacco Use   Smoking status: Never   Smokeless tobacco: Never  Vaping Use   Vaping status: Never Used  Substance and Sexual Activity   Alcohol use: Yes    Comment: occasional/social   Drug use: No   Sexual activity: Never  Other Topics Concern   Not on file  Social History Narrative   Caffeine: sodas 2/day, some tea    Pt lives with family Pt doesn't work    Social Drivers of Health   Tobacco Use: Low Risk  (11/14/2024)   Patient History    Smoking Tobacco Use: Never    Smokeless Tobacco Use: Never    Passive Exposure: Not on file  Financial Resource Strain: Not on file  Food Insecurity: Not on file  Transportation Needs: Not on file  Physical Activity: Not on file  Stress: Not on file  Social Connections: Not on file  Intimate Partner Violence: Not on file  Depression (EYV7-0): Not on file  Alcohol Screen: Not on file  Housing: Not on file  Utilities: Not on file  Health Literacy: Not on file   Family History  Problem Relation Age of Onset   Heart attack Mother    Migraines Mother    Heart disease Mother  Heart attack Maternal Grandmother    Heart disease Maternal Grandmother    Diabetes Maternal Grandfather    Asthma Paternal Grandmother    Diabetes Paternal Grandfather    BP 128/82   Pulse 71   Wt 97.7 kg (215 lb 6.4 oz)   SpO2 96%   BMI 35.84 kg/m   Wt Readings from Last 3 Encounters:  11/14/24 97.7 kg (215 lb 6.4 oz)  07/14/24 97.5 kg (215 lb)  03/30/24 96.6 kg (213 lb)   Physical Exam General:  Sitting up. No resp difficulty HEENT: normal Neck: supple. no JVD.  Cor: Regular rate & rhythm. No rubs, gallops or murmurs. Lungs: clear Abdomen: soft, nontender, nondistended.Good bowel sounds. Extremities: no cyanosis, clubbing, rash, edema Neuro: alert & orientedx3, cranial nerves grossly intact. moves all 4 extremities w/o difficulty. Affect pleasant    ASSESSMENT & PLAN:  1. 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 - Echo (09/08/14): EF improved to 60%  - Could not undergo LHC due to ATN from arrest, she had dual chamber St. Jude ICD implanted 09/13/14 - Subsequent echo studies with normal EF through 2020 low normal at 50-55%  - Echo 11/06/2020 EF read as 35-40% - EF from 12/21 Likely ~45%  if not 45-50% with normal diastolic function - Echo (11/22): EF 55% - Echo (12/23): EF 45% - Echo 11/24 EF 45-50% RV normal Personally reviewed - Stable NYHA II Volume ok GDMT has been limited by dizziness. - Increase losartan  25 bid -> 25/50(failed Entresto  due to dizziness)  - Continue spiro 25 mg qhs. - Not able to tolerate coreg  and propanolol in the past d/t dizziness and orthostasis - Unable to tolerate Jardiance  due to dizziness. - Due for repeat echo  2. Crohn's Disease: - Now followed by [facility] - s/p bowel resection at [facility] in 3/23. - currently stable - will repeat echo if EF >= 40% would be OK with NF-alpha agent  3. H/o SVT: - felt to be atrial tach had an ablation 06/08/15 - no recurrence on ICD  4. HTN - BP elevated. Increase losartan  as above    Toribio Fuel, MD  11/14/2024 3:10 PM  "

## 2024-11-22 ENCOUNTER — Ambulatory Visit: Attending: Genetic Counselor | Admitting: Genetic Counselor

## 2024-11-22 DIAGNOSIS — I469 Cardiac arrest, cause unspecified: Secondary | ICD-10-CM

## 2024-11-22 NOTE — Progress Notes (Signed)
 Post test Genetic Consult  Julie Saunders is here today for her post test genetic consult of testing for genes implicated in sudden cardiac arrest.   This is a telemedicine visit. Patient identity was confirmed with two unique identifiers.  Julie Saunders reports no changes in her medical history since we last met. We reviewed her family history and she notes no changes.With Dr. Fernande being retired, she is now under the care of Dr. Cherrie and Dr. Kennyth.   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 I explained to her that mutations in this gene lead to Catecholaminergic Polymorphic Ventricular Tachycardia (CPVT) which is an inherited arrhythmia syndrome that induces ventricular tachycardia leading to ventricular fibrillation when triggered by exercise or emotion. Patients typically have structurally normal hearts and present with sudden cardiac arrest or death if they have a RYR2 gene mutation.  I explained to her that RYR2 gene encodes the Ryanodine receptor in cardiac cells that is responsible for calcium  release from an intracellular chamber. Gain of function mutations in RYR2 gene causes the receptor to leak calcium  channels that subsequently triggers ventricular arrhythmia when stimulated by exercise or emotion.  The RYR2 gene variant that Julie Saunders has is a novel variant that has not yet been reported in literature or in large mutation and control databases. The RYR2 Asp242 residue is highly conserved in primates and other placental mammals. This variant is located in the N-terminal domain (NTD) of RyR2 protein, an important functional domain that is critical for channel activation and expression Geofm et al., 2021).  Additionally, computational algorithms indicate a deleterious effect of this mutation on protein function (REVEL scores 0.91, 0.707).    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 Considering that her mother and maternal grandparents died suddenly, it is highly likely that she inherited this condition. However, there are currently no living relatives with tachycardia or other living maternal relatives.  Mutations in RYR2 gene are inherited in an autosomal dominant manner. Explained to her that if she plans to have children in the future, they are at a 50% risk of inheriting this variant. Usually, relatives are not tested for a variant of unknown significance. If the knowledgebase on this variant has improved over time and it is reclassified as a pathogenic variant, then her children can undergo testing for the familial RYR2 variant.   Also discussed that the results of this test do no change her management or treatment strategies. Understandably, this was a lot of information to process. I have asked her to reach back to me if she needs further clarifications or has additional questions.  Danford Pac, Ph.D, [facility] Clinical Molecular Geneticist

## 2024-11-28 ENCOUNTER — Encounter: Payer: Self-pay | Admitting: Internal Medicine

## 2024-12-02 ENCOUNTER — Ambulatory Visit (HOSPITAL_COMMUNITY)
Admission: RE | Admit: 2024-12-02 | Discharge: 2024-12-02 | Disposition: A | Source: Ambulatory Visit | Attending: Family Medicine | Admitting: Family Medicine

## 2024-12-02 DIAGNOSIS — I509 Heart failure, unspecified: Secondary | ICD-10-CM | POA: Diagnosis not present

## 2024-12-02 DIAGNOSIS — I5022 Chronic systolic (congestive) heart failure: Secondary | ICD-10-CM | POA: Diagnosis present

## 2024-12-02 DIAGNOSIS — I429 Cardiomyopathy, unspecified: Secondary | ICD-10-CM | POA: Diagnosis not present

## 2024-12-02 DIAGNOSIS — Z8674 Personal history of sudden cardiac arrest: Secondary | ICD-10-CM | POA: Insufficient documentation

## 2024-12-02 NOTE — Progress Notes (Signed)
" °  Echocardiogram 2D Echocardiogram has been performed.  Koleen KANDICE Popper, RDCS 12/02/2024, 3:58 PM "

## 2024-12-04 LAB — ECHOCARDIOGRAM COMPLETE
Area-P 1/2: 3.03 cm2
Calc EF: 60.3 %
S' Lateral: 3.8 cm
Single Plane A2C EF: 62.7 %
Single Plane A4C EF: 56.8 %

## 2024-12-12 ENCOUNTER — Telehealth: Payer: Self-pay | Admitting: Cardiovascular Disease

## 2024-12-12 NOTE — Telephone Encounter (Signed)
 Patient returned RN call

## 2024-12-13 ENCOUNTER — Telehealth (HOSPITAL_COMMUNITY): Payer: Self-pay

## 2024-12-13 NOTE — Telephone Encounter (Signed)
 Patient reports still having SOB. Patient denies Swelling or weight gain.

## 2024-12-15 ENCOUNTER — Telehealth (HOSPITAL_COMMUNITY): Payer: Self-pay

## 2024-12-15 NOTE — Telephone Encounter (Signed)
 Called to confirm/remind patient of their appointment at the [facility] on 12/16/24.   Appointment:   [x] Confirmed  [] Left mess   [] No answer/No voice mail  [] VM Full/unable to leave message  [] Phone not in service  Patient reminded to bring all medications and/or complete list.  Confirmed patient has transportation. Gave directions, instructed to utilize valet parking.

## 2024-12-15 NOTE — Telephone Encounter (Signed)
 Patient scheduled 12/15/24 10:00

## 2024-12-16 ENCOUNTER — Encounter (HOSPITAL_COMMUNITY): Payer: Self-pay

## 2024-12-16 ENCOUNTER — Ambulatory Visit (HOSPITAL_COMMUNITY): Admission: RE | Admit: 2024-12-16

## 2024-12-16 ENCOUNTER — Ambulatory Visit (HOSPITAL_COMMUNITY): Payer: Self-pay | Admitting: Family Medicine

## 2024-12-16 VITALS — BP 132/88 | HR 74 | Wt 215.2 lb

## 2024-12-16 DIAGNOSIS — I471 Supraventricular tachycardia, unspecified: Secondary | ICD-10-CM

## 2024-12-16 DIAGNOSIS — I10 Essential (primary) hypertension: Secondary | ICD-10-CM

## 2024-12-16 DIAGNOSIS — R0609 Other forms of dyspnea: Secondary | ICD-10-CM

## 2024-12-16 DIAGNOSIS — I5022 Chronic systolic (congestive) heart failure: Secondary | ICD-10-CM

## 2024-12-16 LAB — BASIC METABOLIC PANEL WITH GFR
Anion gap: 9 (ref 5–15)
BUN: 12 mg/dL (ref 6–20)
CO2: 25 mmol/L (ref 22–32)
Calcium: 10.1 mg/dL (ref 8.9–10.3)
Chloride: 104 mmol/L (ref 98–111)
Creatinine, Ser: 1.01 mg/dL — ABNORMAL HIGH (ref 0.44–1.00)
GFR, Estimated: >60 mL/min
Glucose, Bld: 82 mg/dL (ref 70–99)
Potassium: 4.1 mmol/L (ref 3.5–5.1)
Sodium: 138 mmol/L (ref 135–145)

## 2024-12-16 LAB — PRO BRAIN NATRIURETIC PEPTIDE: Pro Brain Natriuretic Peptide: 122 pg/mL

## 2024-12-16 NOTE — Patient Instructions (Addendum)
 Good to see you today!   Labs done today, your results will be available in MyChart, we will contact you for abnormal readings.  Your physician recommends that you schedule a follow-up appointment as scheduled  If you have any questions or concerns before your next appointment please send us  a message through [facility] or call our office at [phone].    TO LEAVE A MESSAGE FOR THE NURSE SELECT OPTION 2, PLEASE LEAVE A MESSAGE INCLUDING: YOUR NAME DATE OF BIRTH CALL BACK NUMBER REASON FOR CALL**this is important as we prioritize the call backs  YOU WILL RECEIVE A CALL BACK THE SAME DAY AS LONG AS YOU CALL BEFORE 4:00 PM At the [facility], you and your health needs are our priority. As part of our continuing mission to provide you with exceptional heart care, we have created designated Provider Care Teams. These Care Teams include your primary Cardiologist (physician) and Advanced Practice Providers (APPs- Physician Assistants and Nurse Practitioners) who all work together to provide you with the care you need, when you need it.   You may see any of the following providers on your designated Care Team at your next follow up: Dr Toribio Fuel Dr Ezra Shuck Dr. Morene Brownie Greig Mosses, NP Caffie Shed, [place] [facility] [place], [place] Beckey Coe, NP Jordan Lee, NP Ellouise Class, NP Tinnie Redman, PharmD Jaun Bash, PharmD   Please be sure to bring in all your medications bottles to every appointment.    Thank you for choosing [facility]

## 2024-12-16 NOTE — Progress Notes (Signed)
 "  [facility]  PCP: Leonel Cole, MD EP: Fernande Standing HF Cardiologist: Dr. Cherrie  HPI: 39 y.o. female with PMH of Crohn's disease, migraine headaches, Chronic Systolic CHF, SVT s/p ablation 06/08/15, Vtach, presumed long QT, s/p ICD implant. Sees Neurology for vertigo and headaches.  Initially she presented to [facility] ER on 08/31/14 after collapse at home in the setting of prolonged diarrhea 2/2 crohn's disease.  Arrived with K of 2.6. She was unresponsive for approx. 10-15 minutes before fire department arrived and initiated CPR for 15 minutes until EMS arrived and initiated ACLS with CPR for 15 more minutes prior to ROSC.  Pt was defibrillated 4x for vfib, and received 4 rounds of epinephrine , 150 mg amiodarone , narcan , D50, and was intubated.  10/24- rewarmed, followed commands.  She had an ECHO 08/31/14 with EF 25-30% which was thought to be secondary to her arrest, given that her repeat on 09/08/14 EF improved to 60%. Post cardiac arrest she developed sinus bradycardia and ATN.  Because of her acute kidney injury secondary to the arrest she could not undergo cardiac catheterization.  09/11/14 had epinephrine  infusion observation with rare ectopic beats and stable QT at .  Also had flecainide  brugada challenge which was negative.  Stress Myoview on 09/12/14 showed no evidence of prior infarct or ischemia, mild diffuse global hypokinesis, EF 52%.  She underwent dual chamber St Jude  ICD on 09/11/14.    12/08/2014 defibrillator discharged and she went to the ED interrogation revealed episode of polymorphic Vtach followed by SVT/atrial tach initial rate 240. EP consulted and restarted her propanolol (was stopped at outpatient visit due to dizziness) and to follow up with Dr. Fernande.  Dr. Fernande increased propanolol to 120 mg and performed SVT ablation 06/08/15.    08/2016 came to her outpatient visit with Dr. Fernande and was severely hypotensive found to have lost 24 additional pounds  related to Crohn's. No further SVT or Vtach noted.  Propanolol d/ced.  Admitted for failure to thrive 10/2016 at [facility] up to 50lb weight loss at that time started on steroids, TPN.    ECHO studies stable normal EF until 11/06/2020 where her EF dropped down to 35-40%.    We saw her for the first time in 5/22.  EF 45-50% and than symptoms related more to deconditioning than HF. Referred to our exercise physiologist who was coaching her. Participated in PT earlier this year. Discharged d/t lack of progress.   Echo 11/22 EF 55%  Had bowel resection in 3/23 at [facility]  Echo 12/23: EF 45%  Echo 11/24 EF 45-50% RV normal   Echo 1/26 EF 50-55%, RV normal  Today she returns for an acute visit with complaints of DOE x 3 weeks, feels it is progressing. Feels SOB with ADLs and housework, and walking around the house. Laying flat helps with symptoms. No fever, cough. No new meds, pets, or obvious environmental allergens. Feels occasional flutters. Struggling with anal fistulas, has occasional drainage. Chronically dizzy. Denies abnormal bleeding, CP, edema, or PND/Orthopnea. Appetite ok. Does not weigh at home. Taking all medications. Crohn's under good control, GI doc considering TNF-alpha agent.    FH:  Some Asthma in family, Grandmother, Uncle  Mother with MVP, MI at age 49  Past Medical History:  Diagnosis Date   AICD (automatic cardioverter/defibrillator) present 09/14/2015   STJ dual chamber ICD implanted by Dr Fernande for aborted cardiac arrest   Allergy    Bradycardia    Cardiac arrest (HCC) 08/31/2014  Crohn disease (HCC)    Depression    History of blood transfusion 09/2012   had allergic rxn to one of my Crohn's RX    Migraine    a few times/yr (06/08/2015)   Pancytopenia (HCC) 11/23/2012   Status post radiofrequency ablation for arrhythmia, atrial tach 06/08/15 06/08/2015   SVT (supraventricular tachycardia) 12/08/2014   likely an atrial tachycardia with CL 250 msec   Current  Outpatient Medications  Medication Sig Dispense Refill   acetaminophen  (TYLENOL ) 325 MG tablet Take 650 mg by mouth every 6 (six) hours as needed for headache.     cetirizine (ZYRTEC) 10 MG tablet Take 10 mg by mouth daily.     Cholecalciferol (VITAMIN D3) 50 MCG (2000 UT) CHEW Chew 2,000 Units by mouth daily.     Colestipol HCl (COLESTID PO) Take 2 g by mouth daily. 2-4 g daily     Cyanocobalamin (B-12) 1000 MCG SUBL Place 1,000 mcg under the tongue daily.     diazepam  (VALIUM ) 5 MG tablet Take 1 tablet (5 mg total) by mouth every 12 (twelve) hours as needed (vertigpo). 10 tablet 0   Galcanezumab -gnlm (EMGALITY ) 120 MG/ML SOAJ Inject 120 mg into the skin every 30 (thirty) days. 1 mL 6   losartan  (COZAAR ) 25 MG tablet Take 1 tablet (25 mg total) by mouth every morning AND 2 tablets (50 mg total) every evening. 270 tablet 3   ondansetron  (ZOFRAN ) 4 MG tablet Take 4 mg by mouth every 8 (eight) hours as needed for nausea or vomiting.     Pediatric Multiple Vit-C-FA (ANIMAL CHEWABLE MULTIVITAMIN PO) Take 1 tablet by mouth daily.     potassium chloride  SA (KLOR-CON  M) 20 MEQ tablet Take 1 tablet (20 mEq total) by mouth daily. 90 tablet 3   Risankizumab-rzaa (SKYRIZI) 360 MG/2.4ML SOCT Inject 360 mg into the skin every 28 (twenty-eight) days.     spironolactone  (ALDACTONE ) 25 MG tablet TAKE 1 TABLET (25 MG TOTAL) BY MOUTH DAILY. 90 tablet 1   triamcinolone ointment (KENALOG) 0.1 % Apply 1 Application topically as needed.     No current facility-administered medications for this encounter.   Allergies  Allergen Reactions   Sulfa Antibiotics Rash   Social History   Socioeconomic History   Marital status: Single    Spouse name: Not on file   Number of children: Not on file   Years of education: Not on file   Highest education level: Not on file  Occupational History   Not on file  Tobacco Use   Smoking status: Never   Smokeless tobacco: Never  Vaping Use   Vaping status: Never Used   Substance and Sexual Activity   Alcohol use: Yes    Comment: occasional/social   Drug use: No   Sexual activity: Never  Other Topics Concern   Not on file  Social History Narrative   Caffeine: sodas 2/day, some tea    Pt lives with family Pt doesn't work    Social Drivers of Health   Tobacco Use: Low Risk (11/14/2024)   Patient History    Smoking Tobacco Use: Never    Smokeless Tobacco Use: Never    Passive Exposure: Not on file  Financial Resource Strain: Not on file  Food Insecurity: Not on file  Transportation Needs: Not on file  Physical Activity: Not on file  Stress: Not on file  Social Connections: Not on file  Intimate Partner Violence: Not on file  Depression (EYV7-0): Not on file  Alcohol  Screen: Not on file  Housing: Not on file  Utilities: Not on file  Health Literacy: Not on file   Family History  Problem Relation Age of Onset   Heart attack Mother    Migraines Mother    Heart disease Mother    Heart attack Maternal Grandmother    Heart disease Maternal Grandmother    Diabetes Maternal Grandfather    Asthma Paternal Grandmother    Diabetes Paternal Grandfather    Wt Readings from Last 3 Encounters:  12/16/24 97.6 kg (215 lb 3.2 oz)  11/14/24 97.7 kg (215 lb 6.4 oz)  07/14/24 97.5 kg (215 lb)   BP 132/88   Pulse 74   Wt 97.6 kg (215 lb 3.2 oz)   SpO2 97%   BMI 35.81 kg/m   Physical Exam General:  NAD. No resp difficulty, walked into clinic HEENT: Normal Neck: Supple. No JVD. Cor: Regular rate & rhythm. No rubs, gallops or murmurs. Lungs: Clear Abdomen: Soft, obese, nontender, nondistended.  Extremities: No cyanosis, clubbing, rash, edema Neuro: Alert & oriented x 3, moves all 4 extremities w/o difficulty. Affect pleasant.  Device interrogation (personally reviewed):   ReDs reading: 31 %, normal  ASSESSMENT & PLAN:  DOE - Lungs clear on exam, no volume on board, oxygen 97% on room air. - Recent echo stable - Device interrogation w/o  fluid or arrhythmia - suspect symptoms 2/2 to ? reactive airway or asthma - Needs to follow up with PCP, we discussed she may need spirometry. - Provided reassurance.  2. 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 - Echo (09/08/14): EF improved to 60%  - Could not undergo LHC due to ATN from arrest, she had dual chamber St. Jude ICD implanted 09/13/14 - Subsequent echo studies with normal EF through 2020 low normal at 50-55%  - Echo 11/06/2020 EF read as 35-40% - EF from 12/21 Likely ~45% if not 45-50% with normal diastolic function - Echo 11/22: EF 55% - Echo 12/23: EF 45% - Echo 11/24: EF 45-50% RV normal  - Echo 1/26: EF 50-55%, normal RV - NYHA II-III, symptoms out of proportion to exam findings. Volume stable on exam, ReDs normal at 31% and on device interrogation - GDMT has been limited by dizziness. - Continue losartan  25/50 (failed Entresto  due to dizziness)  - Continue spiro 25 mg qhs. - Continue 20 KCL daily. - Not able to tolerate coreg  and propanolol in the past d/t dizziness and orthostasis - Unable to tolerate Jardiance  due to dizziness. - Labs today. - Needs to increase physical activity  3. Crohn's Disease - Now followed by [facility] - s/p bowel resection at [facility] in 3/23. - currently stable - Repeat echo 1/26 showed EF >= 40%, would be OK with NF-alpha agent, per Dr. Cherrie  4. H/o SVT - felt to be atrial tach had an ablation 06/08/15 - no recurrence on ICD  5. HTN - BP up a bit in clinic, she is anxious - Keep track of BP at home - can consider increasing losartan  to 50 bid  Keep follow up with Dr. Bensimhon, as scheduled.  Harlene HERO Cheriton, FNP  12/16/24 10:26 AM  "

## 2024-12-16 NOTE — Addendum Note (Signed)
 Encounter addended by: Dante Jeannine HERO, CMA on: 12/16/2024 11:42 AM  Actions taken: Order list changed, Diagnosis association updated, Flowsheet accepted, Clinical Note Signed, Charge Capture section accepted

## 2024-12-16 NOTE — Progress Notes (Signed)
"   ReDS Vest / Clip - 12/16/24 1100       ReDS Vest / Clip   Station Marker A    Ruler Value 27    ReDS Value Range Low volume    ReDS Actual Value 27           "

## 2025-01-08 ENCOUNTER — Encounter

## 2025-01-30 ENCOUNTER — Encounter

## 2025-03-30 ENCOUNTER — Ambulatory Visit: Admitting: Neurology

## 2025-04-09 ENCOUNTER — Encounter

## 2025-04-17 ENCOUNTER — Ambulatory Visit: Admitting: Neurology

## 2025-05-01 ENCOUNTER — Encounter

## 2025-07-09 ENCOUNTER — Encounter

## 2025-07-31 ENCOUNTER — Encounter

## 2025-10-08 ENCOUNTER — Encounter

## 2025-10-30 ENCOUNTER — Encounter

## 2026-01-07 ENCOUNTER — Encounter

## 2026-01-29 ENCOUNTER — Encounter

## 2026-04-08 ENCOUNTER — Encounter
